# Patient Record
Sex: Male | Born: 1955 | Race: Black or African American | Hispanic: No | Smoking: Current some day smoker
Health system: Southern US, Community
[De-identification: ages and names within clinical notes are randomized; demographics above are authoritative.]

## PROBLEM LIST (undated history)

## (undated) DIAGNOSIS — L309 Dermatitis, unspecified: Secondary | ICD-10-CM

## (undated) DIAGNOSIS — M545 Low back pain, unspecified: Secondary | ICD-10-CM

## (undated) DIAGNOSIS — M2550 Pain in unspecified joint: Secondary | ICD-10-CM

## (undated) DIAGNOSIS — J449 Chronic obstructive pulmonary disease, unspecified: Secondary | ICD-10-CM

## (undated) DIAGNOSIS — R202 Paresthesia of skin: Secondary | ICD-10-CM

## (undated) DIAGNOSIS — IMO0001 Reserved for inherently not codable concepts without codable children: Secondary | ICD-10-CM

## (undated) DIAGNOSIS — G8929 Other chronic pain: Secondary | ICD-10-CM

## (undated) DIAGNOSIS — S86812A Strain of other muscle(s) and tendon(s) at lower leg level, left leg, initial encounter: Secondary | ICD-10-CM

## (undated) DIAGNOSIS — M1990 Unspecified osteoarthritis, unspecified site: Secondary | ICD-10-CM

## (undated) DIAGNOSIS — Z87828 Personal history of other (healed) physical injury and trauma: Secondary | ICD-10-CM

## (undated) DIAGNOSIS — K219 Gastro-esophageal reflux disease without esophagitis: Secondary | ICD-10-CM

## (undated) DIAGNOSIS — R569 Unspecified convulsions: Secondary | ICD-10-CM

## (undated) DIAGNOSIS — E559 Vitamin D deficiency, unspecified: Secondary | ICD-10-CM

## (undated) DIAGNOSIS — Z9981 Dependence on supplemental oxygen: Secondary | ICD-10-CM

## (undated) DIAGNOSIS — I10 Essential (primary) hypertension: Secondary | ICD-10-CM

## (undated) DIAGNOSIS — N529 Male erectile dysfunction, unspecified: Secondary | ICD-10-CM

## (undated) DIAGNOSIS — M79606 Pain in leg, unspecified: Secondary | ICD-10-CM

## (undated) HISTORY — PX: LEG SURGERY: SHX1003

## (undated) HISTORY — PX: SHOULDER SURGERY: SHX246

## (undated) HISTORY — PX: OTHER SURGICAL HISTORY: SHX169

## (undated) HISTORY — PX: WISDOM TOOTH EXTRACTION: SHX21

## (undated) HISTORY — PX: PICC LINE PLACE PERIPHERAL (ARMC HX): HXRAD1248

---

## 2006-03-01 ENCOUNTER — Ambulatory Visit: Payer: Self-pay | Admitting: Family Medicine

## 2006-03-28 ENCOUNTER — Ambulatory Visit: Payer: Self-pay | Admitting: Family Medicine

## 2006-04-08 ENCOUNTER — Emergency Department (HOSPITAL_COMMUNITY): Admission: EM | Admit: 2006-04-08 | Discharge: 2006-04-08 | Payer: Self-pay | Admitting: Emergency Medicine

## 2006-06-06 ENCOUNTER — Encounter: Admission: RE | Admit: 2006-06-06 | Discharge: 2006-06-06 | Payer: Self-pay | Admitting: Family Medicine

## 2006-08-14 ENCOUNTER — Emergency Department (HOSPITAL_COMMUNITY): Admission: EM | Admit: 2006-08-14 | Discharge: 2006-08-14 | Payer: Self-pay | Admitting: Emergency Medicine

## 2006-09-29 ENCOUNTER — Emergency Department (HOSPITAL_COMMUNITY): Admission: EM | Admit: 2006-09-29 | Discharge: 2006-09-30 | Payer: Self-pay | Admitting: Emergency Medicine

## 2006-10-07 ENCOUNTER — Emergency Department (HOSPITAL_COMMUNITY): Admission: EM | Admit: 2006-10-07 | Discharge: 2006-10-07 | Payer: Self-pay | Admitting: Emergency Medicine

## 2006-10-30 ENCOUNTER — Emergency Department (HOSPITAL_COMMUNITY): Admission: EM | Admit: 2006-10-30 | Discharge: 2006-10-30 | Payer: Self-pay | Admitting: Emergency Medicine

## 2006-11-28 ENCOUNTER — Emergency Department (HOSPITAL_COMMUNITY): Admission: EM | Admit: 2006-11-28 | Discharge: 2006-11-28 | Payer: Self-pay | Admitting: Emergency Medicine

## 2007-01-10 ENCOUNTER — Ambulatory Visit: Payer: Self-pay | Admitting: Family Medicine

## 2007-03-18 ENCOUNTER — Emergency Department (HOSPITAL_COMMUNITY): Admission: EM | Admit: 2007-03-18 | Discharge: 2007-03-18 | Payer: Self-pay | Admitting: Emergency Medicine

## 2007-08-05 ENCOUNTER — Emergency Department (HOSPITAL_COMMUNITY): Admission: EM | Admit: 2007-08-05 | Discharge: 2007-08-05 | Payer: Self-pay | Admitting: Emergency Medicine

## 2008-03-03 ENCOUNTER — Emergency Department (HOSPITAL_COMMUNITY): Admission: EM | Admit: 2008-03-03 | Discharge: 2008-03-03 | Payer: Self-pay | Admitting: Emergency Medicine

## 2009-03-20 ENCOUNTER — Emergency Department (HOSPITAL_COMMUNITY): Admission: EM | Admit: 2009-03-20 | Discharge: 2009-03-20 | Payer: Self-pay | Admitting: Emergency Medicine

## 2009-04-07 ENCOUNTER — Emergency Department (HOSPITAL_COMMUNITY): Admission: EM | Admit: 2009-04-07 | Discharge: 2009-04-07 | Payer: Self-pay | Admitting: Emergency Medicine

## 2009-10-12 ENCOUNTER — Ambulatory Visit: Payer: Self-pay | Admitting: Radiology

## 2009-10-12 ENCOUNTER — Emergency Department (HOSPITAL_BASED_OUTPATIENT_CLINIC_OR_DEPARTMENT_OTHER): Admission: EM | Admit: 2009-10-12 | Discharge: 2009-10-12 | Payer: Self-pay | Admitting: Emergency Medicine

## 2010-04-30 LAB — URINALYSIS, ROUTINE W REFLEX MICROSCOPIC
Bilirubin Urine: NEGATIVE
Glucose, UA: NEGATIVE mg/dL
Hgb urine dipstick: NEGATIVE
Ketones, ur: NEGATIVE mg/dL
Nitrite: NEGATIVE
Protein, ur: NEGATIVE mg/dL
Specific Gravity, Urine: 1.016 (ref 1.005–1.030)
Urobilinogen, UA: 1 mg/dL (ref 0.0–1.0)
pH: 7.5 (ref 5.0–8.0)

## 2010-04-30 LAB — POCT CARDIAC MARKERS
CKMB, poc: 2.2 ng/mL (ref 1.0–8.0)
Myoglobin, poc: 46.9 ng/mL (ref 12–200)
Troponin i, poc: <0.05 ng/mL (ref 0.00–0.09)

## 2010-04-30 LAB — POCT TOXICOLOGY PANEL

## 2010-04-30 LAB — BASIC METABOLIC PANEL
BUN: 16 mg/dL (ref 6–23)
CO2: 26 mEq/L (ref 19–32)
Calcium: 9.8 mg/dL (ref 8.4–10.5)
Chloride: 105 mEq/L (ref 96–112)
Creatinine, Ser: 0.8 mg/dL (ref 0.4–1.5)
GFR calc Af Amer: >60 mL/min (ref >60)
GFR calc non Af Amer: >60 mL/min (ref >60)
Glucose, Bld: 89 mg/dL (ref 70–99)
Potassium: 4.7 mEq/L (ref 3.5–5.1)
Sodium: 138 mEq/L (ref 135–145)

## 2010-04-30 LAB — CBC
HCT: 43.6 % (ref 39.0–52.0)
Hemoglobin: 14.9 g/dL (ref 13.0–17.0)
MCH: 33.3 pg (ref 26.0–34.0)
MCHC: 34.2 g/dL (ref 30.0–36.0)
MCV: 97.6 fL (ref 78.0–100.0)
Platelets: 160 10*3/uL (ref 150–400)
RBC: 4.47 MIL/uL (ref 4.22–5.81)
RDW: 13.3 % (ref 11.5–15.5)
WBC: 8 10*3/uL (ref 4.0–10.5)

## 2010-04-30 LAB — PROTIME-INR
INR: 0.91 (ref 0.00–1.49)
Prothrombin Time: 12.5 seconds (ref 11.6–15.2)

## 2010-04-30 LAB — ETHANOL: Alcohol, Ethyl (B): <10 mg/dL (ref 0–10)

## 2010-04-30 LAB — APTT: aPTT: 29 seconds (ref 24–37)

## 2010-05-15 ENCOUNTER — Emergency Department (HOSPITAL_COMMUNITY)
Admission: EM | Admit: 2010-05-15 | Discharge: 2010-05-15 | Disposition: A | Discharge status: Home / Self Care | Payer: PRIVATE HEALTH INSURANCE | Attending: Emergency Medicine | Admitting: Emergency Medicine

## 2010-05-15 ENCOUNTER — Emergency Department (HOSPITAL_COMMUNITY): Payer: PRIVATE HEALTH INSURANCE

## 2010-05-15 DIAGNOSIS — R209 Unspecified disturbances of skin sensation: Secondary | ICD-10-CM | POA: Insufficient documentation

## 2010-05-15 DIAGNOSIS — Y9229 Other specified public building as the place of occurrence of the external cause: Secondary | ICD-10-CM | POA: Insufficient documentation

## 2010-05-15 DIAGNOSIS — S0100XA Unspecified open wound of scalp, initial encounter: Secondary | ICD-10-CM | POA: Insufficient documentation

## 2010-05-15 DIAGNOSIS — S0990XA Unspecified injury of head, initial encounter: Secondary | ICD-10-CM | POA: Insufficient documentation

## 2010-05-15 DIAGNOSIS — W1809XA Striking against other object with subsequent fall, initial encounter: Secondary | ICD-10-CM | POA: Insufficient documentation

## 2010-05-15 DIAGNOSIS — I10 Essential (primary) hypertension: Secondary | ICD-10-CM | POA: Insufficient documentation

## 2010-07-22 ENCOUNTER — Emergency Department (HOSPITAL_COMMUNITY)
Admission: EM | Admit: 2010-07-22 | Discharge: 2010-07-22 | Disposition: A | Discharge status: Home / Self Care | Payer: PRIVATE HEALTH INSURANCE | Attending: Emergency Medicine | Admitting: Emergency Medicine

## 2010-07-22 ENCOUNTER — Emergency Department (HOSPITAL_COMMUNITY): Payer: PRIVATE HEALTH INSURANCE

## 2010-07-22 DIAGNOSIS — R0789 Other chest pain: Secondary | ICD-10-CM | POA: Insufficient documentation

## 2010-07-22 DIAGNOSIS — F17200 Nicotine dependence, unspecified, uncomplicated: Secondary | ICD-10-CM | POA: Insufficient documentation

## 2010-07-22 DIAGNOSIS — M549 Dorsalgia, unspecified: Secondary | ICD-10-CM | POA: Insufficient documentation

## 2010-07-22 DIAGNOSIS — R05 Cough: Secondary | ICD-10-CM | POA: Insufficient documentation

## 2010-07-22 DIAGNOSIS — R0609 Other forms of dyspnea: Secondary | ICD-10-CM | POA: Insufficient documentation

## 2010-07-22 DIAGNOSIS — J45909 Unspecified asthma, uncomplicated: Secondary | ICD-10-CM | POA: Insufficient documentation

## 2010-07-22 DIAGNOSIS — R059 Cough, unspecified: Secondary | ICD-10-CM | POA: Insufficient documentation

## 2010-07-22 DIAGNOSIS — R0602 Shortness of breath: Secondary | ICD-10-CM | POA: Insufficient documentation

## 2010-07-22 DIAGNOSIS — R0989 Other specified symptoms and signs involving the circulatory and respiratory systems: Secondary | ICD-10-CM | POA: Insufficient documentation

## 2010-07-22 DIAGNOSIS — I10 Essential (primary) hypertension: Secondary | ICD-10-CM | POA: Insufficient documentation

## 2010-07-22 LAB — BASIC METABOLIC PANEL
BUN: 9 mg/dL (ref 6–23)
CO2: 26 mEq/L (ref 19–32)
Calcium: 10.1 mg/dL (ref 8.4–10.5)
Chloride: 96 mEq/L (ref 96–112)
Creatinine, Ser: 0.78 mg/dL (ref 0.4–1.5)
GFR calc Af Amer: >60 mL/min (ref >60)
GFR calc non Af Amer: >60 mL/min (ref >60)
Glucose, Bld: 104 mg/dL — ABNORMAL HIGH (ref 70–99)
Potassium: 3.6 mEq/L (ref 3.5–5.1)
Sodium: 133 mEq/L — ABNORMAL LOW (ref 135–145)

## 2010-07-22 LAB — DIFFERENTIAL
Basophils Absolute: 0 10*3/uL (ref 0.0–0.1)
Basophils Relative: 0 % (ref 0–1)
Eosinophils Absolute: 0.3 10*3/uL (ref 0.0–0.7)
Eosinophils Relative: 3 % (ref 0–5)
Lymphocytes Relative: 15 % (ref 12–46)
Lymphs Abs: 1.4 10*3/uL (ref 0.7–4.0)
Monocytes Absolute: 0.8 10*3/uL (ref 0.1–1.0)
Monocytes Relative: 8 % (ref 3–12)
Neutro Abs: 6.7 10*3/uL (ref 1.7–7.7)
Neutrophils Relative %: 73 % (ref 43–77)

## 2010-07-22 LAB — CBC
HCT: 46.5 % (ref 39.0–52.0)
Hemoglobin: 15.9 g/dL (ref 13.0–17.0)
MCH: 32.1 pg (ref 26.0–34.0)
MCHC: 34.2 g/dL (ref 30.0–36.0)
MCV: 93.9 fL (ref 78.0–100.0)
Platelets: 183 10*3/uL (ref 150–400)
RBC: 4.95 MIL/uL (ref 4.22–5.81)
RDW: 13.7 % (ref 11.5–15.5)
WBC: 9.2 10*3/uL (ref 4.0–10.5)

## 2010-07-22 LAB — POCT I-STAT 3, VENOUS BLOOD GAS (G3P V)
Acid-base deficit: 1 mmol/L (ref 0.0–2.0)
Bicarbonate: 23.8 mEq/L (ref 20.0–24.0)
O2 Saturation: 89 %
TCO2: 25 mmol/L (ref 0–100)
pCO2, Ven: 37.8 mmHg — ABNORMAL LOW (ref 45.0–50.0)
pH, Ven: 7.407 — ABNORMAL HIGH (ref 7.250–7.300)
pO2, Ven: 55 mmHg — ABNORMAL HIGH (ref 30.0–45.0)

## 2010-09-25 ENCOUNTER — Emergency Department (HOSPITAL_COMMUNITY): Payer: PRIVATE HEALTH INSURANCE

## 2010-09-25 ENCOUNTER — Emergency Department (HOSPITAL_COMMUNITY)
Admission: EM | Admit: 2010-09-25 | Discharge: 2010-09-25 | Disposition: A | Discharge status: Home / Self Care | Payer: PRIVATE HEALTH INSURANCE | Attending: Emergency Medicine | Admitting: Emergency Medicine

## 2010-09-25 DIAGNOSIS — R079 Chest pain, unspecified: Secondary | ICD-10-CM | POA: Insufficient documentation

## 2010-09-25 DIAGNOSIS — J40 Bronchitis, not specified as acute or chronic: Secondary | ICD-10-CM | POA: Insufficient documentation

## 2010-09-25 DIAGNOSIS — J45909 Unspecified asthma, uncomplicated: Secondary | ICD-10-CM | POA: Insufficient documentation

## 2010-09-25 DIAGNOSIS — R05 Cough: Secondary | ICD-10-CM | POA: Insufficient documentation

## 2010-09-25 DIAGNOSIS — M7989 Other specified soft tissue disorders: Secondary | ICD-10-CM | POA: Insufficient documentation

## 2010-09-25 DIAGNOSIS — I10 Essential (primary) hypertension: Secondary | ICD-10-CM | POA: Insufficient documentation

## 2010-09-25 DIAGNOSIS — F17200 Nicotine dependence, unspecified, uncomplicated: Secondary | ICD-10-CM | POA: Insufficient documentation

## 2010-09-25 DIAGNOSIS — R059 Cough, unspecified: Secondary | ICD-10-CM | POA: Insufficient documentation

## 2010-09-25 DIAGNOSIS — M79609 Pain in unspecified limb: Secondary | ICD-10-CM | POA: Insufficient documentation

## 2010-11-26 LAB — I-STAT 8, (EC8 V) (CONVERTED LAB)
Acid-base deficit: 3 — ABNORMAL HIGH
BUN: 9
Bicarbonate: 21.9
Chloride: 104
Glucose, Bld: 128 — ABNORMAL HIGH
HCT: 48
Hemoglobin: 16.3
Operator id: 192351
Potassium: 3.8
Sodium: 136
TCO2: 23
pCO2, Ven: 38.3 — ABNORMAL LOW
pH, Ven: 7.366 — ABNORMAL HIGH

## 2010-11-26 LAB — ETHANOL: Alcohol, Ethyl (B): 191 — ABNORMAL HIGH

## 2010-11-26 LAB — POCT I-STAT CREATININE
Creatinine, Ser: 0.8
Operator id: 192351

## 2010-12-20 ENCOUNTER — Ambulatory Visit: Payer: Medicare Other | Admitting: Physical Medicine & Rehabilitation

## 2010-12-20 ENCOUNTER — Encounter: Payer: Medicare Other | Attending: Physical Medicine & Rehabilitation

## 2011-02-15 ENCOUNTER — Encounter: Payer: Self-pay | Admitting: *Deleted

## 2011-02-15 ENCOUNTER — Emergency Department (HOSPITAL_COMMUNITY): Payer: PRIVATE HEALTH INSURANCE

## 2011-02-15 ENCOUNTER — Emergency Department (HOSPITAL_COMMUNITY)
Admission: EM | Admit: 2011-02-15 | Discharge: 2011-02-15 | Disposition: A | Discharge status: Home / Self Care | Payer: PRIVATE HEALTH INSURANCE | Attending: Emergency Medicine | Admitting: Emergency Medicine

## 2011-02-15 ENCOUNTER — Other Ambulatory Visit: Payer: Self-pay

## 2011-02-15 DIAGNOSIS — S0100XA Unspecified open wound of scalp, initial encounter: Secondary | ICD-10-CM | POA: Insufficient documentation

## 2011-02-15 DIAGNOSIS — IMO0002 Reserved for concepts with insufficient information to code with codable children: Secondary | ICD-10-CM | POA: Insufficient documentation

## 2011-02-15 DIAGNOSIS — S0101XA Laceration without foreign body of scalp, initial encounter: Secondary | ICD-10-CM

## 2011-02-15 DIAGNOSIS — F10929 Alcohol use, unspecified with intoxication, unspecified: Secondary | ICD-10-CM

## 2011-02-15 DIAGNOSIS — I498 Other specified cardiac arrhythmias: Secondary | ICD-10-CM | POA: Insufficient documentation

## 2011-02-15 DIAGNOSIS — R51 Headache: Secondary | ICD-10-CM | POA: Insufficient documentation

## 2011-02-15 DIAGNOSIS — W19XXXA Unspecified fall, initial encounter: Secondary | ICD-10-CM

## 2011-02-15 DIAGNOSIS — W108XXA Fall (on) (from) other stairs and steps, initial encounter: Secondary | ICD-10-CM | POA: Insufficient documentation

## 2011-02-15 HISTORY — DX: Other chronic pain: G89.29

## 2011-02-15 HISTORY — DX: Pain in leg, unspecified: M79.606

## 2011-02-15 LAB — URINALYSIS, ROUTINE W REFLEX MICROSCOPIC
Bilirubin Urine: NEGATIVE
Glucose, UA: NEGATIVE mg/dL
Hgb urine dipstick: NEGATIVE
Ketones, ur: NEGATIVE mg/dL
Leukocytes, UA: NEGATIVE
Nitrite: NEGATIVE
Protein, ur: NEGATIVE mg/dL
Specific Gravity, Urine: 1.012 (ref 1.005–1.030)
Urobilinogen, UA: 0.2 mg/dL (ref 0.0–1.0)
pH: 6 (ref 5.0–8.0)

## 2011-02-15 LAB — COMPREHENSIVE METABOLIC PANEL
ALT: 15 U/L (ref 0–53)
AST: 27 U/L (ref 0–37)
Albumin: 3.8 g/dL (ref 3.5–5.2)
Alkaline Phosphatase: 105 U/L (ref 39–117)
BUN: 9 mg/dL (ref 6–23)
CO2: 20 mEq/L (ref 19–32)
Calcium: 9.2 mg/dL (ref 8.4–10.5)
Chloride: 105 mEq/L (ref 96–112)
Creatinine, Ser: 0.8 mg/dL (ref 0.50–1.35)
GFR calc Af Amer: >90 mL/min (ref >90)
GFR calc non Af Amer: >90 mL/min (ref >90)
Glucose, Bld: 79 mg/dL (ref 70–99)
Potassium: 4.3 mEq/L (ref 3.5–5.1)
Sodium: 139 mEq/L (ref 135–145)
Total Bilirubin: 0.2 mg/dL — ABNORMAL LOW (ref 0.3–1.2)
Total Protein: 7.2 g/dL (ref 6.0–8.3)

## 2011-02-15 LAB — RAPID URINE DRUG SCREEN, HOSP PERFORMED
Amphetamines: NOT DETECTED
Barbiturates: NOT DETECTED
Benzodiazepines: NOT DETECTED
Cocaine: NOT DETECTED
Opiates: POSITIVE — AB
Tetrahydrocannabinol: POSITIVE — AB

## 2011-02-15 LAB — ETHANOL: Alcohol, Ethyl (B): 198 mg/dL — ABNORMAL HIGH (ref 0–11)

## 2011-02-15 MED ORDER — MORPHINE SULFATE 4 MG/ML IJ SOLN
4.0000 mg | Freq: Once | INTRAMUSCULAR | Status: AC
  Administered 2011-02-15: 4 mg via INTRAVENOUS
  Filled 2011-02-15: qty 1

## 2011-02-15 MED ORDER — HYDROCODONE-ACETAMINOPHEN 5-325 MG PO TABS: 1.0000 | ORAL_TABLET | ORAL | Status: AC | PRN

## 2011-02-15 MED ORDER — LORAZEPAM 2 MG/ML IJ SOLN
1.0000 mg | Freq: Once | INTRAMUSCULAR | Status: AC
  Administered 2011-02-15: 2 mg via INTRAVENOUS
  Filled 2011-02-15: qty 1

## 2011-02-15 MED ORDER — SODIUM CHLORIDE 0.9 % IV SOLN
INTRAVENOUS | Status: DC
  Administered 2011-02-15: 16:00:00 via INTRAVENOUS

## 2011-02-15 NOTE — ED Notes (Signed)
Returned from radiology. 

## 2011-02-15 NOTE — ED Notes (Signed)
edp at bedside suturing pt's laceration

## 2011-02-15 NOTE — ED Notes (Signed)
Patient transported to X-ray 
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
## 2011-02-15 NOTE — ED Notes (Signed)
Pt fell down stairs at apt complex. Denies loc. ETOH present. Pt presents with laceration to posterior portion of head. Bleeding controlled with gauze. Pt cooperating presently with plan of care.

## 2011-02-15 NOTE — ED Notes (Signed)
Pt ambulated the length of POD A unit gait steady with cane pt states hx of foot injury but gait was steady

## 2011-02-15 NOTE — ED Notes (Signed)
Pt fell down stairs at apt complex, denies loc. Presents with laceration to back of head. Pt smells of ETOH and admits to have been drinking since last pm.

## 2011-02-15 NOTE — ED Notes (Signed)
Patient transported to CT 

## 2011-02-15 NOTE — ED Notes (Signed)
Attempted to collect urine from patient. Patient stated he was unable to urinate at this time.

## 2011-02-15 NOTE — ED Provider Notes (Signed)
LACERATION REPAIR Performed by: Otilio Miu Authorized by: Otilio Miu Consent: Verbal consent obtained. Risks and benefits: risks, benefits and alternatives were discussed Consent given by: patient Patient identity confirmed: provided demographic data Prepped and Draped in normal sterile fashion Wound explored  Laceration Location: right occipital scalp  Laceration Length: 5cm  No Foreign Bodies seen or palpated  Anesthesia: none  Local anesthetic:none  Irrigation method: syringe Amount of cleaning: standard  Skin closure: five staples  Patient tolerance: Patient tolerated the procedure well with no immediate complications.   Arie Sabina Montura, PA 02/15/11 1930     Carleene Cooper III, MD 02/15/11 [phone]

## 2011-02-15 NOTE — ED Notes (Signed)
Suture cart at bedside 

## 2011-03-15 ENCOUNTER — Emergency Department (HOSPITAL_COMMUNITY)
Admission: EM | Admit: 2011-03-15 | Discharge: 2011-03-15 | Disposition: A | Discharge status: Home / Self Care | Payer: PRIVATE HEALTH INSURANCE | Attending: Emergency Medicine | Admitting: Emergency Medicine

## 2011-03-15 ENCOUNTER — Emergency Department (HOSPITAL_COMMUNITY): Payer: PRIVATE HEALTH INSURANCE

## 2011-03-15 ENCOUNTER — Encounter (HOSPITAL_COMMUNITY): Payer: Self-pay | Admitting: Emergency Medicine

## 2011-03-15 DIAGNOSIS — M545 Low back pain, unspecified: Secondary | ICD-10-CM | POA: Insufficient documentation

## 2011-03-15 DIAGNOSIS — W108XXA Fall (on) (from) other stairs and steps, initial encounter: Secondary | ICD-10-CM | POA: Insufficient documentation

## 2011-03-15 DIAGNOSIS — H538 Other visual disturbances: Secondary | ICD-10-CM | POA: Insufficient documentation

## 2011-03-15 DIAGNOSIS — R51 Headache: Secondary | ICD-10-CM | POA: Insufficient documentation

## 2011-03-15 HISTORY — DX: Essential (primary) hypertension: I10

## 2011-03-15 MED ORDER — HYDROCODONE-ACETAMINOPHEN 5-325 MG PO TABS: ORAL_TABLET | ORAL | Status: AC

## 2011-03-15 MED ORDER — HYDROCODONE-ACETAMINOPHEN 5-325 MG PO TABS
1.0000 | ORAL_TABLET | Freq: Once | ORAL | Status: AC
  Administered 2011-03-15: 1 via ORAL
  Filled 2011-03-15: qty 1

## 2011-03-15 NOTE — ED Provider Notes (Signed)
History     CSN: [id number]  Arrival date & time 03/15/11  2440   First MD Initiated Contact with Patient 03/15/11 [phone]      Chief Complaint  Patient presents with  . Headache  . Back Pain    (Consider location/radiation/quality/duration/timing/severity/associated sxs/prior treatment) HPI Comments: Patient presents with persistent headache and lower back pain since falling down a flight of stairs on 02/15/2011. Patient was seen in the ED at that time and had a negative head and neck CT. Patient had a laceration to the back of his head that was repaired with surgical staples. Patient continues to have headache that is improved with Vicodin. The headache is not worsening. The headache is worse with sitting and decreased with lying flat. The patient had an episode of blurry vision last night but not currently. Patient denies nausea or vomiting. Patient denies new weakness/numbness/tingling in extremities. Patient states that his gait is at baseline. Patient's back pain is in his lumbar spine. States occasional radiation into right leg. No bowel or bladder incontinence.  Patient is a 56 y.o. male presenting with headaches. The history is provided by the patient.  Headache  This is a chronic problem. The current episode started more than 1 week ago. The problem occurs constantly. The headache is associated with nothing. The pain is located in the occipital region. The quality of the pain is described as dull. The pain is mild. The pain does not radiate. Pertinent negatives include no fever, no near-syncope, no shortness of breath, no nausea and no vomiting. He has tried oral narcotic analgesics for the symptoms. The treatment provided significant relief.    Past Medical History  Diagnosis Date  . Chronic leg pain     No past surgical history on file.  No family history on file.  History  Substance Use Topics  . Smoking status: Not on file  . Smokeless tobacco: Not on file  . Alcohol  Use: Yes      Review of Systems  Constitutional: Negative for fever and fatigue.  HENT: Negative for neck pain and tinnitus.   Eyes: Positive for visual disturbance. Negative for photophobia and pain.  Respiratory: Negative for shortness of breath.   Cardiovascular: Negative for chest pain and near-syncope.  Gastrointestinal: Negative for nausea and vomiting.       Negative for fecal incontinence  Genitourinary: Negative for difficulty urinating.       Negative for urinary retention or incontinence  Musculoskeletal: Positive for back pain. Negative for gait problem.  Skin: Negative for wound.  Neurological: Positive for headaches. Negative for dizziness, weakness, light-headedness and numbness.  Psychiatric/Behavioral: Negative for confusion and decreased concentration.    Allergies  Review of patient's allergies indicates no known allergies.  Home Medications   Current Outpatient Rx  Name Route Sig Dispense Refill  . ACETAMINOPHEN 500 MG PO TABS Oral Take 1,000 mg by mouth every 6 (six) hours as needed. For pain.    Marland Kitchen HYDROCODONE-ACETAMINOPHEN 5-325 MG PO TABS Oral Take 1 tablet by mouth every 4 (four) hours as needed. For pain    . PRESCRIPTION MEDICATION Oral Take 1 tablet by mouth every morning. For blood pressure      BP 115/83  Pulse 76  Temp(Src) 97.7 F (36.5 C) (Oral)  Resp 18  SpO2 98%  Physical Exam  Nursing note and vitals reviewed. Constitutional: He is oriented to person, place, and time. He appears well-developed and well-nourished.  HENT:  Head: Normocephalic and atraumatic.  Right  Ear: External ear normal.  Left Ear: External ear normal.  Nose: Nose normal.  Mouth/Throat: Uvula is midline, oropharynx is clear and moist and mucous membranes are normal.       Well-healing laceration to occiput.  Eyes: Conjunctivae are normal. Pupils are equal, round, and reactive to light. Right eye exhibits no discharge. Left eye exhibits no discharge.  Neck: Normal  range of motion. Neck supple.       Full ROM in neck.   Cardiovascular: Normal rate, regular rhythm and normal heart sounds.   Pulmonary/Chest: Effort normal and breath sounds normal.  Abdominal: Soft. Bowel sounds are normal. There is no tenderness. There is no rebound and no guarding.  Musculoskeletal: He exhibits no edema.  Neurological: He is alert and oriented to person, place, and time. He has normal strength. No cranial nerve deficit or sensory deficit. Coordination and gait normal. GCS eye subscore is 4. GCS verbal subscore is 5. GCS motor subscore is 6.       Patient walks with limp and cane due to previous injury however it is at baseline for him. Patient has 4/5 strength in left hip and knee he states is due to previous injury. Strength at baseline.   Skin: Skin is warm and dry.  Psychiatric: He has a normal mood and affect.    ED Course  Procedures (including critical care time)  Labs Reviewed - No data to display Ct Head Wo Contrast  03/15/2011  *RADIOLOGY REPORT*  Clinical Data: Headache, back pain, vision problems, recent fall  CT HEAD WITHOUT CONTRAST  Technique:  Contiguous axial images were obtained from the base of the skull through the vertex without contrast.  Comparison: None.  Findings: No acute intracranial hemorrhage.  No focal mass lesion. No CT evidence of acute infarction.   No midline shift or mass effect.  No hydrocephalus.  Basilar cisterns are patent.  There is encephalomalacia within the paramedian frontal lobe is unchanged from prior. Paranasal sinuses and mastoid air cells are clear. Orbits are normal.  IMPRESSION:  1.  No acute intracranial findings. 2.  Remote anterior cerebral artery infarctions  Original Report Authenticated By: Genevive Bi, M.D.     1. Headache   2. Low back pain     8:21 AM Patient seen and examined.  8:21 AM Patient was discussed with and seen with Hilario Quarry, MD. Will order head CT.   10:22 AM CT scan negative. Patient  informed. Patient states that his back pain is improved after pain medicine. Patient counseled on post concussive syndrome. Urged followup with his primary care doctor in the next 3 days. Patient verbalizes understanding and agrees with plan.  Patient was counseled on back pain precautions and told to do activity as tolerated but do not lift, push, or pull heavy objects more than 10 pounds.  Patient counseled to use ice or heat on back for no longer than 15 minutes every hour.  Questions answered.  Patient verbalized understanding.    Patient counseled on use of narcotic pain medications. Counseled not to combine these medications with others containing tylenol. Urged not to drink alcohol, drive, or perform any other activities that requires focus while taking these medications. The patient verbalizes understanding and agrees with the plan.      MDM  Patient with headache and back pain after head injury 4 weeks ago. Repeat CT of head is negative for bleeding. Suspect headache may be associated with post concussive syndrome. Patient otherwise has a normal  neurological exam. Back pain is likely also related to the fall. No red flag signs and symptoms of lower back pain. Patient is ambulatory per baseline and I do not suspect any fracture. Pain control and rest indicated for back pain. Patient has PCP follow up.        Carolee Rota, [place] 03/15/11 1024
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
## 2011-03-15 NOTE — ED Notes (Signed)
Pt here c/o H/A, back pain, and (vision problems at times) since previous fall on 02-18-11.

## 2011-03-20 NOTE — ED Provider Notes (Signed)
History/physical exam/procedure(s) were performed by non-physician practitioner and as supervising physician I was immediately available for consultation/collaboration. I have reviewed all notes and am in agreement with care and plan.   Hilario Quarry, MD 03/20/11 0230

## 2011-03-30 ENCOUNTER — Encounter (HOSPITAL_COMMUNITY): Payer: Self-pay

## 2011-03-30 ENCOUNTER — Emergency Department (HOSPITAL_COMMUNITY): Payer: PRIVATE HEALTH INSURANCE

## 2011-03-30 ENCOUNTER — Emergency Department (HOSPITAL_COMMUNITY)
Admission: EM | Admit: 2011-03-30 | Discharge: 2011-03-30 | Disposition: A | Discharge status: Home / Self Care | Payer: PRIVATE HEALTH INSURANCE | Attending: Emergency Medicine | Admitting: Emergency Medicine

## 2011-03-30 DIAGNOSIS — M2540 Effusion, unspecified joint: Secondary | ICD-10-CM | POA: Insufficient documentation

## 2011-03-30 DIAGNOSIS — M545 Low back pain, unspecified: Secondary | ICD-10-CM | POA: Insufficient documentation

## 2011-03-30 DIAGNOSIS — F17200 Nicotine dependence, unspecified, uncomplicated: Secondary | ICD-10-CM | POA: Insufficient documentation

## 2011-03-30 DIAGNOSIS — Z79899 Other long term (current) drug therapy: Secondary | ICD-10-CM | POA: Insufficient documentation

## 2011-03-30 DIAGNOSIS — I10 Essential (primary) hypertension: Secondary | ICD-10-CM | POA: Insufficient documentation

## 2011-03-30 DIAGNOSIS — S93609A Unspecified sprain of unspecified foot, initial encounter: Secondary | ICD-10-CM | POA: Insufficient documentation

## 2011-03-30 DIAGNOSIS — W19XXXA Unspecified fall, initial encounter: Secondary | ICD-10-CM | POA: Insufficient documentation

## 2011-03-30 DIAGNOSIS — M7989 Other specified soft tissue disorders: Secondary | ICD-10-CM | POA: Insufficient documentation

## 2011-03-30 DIAGNOSIS — M79609 Pain in unspecified limb: Secondary | ICD-10-CM | POA: Insufficient documentation

## 2011-03-30 MED ORDER — HYDROCODONE-ACETAMINOPHEN 5-500 MG PO TABS: 1.0000 | ORAL_TABLET | Freq: Four times a day (QID) | ORAL | Status: AC | PRN

## 2011-03-30 MED ORDER — HYDROCODONE-ACETAMINOPHEN 5-325 MG PO TABS
1.0000 | ORAL_TABLET | Freq: Once | ORAL | Status: AC
  Administered 2011-03-30: 1 via ORAL
  Filled 2011-03-30: qty 1

## 2011-03-30 MED ORDER — IBUPROFEN 600 MG PO TABS: 600.0000 mg | ORAL_TABLET | Freq: Four times a day (QID) | ORAL | Status: AC | PRN

## 2011-03-30 NOTE — Progress Notes (Signed)
Orthopedic Tech Progress Note Patient Details:  [name] December 10, 1955 [id number]  Splint Location: patient refused crutches. per nurse, applied Ace Wrap to left ankle for support. Splint Interventions: Application    Gaye Pollack 03/30/2011, 10:54 AM

## 2011-03-30 NOTE — ED Notes (Addendum)
Pt presented to the ER with c/o lt foot pain. Pt claimed that he is unable to bear weight.

## 2011-03-30 NOTE — ED Notes (Signed)
Patient presents with left leg and foot pain since his fall 1 month ago.  Swelling present to lateral and anterior side of foot with strong pedal pulses present.  Patient ambulatory in department but with limp present.  Patient reports he walks a quite often for far distances.

## 2011-03-30 NOTE — Discharge Instructions (Signed)
Your x-ray today did not show any bony fractures. I suspect your pain is due to a sprain. Keep your leg elevated. Stay off of it as much as possible. Ice several times a day. Take ibuprofen for pain. Take vicodin as prescribed as needed for severe pain. Follow up with orthopedics specialist if pain continues.   Foot Sprain The muscles and cord like structures which attach muscle to bone (tendons) that surround the feet are made up of units. A foot sprain can occur at the weakest spot in any of these units. This condition is most often caused by injury to or overuse of the foot, as from playing contact sports, or aggravating a previous injury, or from poor conditioning, or obesity. SYMPTOMS  Pain with movement of the foot.   Tenderness and swelling at the injury site.   Loss of strength is present in moderate or severe sprains.  THE THREE GRADES OR SEVERITY OF FOOT SPRAIN ARE:  Mild (Grade I): Slightly pulled muscle without tearing of muscle or tendon fibers or loss of strength.   Moderate (Grade II): Tearing of fibers in a muscle, tendon, or at the attachment to bone, with small decrease in strength.   Severe (Grade III): Rupture of the muscle-tendon-bone attachment, with separation of fibers. Severe sprain requires surgical repair. Often repeating (chronic) sprains are caused by overuse. Sudden (acute) sprains are caused by direct injury or over-use.  DIAGNOSIS  Diagnosis of this condition is usually by your own observation. If problems continue, a caregiver may be required for further evaluation and treatment. X-rays may be required to make sure there are not breaks in the bones (fractures) present. Continued problems may require physical therapy for treatment. PREVENTION  Use strength and conditioning exercises appropriate for your sport.   Warm up properly prior to working out.   Use athletic shoes that are made for the sport you are participating in.   Allow adequate time for  healing. Early return to activities makes repeat injury more likely, and can lead to an unstable arthritic foot that can result in prolonged disability. Mild sprains generally heal in 3 to 10 days, with moderate and severe sprains taking 2 to 10 weeks. Your caregiver can help you determine the proper time required for healing.  HOME CARE INSTRUCTIONS   Apply ice to the injury for 15 to 20 minutes, 3 to 4 times per day. Put the ice in a plastic bag and place a towel between the bag of ice and your skin.   An elastic wrap (like an Ace bandage) may be used to keep swelling down.   Keep foot above the level of the heart, or at least raised on a footstool, when swelling and pain are present.   Try to avoid use other than gentle range of motion while the foot is painful. Do not resume use until instructed by your caregiver. Then begin use gradually, not increasing use to the point of pain. If pain does develop, decrease use and continue the above measures, gradually increasing activities that do not cause discomfort, until you gradually achieve normal use.   Use crutches if and as instructed, and for the length of time instructed.   Keep injured foot and ankle wrapped between treatments.   Massage foot and ankle for comfort and to keep swelling down. Massage from the toes up towards the knee.   Only take over-the-counter or prescription medicines for pain, discomfort, or fever as directed by your caregiver.  SEEK IMMEDIATE MEDICAL  CARE IF:   Your pain and swelling increase, or pain is not controlled with medications.   You have loss of feeling in your foot or your foot turns cold or blue.   You develop new, unexplained symptoms, or an increase of the symptoms that brought you to your caregiver.  MAKE SURE YOU:   Understand these instructions.   Will watch your condition.   Will get help right away if you are not doing well or get worse.  Document Released: 07/23/2001 Document Revised:  10/13/2010 Document Reviewed: 09/20/2007 [facility] Patient Information 2012 [place], [place].
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
## 2011-03-30 NOTE — ED Provider Notes (Signed)
Medical screening examination/treatment/procedure(s) were performed by non-physician practitioner and as supervising physician I was immediately available for consultation/collaboration.  Toy Baker, MD 03/30/11 2031

## 2011-06-15 ENCOUNTER — Emergency Department (HOSPITAL_COMMUNITY)
Admission: EM | Admit: 2011-06-15 | Discharge: 2011-06-15 | Disposition: A | Discharge status: Home / Self Care | Payer: PRIVATE HEALTH INSURANCE | Attending: Emergency Medicine | Admitting: Emergency Medicine

## 2011-06-15 ENCOUNTER — Encounter (HOSPITAL_COMMUNITY): Payer: Self-pay | Admitting: Emergency Medicine

## 2011-06-15 DIAGNOSIS — S0180XA Unspecified open wound of other part of head, initial encounter: Secondary | ICD-10-CM | POA: Insufficient documentation

## 2011-06-15 DIAGNOSIS — J45909 Unspecified asthma, uncomplicated: Secondary | ICD-10-CM | POA: Insufficient documentation

## 2011-06-15 DIAGNOSIS — S01111A Laceration without foreign body of right eyelid and periocular area, initial encounter: Secondary | ICD-10-CM

## 2011-06-15 DIAGNOSIS — I10 Essential (primary) hypertension: Secondary | ICD-10-CM | POA: Insufficient documentation

## 2011-06-15 DIAGNOSIS — M79609 Pain in unspecified limb: Secondary | ICD-10-CM | POA: Insufficient documentation

## 2011-06-15 DIAGNOSIS — F17200 Nicotine dependence, unspecified, uncomplicated: Secondary | ICD-10-CM | POA: Insufficient documentation

## 2011-06-15 DIAGNOSIS — G8929 Other chronic pain: Secondary | ICD-10-CM | POA: Insufficient documentation

## 2011-06-15 DIAGNOSIS — Z23 Encounter for immunization: Secondary | ICD-10-CM | POA: Insufficient documentation

## 2011-06-15 DIAGNOSIS — W19XXXA Unspecified fall, initial encounter: Secondary | ICD-10-CM

## 2011-06-15 DIAGNOSIS — R296 Repeated falls: Secondary | ICD-10-CM | POA: Insufficient documentation

## 2011-06-15 MED ORDER — TRAMADOL HCL 50 MG PO TABS: 50.0000 mg | ORAL_TABLET | Freq: Four times a day (QID) | ORAL | Status: AC | PRN

## 2011-06-15 MED ORDER — TETANUS-DIPHTH-ACELL PERTUSSIS 5-2.5-18.5 LF-MCG/0.5 IM SUSP
0.5000 mL | Freq: Once | INTRAMUSCULAR | Status: AC
  Administered 2011-06-15: 0.5 mL via INTRAMUSCULAR
  Filled 2011-06-15: qty 0.5

## 2011-06-15 MED ORDER — METHOCARBAMOL 500 MG PO TABS: 500.0000 mg | ORAL_TABLET | Freq: Two times a day (BID) | ORAL | Status: AC

## 2011-06-15 MED ORDER — LIDOCAINE-EPINEPHRINE (PF) 2 %-1:200000 IJ SOLN
10.0000 mL | Freq: Once | INTRAMUSCULAR | Status: AC
  Administered 2011-06-15: 10 mL
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
## 2011-06-15 NOTE — ED Notes (Signed)
C/o back pain, laceration after fall 14 hrs ago..Pt reports chronic weakness in l/leg. Pt stumbled when he stepped ito his house. He struck the r/side of his face on floor. 1.5cm laceration on outer side of r/eyebrow. Bleeding controlled. Also c/o 3 week hx of persistant moist cough. Faint end expiratory wheeze noted bilaterally

## 2011-06-15 NOTE — ED Provider Notes (Signed)
Medical screening examination/treatment/procedure(s) were performed by non-physician practitioner and as supervising physician I was immediately available for consultation/collaboration.   Celene Kras, MD 06/15/11 [phone]

## 2011-06-15 NOTE — Discharge Instructions (Signed)
Facial Laceration  A facial laceration is a cut on the face. Lacerations usually heal quickly, but they need special care to reduce scarring. It will take 1 to 2 years for the scar to lose its redness and to heal completely.  TREATMENT   Some facial lacerations may not require closure. Some lacerations may not be able to be closed due to an increased risk of infection. It is important to see your caregiver as soon as possible after an injury to minimize the risk of infection and to maximize the opportunity for successful closure.  If closure is appropriate, pain medicines may be given, if needed. The wound will be cleaned to help prevent infection. Your caregiver will use stitches (sutures), staples, wound glue (adhesive), or skin adhesive strips to repair the laceration. These tools bring the skin edges together to allow for faster healing and a better cosmetic outcome. However, all wounds will heal with a scar.   Once the wound has healed, scarring can be minimized by covering the wound with sunscreen during the day for 1 full year. Use a sunscreen with an SPF of at least 30. Sunscreen helps to reduce the pigment that will form in the scar. When applying sunscreen to a completely healed wound, massage the scar for a few minutes to help reduce the appearance of the scar. Use circular motions with your fingertips, on and around the scar. Do not massage a healing wound.  HOME CARE INSTRUCTIONS  For sutures:   Keep the wound clean and dry.   If you were given a bandage (dressing), you should change it at least once a day. Also change the dressing if it becomes wet or dirty, or as directed by your caregiver.   Wash the wound with soap and water 2 times a day. Rinse the wound off with water to remove all soap. Pat the wound dry with a clean towel.   After cleaning, apply a thin layer of the antibiotic ointment recommended by your caregiver. This will help prevent infection and keep the dressing from sticking.   You  may shower as usual after the first 24 hours. Do not soak the wound in water until the sutures are removed.   Only take over-the-counter or prescription medicines for pain, discomfort, or fever as directed by your caregiver.   Get your sutures removed as directed by your caregiver. With facial lacerations, sutures should usually be taken out after 4 to 5 days to avoid stitch marks.   Wait a few days after your sutures are removed before applying makeup.  For skin adhesive strips:   Keep the wound clean and dry.   Do not get the skin adhesive strips wet. You may bathe carefully, using caution to keep the wound dry.   If the wound gets wet, pat it dry with a clean towel.   Skin adhesive strips will fall off on their own. You may trim the strips as the wound heals. Do not remove skin adhesive strips that are still stuck to the wound. They will fall off in time.  For wound adhesive:   You may briefly wet your wound in the shower or bath. Do not soak or scrub the wound. Do not swim. Avoid periods of heavy perspiration until the skin adhesive has fallen off on its own. After showering or bathing, gently pat the wound dry with a clean towel.   Do not apply liquid medicine, cream medicine, ointment medicine, or makeup to your wound while the   skin adhesive is in place. This may loosen the film before your wound is healed.   If a dressing is placed over the wound, be careful not to apply tape directly over the skin adhesive. This may cause the adhesive to be pulled off before the wound is healed.   Avoid prolonged exposure to sunlight or tanning lamps while the skin adhesive is in place. Exposure to ultraviolet light in the first year will darken the scar.   The skin adhesive will usually remain in place for 5 to 10 days, then naturally fall off the skin. Do not pick at the adhesive film.  You may need a tetanus shot if:   You cannot remember when you had your last tetanus shot.   You have never had a tetanus  shot.  If you get a tetanus shot, your arm may swell, get red, and feel warm to the touch. This is common and not a problem. If you need a tetanus shot and you choose not to have one, there is a rare chance of getting tetanus. Sickness from tetanus can be serious.  SEEK IMMEDIATE MEDICAL CARE IF:   You develop redness, pain, or swelling around the wound.   There is yellowish-white fluid (pus) coming from the wound.   You develop chills or a fever.  MAKE SURE YOU:   Understand these instructions.   Will watch your condition.   Will get help right away if you are not doing well or get worse.  Document Released: 03/10/2004 Document Revised: 01/20/2011 Document Reviewed: 07/26/2010  [employer] Patient Information 2012 [employer].

## 2012-02-03 ENCOUNTER — Encounter (HOSPITAL_COMMUNITY): Payer: Self-pay | Admitting: Physical Medicine and Rehabilitation

## 2012-02-03 ENCOUNTER — Other Ambulatory Visit: Payer: Self-pay

## 2012-02-03 ENCOUNTER — Emergency Department (HOSPITAL_COMMUNITY)
Admission: EM | Admit: 2012-02-03 | Discharge: 2012-02-03 | Disposition: A | Discharge status: Home / Self Care | Payer: PRIVATE HEALTH INSURANCE | Attending: Emergency Medicine | Admitting: Emergency Medicine

## 2012-02-03 ENCOUNTER — Emergency Department (HOSPITAL_COMMUNITY): Payer: PRIVATE HEALTH INSURANCE

## 2012-02-03 DIAGNOSIS — R05 Cough: Secondary | ICD-10-CM | POA: Insufficient documentation

## 2012-02-03 DIAGNOSIS — M79609 Pain in unspecified limb: Secondary | ICD-10-CM | POA: Insufficient documentation

## 2012-02-03 DIAGNOSIS — I10 Essential (primary) hypertension: Secondary | ICD-10-CM | POA: Insufficient documentation

## 2012-02-03 DIAGNOSIS — G8929 Other chronic pain: Secondary | ICD-10-CM | POA: Insufficient documentation

## 2012-02-03 DIAGNOSIS — T464X5A Adverse effect of angiotensin-converting-enzyme inhibitors, initial encounter: Secondary | ICD-10-CM

## 2012-02-03 DIAGNOSIS — J45909 Unspecified asthma, uncomplicated: Secondary | ICD-10-CM | POA: Insufficient documentation

## 2012-02-03 DIAGNOSIS — R059 Cough, unspecified: Secondary | ICD-10-CM | POA: Insufficient documentation

## 2012-02-03 DIAGNOSIS — Z79899 Other long term (current) drug therapy: Secondary | ICD-10-CM | POA: Insufficient documentation

## 2012-02-03 DIAGNOSIS — F17200 Nicotine dependence, unspecified, uncomplicated: Secondary | ICD-10-CM | POA: Insufficient documentation

## 2012-02-03 DIAGNOSIS — Z87828 Personal history of other (healed) physical injury and trauma: Secondary | ICD-10-CM | POA: Insufficient documentation

## 2012-02-03 LAB — CBC WITH DIFFERENTIAL/PLATELET
Basophils Absolute: 0 10*3/uL (ref 0.0–0.1)
Basophils Relative: 1 % (ref 0–1)
Eosinophils Absolute: 0.3 10*3/uL (ref 0.0–0.7)
Eosinophils Relative: 3 % (ref 0–5)
HCT: 42.7 % (ref 39.0–52.0)
Hemoglobin: 14.1 g/dL (ref 13.0–17.0)
Lymphocytes Relative: 21 % (ref 12–46)
Lymphs Abs: 1.7 10*3/uL (ref 0.7–4.0)
MCH: 32.4 pg (ref 26.0–34.0)
MCHC: 33 g/dL (ref 30.0–36.0)
MCV: 98.2 fL (ref 78.0–100.0)
Monocytes Absolute: 0.7 10*3/uL (ref 0.1–1.0)
Monocytes Relative: 9 % (ref 3–12)
Neutro Abs: 5.3 10*3/uL (ref 1.7–7.7)
Neutrophils Relative %: 67 % (ref 43–77)
Platelets: 190 10*3/uL (ref 150–400)
RBC: 4.35 MIL/uL (ref 4.22–5.81)
RDW: 13.7 % (ref 11.5–15.5)
WBC: 7.9 10*3/uL (ref 4.0–10.5)

## 2012-02-03 LAB — BASIC METABOLIC PANEL
BUN: 8 mg/dL (ref 6–23)
CO2: 23 mEq/L (ref 19–32)
Calcium: 9.7 mg/dL (ref 8.4–10.5)
Chloride: 101 mEq/L (ref 96–112)
Creatinine, Ser: 0.69 mg/dL (ref 0.50–1.35)
GFR calc Af Amer: >90 mL/min (ref >90)
GFR calc non Af Amer: >90 mL/min (ref >90)
Glucose, Bld: 86 mg/dL (ref 70–99)
Potassium: 4.5 mEq/L (ref 3.5–5.1)
Sodium: 136 mEq/L (ref 135–145)

## 2012-02-03 MED ORDER — HYDROCHLOROTHIAZIDE 25 MG PO TABS: 25.0000 mg | ORAL_TABLET | Freq: Every day | ORAL | Status: DC

## 2012-02-03 NOTE — ED Notes (Signed)
Discharge instructions reviewed. Pt verbalized understanding.  

## 2012-02-03 NOTE — ED Notes (Signed)
Lab called to draw blood in triage.

## 2012-02-03 NOTE — ED Notes (Signed)
Pt presents to department for evaluation of diffuse chest pain, cough and thick sputum. 3/10 chest soreness at the time. States cough with thick white sputum. Pt is conscious alert and oriented x4. NAD.
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
## 2012-04-03 ENCOUNTER — Emergency Department (HOSPITAL_COMMUNITY): Payer: PRIVATE HEALTH INSURANCE

## 2012-04-03 ENCOUNTER — Emergency Department (HOSPITAL_COMMUNITY)
Admission: EM | Admit: 2012-04-03 | Discharge: 2012-04-03 | Disposition: A | Discharge status: Home / Self Care | Payer: PRIVATE HEALTH INSURANCE | Attending: Emergency Medicine | Admitting: Emergency Medicine

## 2012-04-03 ENCOUNTER — Encounter (HOSPITAL_COMMUNITY): Payer: Self-pay | Admitting: Emergency Medicine

## 2012-04-03 DIAGNOSIS — R079 Chest pain, unspecified: Secondary | ICD-10-CM | POA: Insufficient documentation

## 2012-04-03 DIAGNOSIS — Z8739 Personal history of other diseases of the musculoskeletal system and connective tissue: Secondary | ICD-10-CM | POA: Insufficient documentation

## 2012-04-03 DIAGNOSIS — J441 Chronic obstructive pulmonary disease with (acute) exacerbation: Secondary | ICD-10-CM

## 2012-04-03 DIAGNOSIS — I10 Essential (primary) hypertension: Secondary | ICD-10-CM | POA: Insufficient documentation

## 2012-04-03 DIAGNOSIS — J45901 Unspecified asthma with (acute) exacerbation: Secondary | ICD-10-CM | POA: Insufficient documentation

## 2012-04-03 DIAGNOSIS — Z79899 Other long term (current) drug therapy: Secondary | ICD-10-CM | POA: Insufficient documentation

## 2012-04-03 DIAGNOSIS — F17200 Nicotine dependence, unspecified, uncomplicated: Secondary | ICD-10-CM | POA: Insufficient documentation

## 2012-04-03 LAB — BASIC METABOLIC PANEL
BUN: 8 mg/dL (ref 6–23)
CO2: 25 mEq/L (ref 19–32)
Calcium: 9.1 mg/dL (ref 8.4–10.5)
Chloride: 103 mEq/L (ref 96–112)
Creatinine, Ser: 0.76 mg/dL (ref 0.50–1.35)
GFR calc Af Amer: >90 mL/min (ref >90)
GFR calc non Af Amer: >90 mL/min (ref >90)
Glucose, Bld: 82 mg/dL (ref 70–99)
Potassium: 4.2 mEq/L (ref 3.5–5.1)
Sodium: 137 mEq/L (ref 135–145)

## 2012-04-03 LAB — CBC
HCT: 41.3 % (ref 39.0–52.0)
Hemoglobin: 14 g/dL (ref 13.0–17.0)
MCH: 33.6 pg (ref 26.0–34.0)
MCHC: 33.9 g/dL (ref 30.0–36.0)
MCV: 99 fL (ref 78.0–100.0)
Platelets: 170 10*3/uL (ref 150–400)
RBC: 4.17 MIL/uL — ABNORMAL LOW (ref 4.22–5.81)
RDW: 13.6 % (ref 11.5–15.5)
WBC: 8.4 10*3/uL (ref 4.0–10.5)

## 2012-04-03 LAB — POCT I-STAT TROPONIN I: Troponin i, poc: 0.01 ng/mL (ref 0.00–0.08)

## 2012-04-03 MED ORDER — HYDROCODONE-ACETAMINOPHEN 5-325 MG PO TABS: 1.0000 | ORAL_TABLET | Freq: Four times a day (QID) | ORAL | Status: DC | PRN

## 2012-04-03 MED ORDER — PREDNISONE (PAK) 10 MG PO TABS: ORAL_TABLET | ORAL | Status: DC

## 2012-04-03 MED ORDER — ALBUTEROL SULFATE (5 MG/ML) 0.5% IN NEBU
10.0000 mg | INHALATION_SOLUTION | Freq: Once | RESPIRATORY_TRACT | Status: AC
  Administered 2012-04-03: 10 mg via RESPIRATORY_TRACT
  Filled 2012-04-03: qty 2

## 2012-04-03 MED ORDER — ALBUTEROL SULFATE HFA 108 (90 BASE) MCG/ACT IN AERS
1.0000 | INHALATION_SPRAY | RESPIRATORY_TRACT | Status: DC | PRN
  Administered 2012-04-03: 2 via RESPIRATORY_TRACT
  Filled 2012-04-03: qty 6.7

## 2012-04-03 MED ORDER — IPRATROPIUM BROMIDE 0.02 % IN SOLN
0.5000 mg | Freq: Once | RESPIRATORY_TRACT | Status: AC
  Administered 2012-04-03: 0.5 mg via RESPIRATORY_TRACT
  Filled 2012-04-03: qty 2.5

## 2012-04-03 MED ORDER — ALBUTEROL SULFATE (5 MG/ML) 0.5% IN NEBU
5.0000 mg | INHALATION_SOLUTION | Freq: Once | RESPIRATORY_TRACT | Status: AC
  Administered 2012-04-03: 5 mg via RESPIRATORY_TRACT
  Filled 2012-04-03: qty 1

## 2012-04-03 MED ORDER — METHYLPREDNISOLONE SODIUM SUCC 125 MG IJ SOLR
125.0000 mg | Freq: Once | INTRAMUSCULAR | Status: AC
  Administered 2012-04-03: 125 mg via INTRAVENOUS
  Filled 2012-04-03: qty 2
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
## 2012-04-03 NOTE — ED Notes (Signed)
[name], pt's counselor called to check on him.

## 2012-04-03 NOTE — ED Notes (Signed)
Pt states that been having intermittent right sided chest pain that radiates to his right shoulder blade since Sunday. Pt states that he has had cough for several months, cough sometimes productive "white color phlegm". Pt has PMH asthma.

## 2012-04-03 NOTE — ED Notes (Signed)
Patient transported to X-ray 

## 2012-04-03 NOTE — ED Notes (Signed)
Pt stated that he was hungry and would like food. RN, Lyla Son stated that he can have food when blood tests result.

## 2012-04-05 ENCOUNTER — Emergency Department (HOSPITAL_COMMUNITY): Payer: PRIVATE HEALTH INSURANCE

## 2012-04-05 ENCOUNTER — Emergency Department (HOSPITAL_COMMUNITY)
Admission: EM | Admit: 2012-04-05 | Discharge: 2012-04-05 | Disposition: A | Discharge status: Home / Self Care | Payer: PRIVATE HEALTH INSURANCE | Attending: Emergency Medicine | Admitting: Emergency Medicine

## 2012-04-05 ENCOUNTER — Encounter (HOSPITAL_COMMUNITY): Payer: Self-pay | Admitting: *Deleted

## 2012-04-05 DIAGNOSIS — R6883 Chills (without fever): Secondary | ICD-10-CM | POA: Insufficient documentation

## 2012-04-05 DIAGNOSIS — R51 Headache: Secondary | ICD-10-CM | POA: Insufficient documentation

## 2012-04-05 DIAGNOSIS — Z79899 Other long term (current) drug therapy: Secondary | ICD-10-CM | POA: Insufficient documentation

## 2012-04-05 DIAGNOSIS — R093 Abnormal sputum: Secondary | ICD-10-CM | POA: Insufficient documentation

## 2012-04-05 DIAGNOSIS — R079 Chest pain, unspecified: Secondary | ICD-10-CM | POA: Insufficient documentation

## 2012-04-05 DIAGNOSIS — R05 Cough: Secondary | ICD-10-CM

## 2012-04-05 DIAGNOSIS — J441 Chronic obstructive pulmonary disease with (acute) exacerbation: Secondary | ICD-10-CM | POA: Insufficient documentation

## 2012-04-05 DIAGNOSIS — Z87828 Personal history of other (healed) physical injury and trauma: Secondary | ICD-10-CM | POA: Insufficient documentation

## 2012-04-05 DIAGNOSIS — G8929 Other chronic pain: Secondary | ICD-10-CM | POA: Insufficient documentation

## 2012-04-05 DIAGNOSIS — R059 Cough, unspecified: Secondary | ICD-10-CM

## 2012-04-05 DIAGNOSIS — M79609 Pain in unspecified limb: Secondary | ICD-10-CM | POA: Insufficient documentation

## 2012-04-05 DIAGNOSIS — I10 Essential (primary) hypertension: Secondary | ICD-10-CM | POA: Insufficient documentation

## 2012-04-05 DIAGNOSIS — R0602 Shortness of breath: Secondary | ICD-10-CM | POA: Insufficient documentation

## 2012-04-05 DIAGNOSIS — F17200 Nicotine dependence, unspecified, uncomplicated: Secondary | ICD-10-CM | POA: Insufficient documentation

## 2012-04-05 DIAGNOSIS — R61 Generalized hyperhidrosis: Secondary | ICD-10-CM | POA: Insufficient documentation

## 2012-04-05 DIAGNOSIS — J45901 Unspecified asthma with (acute) exacerbation: Secondary | ICD-10-CM

## 2012-04-05 LAB — POCT I-STAT, CHEM 8
BUN: 6 mg/dL (ref 6–23)
Calcium, Ion: 1.15 mmol/L (ref 1.12–1.23)
Chloride: 102 mEq/L (ref 96–112)
Creatinine, Ser: 1 mg/dL (ref 0.50–1.35)
Glucose, Bld: 106 mg/dL — ABNORMAL HIGH (ref 70–99)
HCT: 45 % (ref 39.0–52.0)
Hemoglobin: 15.3 g/dL (ref 13.0–17.0)
Potassium: 3.5 mEq/L (ref 3.5–5.1)
Sodium: 136 mEq/L (ref 135–145)
TCO2: 27 mmol/L (ref 0–100)

## 2012-04-05 MED ORDER — IPRATROPIUM BROMIDE 0.02 % IN SOLN
0.5000 mg | Freq: Once | RESPIRATORY_TRACT | Status: AC
  Administered 2012-04-05: 0.5 mg via RESPIRATORY_TRACT
  Filled 2012-04-05: qty 2.5

## 2012-04-05 MED ORDER — ALBUTEROL SULFATE (5 MG/ML) 0.5% IN NEBU
2.5000 mg | INHALATION_SOLUTION | Freq: Once | RESPIRATORY_TRACT | Status: AC
  Administered 2012-04-05: 2.5 mg via RESPIRATORY_TRACT
  Filled 2012-04-05: qty 0.5

## 2012-04-05 MED ORDER — ALBUTEROL SULFATE (5 MG/ML) 0.5% IN NEBU
5.0000 mg | INHALATION_SOLUTION | Freq: Once | RESPIRATORY_TRACT | Status: AC
  Administered 2012-04-05: 5 mg via RESPIRATORY_TRACT
  Filled 2012-04-05: qty 1

## 2012-04-05 MED ORDER — PREDNISONE 20 MG PO TABS
60.0000 mg | ORAL_TABLET | Freq: Once | ORAL | Status: AC
  Administered 2012-04-05: 60 mg via ORAL
  Filled 2012-04-05: qty 3

## 2012-04-05 MED ORDER — AZITHROMYCIN 250 MG PO TABS: ORAL_TABLET | ORAL | Status: DC

## 2012-04-05 MED ORDER — PREDNISONE 20 MG PO TABS: 20.0000 mg | ORAL_TABLET | Freq: Every day | ORAL | Status: DC

## 2012-04-05 MED ORDER — ALBUTEROL SULFATE HFA 108 (90 BASE) MCG/ACT IN AERS: 2.0000 | INHALATION_SPRAY | Freq: Four times a day (QID) | RESPIRATORY_TRACT | Status: DC | PRN

## 2012-04-05 MED ORDER — ALBUTEROL SULFATE HFA 108 (90 BASE) MCG/ACT IN AERS
2.0000 | INHALATION_SPRAY | RESPIRATORY_TRACT | Status: DC
  Administered 2012-04-05: 2 via RESPIRATORY_TRACT
  Filled 2012-04-05: qty 6.7

## 2012-04-05 MED ORDER — ACETAMINOPHEN 325 MG PO TABS
325.0000 mg | ORAL_TABLET | Freq: Once | ORAL | Status: AC
  Administered 2012-04-05: 325 mg via ORAL
  Filled 2012-04-05: qty 1

## 2012-04-05 NOTE — ED Notes (Signed)
Pt escorted to discharge window. Pt verbalized understanding discharge instructions. In no acute distress.  
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
## 2012-04-05 NOTE — ED Notes (Signed)
Pt alert and oriented x4.  bilateral symmetrical rise and fall of chest. Skin warm and dry. In no acute distress. Denies needs.

## 2012-04-05 NOTE — ED Notes (Signed)
md at bedside

## 2012-04-05 NOTE — ED Notes (Signed)
EAV:[id number]<JX> Expected date:04/05/12<BR> Expected time: 7:49 AM<BR> Means of arrival:Ambulance<BR> Comments:<BR> SOB

## 2012-04-05 NOTE — ED Notes (Addendum)
EMS reports pt was seen in ED in the last week, dx with a "lung infection", pt does not think pneumonia. Pt reports he losts his prescriptions so he could not get them filled and has become worse. Productive cough, thick congestion wheezing heard in lower right lung fields. In no acute distress. 95% on room air. 5mg  albuterol given

## 2012-04-05 NOTE — ED Notes (Signed)
Pt to xray

## 2012-04-05 NOTE — ED Provider Notes (Signed)
I saw and evaluated the patient, reviewed the resident's note and I agree with the findings and plan.  The primary encounter diagnosis was SOB (shortness of breath). Diagnoses of Cough and Asthma with COPD with exacerbation were also pertinent to this visit.  Dg Chest 2 View  04/05/2012  *RADIOLOGY REPORT*  Clinical Data: Extreme shortness of breath with cough and congestion.  History of asthma, smoking and hypertension.  CHEST - 2 VIEW  Comparison: 04/03/2012 and 02/03/2012.  Findings: The heart size and mediastinal contours are stable.  The lungs are clear.  There is no pleural effusion or pneumothorax. 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  These may be post-traumatic or secondary to osteochondromas.  The distal left clavicle has been previously resected.  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  IMPRESSION: Stable examination.  No acute cardiopulmonary process.   Original Report Authenticated By: Carey Bullocks, M.D.    Dg Chest 2 View  04/03/2012  *RADIOLOGY REPORT*  Clinical Data: Chest pain and cough.  CHEST - 2 VIEW  Comparison: 02/03/2012.  Findings: The cardiac silhouette, mediastinal and hilar contours are normal and stable.  The lungs are clear.  No pleural effusion. Remote clavicle trauma is suspected.  Spur versus an osteochondroma involving the glenoid. The bony thorax is intact.  IMPRESSION: No acute cardiopulmonary findings.   Original Report Authenticated By: Rudie Meyer, M.D.   I personally reviewed the imaging tests through PACS system  I reviewed available ER/hospitalization records through the EMR  The patient feels much better after her nebulized breathing treatments.  Discharge home in good condition.  Home with steroids and azithromycin.   Lyanne Co, MD 04/05/12 1046

## 2012-04-05 NOTE — ED Notes (Signed)
RT called

## 2012-05-28 ENCOUNTER — Encounter (HOSPITAL_COMMUNITY): Payer: Self-pay | Admitting: Emergency Medicine

## 2012-05-28 ENCOUNTER — Emergency Department (HOSPITAL_COMMUNITY): Payer: PRIVATE HEALTH INSURANCE

## 2012-05-28 ENCOUNTER — Emergency Department (HOSPITAL_COMMUNITY)
Admission: EM | Admit: 2012-05-28 | Discharge: 2012-05-28 | Disposition: A | Discharge status: Home / Self Care | Payer: PRIVATE HEALTH INSURANCE | Attending: Emergency Medicine | Admitting: Emergency Medicine

## 2012-05-28 DIAGNOSIS — F17200 Nicotine dependence, unspecified, uncomplicated: Secondary | ICD-10-CM | POA: Insufficient documentation

## 2012-05-28 DIAGNOSIS — M545 Low back pain, unspecified: Secondary | ICD-10-CM

## 2012-05-28 DIAGNOSIS — IMO0002 Reserved for concepts with insufficient information to code with codable children: Secondary | ICD-10-CM | POA: Insufficient documentation

## 2012-05-28 DIAGNOSIS — I10 Essential (primary) hypertension: Secondary | ICD-10-CM | POA: Insufficient documentation

## 2012-05-28 DIAGNOSIS — Y92009 Unspecified place in unspecified non-institutional (private) residence as the place of occurrence of the external cause: Secondary | ICD-10-CM | POA: Insufficient documentation

## 2012-05-28 DIAGNOSIS — Z8739 Personal history of other diseases of the musculoskeletal system and connective tissue: Secondary | ICD-10-CM | POA: Insufficient documentation

## 2012-05-28 DIAGNOSIS — J45909 Unspecified asthma, uncomplicated: Secondary | ICD-10-CM | POA: Insufficient documentation

## 2012-05-28 DIAGNOSIS — Z79899 Other long term (current) drug therapy: Secondary | ICD-10-CM | POA: Insufficient documentation

## 2012-05-28 DIAGNOSIS — Y939 Activity, unspecified: Secondary | ICD-10-CM | POA: Insufficient documentation

## 2012-05-28 DIAGNOSIS — W108XXA Fall (on) (from) other stairs and steps, initial encounter: Secondary | ICD-10-CM | POA: Insufficient documentation

## 2012-05-28 MED ORDER — METHOCARBAMOL 500 MG PO TABS: 500.0000 mg | ORAL_TABLET | Freq: Two times a day (BID) | ORAL | Status: DC

## 2012-05-28 MED ORDER — ALBUTEROL SULFATE HFA 108 (90 BASE) MCG/ACT IN AERS
2.0000 | INHALATION_SPRAY | Freq: Four times a day (QID) | RESPIRATORY_TRACT | Status: DC | PRN
  Administered 2012-05-28: 2 via RESPIRATORY_TRACT
  Filled 2012-05-28: qty 6.7
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
## 2012-05-28 NOTE — ED Notes (Signed)
Pt c/o fall down stairs x 3 days ago; pt sts lower back pain and head pain; pt denies LOC

## 2012-05-30 NOTE — ED Provider Notes (Signed)
Medical screening examination/treatment/procedure(s) were performed by non-physician practitioner and as supervising physician I was immediately available for consultation/collaboration.   Richardean Canal, MD 05/30/12 [phone]

## 2012-07-19 ENCOUNTER — Other Ambulatory Visit: Payer: Self-pay | Admitting: Internal Medicine

## 2012-07-19 DIAGNOSIS — M545 Low back pain, unspecified: Secondary | ICD-10-CM

## 2012-08-15 ENCOUNTER — Other Ambulatory Visit: Payer: Medicare Other

## 2012-09-05 ENCOUNTER — Emergency Department (HOSPITAL_COMMUNITY)
Admission: EM | Admit: 2012-09-05 | Discharge: 2012-09-05 | Disposition: A | Discharge status: Home / Self Care | Payer: PRIVATE HEALTH INSURANCE | Attending: Emergency Medicine | Admitting: Emergency Medicine

## 2012-09-05 ENCOUNTER — Emergency Department (HOSPITAL_COMMUNITY): Payer: PRIVATE HEALTH INSURANCE

## 2012-09-05 ENCOUNTER — Encounter (HOSPITAL_COMMUNITY): Payer: Self-pay

## 2012-09-05 DIAGNOSIS — Z87828 Personal history of other (healed) physical injury and trauma: Secondary | ICD-10-CM | POA: Insufficient documentation

## 2012-09-05 DIAGNOSIS — W1809XA Striking against other object with subsequent fall, initial encounter: Secondary | ICD-10-CM | POA: Insufficient documentation

## 2012-09-05 DIAGNOSIS — M79609 Pain in unspecified limb: Secondary | ICD-10-CM | POA: Insufficient documentation

## 2012-09-05 DIAGNOSIS — F10929 Alcohol use, unspecified with intoxication, unspecified: Secondary | ICD-10-CM

## 2012-09-05 DIAGNOSIS — G8929 Other chronic pain: Secondary | ICD-10-CM | POA: Insufficient documentation

## 2012-09-05 DIAGNOSIS — S0100XA Unspecified open wound of scalp, initial encounter: Secondary | ICD-10-CM | POA: Insufficient documentation

## 2012-09-05 DIAGNOSIS — J45909 Unspecified asthma, uncomplicated: Secondary | ICD-10-CM | POA: Insufficient documentation

## 2012-09-05 DIAGNOSIS — R110 Nausea: Secondary | ICD-10-CM | POA: Insufficient documentation

## 2012-09-05 DIAGNOSIS — I10 Essential (primary) hypertension: Secondary | ICD-10-CM | POA: Insufficient documentation

## 2012-09-05 DIAGNOSIS — Y939 Activity, unspecified: Secondary | ICD-10-CM | POA: Insufficient documentation

## 2012-09-05 DIAGNOSIS — F1010 Alcohol abuse, uncomplicated: Secondary | ICD-10-CM | POA: Insufficient documentation

## 2012-09-05 DIAGNOSIS — Y929 Unspecified place or not applicable: Secondary | ICD-10-CM | POA: Insufficient documentation

## 2012-09-05 DIAGNOSIS — Z79899 Other long term (current) drug therapy: Secondary | ICD-10-CM | POA: Insufficient documentation

## 2012-09-05 DIAGNOSIS — F17200 Nicotine dependence, unspecified, uncomplicated: Secondary | ICD-10-CM | POA: Insufficient documentation

## 2012-09-05 DIAGNOSIS — W19XXXA Unspecified fall, initial encounter: Secondary | ICD-10-CM

## 2012-09-05 LAB — ETHANOL: Alcohol, Ethyl (B): 141 mg/dL — ABNORMAL HIGH (ref 0–11)

## 2012-09-05 MED ORDER — ONDANSETRON 4 MG PO TBDP
4.0000 mg | ORAL_TABLET | Freq: Once | ORAL | Status: AC
  Administered 2012-09-05: 4 mg via ORAL
  Filled 2012-09-05: qty 1

## 2012-09-05 NOTE — ED Provider Notes (Signed)
Medical screening examination/treatment/procedure(s) were performed by non-physician practitioner and as supervising physician I was immediately available for consultation/collaboration.  John-Adam Niels Cranshaw, M.D.     John-Adam Marra Fraga, MD 09/05/12 2309 
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
## 2012-09-05 NOTE — ED Notes (Signed)
Patient presents to ED via [facility]. Pt c/o of fall from standing position, hitting his head on the door frame. ETOH tonight per patient. Laceration to top of head, bleeding controlled at this time. Upon arrival to ED patient A&Ox4. Patient c/o of his head hurting 4/10. VSS.

## 2012-09-05 NOTE — ED Provider Notes (Signed)
Medical screening examination/treatment/procedure(s) were performed by non-physician practitioner and as supervising physician I was immediately available for consultation/collaboration.  Jones Skene, M.D.  Jones Skene, MD 09/05/12 [phone]
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
## 2012-09-17 ENCOUNTER — Emergency Department (HOSPITAL_COMMUNITY)
Admission: EM | Admit: 2012-09-17 | Discharge: 2012-09-17 | Disposition: A | Discharge status: Home / Self Care | Payer: PRIVATE HEALTH INSURANCE | Attending: Emergency Medicine | Admitting: Emergency Medicine

## 2012-09-17 DIAGNOSIS — M79609 Pain in unspecified limb: Secondary | ICD-10-CM | POA: Insufficient documentation

## 2012-09-17 DIAGNOSIS — G8929 Other chronic pain: Secondary | ICD-10-CM | POA: Insufficient documentation

## 2012-09-17 DIAGNOSIS — F17200 Nicotine dependence, unspecified, uncomplicated: Secondary | ICD-10-CM | POA: Insufficient documentation

## 2012-09-17 DIAGNOSIS — I10 Essential (primary) hypertension: Secondary | ICD-10-CM | POA: Insufficient documentation

## 2012-09-17 DIAGNOSIS — Z79899 Other long term (current) drug therapy: Secondary | ICD-10-CM | POA: Insufficient documentation

## 2012-09-17 DIAGNOSIS — J45909 Unspecified asthma, uncomplicated: Secondary | ICD-10-CM | POA: Insufficient documentation

## 2012-09-17 DIAGNOSIS — Z4802 Encounter for removal of sutures: Secondary | ICD-10-CM | POA: Insufficient documentation

## 2012-09-17 NOTE — ED Notes (Signed)
Pt here to have 3 staples removed from the top of his head
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
## 2012-09-20 NOTE — ED Provider Notes (Signed)
Medical screening examination/treatment/procedure(s) were performed by non-physician practitioner and as supervising physician I was immediately available for consultation/collaboration.  Candyce Churn, MD 09/20/12 0800

## 2012-11-13 ENCOUNTER — Encounter (HOSPITAL_COMMUNITY): Payer: Self-pay | Admitting: *Deleted

## 2012-11-19 ENCOUNTER — Encounter (HOSPITAL_COMMUNITY): Payer: Self-pay | Admitting: Pharmacy Technician

## 2012-12-10 ENCOUNTER — Ambulatory Visit: Payer: Self-pay | Admitting: Podiatry

## 2012-12-10 ENCOUNTER — Other Ambulatory Visit: Payer: Self-pay | Admitting: Gastroenterology

## 2012-12-10 NOTE — Addendum Note (Signed)
Addended by: Yaw Escoto on: 12/10/2012 05:06 PM   Modules accepted: Orders  

## 2012-12-11 ENCOUNTER — Encounter (HOSPITAL_COMMUNITY): Payer: Self-pay | Admitting: *Deleted

## 2012-12-11 ENCOUNTER — Encounter (HOSPITAL_COMMUNITY): Payer: PRIVATE HEALTH INSURANCE | Admitting: Certified Registered Nurse Anesthetist

## 2012-12-11 ENCOUNTER — Encounter (HOSPITAL_COMMUNITY)
Admission: RE | Disposition: A | Discharge status: Home / Self Care | Payer: Self-pay | Source: Ambulatory Visit | Attending: Gastroenterology

## 2012-12-11 ENCOUNTER — Ambulatory Visit (HOSPITAL_COMMUNITY): Payer: PRIVATE HEALTH INSURANCE | Admitting: Certified Registered Nurse Anesthetist

## 2012-12-11 ENCOUNTER — Ambulatory Visit (HOSPITAL_COMMUNITY)
Admission: RE | Admit: 2012-12-11 | Discharge: 2012-12-11 | Disposition: A | Discharge status: Home / Self Care | Payer: PRIVATE HEALTH INSURANCE | Source: Ambulatory Visit | Attending: Gastroenterology | Admitting: Gastroenterology

## 2012-12-11 DIAGNOSIS — Z79899 Other long term (current) drug therapy: Secondary | ICD-10-CM | POA: Insufficient documentation

## 2012-12-11 DIAGNOSIS — F17200 Nicotine dependence, unspecified, uncomplicated: Secondary | ICD-10-CM | POA: Insufficient documentation

## 2012-12-11 DIAGNOSIS — J449 Chronic obstructive pulmonary disease, unspecified: Secondary | ICD-10-CM | POA: Insufficient documentation

## 2012-12-11 DIAGNOSIS — J4489 Other specified chronic obstructive pulmonary disease: Secondary | ICD-10-CM | POA: Insufficient documentation

## 2012-12-11 DIAGNOSIS — K620 Anal polyp: Secondary | ICD-10-CM | POA: Insufficient documentation

## 2012-12-11 DIAGNOSIS — Z1211 Encounter for screening for malignant neoplasm of colon: Secondary | ICD-10-CM | POA: Insufficient documentation

## 2012-12-11 DIAGNOSIS — I10 Essential (primary) hypertension: Secondary | ICD-10-CM | POA: Insufficient documentation

## 2012-12-11 HISTORY — DX: Male erectile dysfunction, unspecified: N52.9

## 2012-12-11 HISTORY — DX: Vitamin D deficiency, unspecified: E55.9

## 2012-12-11 HISTORY — DX: Low back pain: M54.5

## 2012-12-11 HISTORY — DX: Low back pain, unspecified: M54.50

## 2012-12-11 HISTORY — DX: Dermatitis, unspecified: L30.9

## 2012-12-11 HISTORY — DX: Gastro-esophageal reflux disease without esophagitis: K21.9

## 2012-12-11 HISTORY — DX: Pain in unspecified joint: M25.50

## 2012-12-11 HISTORY — DX: Paresthesia of skin: R20.2

## 2012-12-11 HISTORY — DX: Personal history of other (healed) physical injury and trauma: Z87.828

## 2012-12-11 HISTORY — PX: COLONOSCOPY WITH PROPOFOL: SHX5780

## 2012-12-11 HISTORY — DX: Unspecified osteoarthritis, unspecified site: M19.90

## 2012-12-11 SURGERY — COLONOSCOPY WITH PROPOFOL
Anesthesia: Monitor Anesthesia Care

## 2012-12-11 MED ORDER — LACTATED RINGERS IV SOLN
INTRAVENOUS | Status: DC
  Administered 2012-12-11: 1000 mL via INTRAVENOUS

## 2012-12-11 MED ORDER — SODIUM CHLORIDE 0.9 % IV SOLN: INTRAVENOUS | Status: DC

## 2012-12-11 MED ORDER — LIDOCAINE HCL (CARDIAC) 20 MG/ML IV SOLN
INTRAVENOUS | Status: DC | PRN
  Administered 2012-12-11: 50 mg via INTRAVENOUS

## 2012-12-11 MED ORDER — PROPOFOL 10 MG/ML IV EMUL
INTRAVENOUS | Status: DC | PRN
  Administered 2012-12-11: 20 mg via INTRAVENOUS

## 2012-12-11 MED ORDER — GLYCOPYRROLATE 0.2 MG/ML IJ SOLN
INTRAMUSCULAR | Status: DC | PRN
  Administered 2012-12-11: 0.2 mg via INTRAVENOUS

## 2012-12-11 MED ORDER — PROPOFOL INFUSION 10 MG/ML OPTIME
INTRAVENOUS | Status: DC | PRN
  Administered 2012-12-11: 75 ug/kg/min via INTRAVENOUS

## 2012-12-11 MED ORDER — LACTATED RINGERS IV SOLN
INTRAVENOUS | Status: DC | PRN
  Administered 2012-12-11: 08:00:00 via INTRAVENOUS

## 2012-12-11 MED ORDER — KETAMINE HCL 10 MG/ML IJ SOLN
INTRAMUSCULAR | Status: DC | PRN
  Administered 2012-12-11: 20 mg via INTRAVENOUS

## 2012-12-11 SURGICAL SUPPLY — 21 items

## 2012-12-11 NOTE — Interval H&P Note (Deleted)
History and Physical Interval Note:  12/11/2012 7:54 AM  [name]  has presented today for surgery, with the diagnosis of screening  The various methods of treatment have been discussed with the patient and family. After consideration of risks, benefits and other options for treatment, the patient has consented to  Procedure(s): COLONOSCOPY WITH PROPOFOL (N/A) as a surgical intervention .  The patient's history has been reviewed, patient examined, no change in status, stable for surgery.  I have reviewed the patient's chart and labs.  Questions were answered to the patient's satisfaction.     [name] C.

## 2012-12-11 NOTE — Anesthesia Preprocedure Evaluation (Signed)
Anesthesia Evaluation  Patient identified by MRN, date of birth, ID band Patient awake    Reviewed: Allergy & Precautions, H&P , NPO status , Patient's Chart, lab work & pertinent test results  Airway Mallampati: II TM Distance: >3 FB Neck ROM: Full    Dental no notable dental hx.    Pulmonary asthma , COPDCurrent Smoker,  breath sounds clear to auscultation  Pulmonary exam normal       Cardiovascular hypertension, Pt. on medications Rhythm:Regular Rate:Normal     Neuro/Psych negative neurological ROS  negative psych ROS   GI/Hepatic negative GI ROS, Neg liver ROS,   Endo/Other  negative endocrine ROS  Renal/GU negative Renal ROS  negative genitourinary   Musculoskeletal negative musculoskeletal ROS (+)   Abdominal   Peds negative pediatric ROS (+)  Hematology negative hematology ROS (+)   Anesthesia Other Findings   Reproductive/Obstetrics negative OB ROS                           Anesthesia Physical Anesthesia Plan  ASA: II  Anesthesia Plan: MAC   Post-op Pain Management:    Induction:   Airway Management Planned:   Additional Equipment:   Intra-op Plan:   Post-operative Plan:   Informed Consent: I have reviewed the patients History and Physical, chart, labs and discussed the procedure including the risks, benefits and alternatives for the proposed anesthesia with the patient or authorized representative who has indicated his/her understanding and acceptance.   Dental advisory given  Plan Discussed with: CRNA  Anesthesia Plan Comments:         Anesthesia Quick Evaluation

## 2012-12-11 NOTE — Preoperative (Signed)
Beta Blockers   Reason not to administer Beta Blockers:Not Applicable 

## 2012-12-11 NOTE — Transfer of Care (Signed)
Immediate Anesthesia Transfer of Care Note  [name]  Procedure(s) Performed: Procedure(s): COLONOSCOPY WITH PROPOFOL (N/A)  Patient Location: Endoscopy Unit  Anesthesia Type:MAC  Level of Consciousness: sedated, patient cooperative and responds to stimulation  Airway & Oxygen Therapy: Patient Spontanous Breathing and Patient connected to face mask oxygen  Post-op Assessment: Report given to PACU RN and Post -op Vital signs reviewed and stable  Post vital signs: Reviewed and stable  Complications: No apparent anesthesia complications

## 2012-12-11 NOTE — Anesthesia Postprocedure Evaluation (Signed)
  Anesthesia Post-op Note  [name]  Procedure(s) Performed: Procedure(s) (LRB): COLONOSCOPY WITH PROPOFOL (N/A)  Patient Location: PACU  Anesthesia Type: MAC  Level of Consciousness: awake and alert   Airway and Oxygen Therapy: Patient Spontanous Breathing  Post-op Pain: mild  Post-op Assessment: Post-op Vital signs reviewed, Patient's Cardiovascular Status Stable, Respiratory Function Stable, Patent Airway and No signs of Nausea or vomiting  Last Vitals:  Filed Vitals:   12/11/12 0940  BP: 178/94  Pulse:   Temp:   Resp: 25    Post-op Vital Signs: stable   Complications: No apparent anesthesia complications

## 2012-12-11 NOTE — H&P (Addendum)
  Date of Initial H&P: 12/07/12. H and P is from 12/07/12.  History reviewed, patient examined, no change in status, stable for surgery. Screening colonoscopy.

## 2012-12-11 NOTE — H&P (Deleted)
  Date of Initial H&P: 10/07/12  History reviewed, patient examined, no change in status, stable for surgery. Screening colonoscopy.

## 2012-12-11 NOTE — Interval H&P Note (Signed)
History and Physical Interval Note:  12/11/2012 8:06 AM  [name]  has presented today for surgery, with the diagnosis of screening  The various methods of treatment have been discussed with the patient and family. After consideration of risks, benefits and other options for treatment, the patient has consented to  Procedure(s): COLONOSCOPY WITH PROPOFOL (N/A) as a surgical intervention .  The patient's history has been reviewed, patient examined, no change in status, stable for surgery.  I have reviewed the patient's chart and labs.  Questions were answered to the patient's satisfaction.     [name]

## 2012-12-11 NOTE — Op Note (Signed)
[facility] - Closed [address] [place], [address]   COLONOSCOPY PROCEDURE REPORT  [name], [name].  MR#: [id number] BIRTHDATE: 1955-07-31 , 57  yrs. old GENDER: Male ENDOSCOPIST: Charlott Rakes, MD REFERRED [id number] Concepcion Elk, M.D. PROCEDURE DATE:  12/11/2012 PROCEDURE:   Colonoscopy, screening ASA CLASS:   Class II INDICATIONS:Colorectal cancer screening. MEDICATIONS: See Anesthesia Report.  DESCRIPTION OF PROCEDURE:   After the risks benefits and alternatives of the procedure were thoroughly explained, informed consent was obtained.  The Pentax Ped Colon [id number]  endoscope was introduced through the anus and advanced to the cecum, which was identified by both the appendix and ileocecal valve , limited by No adverse events experienced.   The quality of the prep was fair except right colon was unsatisfactory. .  The instrument was then slowly withdrawn as the colon was fully examined.     FINDINGS:  Rectal exam unremarkable.  Pediatric colonoscope inserted into the colon and advanced to the cecum, where the appendiceal orifice and ileocecal valve were identified. A large amount of adherent solid and semi-solid stool seen in the proximal ascending colon and cecum that could not be irrigated away. Unsatisfactory prep in proximal right colon prevented adequate visualization of this part of the colon. The colonoscope was withdrawn and 3 small flat polyps were seen in the rectum that were removed with cold biopsy forceps. Retroflexion revealed medium-sized internal hemorrhoids.  COMPLICATIONS: None  IMPRESSION:     1. Colon polyps X 3 - s/p biopsies 2. Internal hemorrhoids 3. Poor prep of proximal colon  RECOMMENDATIONS:  F/U on path; Barium enema to be done at later date to evaluate proximal colon    ______________________________ eSigned:  Charlott Rakes, MD 12/11/2012 8:43 AM   NF:AOZHY Concepcion Elk, MD

## 2012-12-12 ENCOUNTER — Encounter (HOSPITAL_COMMUNITY): Payer: Self-pay | Admitting: Gastroenterology

## 2012-12-23 ENCOUNTER — Emergency Department (HOSPITAL_COMMUNITY): Payer: PRIVATE HEALTH INSURANCE

## 2012-12-23 ENCOUNTER — Encounter (HOSPITAL_COMMUNITY): Payer: Self-pay | Admitting: Emergency Medicine

## 2012-12-23 ENCOUNTER — Emergency Department (HOSPITAL_COMMUNITY)
Admission: EM | Admit: 2012-12-23 | Discharge: 2012-12-23 | Disposition: A | Discharge status: Home / Self Care | Payer: PRIVATE HEALTH INSURANCE | Attending: Emergency Medicine | Admitting: Emergency Medicine

## 2012-12-23 DIAGNOSIS — Z87828 Personal history of other (healed) physical injury and trauma: Secondary | ICD-10-CM | POA: Insufficient documentation

## 2012-12-23 DIAGNOSIS — Z862 Personal history of diseases of the blood and blood-forming organs and certain disorders involving the immune mechanism: Secondary | ICD-10-CM | POA: Insufficient documentation

## 2012-12-23 DIAGNOSIS — Z8639 Personal history of other endocrine, nutritional and metabolic disease: Secondary | ICD-10-CM | POA: Insufficient documentation

## 2012-12-23 DIAGNOSIS — Z872 Personal history of diseases of the skin and subcutaneous tissue: Secondary | ICD-10-CM | POA: Insufficient documentation

## 2012-12-23 DIAGNOSIS — Z87448 Personal history of other diseases of urinary system: Secondary | ICD-10-CM | POA: Insufficient documentation

## 2012-12-23 DIAGNOSIS — J45901 Unspecified asthma with (acute) exacerbation: Secondary | ICD-10-CM | POA: Insufficient documentation

## 2012-12-23 DIAGNOSIS — K219 Gastro-esophageal reflux disease without esophagitis: Secondary | ICD-10-CM | POA: Insufficient documentation

## 2012-12-23 DIAGNOSIS — G8929 Other chronic pain: Secondary | ICD-10-CM | POA: Insufficient documentation

## 2012-12-23 DIAGNOSIS — F17200 Nicotine dependence, unspecified, uncomplicated: Secondary | ICD-10-CM | POA: Insufficient documentation

## 2012-12-23 DIAGNOSIS — Z8739 Personal history of other diseases of the musculoskeletal system and connective tissue: Secondary | ICD-10-CM | POA: Insufficient documentation

## 2012-12-23 DIAGNOSIS — I10 Essential (primary) hypertension: Secondary | ICD-10-CM | POA: Insufficient documentation

## 2012-12-23 DIAGNOSIS — J40 Bronchitis, not specified as acute or chronic: Secondary | ICD-10-CM

## 2012-12-23 DIAGNOSIS — Z79899 Other long term (current) drug therapy: Secondary | ICD-10-CM | POA: Insufficient documentation

## 2012-12-23 MED ORDER — HYDROCOD POLST-CHLORPHEN POLST 10-8 MG/5ML PO LQCR
5.0000 mL | Freq: Once | ORAL | Status: AC
  Administered 2012-12-23: 5 mL via ORAL
  Filled 2012-12-23: qty 5

## 2012-12-23 MED ORDER — PREDNISONE 10 MG PO TABS: 20.0000 mg | ORAL_TABLET | Freq: Every day | ORAL | Status: DC

## 2012-12-23 MED ORDER — AZITHROMYCIN 250 MG PO TABS
500.0000 mg | ORAL_TABLET | Freq: Once | ORAL | Status: AC
Start: 2012-12-23 — End: 2012-12-23
  Administered 2012-12-23: 500 mg via ORAL
  Filled 2012-12-23: qty 2

## 2012-12-23 MED ORDER — PREDNISONE 20 MG PO TABS
60.0000 mg | ORAL_TABLET | Freq: Once | ORAL | Status: AC
  Administered 2012-12-23: 60 mg via ORAL
  Filled 2012-12-23: qty 3

## 2012-12-23 MED ORDER — ALBUTEROL SULFATE (5 MG/ML) 0.5% IN NEBU
5.0000 mg | INHALATION_SOLUTION | Freq: Once | RESPIRATORY_TRACT | Status: AC
  Administered 2012-12-23: 5 mg via RESPIRATORY_TRACT
  Filled 2012-12-23: qty 1

## 2012-12-23 MED ORDER — BENZONATATE 100 MG PO CAPS: 100.0000 mg | ORAL_CAPSULE | Freq: Three times a day (TID) | ORAL | Status: DC

## 2012-12-23 MED ORDER — AZITHROMYCIN 250 MG PO TABS: 250.0000 mg | ORAL_TABLET | Freq: Every day | ORAL | Status: DC

## 2012-12-23 NOTE — ED Notes (Signed)
Chronic cough for over a year, productive with white sputum, states he is sore from coughing
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
## 2012-12-26 NOTE — ED Provider Notes (Signed)
Medical screening examination/treatment/procedure(s) were performed by non-physician practitioner and as supervising physician I was immediately available for consultation/collaboration.  EKG Interpretation   None         Camaron Cammack, MD 12/26/12 1105 

## 2013-01-07 ENCOUNTER — Ambulatory Visit: Payer: Self-pay | Admitting: Podiatry

## 2013-02-25 ENCOUNTER — Ambulatory Visit: Payer: Medicare Other | Admitting: Podiatry

## 2013-03-04 ENCOUNTER — Ambulatory Visit: Payer: Medicare Other | Admitting: Podiatry

## 2013-03-21 ENCOUNTER — Encounter (HOSPITAL_COMMUNITY): Payer: Self-pay | Admitting: Radiology

## 2013-03-21 ENCOUNTER — Emergency Department (HOSPITAL_COMMUNITY): Payer: PRIVATE HEALTH INSURANCE

## 2013-03-21 ENCOUNTER — Emergency Department (HOSPITAL_COMMUNITY)
Admission: EM | Admit: 2013-03-21 | Discharge: 2013-03-21 | Disposition: A | Discharge status: Home / Self Care | Payer: PRIVATE HEALTH INSURANCE | Attending: Emergency Medicine | Admitting: Emergency Medicine

## 2013-03-21 DIAGNOSIS — Z87448 Personal history of other diseases of urinary system: Secondary | ICD-10-CM | POA: Diagnosis not present

## 2013-03-21 DIAGNOSIS — Y929 Unspecified place or not applicable: Secondary | ICD-10-CM | POA: Diagnosis not present

## 2013-03-21 DIAGNOSIS — F1010 Alcohol abuse, uncomplicated: Secondary | ICD-10-CM | POA: Insufficient documentation

## 2013-03-21 DIAGNOSIS — S0990XA Unspecified injury of head, initial encounter: Secondary | ICD-10-CM

## 2013-03-21 DIAGNOSIS — Y939 Activity, unspecified: Secondary | ICD-10-CM | POA: Diagnosis not present

## 2013-03-21 DIAGNOSIS — Z87828 Personal history of other (healed) physical injury and trauma: Secondary | ICD-10-CM | POA: Insufficient documentation

## 2013-03-21 DIAGNOSIS — R296 Repeated falls: Secondary | ICD-10-CM | POA: Insufficient documentation

## 2013-03-21 DIAGNOSIS — S0993XA Unspecified injury of face, initial encounter: Secondary | ICD-10-CM | POA: Diagnosis not present

## 2013-03-21 DIAGNOSIS — S199XXA Unspecified injury of neck, initial encounter: Secondary | ICD-10-CM

## 2013-03-21 DIAGNOSIS — K219 Gastro-esophageal reflux disease without esophagitis: Secondary | ICD-10-CM | POA: Diagnosis not present

## 2013-03-21 DIAGNOSIS — W19XXXA Unspecified fall, initial encounter: Secondary | ICD-10-CM

## 2013-03-21 DIAGNOSIS — F17200 Nicotine dependence, unspecified, uncomplicated: Secondary | ICD-10-CM | POA: Diagnosis not present

## 2013-03-21 DIAGNOSIS — M129 Arthropathy, unspecified: Secondary | ICD-10-CM | POA: Insufficient documentation

## 2013-03-21 DIAGNOSIS — J45909 Unspecified asthma, uncomplicated: Secondary | ICD-10-CM | POA: Diagnosis not present

## 2013-03-21 DIAGNOSIS — I10 Essential (primary) hypertension: Secondary | ICD-10-CM | POA: Diagnosis not present

## 2013-03-21 DIAGNOSIS — M549 Dorsalgia, unspecified: Secondary | ICD-10-CM | POA: Insufficient documentation

## 2013-03-21 DIAGNOSIS — Z79899 Other long term (current) drug therapy: Secondary | ICD-10-CM | POA: Insufficient documentation

## 2013-03-21 DIAGNOSIS — Z862 Personal history of diseases of the blood and blood-forming organs and certain disorders involving the immune mechanism: Secondary | ICD-10-CM | POA: Diagnosis not present

## 2013-03-21 DIAGNOSIS — R109 Unspecified abdominal pain: Secondary | ICD-10-CM | POA: Insufficient documentation

## 2013-03-21 DIAGNOSIS — Z792 Long term (current) use of antibiotics: Secondary | ICD-10-CM | POA: Diagnosis not present

## 2013-03-21 DIAGNOSIS — IMO0002 Reserved for concepts with insufficient information to code with codable children: Secondary | ICD-10-CM | POA: Diagnosis not present

## 2013-03-21 DIAGNOSIS — F10929 Alcohol use, unspecified with intoxication, unspecified: Secondary | ICD-10-CM

## 2013-03-21 DIAGNOSIS — R079 Chest pain, unspecified: Secondary | ICD-10-CM | POA: Diagnosis not present

## 2013-03-21 DIAGNOSIS — Z872 Personal history of diseases of the skin and subcutaneous tissue: Secondary | ICD-10-CM | POA: Insufficient documentation

## 2013-03-21 DIAGNOSIS — G8929 Other chronic pain: Secondary | ICD-10-CM | POA: Diagnosis not present

## 2013-03-21 DIAGNOSIS — Z8639 Personal history of other endocrine, nutritional and metabolic disease: Secondary | ICD-10-CM | POA: Insufficient documentation

## 2013-03-21 LAB — RAPID URINE DRUG SCREEN, HOSP PERFORMED
Amphetamines: NOT DETECTED
Barbiturates: NOT DETECTED
Benzodiazepines: NOT DETECTED
Cocaine: NOT DETECTED
Opiates: NOT DETECTED
Tetrahydrocannabinol: NOT DETECTED

## 2013-03-21 LAB — CBC WITH DIFFERENTIAL/PLATELET
Basophils Absolute: 0 10*3/uL (ref 0.0–0.1)
Basophils Relative: 0 % (ref 0–1)
Eosinophils Absolute: 0.4 10*3/uL (ref 0.0–0.7)
Eosinophils Relative: 3 % (ref 0–5)
HCT: 45.5 % (ref 39.0–52.0)
Hemoglobin: 15.9 g/dL (ref 13.0–17.0)
Lymphocytes Relative: 15 % (ref 12–46)
Lymphs Abs: 1.9 10*3/uL (ref 0.7–4.0)
MCH: 34 pg (ref 26.0–34.0)
MCHC: 34.9 g/dL (ref 30.0–36.0)
MCV: 97.2 fL (ref 78.0–100.0)
Monocytes Absolute: 0.7 10*3/uL (ref 0.1–1.0)
Monocytes Relative: 5 % (ref 3–12)
Neutro Abs: 9.8 10*3/uL — ABNORMAL HIGH (ref 1.7–7.7)
Neutrophils Relative %: 77 % (ref 43–77)
Platelets: 181 10*3/uL (ref 150–400)
RBC: 4.68 MIL/uL (ref 4.22–5.81)
RDW: 13.2 % (ref 11.5–15.5)
WBC: 12.8 10*3/uL — ABNORMAL HIGH (ref 4.0–10.5)

## 2013-03-21 LAB — URINALYSIS, ROUTINE W REFLEX MICROSCOPIC
Bilirubin Urine: NEGATIVE
Glucose, UA: NEGATIVE mg/dL
Hgb urine dipstick: NEGATIVE
Ketones, ur: NEGATIVE mg/dL
Leukocytes, UA: NEGATIVE
Nitrite: NEGATIVE
Protein, ur: NEGATIVE mg/dL
Specific Gravity, Urine: 1.006 (ref 1.005–1.030)
Urobilinogen, UA: 0.2 mg/dL (ref 0.0–1.0)
pH: 5.5 (ref 5.0–8.0)

## 2013-03-21 LAB — COMPREHENSIVE METABOLIC PANEL
ALT: 15 U/L (ref 0–53)
AST: 20 U/L (ref 0–37)
Albumin: 3.5 g/dL (ref 3.5–5.2)
Alkaline Phosphatase: 92 U/L (ref 39–117)
BUN: 9 mg/dL (ref 6–23)
CO2: 22 mEq/L (ref 19–32)
Calcium: 9.3 mg/dL (ref 8.4–10.5)
Chloride: 106 mEq/L (ref 96–112)
Creatinine, Ser: 0.73 mg/dL (ref 0.50–1.35)
GFR calc Af Amer: >90 mL/min (ref >90)
GFR calc non Af Amer: >90 mL/min (ref >90)
Glucose, Bld: 91 mg/dL (ref 70–99)
Potassium: 4.4 mEq/L (ref 3.7–5.3)
Sodium: 143 mEq/L (ref 137–147)
Total Bilirubin: <0.2 mg/dL — ABNORMAL LOW (ref 0.3–1.2)
Total Protein: 6.6 g/dL (ref 6.0–8.3)

## 2013-03-21 LAB — ETHANOL: Alcohol, Ethyl (B): 198 mg/dL — ABNORMAL HIGH (ref 0–11)

## 2013-03-21 MED ORDER — IOHEXOL 300 MG/ML  SOLN
100.0000 mL | Freq: Once | INTRAMUSCULAR | Status: AC | PRN
  Administered 2013-03-21: 100 mL via INTRAVENOUS

## 2013-03-21 MED ORDER — MORPHINE SULFATE 4 MG/ML IJ SOLN: 4.0000 mg | Freq: Once | INTRAMUSCULAR | Status: DC

## 2013-03-21 NOTE — ED Notes (Signed)
C Collar removed per MD Silverio LayYao verbal order

## 2013-03-21 NOTE — ED Notes (Signed)
Updated pt.s family on plan of care 

## 2013-03-21 NOTE — ED Notes (Signed)
Pt. Talking to his daughter.

## 2013-03-21 NOTE — ED Notes (Signed)
[name] pt's daughter called for discharge

## 2013-03-21 NOTE — ED Notes (Signed)
Warm blankets given.

## 2013-03-21 NOTE — ED Notes (Signed)
Report given to Brittany, RN.

## 2013-03-21 NOTE — ED Notes (Signed)
Pt. Transferred to Pod B.
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
## 2013-03-21 NOTE — Discharge Instructions (Signed)
Avoid drinking alcohol.   Take tylenol, motrin for pain.   Do not walk up stairs when you are intoxicated.   Follow up with your doctor.   Return to ER if you have severe pain, vomiting, unable to walk.

## 2013-03-28 ENCOUNTER — Ambulatory Visit: Payer: Medicare Other | Admitting: Podiatrist

## 2013-04-01 ENCOUNTER — Encounter: Payer: Self-pay | Admitting: Podiatry

## 2013-04-01 ENCOUNTER — Ambulatory Visit (INDEPENDENT_AMBULATORY_CARE_PROVIDER_SITE_OTHER): Payer: Medicare Other | Admitting: Podiatry

## 2013-04-01 VITALS — BP 152/72 | HR 87 | Resp 16

## 2013-04-01 DIAGNOSIS — Q828 Other specified congenital malformations of skin: Secondary | ICD-10-CM

## 2013-04-01 DIAGNOSIS — B351 Tinea unguium: Secondary | ICD-10-CM

## 2013-04-01 DIAGNOSIS — M79609 Pain in unspecified limb: Secondary | ICD-10-CM

## 2013-04-02 NOTE — Progress Notes (Signed)
Subjective:     Patient ID: [name], male   DOB: 09/28/1955, 58 y.o.   MRN: [id number]  HPI patient presents with thick painful nailbeds 1-5 both feet and lesions on the bottom of both feet that are very painful when pressed   Review of Systems     Objective:   Physical Exam Neurovascular status intact no health history changes noted with thick nails 1-5 both feet that are painful in 4 different lesions plantar aspect feet that are very tender when pressed    Assessment:     Mycotic nail infection with pain 1-5 both feet and lesions on both feet that are painful and thick    Plan:     Debridement painful nail bed 1-5 both feet with no iatrogenic bleeding and debridement of lesions on both feet

## 2013-07-01 ENCOUNTER — Ambulatory Visit: Payer: Medicare Other | Admitting: Podiatry

## 2013-07-03 ENCOUNTER — Ambulatory Visit: Payer: Medicaid Other | Admitting: Podiatry

## 2013-07-10 ENCOUNTER — Ambulatory Visit: Payer: Medicaid Other | Admitting: Podiatry

## 2013-07-11 ENCOUNTER — Ambulatory Visit: Payer: Medicaid Other | Admitting: Podiatry

## 2013-09-12 ENCOUNTER — Ambulatory Visit: Payer: Medicaid Other | Admitting: Podiatrist

## 2013-09-12 ENCOUNTER — Ambulatory Visit (INDEPENDENT_AMBULATORY_CARE_PROVIDER_SITE_OTHER): Payer: Medicaid Other | Admitting: Podiatrist

## 2013-09-12 DIAGNOSIS — Q828 Other specified congenital malformations of skin: Secondary | ICD-10-CM

## 2013-09-12 DIAGNOSIS — B351 Tinea unguium: Secondary | ICD-10-CM

## 2013-09-12 DIAGNOSIS — M79673 Pain in unspecified foot: Secondary | ICD-10-CM

## 2013-09-12 DIAGNOSIS — M79609 Pain in unspecified limb: Secondary | ICD-10-CM

## 2013-09-12 NOTE — Progress Notes (Signed)
   HPI:  Patient presents today for foot and nail care. Denies any new complaints today. Relates significant pain with walking and ambulatation.   Objective:  Patients chart is reviewed.  Vascular status reveals pedal pulses noted at 1 out of 4 dp and pt bilateral .  Neurological sensation is Normal to [facility] bilateral.  Patients nails are severely thickened, discolored, distrophic, friable and brittle with yellow-brown discoloration. Large porokeratotic and hyperkeratotic callouses are present submetatarsal 5 bilaterally and submetatarsal 3 right. Intact integument is noted status post debridement. Patient subjectively relates they are painful with shoes and with ambulation of bilateral feet.  Assessment:  Symptomatic onychomycosis, porokeratotic lesion x3  Plan:  Discussed treatment options and alternatives.  The symptomatic toenails and the lesions were debrided without complication.  Return appointment recommended at routine intervals of 3 months    Marlowe AschoffKathryn Anberlyn Feimster, DPM

## 2013-09-18 ENCOUNTER — Ambulatory Visit: Payer: Medicaid Other | Admitting: Podiatrist

## 2013-10-10 ENCOUNTER — Encounter (HOSPITAL_COMMUNITY): Payer: Self-pay | Admitting: Emergency Medicine

## 2013-10-10 ENCOUNTER — Emergency Department (HOSPITAL_COMMUNITY)
Admission: EM | Admit: 2013-10-10 | Discharge: 2013-10-10 | Disposition: A | Discharge status: Home / Self Care | Payer: Medicare Other | Attending: Emergency Medicine | Admitting: Emergency Medicine

## 2013-10-10 DIAGNOSIS — M549 Dorsalgia, unspecified: Secondary | ICD-10-CM

## 2013-10-10 DIAGNOSIS — J45909 Unspecified asthma, uncomplicated: Secondary | ICD-10-CM | POA: Insufficient documentation

## 2013-10-10 DIAGNOSIS — M545 Low back pain, unspecified: Secondary | ICD-10-CM | POA: Diagnosis not present

## 2013-10-10 DIAGNOSIS — I10 Essential (primary) hypertension: Secondary | ICD-10-CM | POA: Diagnosis not present

## 2013-10-10 DIAGNOSIS — Z872 Personal history of diseases of the skin and subcutaneous tissue: Secondary | ICD-10-CM | POA: Diagnosis not present

## 2013-10-10 DIAGNOSIS — Z79899 Other long term (current) drug therapy: Secondary | ICD-10-CM | POA: Diagnosis not present

## 2013-10-10 DIAGNOSIS — F17200 Nicotine dependence, unspecified, uncomplicated: Secondary | ICD-10-CM | POA: Insufficient documentation

## 2013-10-10 DIAGNOSIS — G8929 Other chronic pain: Secondary | ICD-10-CM | POA: Diagnosis not present

## 2013-10-10 DIAGNOSIS — Z760 Encounter for issue of repeat prescription: Secondary | ICD-10-CM

## 2013-10-10 DIAGNOSIS — Z87828 Personal history of other (healed) physical injury and trauma: Secondary | ICD-10-CM | POA: Insufficient documentation

## 2013-10-10 DIAGNOSIS — Z8739 Personal history of other diseases of the musculoskeletal system and connective tissue: Secondary | ICD-10-CM | POA: Insufficient documentation

## 2013-10-10 DIAGNOSIS — K219 Gastro-esophageal reflux disease without esophagitis: Secondary | ICD-10-CM | POA: Diagnosis not present

## 2013-10-10 DIAGNOSIS — Z87448 Personal history of other diseases of urinary system: Secondary | ICD-10-CM | POA: Insufficient documentation

## 2013-10-10 MED ORDER — ALBUTEROL SULFATE HFA 108 (90 BASE) MCG/ACT IN AERS: 2.0000 | INHALATION_SPRAY | RESPIRATORY_TRACT | Status: DC | PRN

## 2013-10-10 MED ORDER — HYDROCODONE-ACETAMINOPHEN 5-325 MG PO TABS
1.0000 | ORAL_TABLET | Freq: Once | ORAL | Status: AC
  Administered 2013-10-10: 1 via ORAL
  Filled 2013-10-10: qty 1

## 2013-10-10 NOTE — Discharge Instructions (Signed)
You need to make an appointment with your regular physician for refill of your chronic pain medications.    Chronic Back Pain  When back pain lasts longer than 3 months, it is called chronic back pain.People with chronic back pain often go through certain periods that are more intense (flare-ups).  CAUSES Chronic back pain can be caused by wear and tear (degeneration) on different structures in your back. These structures include:  The bones of your spine (vertebrae) and the joints surrounding your spinal cord and nerve roots (facets).  The strong, fibrous tissues that connect your vertebrae (ligaments). Degeneration of these structures may result in pressure on your nerves. This can lead to constant pain. HOME CARE INSTRUCTIONS  Avoid bending, heavy lifting, prolonged sitting, and activities which make the problem worse.  Take brief periods of rest throughout the day to reduce your pain. Lying down or standing usually is better than sitting while you are resting.  Take over-the-counter or prescription medicines only as directed by your caregiver. SEEK IMMEDIATE MEDICAL CARE IF:   You have weakness or numbness in one of your legs or feet.  You have trouble controlling your bladder or bowels.  You have nausea, vomiting, abdominal pain, shortness of breath, or fainting. Document Released: 03/10/2004 Document Revised: 04/25/2011 Document Reviewed: 01/15/2011 [facility] Patient Information 2015 [place], [place]. This information is not intended to replace advice given to you by your health care provider. Make sure you discuss any questions you have with your health care provider.  Asthma Asthma is a recurring condition in which the airways tighten and narrow. Asthma can make it difficult to breathe. It can cause coughing, wheezing, and shortness of breath. Asthma episodes, also called asthma attacks, range from minor to life-threatening. Asthma cannot be cured, but medicines and lifestyle  changes can help control it. CAUSES Asthma is believed to be caused by inherited (genetic) and environmental factors, but its exact cause is unknown. Asthma may be triggered by allergens, lung infections, or irritants in the air. Asthma triggers are different for each person. Common triggers include:   Animal dander.  Dust mites.  Cockroaches.  Pollen from trees or grass.  Mold.  Smoke.  Air pollutants such as dust, household cleaners, hair sprays, aerosol sprays, paint fumes, strong chemicals, or strong odors.  Cold air, weather changes, and winds (which increase molds and pollens in the air).  Strong emotional expressions such as crying or laughing hard.  Stress.  Certain medicines (such as aspirin) or types of drugs (such as beta-blockers).  Sulfites in foods and drinks. Foods and drinks that may contain sulfites include dried fruit, potato chips, and sparkling grape juice.  Infections or inflammatory conditions such as the flu, a cold, or an inflammation of the nasal membranes (rhinitis).  Gastroesophageal reflux disease (GERD).  Exercise or strenuous activity. SYMPTOMS Symptoms may occur immediately after asthma is triggered or many hours later. Symptoms include:  Wheezing.  Excessive nighttime or early morning coughing.  Frequent or severe coughing with a common cold.  Chest tightness.  Shortness of breath. DIAGNOSIS  The diagnosis of asthma is made by a review of your medical history and a physical exam. Tests may also be performed. These may include:  Lung function studies. These tests show how much air you breathe in and out.  Allergy tests.  Imaging tests such as X-rays. TREATMENT  Asthma cannot be cured, but it can usually be controlled. Treatment involves identifying and avoiding your asthma triggers. It also involves medicines. There  are 2 classes of medicine used for asthma treatment:   Controller medicines. These prevent asthma symptoms from  occurring. They are usually taken every day.  Reliever or rescue medicines. These quickly relieve asthma symptoms. They are used as needed and provide short-term relief. Your health care provider will help you create an asthma action plan. An asthma action plan is a written plan for managing and treating your asthma attacks. It includes a list of your asthma triggers and how they may be avoided. It also includes information on when medicines should be taken and when their dosage should be changed. An action plan may also involve the use of a device called a peak flow meter. A peak flow meter measures how well the lungs are working. It helps you monitor your condition. HOME CARE INSTRUCTIONS   Take medicines only as directed by your health care provider. Speak with your health care provider if you have questions about how or when to take the medicines.  Use a peak flow meter as directed by your health care provider. Record and keep track of readings.  Understand and use the action plan to help minimize or stop an asthma attack without needing to seek medical care.  Control your home environment in the following ways to help prevent asthma attacks:  Do not smoke. Avoid being exposed to secondhand smoke.  Change your heating and air conditioning filter regularly.  Limit your use of fireplaces and wood stoves.  Get rid of pests (such as roaches and mice) and their droppings.  Throw away plants if you see mold on them.  Clean your floors and dust regularly. Use unscented cleaning products.  Try to have someone else vacuum for you regularly. Stay out of rooms while they are being vacuumed and for a short while afterward. If you vacuum, use a dust mask from a hardware store, a double-layered or microfilter vacuum cleaner bag, or a vacuum cleaner with a HEPA filter.  Replace carpet with wood, tile, or vinyl flooring. Carpet can trap dander and dust.  Use allergy-proof pillows, mattress covers,  and box spring covers.  Wash bed sheets and blankets every week in hot water and dry them in a dryer.  Use blankets that are made of polyester or cotton.  Clean bathrooms and kitchens with bleach. If possible, have someone repaint the walls in these rooms with mold-resistant paint. Keep out of the rooms that are being cleaned and painted.  Wash hands frequently. SEEK MEDICAL CARE IF:   You have wheezing, shortness of breath, or a cough even if taking medicine to prevent attacks.  The colored mucus you cough up (sputum) is thicker than usual.  Your sputum changes from clear or white to yellow, green, gray, or bloody.  You have any problems that may be related to the medicines you are taking (such as a rash, itching, swelling, or trouble breathing).  You are using a reliever medicine more than 2-3 times per week.  Your peak flow is still at 50-79% of your personal best after following your action plan for 1 hour.  You have a fever. SEEK IMMEDIATE MEDICAL CARE IF:   You seem to be getting worse and are unresponsive to treatment during an asthma attack.  You are short of breath even at rest.  You get short of breath when doing very little physical activity.  You have difficulty eating, drinking, or talking due to asthma symptoms.  You develop chest pain.  You develop a fast heartbeat.  You have a bluish color to your lips or fingernails.  You are light-headed, dizzy, or faint.  Your peak flow is less than 50% of your personal best. MAKE SURE YOU:   Understand these instructions.  Will watch your condition.  Will get help right away if you are not doing well or get worse. Document Released: 01/31/2005 Document Revised: 06/17/2013 Document Reviewed: 08/30/2012 [facility] Patient Information 2015 [place], [place]. This information is not intended to replace advice given to you by your health care provider. Make sure you discuss any questions you have with your health care  provider.
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
## 2013-10-10 NOTE — ED Notes (Signed)
Patient states has trouble with back and has been unable to get to his doctor for medication.   Patient states he usually gets vicodin for pain from primary doctor.    Patient talking in full sentences with no difficulty.  Patient states he has trouble with asthma sometimes at night.

## 2013-10-10 NOTE — ED Provider Notes (Signed)
Medical screening examination/treatment/procedure(s) were performed by non-physician practitioner and as supervising physician I was immediately available for consultation/collaboration.  Leslee Home, M.D.   Reuben Likes, MD 10/10/13 1059

## 2013-11-16 ENCOUNTER — Emergency Department (HOSPITAL_COMMUNITY): Payer: Medicare Other

## 2013-11-16 ENCOUNTER — Emergency Department (HOSPITAL_COMMUNITY)
Admission: EM | Admit: 2013-11-16 | Discharge: 2013-11-16 | Disposition: A | Discharge status: Home / Self Care | Payer: Medicare Other | Attending: Emergency Medicine | Admitting: Emergency Medicine

## 2013-11-16 DIAGNOSIS — I10 Essential (primary) hypertension: Secondary | ICD-10-CM | POA: Insufficient documentation

## 2013-11-16 DIAGNOSIS — IMO0002 Reserved for concepts with insufficient information to code with codable children: Secondary | ICD-10-CM

## 2013-11-16 DIAGNOSIS — Y9289 Other specified places as the place of occurrence of the external cause: Secondary | ICD-10-CM | POA: Diagnosis not present

## 2013-11-16 DIAGNOSIS — Z720 Tobacco use: Secondary | ICD-10-CM | POA: Diagnosis not present

## 2013-11-16 DIAGNOSIS — J45909 Unspecified asthma, uncomplicated: Secondary | ICD-10-CM | POA: Insufficient documentation

## 2013-11-16 DIAGNOSIS — S0990XA Unspecified injury of head, initial encounter: Secondary | ICD-10-CM

## 2013-11-16 DIAGNOSIS — M1990 Unspecified osteoarthritis, unspecified site: Secondary | ICD-10-CM | POA: Diagnosis not present

## 2013-11-16 DIAGNOSIS — W01198A Fall on same level from slipping, tripping and stumbling with subsequent striking against other object, initial encounter: Secondary | ICD-10-CM | POA: Diagnosis not present

## 2013-11-16 DIAGNOSIS — Z79899 Other long term (current) drug therapy: Secondary | ICD-10-CM | POA: Diagnosis not present

## 2013-11-16 DIAGNOSIS — S0191XA Laceration without foreign body of unspecified part of head, initial encounter: Secondary | ICD-10-CM | POA: Insufficient documentation

## 2013-11-16 DIAGNOSIS — Z8719 Personal history of other diseases of the digestive system: Secondary | ICD-10-CM | POA: Diagnosis not present

## 2013-11-16 DIAGNOSIS — F10920 Alcohol use, unspecified with intoxication, uncomplicated: Secondary | ICD-10-CM

## 2013-11-16 DIAGNOSIS — Y9389 Activity, other specified: Secondary | ICD-10-CM | POA: Diagnosis not present

## 2013-11-16 DIAGNOSIS — F1010 Alcohol abuse, uncomplicated: Secondary | ICD-10-CM | POA: Insufficient documentation

## 2013-11-16 DIAGNOSIS — Z87448 Personal history of other diseases of urinary system: Secondary | ICD-10-CM | POA: Diagnosis not present

## 2013-11-16 DIAGNOSIS — Z87828 Personal history of other (healed) physical injury and trauma: Secondary | ICD-10-CM | POA: Insufficient documentation

## 2013-11-16 DIAGNOSIS — G8929 Other chronic pain: Secondary | ICD-10-CM | POA: Diagnosis not present

## 2013-11-16 DIAGNOSIS — Z8639 Personal history of other endocrine, nutritional and metabolic disease: Secondary | ICD-10-CM | POA: Diagnosis not present

## 2013-11-16 LAB — COMPREHENSIVE METABOLIC PANEL
ALT: 18 U/L (ref 0–53)
AST: 21 U/L (ref 0–37)
Albumin: 4.1 g/dL (ref 3.5–5.2)
Alkaline Phosphatase: 99 U/L (ref 39–117)
Anion gap: 17 — ABNORMAL HIGH (ref 5–15)
BUN: 10 mg/dL (ref 6–23)
CO2: 22 mEq/L (ref 19–32)
Calcium: 9.5 mg/dL (ref 8.4–10.5)
Chloride: 104 mEq/L (ref 96–112)
Creatinine, Ser: 0.67 mg/dL (ref 0.50–1.35)
GFR calc Af Amer: >90 mL/min (ref >90)
GFR calc non Af Amer: >90 mL/min (ref >90)
Glucose, Bld: 78 mg/dL (ref 70–99)
Potassium: 3.8 mEq/L (ref 3.7–5.3)
Sodium: 143 mEq/L (ref 137–147)
Total Bilirubin: <0.2 mg/dL — ABNORMAL LOW (ref 0.3–1.2)
Total Protein: 7.6 g/dL (ref 6.0–8.3)

## 2013-11-16 LAB — CBC WITH DIFFERENTIAL/PLATELET
Basophils Absolute: 0.1 10*3/uL (ref 0.0–0.1)
Basophils Relative: 1 % (ref 0–1)
Eosinophils Absolute: 0.3 10*3/uL (ref 0.0–0.7)
Eosinophils Relative: 3 % (ref 0–5)
HCT: 44.5 % (ref 39.0–52.0)
Hemoglobin: 15.2 g/dL (ref 13.0–17.0)
Lymphocytes Relative: 25 % (ref 12–46)
Lymphs Abs: 1.9 10*3/uL (ref 0.7–4.0)
MCH: 33.3 pg (ref 26.0–34.0)
MCHC: 34.2 g/dL (ref 30.0–36.0)
MCV: 97.4 fL (ref 78.0–100.0)
Monocytes Absolute: 0.5 10*3/uL (ref 0.1–1.0)
Monocytes Relative: 6 % (ref 3–12)
Neutro Abs: 5 10*3/uL (ref 1.7–7.7)
Neutrophils Relative %: 65 % (ref 43–77)
Platelets: 204 10*3/uL (ref 150–400)
RBC: 4.57 MIL/uL (ref 4.22–5.81)
RDW: 13.4 % (ref 11.5–15.5)
WBC: 7.6 10*3/uL (ref 4.0–10.5)

## 2013-11-16 LAB — PROTIME-INR
INR: 0.98 (ref 0.00–1.49)
Prothrombin Time: 13 seconds (ref 11.6–15.2)

## 2013-11-16 LAB — ETHANOL: Alcohol, Ethyl (B): 193 mg/dL — ABNORMAL HIGH (ref 0–11)

## 2013-11-16 LAB — APTT: aPTT: 30 seconds (ref 24–37)

## 2013-11-16 MED ORDER — THIAMINE HCL 100 MG/ML IJ SOLN
Freq: Once | INTRAMUSCULAR | Status: DC
  Filled 2013-11-16: qty 1000
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
## 2013-11-16 NOTE — ED Notes (Signed)
Remains in x-ray  

## 2013-11-16 NOTE — ED Notes (Signed)
Pt handed an urinal to use 
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
## 2013-11-16 NOTE — ED Notes (Signed)
Pt placed in room from hallway; myself and Adria, NT undressed pt, placed in gown, on monitor, continuous pulse oximetry and blood pressure cuff; multiple chuks placed behind his head; warm blankets given

## 2013-11-16 NOTE — ED Notes (Signed)
Pt's lunch tray was delivered; pt now getting set up to eat

## 2013-11-16 NOTE — Discharge Instructions (Signed)
Do not participate in any sports or any activities that could result in head trauma until you are cleared by your,  primary care physician or neurologist.   Keep wound dry and do not remove dressing for 24 hours if possible. After that, wash gently morning and night (every 12 hours) with soap and water. Use a topical antibiotic ointment and cover with a bandaid or gauze.    Do NOT use rubbing alcohol or hydrogen peroxide, do not soak the area   Present to your primary care doctor or the urgent care of your choice, or the ED for Staple removal in 7-10 days.   Every attempt was made to remove foreign body (contaminants) from the wound.  However, there is always a chance that some may remain in the wound. This can  increase your risk of infection.   If you see signs of infection (warmth, redness, tenderness, pus, sharp increase in pain, fever, red streaking in the skin) immediately return to the emergency department.   Please follow with your primary care doctor in the next 2 days for a check-up. They must obtain records for further management.   Do not hesitate to return to the Emergency Department for any new, worsening or concerning symptoms.     ,

## 2013-11-17 NOTE — ED Provider Notes (Signed)
Medical screening examination/treatment/procedure(s) were performed by non-physician practitioner and as supervising physician I was immediately available for consultation/collaboration.   EKG Interpretation None        Eilam Shrewsbury T Toron Bowring, MD 11/17/13 0818 

## 2013-11-21 ENCOUNTER — Emergency Department (HOSPITAL_COMMUNITY): Payer: PRIVATE HEALTH INSURANCE

## 2013-11-21 ENCOUNTER — Emergency Department (HOSPITAL_COMMUNITY)
Admission: EM | Admit: 2013-11-21 | Discharge: 2013-11-21 | Disposition: A | Discharge status: Home / Self Care | Payer: PRIVATE HEALTH INSURANCE | Attending: Emergency Medicine | Admitting: Emergency Medicine

## 2013-11-21 ENCOUNTER — Encounter (HOSPITAL_COMMUNITY): Payer: Self-pay | Admitting: Emergency Medicine

## 2013-11-21 DIAGNOSIS — Z8639 Personal history of other endocrine, nutritional and metabolic disease: Secondary | ICD-10-CM | POA: Insufficient documentation

## 2013-11-21 DIAGNOSIS — Z79899 Other long term (current) drug therapy: Secondary | ICD-10-CM | POA: Insufficient documentation

## 2013-11-21 DIAGNOSIS — Z720 Tobacco use: Secondary | ICD-10-CM | POA: Diagnosis not present

## 2013-11-21 DIAGNOSIS — J45901 Unspecified asthma with (acute) exacerbation: Secondary | ICD-10-CM | POA: Insufficient documentation

## 2013-11-21 DIAGNOSIS — G8929 Other chronic pain: Secondary | ICD-10-CM | POA: Diagnosis not present

## 2013-11-21 DIAGNOSIS — Z872 Personal history of diseases of the skin and subcutaneous tissue: Secondary | ICD-10-CM | POA: Diagnosis not present

## 2013-11-21 DIAGNOSIS — R05 Cough: Secondary | ICD-10-CM

## 2013-11-21 DIAGNOSIS — Z8719 Personal history of other diseases of the digestive system: Secondary | ICD-10-CM | POA: Insufficient documentation

## 2013-11-21 DIAGNOSIS — Z87438 Personal history of other diseases of male genital organs: Secondary | ICD-10-CM | POA: Insufficient documentation

## 2013-11-21 DIAGNOSIS — Z87828 Personal history of other (healed) physical injury and trauma: Secondary | ICD-10-CM | POA: Insufficient documentation

## 2013-11-21 DIAGNOSIS — R058 Other specified cough: Secondary | ICD-10-CM

## 2013-11-21 DIAGNOSIS — R062 Wheezing: Secondary | ICD-10-CM

## 2013-11-21 DIAGNOSIS — I10 Essential (primary) hypertension: Secondary | ICD-10-CM | POA: Insufficient documentation

## 2013-11-21 DIAGNOSIS — M1990 Unspecified osteoarthritis, unspecified site: Secondary | ICD-10-CM | POA: Diagnosis not present

## 2013-11-21 DIAGNOSIS — R0602 Shortness of breath: Secondary | ICD-10-CM | POA: Diagnosis present

## 2013-11-21 LAB — CBC WITH DIFFERENTIAL/PLATELET
Basophils Absolute: 0 10*3/uL (ref 0.0–0.1)
Basophils Relative: 1 % (ref 0–1)
Eosinophils Absolute: 0.3 10*3/uL (ref 0.0–0.7)
Eosinophils Relative: 5 % (ref 0–5)
HCT: 32.8 % — ABNORMAL LOW (ref 39.0–52.0)
Hemoglobin: 11.1 g/dL — ABNORMAL LOW (ref 13.0–17.0)
Lymphocytes Relative: 20 % (ref 12–46)
Lymphs Abs: 1.1 10*3/uL (ref 0.7–4.0)
MCH: 33.1 pg (ref 26.0–34.0)
MCHC: 33.8 g/dL (ref 30.0–36.0)
MCV: 97.9 fL (ref 78.0–100.0)
Monocytes Absolute: 0.4 10*3/uL (ref 0.1–1.0)
Monocytes Relative: 8 % (ref 3–12)
Neutro Abs: 3.9 10*3/uL (ref 1.7–7.7)
Neutrophils Relative %: 66 % (ref 43–77)
Platelets: 179 10*3/uL (ref 150–400)
RBC: 3.35 MIL/uL — ABNORMAL LOW (ref 4.22–5.81)
RDW: 13.4 % (ref 11.5–15.5)
WBC: 5.7 10*3/uL (ref 4.0–10.5)

## 2013-11-21 LAB — BASIC METABOLIC PANEL
Anion gap: 10 (ref 5–15)
BUN: 8 mg/dL (ref 6–23)
CO2: 25 mEq/L (ref 19–32)
Calcium: 9.4 mg/dL (ref 8.4–10.5)
Chloride: 104 mEq/L (ref 96–112)
Creatinine, Ser: 0.78 mg/dL (ref 0.50–1.35)
GFR calc Af Amer: >90 mL/min (ref >90)
GFR calc non Af Amer: >90 mL/min (ref >90)
Glucose, Bld: 84 mg/dL (ref 70–99)
Potassium: 4.3 mEq/L (ref 3.7–5.3)
Sodium: 139 mEq/L (ref 137–147)

## 2013-11-21 LAB — POC OCCULT BLOOD, ED: Fecal Occult Bld: NEGATIVE

## 2013-11-21 MED ORDER — ALBUTEROL SULFATE HFA 108 (90 BASE) MCG/ACT IN AERS
2.0000 | INHALATION_SPRAY | Freq: Once | RESPIRATORY_TRACT | Status: AC
  Administered 2013-11-21: 2 via RESPIRATORY_TRACT
  Filled 2013-11-21: qty 6.7

## 2013-11-21 MED ORDER — ALBUTEROL SULFATE HFA 108 (90 BASE) MCG/ACT IN AERS: 1.0000 | INHALATION_SPRAY | Freq: Four times a day (QID) | RESPIRATORY_TRACT | Status: DC | PRN

## 2013-11-21 MED ORDER — PREDNISONE 20 MG PO TABS
60.0000 mg | ORAL_TABLET | Freq: Once | ORAL | Status: AC
  Administered 2013-11-21: 60 mg via ORAL
  Filled 2013-11-21: qty 3

## 2013-11-21 MED ORDER — METHYLPREDNISOLONE SODIUM SUCC 125 MG IJ SOLR: 125.0000 mg | Freq: Once | INTRAMUSCULAR | Status: DC

## 2013-11-21 MED ORDER — IPRATROPIUM-ALBUTEROL 0.5-2.5 (3) MG/3ML IN SOLN
RESPIRATORY_TRACT | Status: AC
  Filled 2013-11-21: qty 3

## 2013-11-21 MED ORDER — IPRATROPIUM-ALBUTEROL 0.5-2.5 (3) MG/3ML IN SOLN
3.0000 mL | Freq: Once | RESPIRATORY_TRACT | Status: AC
  Administered 2013-11-21: 3 mL via RESPIRATORY_TRACT

## 2013-11-21 MED ORDER — ALBUTEROL SULFATE (2.5 MG/3ML) 0.083% IN NEBU
5.0000 mg | INHALATION_SOLUTION | Freq: Once | RESPIRATORY_TRACT | Status: AC
  Administered 2013-11-21: 5 mg via RESPIRATORY_TRACT
  Filled 2013-11-21: qty 6

## 2013-11-21 NOTE — ED Notes (Signed)
RT note: Pt. unable to generate adequate flow for reliable,"pre/post-peak flow measurements", per Asthma Protocol order, staff familiar with pt., uses Albuterol inhaler @ home, which he is currently out of, given 5 mg Albuterol per order, RT to monitor.

## 2013-11-21 NOTE — ED Provider Notes (Signed)
I saw and evaluated the patient, reviewed the resident's note and I agree with the findings and plan.   EKG Interpretation None       Briefly, pt is a 58 y.o. male presenting with dyspnea. Pt has not had his albuterol inhaler for COPD.  I performed an examination on the patient including cardiac, pulmonary, and gi systems which were significant for bil all field wheezing.   Symptoms improved with albuterol.  Pt had a significant drop in hgb, however this is after he had bleeding from his head after a recent fall.  No abd pain, or other suspected blood loss. DC home in stable condition.  Mirian MoMatthew Gentry, MD 11/21/13 [phone]
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
## 2013-11-21 NOTE — ED Notes (Signed)
Pt presents with onset of shortness of breath that woke him from sleep.  Pt reports productive cough with white phlegm, reports pain to L foot that he is supposed to get therapy on.  Pt reports he didn't have any albuterol to relieve shortness of breath. Pt denies any chest pain.

## 2013-12-09 ENCOUNTER — Ambulatory Visit: Payer: PRIVATE HEALTH INSURANCE

## 2013-12-09 ENCOUNTER — Ambulatory Visit: Payer: PRIVATE HEALTH INSURANCE | Attending: Internal Medicine | Admitting: Physical Therapy

## 2013-12-09 DIAGNOSIS — I10 Essential (primary) hypertension: Secondary | ICD-10-CM | POA: Insufficient documentation

## 2013-12-09 DIAGNOSIS — M545 Low back pain: Secondary | ICD-10-CM | POA: Diagnosis not present

## 2013-12-09 DIAGNOSIS — Z5189 Encounter for other specified aftercare: Secondary | ICD-10-CM | POA: Diagnosis not present

## 2013-12-09 DIAGNOSIS — M25572 Pain in left ankle and joints of left foot: Secondary | ICD-10-CM | POA: Insufficient documentation

## 2013-12-10 ENCOUNTER — Ambulatory Visit: Payer: PRIVATE HEALTH INSURANCE | Admitting: Physical Therapy

## 2013-12-10 DIAGNOSIS — Z5189 Encounter for other specified aftercare: Secondary | ICD-10-CM | POA: Diagnosis not present

## 2013-12-24 ENCOUNTER — Ambulatory Visit: Payer: Medicare Other | Attending: Internal Medicine | Admitting: Physical Therapy

## 2013-12-24 DIAGNOSIS — I10 Essential (primary) hypertension: Secondary | ICD-10-CM | POA: Diagnosis not present

## 2013-12-24 DIAGNOSIS — Z5189 Encounter for other specified aftercare: Secondary | ICD-10-CM | POA: Insufficient documentation

## 2013-12-24 DIAGNOSIS — M25572 Pain in left ankle and joints of left foot: Secondary | ICD-10-CM | POA: Insufficient documentation

## 2013-12-24 DIAGNOSIS — M545 Low back pain: Secondary | ICD-10-CM | POA: Diagnosis not present

## 2013-12-24 DIAGNOSIS — M5442 Lumbago with sciatica, left side: Secondary | ICD-10-CM
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
## 2013-12-26 ENCOUNTER — Ambulatory Visit: Payer: Medicare Other | Admitting: Physical Therapy

## 2013-12-26 ENCOUNTER — Encounter: Payer: Self-pay | Admitting: Physical Therapy

## 2013-12-26 DIAGNOSIS — M5442 Lumbago with sciatica, left side: Secondary | ICD-10-CM

## 2013-12-26 DIAGNOSIS — Z5189 Encounter for other specified aftercare: Secondary | ICD-10-CM | POA: Diagnosis not present
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
## 2013-12-26 NOTE — Patient Instructions (Signed)
Thoracolumbar Side-Bend: Bolster (Side-Lying)   Lie on right side, bolster at hip crest level. Support neck with lower arm, top arm slack above head. Drop your TOP leg back off the bed.  Hold __60__ seconds. Relax. Repeat __3 times per set. Do ___1_ sets per session. Do ____2 sessions per day.  http://orth.exer.us/1014   Copyright  VHI. All rights reserved.

## 2013-12-31 ENCOUNTER — Telehealth: Payer: Self-pay | Admitting: *Deleted

## 2013-12-31 ENCOUNTER — Ambulatory Visit: Payer: Medicare Other | Admitting: Physical Therapy

## 2013-12-31 DIAGNOSIS — Z5189 Encounter for other specified aftercare: Secondary | ICD-10-CM | POA: Diagnosis not present

## 2013-12-31 DIAGNOSIS — M5442 Lumbago with sciatica, left side: Secondary | ICD-10-CM

## 2013-12-31 NOTE — Telephone Encounter (Signed)
APPTS MADE AND PRINTED...TD 
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
## 2014-01-02 ENCOUNTER — Encounter: Payer: PRIVATE HEALTH INSURANCE | Admitting: Physical Therapy

## 2014-01-14 ENCOUNTER — Ambulatory Visit: Payer: PRIVATE HEALTH INSURANCE | Attending: Internal Medicine

## 2014-01-14 DIAGNOSIS — M545 Low back pain: Secondary | ICD-10-CM | POA: Insufficient documentation

## 2014-01-14 DIAGNOSIS — M25572 Pain in left ankle and joints of left foot: Secondary | ICD-10-CM | POA: Insufficient documentation

## 2014-01-14 DIAGNOSIS — Z5189 Encounter for other specified aftercare: Secondary | ICD-10-CM | POA: Insufficient documentation

## 2014-01-14 DIAGNOSIS — I10 Essential (primary) hypertension: Secondary | ICD-10-CM | POA: Insufficient documentation

## 2014-01-16 ENCOUNTER — Encounter: Payer: Medicare Other | Admitting: Physical Therapy

## 2014-01-20 ENCOUNTER — Emergency Department (HOSPITAL_COMMUNITY): Payer: Medicare Other

## 2014-01-20 ENCOUNTER — Encounter (HOSPITAL_COMMUNITY): Payer: Self-pay

## 2014-01-20 ENCOUNTER — Emergency Department (HOSPITAL_COMMUNITY)
Admission: EM | Admit: 2014-01-20 | Discharge: 2014-01-20 | Disposition: A | Discharge status: Home / Self Care | Payer: Medicare Other | Attending: Emergency Medicine | Admitting: Emergency Medicine

## 2014-01-20 DIAGNOSIS — M1990 Unspecified osteoarthritis, unspecified site: Secondary | ICD-10-CM | POA: Insufficient documentation

## 2014-01-20 DIAGNOSIS — Z79899 Other long term (current) drug therapy: Secondary | ICD-10-CM | POA: Insufficient documentation

## 2014-01-20 DIAGNOSIS — N529 Male erectile dysfunction, unspecified: Secondary | ICD-10-CM | POA: Insufficient documentation

## 2014-01-20 DIAGNOSIS — Z8719 Personal history of other diseases of the digestive system: Secondary | ICD-10-CM | POA: Insufficient documentation

## 2014-01-20 DIAGNOSIS — G8929 Other chronic pain: Secondary | ICD-10-CM | POA: Diagnosis not present

## 2014-01-20 DIAGNOSIS — Z872 Personal history of diseases of the skin and subcutaneous tissue: Secondary | ICD-10-CM | POA: Insufficient documentation

## 2014-01-20 DIAGNOSIS — Z8639 Personal history of other endocrine, nutritional and metabolic disease: Secondary | ICD-10-CM | POA: Diagnosis not present

## 2014-01-20 DIAGNOSIS — R200 Anesthesia of skin: Secondary | ICD-10-CM | POA: Diagnosis not present

## 2014-01-20 DIAGNOSIS — Z720 Tobacco use: Secondary | ICD-10-CM | POA: Diagnosis not present

## 2014-01-20 DIAGNOSIS — R202 Paresthesia of skin: Secondary | ICD-10-CM | POA: Insufficient documentation

## 2014-01-20 DIAGNOSIS — R42 Dizziness and giddiness: Secondary | ICD-10-CM | POA: Diagnosis present

## 2014-01-20 DIAGNOSIS — J45901 Unspecified asthma with (acute) exacerbation: Secondary | ICD-10-CM | POA: Insufficient documentation

## 2014-01-20 DIAGNOSIS — I10 Essential (primary) hypertension: Secondary | ICD-10-CM | POA: Diagnosis not present

## 2014-01-20 DIAGNOSIS — Z87828 Personal history of other (healed) physical injury and trauma: Secondary | ICD-10-CM | POA: Insufficient documentation

## 2014-01-20 LAB — DIFFERENTIAL
Basophils Absolute: 0 10*3/uL (ref 0.0–0.1)
Basophils Relative: 0 % (ref 0–1)
Eosinophils Absolute: 0.2 10*3/uL (ref 0.0–0.7)
Eosinophils Relative: 3 % (ref 0–5)
Lymphocytes Relative: 22 % (ref 12–46)
Lymphs Abs: 1.6 10*3/uL (ref 0.7–4.0)
Monocytes Absolute: 0.5 10*3/uL (ref 0.1–1.0)
Monocytes Relative: 6 % (ref 3–12)
Neutro Abs: 5.1 10*3/uL (ref 1.7–7.7)
Neutrophils Relative %: 69 % (ref 43–77)

## 2014-01-20 LAB — COMPREHENSIVE METABOLIC PANEL
ALT: 24 U/L (ref 0–53)
AST: 25 U/L (ref 0–37)
Albumin: 4.3 g/dL (ref 3.5–5.2)
Alkaline Phosphatase: 99 U/L (ref 39–117)
Anion gap: 13 (ref 5–15)
BUN: 9 mg/dL (ref 6–23)
CO2: 25 mEq/L (ref 19–32)
Calcium: 10.1 mg/dL (ref 8.4–10.5)
Chloride: 99 mEq/L (ref 96–112)
Creatinine, Ser: 0.74 mg/dL (ref 0.50–1.35)
GFR calc Af Amer: >90 mL/min (ref >90)
GFR calc non Af Amer: >90 mL/min (ref >90)
Glucose, Bld: 100 mg/dL — ABNORMAL HIGH (ref 70–99)
Potassium: 4.1 mEq/L (ref 3.7–5.3)
Sodium: 137 mEq/L (ref 137–147)
Total Bilirubin: 0.4 mg/dL (ref 0.3–1.2)
Total Protein: 7.8 g/dL (ref 6.0–8.3)

## 2014-01-20 LAB — CBC
HCT: 43.3 % (ref 39.0–52.0)
Hemoglobin: 14.7 g/dL (ref 13.0–17.0)
MCH: 33.9 pg (ref 26.0–34.0)
MCHC: 33.9 g/dL (ref 30.0–36.0)
MCV: 99.8 fL (ref 78.0–100.0)
Platelets: 188 10*3/uL (ref 150–400)
RBC: 4.34 MIL/uL (ref 4.22–5.81)
RDW: 12.9 % (ref 11.5–15.5)
WBC: 7.4 10*3/uL (ref 4.0–10.5)

## 2014-01-20 LAB — PROTIME-INR
INR: 1.03 (ref 0.00–1.49)
Prothrombin Time: 13.6 seconds (ref 11.6–15.2)

## 2014-01-20 LAB — I-STAT TROPONIN, ED: Troponin i, poc: 0 ng/mL (ref 0.00–0.08)

## 2014-01-20 LAB — APTT: aPTT: 30 seconds (ref 24–37)

## 2014-01-20 MED ORDER — ALBUTEROL SULFATE (2.5 MG/3ML) 0.083% IN NEBU
5.0000 mg | INHALATION_SOLUTION | Freq: Once | RESPIRATORY_TRACT | Status: AC
  Administered 2014-01-20: 5 mg via RESPIRATORY_TRACT
  Filled 2014-01-20: qty 6

## 2014-01-20 NOTE — Discharge Instructions (Signed)
Paresthesia Paresthesia is an abnormal burning or prickling sensation. This sensation is generally felt in the hands, arms, legs, or feet. However, it may occur in any part of the body. It is usually not painful. The feeling may be described as:  Tingling or numbness.  "Pins and needles."  Skin crawling.  Buzzing.  Limbs "falling asleep."  Itching. Most people experience temporary (transient) paresthesia at some time in their lives. CAUSES  Paresthesia may occur when you breathe too quickly (hyperventilation). It can also occur without any apparent cause. Commonly, paresthesia occurs when pressure is placed on a nerve. The feeling quickly goes away once the pressure is removed. For some people, however, paresthesia is a long-lasting (chronic) condition caused by an underlying disorder. The underlying disorder may be:  A traumatic, direct injury to nerves. Examples include a:  Broken (fractured) neck.  Fractured skull.  A disorder affecting the brain and spinal cord (central nervous system). Examples include:  Transverse myelitis.  Encephalitis.  Transient ischemic attack.  Multiple sclerosis.  Stroke.  Tumor or blood vessel problems, such as an arteriovenous malformation pressing against the brain or spinal cord.  A condition that damages the peripheral nerves (peripheral neuropathy). Peripheral nerves are not part of the brain and spinal cord. These conditions include:  Diabetes.  Peripheral vascular disease.  Nerve entrapment syndromes, such as carpal tunnel syndrome.  Shingles.  Hypothyroidism.  Vitamin B12 deficiencies.  Alcoholism.  Heavy metal poisoning (lead, arsenic).  Rheumatoid arthritis.  Systemic lupus erythematosus. DIAGNOSIS  Your caregiver will attempt to find the underlying cause of your paresthesia. Your caregiver may:  Take your medical history.  Perform a physical exam.  Order various lab tests.  Order imaging tests. TREATMENT    Treatment for paresthesia depends on the underlying cause. HOME CARE INSTRUCTIONS  Avoid drinking alcohol.  You may consider massage or acupuncture to help relieve your symptoms.  Keep all follow-up appointments as directed by your caregiver. SEEK IMMEDIATE MEDICAL CARE IF:   You feel weak.  You have trouble walking or moving.  You have problems with speech or vision.  You feel confused.  You cannot control your bladder or bowel movements.  You feel numbness after an injury.  You faint.  Your burning or prickling feeling gets worse when walking.  You have pain, cramps, or dizziness.  You develop a rash. MAKE SURE YOU:  Understand these instructions.  Will watch your condition.  Will get help right away if you are not doing well or get worse. Document Released: 01/21/2002 Document Revised: 04/25/2011 Document Reviewed: 10/22/2010 [facility] Patient Information 2015 [place], [place]. This information is not intended to replace advice given to you by your health care provider. Make sure you discuss any questions you have with your health care provider. Dizziness Dizziness is a common problem. It is a feeling of unsteadiness or light-headedness. You may feel like you are about to faint. Dizziness can lead to injury if you stumble or fall. A person of any age group can suffer from dizziness, but dizziness is more common in older adults. CAUSES  Dizziness can be caused by many different things, including:  Middle ear problems.  Standing for too long.  Infections.  An allergic reaction.  Aging.  An emotional response to something, such as the sight of blood.  Side effects of medicines.  Tiredness.  Problems with circulation or blood pressure.  Excessive use of alcohol or medicines, or illegal drug use.  Breathing too fast (hyperventilation).  An irregular heart  rhythm (arrhythmia).  A low red blood cell count (anemia).  Pregnancy.  Vomiting, diarrhea,  fever, or other illnesses that cause body fluid loss (dehydration).  Diseases or conditions such as Parkinson's disease, high blood pressure (hypertension), diabetes, and thyroid problems.  Exposure to extreme heat. DIAGNOSIS  Your health care provider will ask about your symptoms, perform a physical exam, and perform an electrocardiogram (ECG) to record the electrical activity of your heart. Your health care provider may also perform other heart or blood tests to determine the cause of your dizziness. These may include:  Transthoracic echocardiogram (TTE). During echocardiography, sound waves are used to evaluate how blood flows through your heart.  Transesophageal echocardiogram (TEE).  Cardiac monitoring. This allows your health care provider to monitor your heart rate and rhythm in real time.  Holter monitor. This is a portable device that records your heartbeat and can help diagnose heart arrhythmias. It allows your health care provider to track your heart activity for several days if needed.  Stress tests by exercise or by giving medicine that makes the heart beat faster. TREATMENT  Treatment of dizziness depends on the cause of your symptoms and can vary greatly. HOME CARE INSTRUCTIONS   Drink enough fluids to keep your urine clear or pale yellow. This is especially important in very hot weather. In older adults, it is also important in cold weather.  Take your medicine exactly as directed if your dizziness is caused by medicines. When taking blood pressure medicines, it is especially important to get up slowly.  Rise slowly from chairs and steady yourself until you feel okay.  In the morning, first sit up on the side of the bed. When you feel okay, stand slowly while holding onto something until you know your balance is fine.  Move your legs often if you need to stand in one place for a long time. Tighten and relax your muscles in your legs while standing.  Have someone stay  with you for 1-2 days if dizziness continues to be a problem. Do this until you feel you are well enough to stay alone. Have the person call your health care provider if he or she notices changes in you that are concerning.  Do not drive or use heavy machinery if you feel dizzy.  Do not drink alcohol. SEEK IMMEDIATE MEDICAL CARE IF:   Your dizziness or light-headedness gets worse.  You feel nauseous or vomit.  You have problems talking, walking, or using your arms, hands, or legs.  You feel weak.  You are not thinking clearly or you have trouble forming sentences. It may take a friend or family member to notice this.  You have chest pain, abdominal pain, shortness of breath, or sweating.  Your vision changes.  You notice any bleeding.  You have side effects from medicine that seems to be getting worse rather than better. MAKE SURE YOU:   Understand these instructions.  Will watch your condition.  Will get help right away if you are not doing well or get worse. Document Released: 07/27/2000 Document Revised: 02/05/2013 Document Reviewed: 08/20/2010 [facility] CenterExit[facility] Patient Information 2015 [place], [place]. This information is not intended to replace advice given to you by your health care provider. Make sure you discuss any questions you have with your health care provider.

## 2014-01-20 NOTE — ED Provider Notes (Signed)
Care assumed at shift change [facility]. Pending CT result, then d/c home with outpatient care plan.    Physical Exam  BP 177/86 mmHg  Pulse 56  Temp(Src) 98.5 F (36.9 C) (Oral)  Resp 20  Ht 5\' 10"  (1.778 m)  Wt 180 lb (81.647 kg)  BMI 25.83 kg/m2  SpO2 100%  Physical Exam Gen: afebrile, VSS, NAD HEENT: EOMI, MMM Resp: no resp distress CV: rate WNL Abd: appearance normal, nondistended MsK: moving all extremities with ease Neuro: A&O x4  ED Course  Procedures Results for orders placed or performed during the hospital encounter of 01/20/14  Protime-INR  Result Value Ref Range   Prothrombin Time 13.6 11.6 - 15.2 seconds   INR 1.03 0.00 - 1.49  APTT  Result Value Ref Range   aPTT 30 24 - 37 seconds  CBC  Result Value Ref Range   WBC 7.4 4.0 - 10.5 K/uL   RBC 4.34 4.22 - 5.81 MIL/uL   Hemoglobin 14.7 13.0 - 17.0 g/dL   HCT 04.543.3 40.939.0 - 81.152.0 %   MCV 99.8 78.0 - 100.0 fL   MCH 33.9 26.0 - 34.0 pg   MCHC 33.9 30.0 - 36.0 g/dL   RDW 91.412.9 78.211.5 - 95.615.5 %   Platelets 188 150 - 400 K/uL  Differential  Result Value Ref Range   Neutrophils Relative % 69 43 - 77 %   Neutro Abs 5.1 1.7 - 7.7 K/uL   Lymphocytes Relative 22 12 - 46 %   Lymphs Abs 1.6 0.7 - 4.0 K/uL   Monocytes Relative 6 3 - 12 %   Monocytes Absolute 0.5 0.1 - 1.0 K/uL   Eosinophils Relative 3 0 - 5 %   Eosinophils Absolute 0.2 0.0 - 0.7 K/uL   Basophils Relative 0 0 - 1 %   Basophils Absolute 0.0 0.0 - 0.1 K/uL  Comprehensive metabolic panel  Result Value Ref Range   Sodium 137 137 - 147 mEq/L   Potassium 4.1 3.7 - 5.3 mEq/L   Chloride 99 96 - 112 mEq/L   CO2 25 19 - 32 mEq/L   Glucose, Bld 100 (H) 70 - 99 mg/dL   BUN 9 6 - 23 mg/dL   Creatinine, Ser 2.130.74 0.50 - 1.35 mg/dL   Calcium 08.610.1 8.4 - 57.810.5 mg/dL   Total Protein 7.8 6.0 - 8.3 g/dL   Albumin 4.3 3.5 - 5.2 g/dL   AST 25 0 - 37 U/L   ALT 24 0 - 53 U/L   Alkaline Phosphatase 99 39 - 117 U/L   Total Bilirubin 0.4 0.3 - 1.2 mg/dL   GFR  calc non Af Amer >90 >90 mL/min   GFR calc Af Amer >90 >90 mL/min   Anion gap 13 5 - 15  I-stat troponin, ED ([facility])  Result Value Ref Range   Troponin i, poc 0.00 0.00 - 0.08 ng/mL   Comment 3            Meds ordered this encounter  Medications  . lisinopril (PRINIVIL,ZESTRIL) 20 MG tablet    Sig: Take 20 mg by mouth daily.    Refill:  5  . albuterol (PROVENTIL) (2.5 MG/3ML) 0.083% nebulizer solution 5 mg    Sig:   Dg Chest 2 View  01/20/2014   CLINICAL DATA:  Syncope last night. Presently with diaphoresis. History of hypertension.  EXAM: CHEST  2 VIEW  COMPARISON:  11/21/2013.  FINDINGS: Cardiac silhouette is normal in size. Mediastinal and  hilar contours.  Clear lungs.  No pleural effusion or pneumothorax.  Bony thorax is intact.  IMPRESSION: No active cardiopulmonary disease.   Electronically Signed   By: Amie Portlandavid  Ormond M.D.   On: 01/20/2014 15:12   Ct Head Wo Contrast  01/20/2014   CLINICAL DATA:  Frontal headache with numbness and tingling in the right hand  EXAM: CT HEAD WITHOUT CONTRAST  TECHNIQUE: Contiguous axial images were obtained from the base of the skull through the vertex without intravenous contrast.  COMPARISON:  11/16/2013  FINDINGS: The bony calvarium is intact. No gross soft tissue abnormality is noted. A stable posterior fusion defect of C1 is seen. This is an anatomic variant. Encephalomalacic changes are noted in the frontal lobes bilaterally stable from the prior exam. Mild atrophic changes are noted. No findings to suggest acute hemorrhage, acute infarction or space-occupying mass lesion are noted. 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 Calcifications of the tentorium are again seen.  IMPRESSION: Stable atrophic changes.  No acute abnormality is noted.  Stable encephalomalacia changes in the frontal lobes bilaterally.   Electronically Signed   By: Alcide CleverMark  Lukens M.D.   On: 01/20/2014 16:19     MDM:   ICD-9-CM ICD-10-CM   1.  Lightheadedness 780.4 R42   2. Dizziness 780.4 R42 DG Chest 2 View     CT Head Wo Contrast     DG Chest 2 View     CT Head Wo Contrast  3. Paresthesias in right hand 782.0 R20.2    4:35 PM CT neg. Pt feeling better. Will D/C with previously outlined plan per Celene Skeenobyn Hess.  Donnita FallsMercedes Strupp Somertonamprubi-Soms, [place] JerseyPA-C 01/20/14 1638  Arby BarretteMarcy Pfeiffer, MD 01/21/14 1651

## 2014-01-20 NOTE — ED Notes (Signed)
Patient transported to CT 

## 2014-01-20 NOTE — ED Notes (Signed)
Patient transported to X-ray 

## 2014-01-20 NOTE — ED Notes (Signed)
Pt. Reports sudden onset of dizziness and right sided hand numbness starting last night at 2100, hx of HTN. Pt. Diaphoretic at triage. Alert and oriented x4. Denies pain. Denies heart history, denies hx of stroke. Grips equal.
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
## 2014-01-21 ENCOUNTER — Ambulatory Visit: Payer: PRIVATE HEALTH INSURANCE

## 2014-01-23 ENCOUNTER — Encounter: Payer: Medicare Other | Admitting: Physical Therapy

## 2014-03-25 ENCOUNTER — Emergency Department (HOSPITAL_COMMUNITY): Payer: Medicare Other

## 2014-03-25 ENCOUNTER — Encounter (HOSPITAL_COMMUNITY): Payer: Self-pay

## 2014-03-25 ENCOUNTER — Emergency Department (HOSPITAL_COMMUNITY)
Admission: EM | Admit: 2014-03-25 | Discharge: 2014-03-25 | Disposition: A | Discharge status: Home / Self Care | Payer: Medicare Other | Attending: Emergency Medicine | Admitting: Emergency Medicine

## 2014-03-25 DIAGNOSIS — I10 Essential (primary) hypertension: Secondary | ICD-10-CM | POA: Insufficient documentation

## 2014-03-25 DIAGNOSIS — Z8719 Personal history of other diseases of the digestive system: Secondary | ICD-10-CM | POA: Diagnosis not present

## 2014-03-25 DIAGNOSIS — R0602 Shortness of breath: Secondary | ICD-10-CM | POA: Diagnosis not present

## 2014-03-25 DIAGNOSIS — Z79899 Other long term (current) drug therapy: Secondary | ICD-10-CM | POA: Diagnosis not present

## 2014-03-25 DIAGNOSIS — N529 Male erectile dysfunction, unspecified: Secondary | ICD-10-CM | POA: Insufficient documentation

## 2014-03-25 DIAGNOSIS — R069 Unspecified abnormalities of breathing: Secondary | ICD-10-CM | POA: Diagnosis not present

## 2014-03-25 DIAGNOSIS — G8929 Other chronic pain: Secondary | ICD-10-CM | POA: Diagnosis not present

## 2014-03-25 DIAGNOSIS — Z872 Personal history of diseases of the skin and subcutaneous tissue: Secondary | ICD-10-CM | POA: Diagnosis not present

## 2014-03-25 DIAGNOSIS — Z8639 Personal history of other endocrine, nutritional and metabolic disease: Secondary | ICD-10-CM | POA: Diagnosis not present

## 2014-03-25 DIAGNOSIS — Z87828 Personal history of other (healed) physical injury and trauma: Secondary | ICD-10-CM | POA: Insufficient documentation

## 2014-03-25 DIAGNOSIS — J441 Chronic obstructive pulmonary disease with (acute) exacerbation: Secondary | ICD-10-CM

## 2014-03-25 DIAGNOSIS — J45909 Unspecified asthma, uncomplicated: Secondary | ICD-10-CM | POA: Diagnosis not present

## 2014-03-25 DIAGNOSIS — Z720 Tobacco use: Secondary | ICD-10-CM | POA: Insufficient documentation

## 2014-03-25 DIAGNOSIS — R0789 Other chest pain: Secondary | ICD-10-CM | POA: Insufficient documentation

## 2014-03-25 DIAGNOSIS — M1990 Unspecified osteoarthritis, unspecified site: Secondary | ICD-10-CM | POA: Insufficient documentation

## 2014-03-25 LAB — CBC WITH DIFFERENTIAL/PLATELET
Basophils Absolute: 0 10*3/uL (ref 0.0–0.1)
Basophils Relative: 1 % (ref 0–1)
Eosinophils Absolute: 0.2 10*3/uL (ref 0.0–0.7)
Eosinophils Relative: 3 % (ref 0–5)
HCT: 42.1 % (ref 39.0–52.0)
Hemoglobin: 14.1 g/dL (ref 13.0–17.0)
Lymphocytes Relative: 19 % (ref 12–46)
Lymphs Abs: 1.2 10*3/uL (ref 0.7–4.0)
MCH: 32.4 pg (ref 26.0–34.0)
MCHC: 33.5 g/dL (ref 30.0–36.0)
MCV: 96.8 fL (ref 78.0–100.0)
Monocytes Absolute: 0.4 10*3/uL (ref 0.1–1.0)
Monocytes Relative: 6 % (ref 3–12)
Neutro Abs: 4.4 10*3/uL (ref 1.7–7.7)
Neutrophils Relative %: 71 % (ref 43–77)
Platelets: 191 10*3/uL (ref 150–400)
RBC: 4.35 MIL/uL (ref 4.22–5.81)
RDW: 13.5 % (ref 11.5–15.5)
WBC: 6.2 10*3/uL (ref 4.0–10.5)

## 2014-03-25 LAB — BASIC METABOLIC PANEL
Anion gap: 12 (ref 5–15)
BUN: 10 mg/dL (ref 6–23)
CO2: 22 mmol/L (ref 19–32)
Calcium: 10 mg/dL (ref 8.4–10.5)
Chloride: 101 mmol/L (ref 96–112)
Creatinine, Ser: 0.89 mg/dL (ref 0.50–1.35)
GFR calc Af Amer: >90 mL/min (ref >90)
GFR calc non Af Amer: >90 mL/min (ref >90)
Glucose, Bld: 100 mg/dL — ABNORMAL HIGH (ref 70–99)
Potassium: 4.1 mmol/L (ref 3.5–5.1)
Sodium: 135 mmol/L (ref 135–145)

## 2014-03-25 MED ORDER — LEVOFLOXACIN 750 MG PO TABS
750.0000 mg | ORAL_TABLET | Freq: Once | ORAL | Status: AC
  Administered 2014-03-25: 750 mg via ORAL
  Filled 2014-03-25: qty 1

## 2014-03-25 MED ORDER — BENZONATATE 100 MG PO CAPS: 100.0000 mg | ORAL_CAPSULE | Freq: Three times a day (TID) | ORAL | Status: DC

## 2014-03-25 MED ORDER — ALBUTEROL (5 MG/ML) CONTINUOUS INHALATION SOLN
10.0000 mg/h | INHALATION_SOLUTION | Freq: Once | RESPIRATORY_TRACT | Status: AC
  Administered 2014-03-25: 10 mg/h via RESPIRATORY_TRACT
  Filled 2014-03-25: qty 20

## 2014-03-25 MED ORDER — PREDNISONE 20 MG PO TABS
60.0000 mg | ORAL_TABLET | Freq: Once | ORAL | Status: AC
Start: 2014-03-25 — End: 2014-03-25
  Administered 2014-03-25: 60 mg via ORAL
  Filled 2014-03-25: qty 3

## 2014-03-25 MED ORDER — ALBUTEROL SULFATE HFA 108 (90 BASE) MCG/ACT IN AERS: 1.0000 | INHALATION_SPRAY | Freq: Four times a day (QID) | RESPIRATORY_TRACT | Status: DC | PRN

## 2014-03-25 MED ORDER — LEVOFLOXACIN 500 MG PO TABS: 500.0000 mg | ORAL_TABLET | Freq: Every day | ORAL | Status: DC

## 2014-03-25 MED ORDER — PREDNISONE 20 MG PO TABS: 20.0000 mg | ORAL_TABLET | Freq: Every day | ORAL | Status: DC
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
## 2014-03-25 NOTE — Discharge Instructions (Signed)

## 2014-03-25 NOTE — ED Notes (Signed)
Patient receiving breathing treatment.

## 2014-03-25 NOTE — ED Notes (Signed)
Pt back from xray.  Nebulizer restarted.

## 2014-03-25 NOTE — ED Notes (Signed)
Patient transported to X-ray 

## 2014-03-25 NOTE — ED Notes (Signed)
To room via ems.  Neb treatment, Albuterol 5mg  just finishing.  Onset several days productive cough, runny nose, shortness of breath.  Pt thinks he has been running fever. Pt has been out of inhaler.  EMS called to pts house yesterday, neb treatment given.  Pt called PCP this morning, inhaler refill called to pharmacy.  Pt did not pick up yet from pharmacy d/t too cold outside and couldn't find nobody to pick up for him.

## 2014-04-16 ENCOUNTER — Ambulatory Visit (INDEPENDENT_AMBULATORY_CARE_PROVIDER_SITE_OTHER): Payer: Medicaid Other | Admitting: Podiatrist

## 2014-04-16 ENCOUNTER — Encounter: Payer: Self-pay | Admitting: Podiatrist

## 2014-04-16 DIAGNOSIS — B351 Tinea unguium: Secondary | ICD-10-CM

## 2014-04-16 DIAGNOSIS — Q828 Other specified congenital malformations of skin: Secondary | ICD-10-CM

## 2014-04-16 DIAGNOSIS — M79673 Pain in unspecified foot: Secondary | ICD-10-CM

## 2014-04-16 NOTE — Progress Notes (Signed)
     HPI:  Patient presents today for foot and nail care. Denies any new complaints today. Relates significant pain with walking and ambulatation.   Objective:  Patients chart is reviewed.  Vascular status reveals pedal pulses noted at 1 out of 4 dp and pt bilateral .  Neurological sensation is Normal to [facility] monofilament bilateral.  Patients nails are severely thickened, discolored, distrophic, friable and brittle with yellow-brown discoloration. Large porokeratotic and hyperkeratotic callouses are present submetatarsal 5 bilaterally and submetatarsal 3 right. Intact integument is noted status post debridement. Patient subjectively relates they are painful with shoes and with ambulation of bilateral feet.  Assessment:  Symptomatic onychomycosis, porokeratotic lesion x3  Plan:  Discussed treatment options and alternatives.  The symptomatic toenails and the lesions were debrided without complication.  Return appointment recommended at routine intervals of 3 months    Marlowe AschoffKathryn Jazz Biddy, DPM

## 2014-04-22 ENCOUNTER — Emergency Department (HOSPITAL_COMMUNITY)
Admission: EM | Admit: 2014-04-22 | Discharge: 2014-04-22 | Disposition: A | Discharge status: Home / Self Care | Payer: Medicare Other | Attending: Emergency Medicine | Admitting: Emergency Medicine

## 2014-04-22 ENCOUNTER — Encounter (HOSPITAL_COMMUNITY): Payer: Self-pay | Admitting: *Deleted

## 2014-04-22 DIAGNOSIS — Z87828 Personal history of other (healed) physical injury and trauma: Secondary | ICD-10-CM | POA: Insufficient documentation

## 2014-04-22 DIAGNOSIS — Z79899 Other long term (current) drug therapy: Secondary | ICD-10-CM | POA: Diagnosis not present

## 2014-04-22 DIAGNOSIS — J45998 Other asthma: Secondary | ICD-10-CM | POA: Diagnosis not present

## 2014-04-22 DIAGNOSIS — Z87438 Personal history of other diseases of male genital organs: Secondary | ICD-10-CM | POA: Insufficient documentation

## 2014-04-22 DIAGNOSIS — M1990 Unspecified osteoarthritis, unspecified site: Secondary | ICD-10-CM | POA: Diagnosis not present

## 2014-04-22 DIAGNOSIS — J45901 Unspecified asthma with (acute) exacerbation: Secondary | ICD-10-CM | POA: Diagnosis not present

## 2014-04-22 DIAGNOSIS — Z872 Personal history of diseases of the skin and subcutaneous tissue: Secondary | ICD-10-CM | POA: Insufficient documentation

## 2014-04-22 DIAGNOSIS — Z792 Long term (current) use of antibiotics: Secondary | ICD-10-CM | POA: Diagnosis not present

## 2014-04-22 DIAGNOSIS — F17210 Nicotine dependence, cigarettes, uncomplicated: Secondary | ICD-10-CM | POA: Diagnosis not present

## 2014-04-22 DIAGNOSIS — Z720 Tobacco use: Secondary | ICD-10-CM | POA: Diagnosis not present

## 2014-04-22 DIAGNOSIS — Z8639 Personal history of other endocrine, nutritional and metabolic disease: Secondary | ICD-10-CM | POA: Insufficient documentation

## 2014-04-22 DIAGNOSIS — Z7952 Long term (current) use of systemic steroids: Secondary | ICD-10-CM | POA: Diagnosis not present

## 2014-04-22 DIAGNOSIS — Z8719 Personal history of other diseases of the digestive system: Secondary | ICD-10-CM | POA: Insufficient documentation

## 2014-04-22 DIAGNOSIS — R0602 Shortness of breath: Secondary | ICD-10-CM | POA: Diagnosis not present

## 2014-04-22 MED ORDER — AEROCHAMBER PLUS W/MASK MISC
1.0000 | Freq: Once | Status: AC
  Administered 2014-04-22: 1

## 2014-04-22 MED ORDER — PREDNISONE 20 MG PO TABS
60.0000 mg | ORAL_TABLET | Freq: Once | ORAL | Status: AC
  Administered 2014-04-22: 60 mg via ORAL
  Filled 2014-04-22: qty 3

## 2014-04-22 MED ORDER — ALBUTEROL SULFATE (2.5 MG/3ML) 0.083% IN NEBU
5.0000 mg | INHALATION_SOLUTION | Freq: Once | RESPIRATORY_TRACT | Status: AC
  Administered 2014-04-22: 5 mg via RESPIRATORY_TRACT
  Filled 2014-04-22: qty 6

## 2014-04-22 MED ORDER — PREDNISONE 20 MG PO TABS: ORAL_TABLET | ORAL | Status: DC

## 2014-04-22 MED ORDER — ALBUTEROL SULFATE HFA 108 (90 BASE) MCG/ACT IN AERS
2.0000 | INHALATION_SPRAY | RESPIRATORY_TRACT | Status: DC | PRN
  Administered 2014-04-22: 2 via RESPIRATORY_TRACT
  Filled 2014-04-22: qty 6.7

## 2014-04-22 NOTE — ED Notes (Signed)
MD at bedside. 

## 2014-04-22 NOTE — Discharge Instructions (Signed)
Asthma Use your albuterol inhaler with spacer 2 puffs every 4 hours as needed for cough or shortness of breath. Return if needed more than every 4 hours. Start taking the prednisone prescribed tomorrow. You have received today's dose. Ask your primary care physician to help you to stop smoking. Get your blood pressure rechecked within the next 3 weeks. Today's was mildly elevated at 155/85 Asthma is a condition of the lungs in which the airways tighten and narrow. Asthma can make it hard to breathe. Asthma cannot be cured, but medicine and lifestyle changes can help control it. Asthma may be started (triggered) by:  Animal skin flakes (dander).  Dust.  Cockroaches.  Pollen.  Mold.  Smoke.  Cleaning products.  Hair sprays or aerosol sprays.  Paint fumes or strong smells.  Cold air, weather changes, and winds.  Crying or laughing hard.  Stress.  Certain medicines or drugs.  Foods, such as dried fruit, potato chips, and sparkling grape juice.  Infections or conditions (colds, flu).  Exercise.  Certain medical conditions or diseases.  Exercise or tiring activities. HOME CARE   Take medicine as told by your doctor.  Use a peak flow meter as told by your doctor. A peak flow meter is a tool that measures how well the lungs are working.  Record and keep track of the peak flow meter's readings.  Understand and use the asthma action plan. An asthma action plan is a written plan for taking care of your asthma and treating your attacks.  To help prevent asthma attacks:  Do not smoke. Stay away from secondhand smoke.  Change your heating and air conditioning filter often.  Limit your use of fireplaces and wood stoves.  Get rid of pests (such as roaches and mice) and their droppings.  Throw away plants if you see mold on them.  Clean your floors. Dust regularly. Use cleaning products that do not smell.  Have someone vacuum when you are not home. Use a vacuum cleaner  with a HEPA filter if possible.  Replace carpet with wood, tile, or vinyl flooring. Carpet can trap animal skin flakes and dust.  Use allergy-proof pillows, mattress covers, and box spring covers.  Wash bed sheets and blankets every week in hot water and dry them in a dryer.  Use blankets that are made of polyester or cotton.  Clean bathrooms and kitchens with bleach. If possible, have someone repaint the walls in these rooms with mold-resistant paint. Keep out of the rooms that are being cleaned and painted.  Wash hands often. GET HELP IF:  You have make a whistling sound when breaking (wheeze), have shortness of breath, or have a cough even if taking medicine to prevent attacks.  The colored mucus you cough up (sputum) is thicker than usual.  The colored mucus you cough up changes from clear or white to yellow, green, gray, or bloody.  You have problems from the medicine you are taking such as:  A rash.  Itching.  Swelling.  Trouble breathing.  You need reliever medicines more than 2-3 times a week.  Your peak flow measurement is still at 50-79% of your personal best after following the action plan for 1 hour.  You have a fever. GET HELP RIGHT AWAY IF:   You seem to be worse and are not responding to medicine during an asthma attack.  You are short of breath even at rest.  You get short of breath when doing very little activity.  You have  trouble eating, drinking, or talking.  You have chest pain.  You have a fast heartbeat.  Your lips or fingernails start to turn blue.  You are light-headed, dizzy, or faint.  Your peak flow is less than 50% of your personal best. MAKE SURE YOU:   Understand these instructions.  Will watch your condition.  Will get help right away if you are not doing well or get worse. Document Released: 07/20/2007 Document Revised: 06/17/2013 Document Reviewed: 08/30/2012 [facility] Patient Information 2015 [employer] [place]. This  information is not intended to replace advice given to you by your health care provider. Make sure you discuss any questions you have with your health care provider.

## 2014-04-22 NOTE — ED Notes (Addendum)
Pt presents via POV c/o SOB beginning last night.  Pt states EMS came to his house last night and gave him a breathing tx but he refused to come to the ER, symptoms became worse this AM.  Pt a x 4, speaking in full, complete sentences. Pt has hx of asthma.
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
## 2014-05-29 DIAGNOSIS — M129 Arthropathy, unspecified: Secondary | ICD-10-CM | POA: Diagnosis not present

## 2014-05-29 DIAGNOSIS — Z125 Encounter for screening for malignant neoplasm of prostate: Secondary | ICD-10-CM | POA: Diagnosis not present

## 2014-05-29 DIAGNOSIS — J4520 Mild intermittent asthma, uncomplicated: Secondary | ICD-10-CM | POA: Diagnosis not present

## 2014-05-29 DIAGNOSIS — Z131 Encounter for screening for diabetes mellitus: Secondary | ICD-10-CM | POA: Diagnosis not present

## 2014-05-29 DIAGNOSIS — Z13220 Encounter for screening for lipoid disorders: Secondary | ICD-10-CM | POA: Diagnosis not present

## 2014-05-29 DIAGNOSIS — F17210 Nicotine dependence, cigarettes, uncomplicated: Secondary | ICD-10-CM | POA: Diagnosis not present

## 2014-05-29 DIAGNOSIS — I10 Essential (primary) hypertension: Secondary | ICD-10-CM | POA: Diagnosis not present

## 2014-06-25 DIAGNOSIS — G894 Chronic pain syndrome: Secondary | ICD-10-CM | POA: Diagnosis not present

## 2014-06-25 DIAGNOSIS — M545 Low back pain: Secondary | ICD-10-CM | POA: Diagnosis not present

## 2014-06-25 DIAGNOSIS — J4520 Mild intermittent asthma, uncomplicated: Secondary | ICD-10-CM | POA: Diagnosis not present

## 2014-06-25 DIAGNOSIS — I10 Essential (primary) hypertension: Secondary | ICD-10-CM | POA: Diagnosis not present

## 2014-06-25 DIAGNOSIS — F17200 Nicotine dependence, unspecified, uncomplicated: Secondary | ICD-10-CM | POA: Diagnosis not present

## 2014-07-14 ENCOUNTER — Emergency Department (HOSPITAL_COMMUNITY): Payer: Medicare Other

## 2014-07-14 ENCOUNTER — Encounter (HOSPITAL_COMMUNITY): Payer: Self-pay | Admitting: *Deleted

## 2014-07-14 ENCOUNTER — Emergency Department (HOSPITAL_COMMUNITY)
Admission: EM | Admit: 2014-07-14 | Discharge: 2014-07-14 | Disposition: A | Discharge status: Home / Self Care | Payer: Medicare Other | Attending: Emergency Medicine | Admitting: Emergency Medicine

## 2014-07-14 DIAGNOSIS — Z8639 Personal history of other endocrine, nutritional and metabolic disease: Secondary | ICD-10-CM | POA: Insufficient documentation

## 2014-07-14 DIAGNOSIS — J45901 Unspecified asthma with (acute) exacerbation: Secondary | ICD-10-CM | POA: Diagnosis not present

## 2014-07-14 DIAGNOSIS — M1990 Unspecified osteoarthritis, unspecified site: Secondary | ICD-10-CM | POA: Insufficient documentation

## 2014-07-14 DIAGNOSIS — Z720 Tobacco use: Secondary | ICD-10-CM | POA: Diagnosis not present

## 2014-07-14 DIAGNOSIS — Z87448 Personal history of other diseases of urinary system: Secondary | ICD-10-CM | POA: Insufficient documentation

## 2014-07-14 DIAGNOSIS — Z87828 Personal history of other (healed) physical injury and trauma: Secondary | ICD-10-CM | POA: Diagnosis not present

## 2014-07-14 DIAGNOSIS — Z872 Personal history of diseases of the skin and subcutaneous tissue: Secondary | ICD-10-CM | POA: Insufficient documentation

## 2014-07-14 DIAGNOSIS — Z79899 Other long term (current) drug therapy: Secondary | ICD-10-CM | POA: Insufficient documentation

## 2014-07-14 DIAGNOSIS — R0602 Shortness of breath: Secondary | ICD-10-CM | POA: Diagnosis not present

## 2014-07-14 DIAGNOSIS — G8929 Other chronic pain: Secondary | ICD-10-CM | POA: Insufficient documentation

## 2014-07-14 DIAGNOSIS — Z8719 Personal history of other diseases of the digestive system: Secondary | ICD-10-CM | POA: Diagnosis not present

## 2014-07-14 DIAGNOSIS — R05 Cough: Secondary | ICD-10-CM | POA: Diagnosis not present

## 2014-07-14 DIAGNOSIS — F17200 Nicotine dependence, unspecified, uncomplicated: Secondary | ICD-10-CM

## 2014-07-14 DIAGNOSIS — R069 Unspecified abnormalities of breathing: Secondary | ICD-10-CM | POA: Diagnosis not present

## 2014-07-14 DIAGNOSIS — F17210 Nicotine dependence, cigarettes, uncomplicated: Secondary | ICD-10-CM | POA: Diagnosis not present

## 2014-07-14 DIAGNOSIS — I10 Essential (primary) hypertension: Secondary | ICD-10-CM | POA: Insufficient documentation

## 2014-07-14 LAB — CBC WITH DIFFERENTIAL/PLATELET
Basophils Absolute: 0.1 10*3/uL (ref 0.0–0.1)
Basophils Relative: 1 % (ref 0–1)
Eosinophils Absolute: 0.3 10*3/uL (ref 0.0–0.7)
Eosinophils Relative: 4 % (ref 0–5)
HCT: 41 % (ref 39.0–52.0)
Hemoglobin: 13.6 g/dL (ref 13.0–17.0)
Lymphocytes Relative: 30 % (ref 12–46)
Lymphs Abs: 2.2 10*3/uL (ref 0.7–4.0)
MCH: 32.6 pg (ref 26.0–34.0)
MCHC: 33.2 g/dL (ref 30.0–36.0)
MCV: 98.3 fL (ref 78.0–100.0)
Monocytes Absolute: 0.7 10*3/uL (ref 0.1–1.0)
Monocytes Relative: 9 % (ref 3–12)
Neutro Abs: 4.1 10*3/uL (ref 1.7–7.7)
Neutrophils Relative %: 56 % (ref 43–77)
Platelets: 205 10*3/uL (ref 150–400)
RBC: 4.17 MIL/uL — ABNORMAL LOW (ref 4.22–5.81)
RDW: 13.1 % (ref 11.5–15.5)
WBC: 7.3 10*3/uL (ref 4.0–10.5)

## 2014-07-14 LAB — BASIC METABOLIC PANEL
Anion gap: 9 (ref 5–15)
BUN: 14 mg/dL (ref 6–20)
CO2: 24 mmol/L (ref 22–32)
Calcium: 9.1 mg/dL (ref 8.9–10.3)
Chloride: 102 mmol/L (ref 101–111)
Creatinine, Ser: 0.84 mg/dL (ref 0.61–1.24)
GFR calc Af Amer: >60 mL/min (ref >60)
GFR calc non Af Amer: >60 mL/min (ref >60)
Glucose, Bld: 80 mg/dL (ref 65–99)
Potassium: 4.1 mmol/L (ref 3.5–5.1)
Sodium: 135 mmol/L (ref 135–145)

## 2014-07-14 LAB — I-STAT TROPONIN, ED: Troponin i, poc: 0.01 ng/mL (ref 0.00–0.08)

## 2014-07-14 LAB — BRAIN NATRIURETIC PEPTIDE: B Natriuretic Peptide: 28.6 pg/mL (ref 0.0–100.0)

## 2014-07-14 MED ORDER — PREDNISONE 20 MG PO TABS: 40.0000 mg | ORAL_TABLET | Freq: Every day | ORAL | Status: DC

## 2014-07-14 MED ORDER — ALBUTEROL SULFATE HFA 108 (90 BASE) MCG/ACT IN AERS
2.0000 | INHALATION_SPRAY | Freq: Once | RESPIRATORY_TRACT | Status: AC
  Administered 2014-07-14: 2 via RESPIRATORY_TRACT
  Filled 2014-07-14: qty 6.7

## 2014-07-14 MED ORDER — IPRATROPIUM-ALBUTEROL 0.5-2.5 (3) MG/3ML IN SOLN
3.0000 mL | Freq: Once | RESPIRATORY_TRACT | Status: AC
  Administered 2014-07-14: 3 mL via RESPIRATORY_TRACT
  Filled 2014-07-14: qty 3
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
## 2014-07-14 NOTE — Discharge Instructions (Signed)
Please follow with your primary care doctor in the next 2 days for a check-up. They must obtain records for further management.  ° °Do not hesitate to return to the Emergency Department for any new, worsening or concerning symptoms.  ° ° °Asthma Attack Prevention °Although there is no way to prevent asthma from starting, you can take steps to control the disease and reduce its symptoms. Learn about your asthma and how to control it. Take an active role to control your asthma by working with your health care provider to create and follow an asthma action plan. An asthma action plan guides you in: °· Taking your medicines properly. °· Avoiding things that set off your asthma or make your asthma worse (asthma triggers). °· Tracking your level of asthma control. °· Responding to worsening asthma. °· Seeking emergency care when needed. °To track your asthma, keep records of your symptoms, check your peak flow number using a handheld device that shows how well air moves out of your lungs (peak flow meter), and get regular asthma checkups.  °WHAT ARE SOME WAYS TO PREVENT AN ASTHMA ATTACK? °· Take medicines as directed by your health care provider. °· Keep track of your asthma symptoms and level of control. °· With your health care provider, write a detailed plan for taking medicines and managing an asthma attack. Then be sure to follow your action plan. Asthma is an ongoing condition that needs regular monitoring and treatment. °· Identify and avoid asthma triggers. Many outdoor allergens and irritants (such as pollen, mold, cold air, and air pollution) can trigger asthma attacks. Find out what your asthma triggers are and take steps to avoid them. °· Monitor your breathing. Learn to recognize warning signs of an attack, such as coughing, wheezing, or shortness of breath. Your lung function may decrease before you notice any signs or symptoms, so regularly measure and record your peak airflow with a home peak flow  meter. °· Identify and treat attacks early. If you act quickly, you are less likely to have a severe attack. You will also need less medicine to control your symptoms. When your peak flow measurements decrease and alert you to an upcoming attack, take your medicine as instructed and immediately stop any activity that may have triggered the attack. If your symptoms do not improve, get medical help. °· Pay attention to increasing quick-relief inhaler use. If you find yourself relying on your quick-relief inhaler, your asthma is not under control. See your health care provider about adjusting your treatment. °WHAT CAN MAKE MY SYMPTOMS WORSE? °A number of common things can set off or make your asthma symptoms worse and cause temporary increased inflammation of your airways. Keep track of your asthma symptoms for several weeks, detailing all the environmental and emotional factors that are linked with your asthma. When you have an asthma attack, go back to your asthma diary to see which factor, or combination of factors, might have contributed to it. Once you know what these factors are, you can take steps to control many of them. If you have allergies and asthma, it is important to take asthma prevention steps at home. Minimizing contact with the substance to which you are allergic will help prevent an asthma attack. Some triggers and ways to avoid these triggers are: °Animal Dander:  °Some people are allergic to the flakes of skin or dried saliva from animals with fur or feathers.  °· There is no such thing as a hypoallergenic dog or cat breed. All   dogs or cats can cause allergies, even if they don't shed. °· Keep these pets out of your home. °· If you are not able to keep a pet outdoors, keep the pet out of your bedroom and other sleeping areas at all times, and keep the door closed. °· Remove carpets and furniture covered with cloth from your home. If that is not possible, keep the pet away from fabric-covered  furniture and carpets. °Dust Mites: °Many people with asthma are allergic to dust mites. Dust mites are tiny bugs that are found in every home in mattresses, pillows, carpets, fabric-covered furniture, bedcovers, clothes, stuffed toys, and other fabric-covered items.  °· Cover your mattress in a special dust-proof cover. °· Cover your pillow in a special dust-proof cover, or wash the pillow each week in hot water. Water must be hotter than 130° F (54.4° C) to kill dust mites. Cold or warm water used with detergent and bleach can also be effective. °· Wash the sheets and blankets on your bed each week in hot water. °· Try not to sleep or lie on cloth-covered cushions. °· Call ahead when traveling and ask for a smoke-free hotel room. Bring your own bedding and pillows in case the hotel only supplies feather pillows and down comforters, which may contain dust mites and cause asthma symptoms. °· Remove carpets from your bedroom and those laid on concrete, if you can. °· Keep stuffed toys out of the bed, or wash the toys weekly in hot water or cooler water with detergent and bleach. °Cockroaches: °Many people with asthma are allergic to the droppings and remains of cockroaches.  °· Keep food and garbage in closed containers. Never leave food out. °· Use poison baits, traps, powders, gels, or paste (for example, boric acid). °· If a spray is used to kill cockroaches, stay out of the room until the odor goes away. °Indoor Mold: °· Fix leaky faucets, pipes, or other sources of water that have mold around them. °· Clean floors and moldy surfaces with a fungicide or diluted bleach. °· Avoid using humidifiers, vaporizers, or swamp coolers. These can spread molds through the air. °Pollen and Outdoor Mold: °· When pollen or mold spore counts are high, try to keep your windows closed. °· Stay indoors with windows closed from late morning to afternoon. Pollen and some mold spore counts are highest at that time. °· Ask your health  care provider whether you need to take anti-inflammatory medicine or increase your dose of the medicine before your allergy season starts. °Other Irritants to Avoid: °· Tobacco smoke is an irritant. If you smoke, ask your health care provider how you can quit. Ask family members to quit smoking, too. Do not allow smoking in your home or car. °· If possible, do not use a wood-burning stove, kerosene heater, or fireplace. Minimize exposure to all sources of smoke, including incense, candles, fires, and fireworks. °· Try to stay away from strong odors and sprays, such as perfume, talcum powder, hair spray, and paints. °· Decrease humidity in your home and use an indoor air cleaning device. Reduce indoor humidity to below 60%. Dehumidifiers or central air conditioners can do this. °· Decrease house dust exposure by changing furnace and air cooler filters frequently. °· Try to have someone else vacuum for you once or twice a week. Stay out of rooms while they are being vacuumed and for a short while afterward. °· If you vacuum, use a dust mask from a hardware store, a double-layered   or microfilter vacuum cleaner bag, or a vacuum cleaner with a HEPA filter. °· Sulfites in foods and beverages can be irritants. Do not drink beer or wine or eat dried fruit, processed potatoes, or shrimp if they cause asthma symptoms. °· Cold air can trigger an asthma attack. Cover your nose and mouth with a scarf on cold or windy days. °· Several health conditions can make asthma more difficult to manage, including a runny nose, sinus infections, reflux disease, psychological stress, and sleep apnea. Work with your health care provider to manage these conditions. °· Avoid close contact with people who have a respiratory infection such as a cold or the flu, since your asthma symptoms may get worse if you catch the infection. Wash your hands thoroughly after touching items that may have been handled by people with a respiratory  infection. °· Get a flu shot every year to protect against the flu virus, which often makes asthma worse for days or weeks. Also get a pneumonia shot if you have not previously had one. Unlike the flu shot, the pneumonia shot does not need to be given yearly. °Medicines: °· Talk to your health care provider about whether it is safe for you to take aspirin or non-steroidal anti-inflammatory medicines (NSAIDs). In a small number of people with asthma, aspirin and NSAIDs can cause asthma attacks. These medicines must be avoided by people who have known aspirin-sensitive asthma. It is important that people with aspirin-sensitive asthma read labels of all over-the-counter medicines used to treat pain, colds, coughs, and fever. °· Beta-blockers and ACE inhibitors are other medicines you should discuss with your health care provider. °HOW CAN I FIND OUT WHAT I AM ALLERGIC TO? °Ask your asthma health care provider about allergy skin testing or blood testing (the RAST test) to identify the allergens to which you are sensitive. If you are found to have allergies, the most important thing to do is to try to avoid exposure to any allergens that you are sensitive to as much as possible. Other treatments for allergies, such as medicines and allergy shots (immunotherapy) are available.  °CAN I EXERCISE? °Follow your health care provider's advice regarding asthma treatment before exercising. It is important to maintain a regular exercise program, but vigorous exercise or exercise in cold, humid, or dry environments can cause asthma attacks, especially for those people who have exercise-induced asthma. °Document Released: 01/19/2009 Document Revised: 02/05/2013 Document Reviewed: 08/08/2012 °[employer]® Patient Information ©2015 [employer]. This information is not intended to replace advice given to you by your health care provider. Make sure you discuss any questions you have with your health care provider. ° °

## 2014-07-14 NOTE — ED Notes (Signed)
Patient presents via EMS with c/o SOB  Stated he started getting SOB a couple of hours ago.  EMS reported 98% sats on RA, 100% during albuterol treatment  EMS adm 5mg  Albuterol neb and Solumedriol 125mg  IV

## 2014-07-25 DIAGNOSIS — J4520 Mild intermittent asthma, uncomplicated: Secondary | ICD-10-CM | POA: Diagnosis not present

## 2014-08-06 DIAGNOSIS — G894 Chronic pain syndrome: Secondary | ICD-10-CM | POA: Diagnosis not present

## 2014-08-06 DIAGNOSIS — M545 Low back pain: Secondary | ICD-10-CM | POA: Diagnosis not present

## 2014-08-06 DIAGNOSIS — I10 Essential (primary) hypertension: Secondary | ICD-10-CM | POA: Diagnosis not present

## 2014-08-06 DIAGNOSIS — J4520 Mild intermittent asthma, uncomplicated: Secondary | ICD-10-CM | POA: Diagnosis not present

## 2014-08-22 ENCOUNTER — Encounter (HOSPITAL_COMMUNITY): Payer: Self-pay | Admitting: *Deleted

## 2014-08-22 ENCOUNTER — Emergency Department (HOSPITAL_COMMUNITY): Payer: Medicare Other

## 2014-08-22 ENCOUNTER — Emergency Department (HOSPITAL_COMMUNITY)
Admission: EM | Admit: 2014-08-22 | Discharge: 2014-08-22 | Disposition: A | Discharge status: Home / Self Care | Payer: Medicare Other | Attending: Emergency Medicine | Admitting: Emergency Medicine

## 2014-08-22 DIAGNOSIS — Z720 Tobacco use: Secondary | ICD-10-CM | POA: Diagnosis not present

## 2014-08-22 DIAGNOSIS — M1990 Unspecified osteoarthritis, unspecified site: Secondary | ICD-10-CM | POA: Insufficient documentation

## 2014-08-22 DIAGNOSIS — Z872 Personal history of diseases of the skin and subcutaneous tissue: Secondary | ICD-10-CM | POA: Insufficient documentation

## 2014-08-22 DIAGNOSIS — Z7951 Long term (current) use of inhaled steroids: Secondary | ICD-10-CM | POA: Insufficient documentation

## 2014-08-22 DIAGNOSIS — R0789 Other chest pain: Secondary | ICD-10-CM | POA: Insufficient documentation

## 2014-08-22 DIAGNOSIS — Z7952 Long term (current) use of systemic steroids: Secondary | ICD-10-CM | POA: Insufficient documentation

## 2014-08-22 DIAGNOSIS — I10 Essential (primary) hypertension: Secondary | ICD-10-CM | POA: Insufficient documentation

## 2014-08-22 DIAGNOSIS — Z87828 Personal history of other (healed) physical injury and trauma: Secondary | ICD-10-CM | POA: Diagnosis not present

## 2014-08-22 DIAGNOSIS — Z8639 Personal history of other endocrine, nutritional and metabolic disease: Secondary | ICD-10-CM | POA: Insufficient documentation

## 2014-08-22 DIAGNOSIS — J4541 Moderate persistent asthma with (acute) exacerbation: Secondary | ICD-10-CM | POA: Diagnosis not present

## 2014-08-22 DIAGNOSIS — G8929 Other chronic pain: Secondary | ICD-10-CM | POA: Insufficient documentation

## 2014-08-22 DIAGNOSIS — N529 Male erectile dysfunction, unspecified: Secondary | ICD-10-CM | POA: Insufficient documentation

## 2014-08-22 DIAGNOSIS — Z8719 Personal history of other diseases of the digestive system: Secondary | ICD-10-CM | POA: Insufficient documentation

## 2014-08-22 DIAGNOSIS — R0602 Shortness of breath: Secondary | ICD-10-CM | POA: Diagnosis present

## 2014-08-22 DIAGNOSIS — F17210 Nicotine dependence, cigarettes, uncomplicated: Secondary | ICD-10-CM | POA: Diagnosis not present

## 2014-08-22 LAB — BASIC METABOLIC PANEL
Anion gap: 9 (ref 5–15)
Anion gap: 9 (ref 5–15)
BUN: 14 mg/dL (ref 6–20)
BUN: 14 mg/dL (ref 6–20)
CO2: 23 mmol/L (ref 22–32)
CO2: 25 mmol/L (ref 22–32)
Calcium: 9.4 mg/dL (ref 8.9–10.3)
Calcium: 9.7 mg/dL (ref 8.9–10.3)
Chloride: 103 mmol/L (ref 101–111)
Chloride: 104 mmol/L (ref 101–111)
Creatinine, Ser: 0.95 mg/dL (ref 0.61–1.24)
Creatinine, Ser: 0.87 mg/dL (ref 0.61–1.24)
GFR calc Af Amer: >60 mL/min (ref >60)
GFR calc Af Amer: >60 mL/min (ref >60)
GFR calc non Af Amer: >60 mL/min (ref >60)
GFR calc non Af Amer: >60 mL/min (ref >60)
Glucose, Bld: 87 mg/dL (ref 65–99)
Glucose, Bld: 107 mg/dL — ABNORMAL HIGH (ref 65–99)
Potassium: 6.2 mmol/L (ref 3.5–5.1)
Potassium: 3.8 mmol/L (ref 3.5–5.1)
Sodium: 135 mmol/L (ref 135–145)
Sodium: 138 mmol/L (ref 135–145)

## 2014-08-22 LAB — CBC
HCT: 40.6 % (ref 39.0–52.0)
Hemoglobin: 13.6 g/dL (ref 13.0–17.0)
MCH: 33.3 pg (ref 26.0–34.0)
MCHC: 33.5 g/dL (ref 30.0–36.0)
MCV: 99.5 fL (ref 78.0–100.0)
Platelets: 159 10*3/uL (ref 150–400)
RBC: 4.08 MIL/uL — ABNORMAL LOW (ref 4.22–5.81)
RDW: 13.8 % (ref 11.5–15.5)
WBC: 7.2 10*3/uL (ref 4.0–10.5)

## 2014-08-22 LAB — I-STAT TROPONIN, ED: Troponin i, poc: 0 ng/mL (ref 0.00–0.08)

## 2014-08-22 LAB — BRAIN NATRIURETIC PEPTIDE: B Natriuretic Peptide: 24.5 pg/mL (ref 0.0–100.0)

## 2014-08-22 MED ORDER — ALBUTEROL SULFATE HFA 108 (90 BASE) MCG/ACT IN AERS: 1.0000 | INHALATION_SPRAY | Freq: Four times a day (QID) | RESPIRATORY_TRACT | Status: DC | PRN

## 2014-08-22 MED ORDER — ALBUTEROL (5 MG/ML) CONTINUOUS INHALATION SOLN
10.0000 mg/h | INHALATION_SOLUTION | Freq: Once | RESPIRATORY_TRACT | Status: AC
  Administered 2014-08-22: 10 mg/h via RESPIRATORY_TRACT
  Filled 2014-08-22: qty 20

## 2014-08-22 MED ORDER — SODIUM CHLORIDE 0.9 % IV SOLN
INTRAVENOUS | Status: DC
  Filled 2014-08-22: qty 2.5

## 2014-08-22 MED ORDER — ALBUTEROL SULFATE (2.5 MG/3ML) 0.083% IN NEBU: 2.5000 mg | INHALATION_SOLUTION | RESPIRATORY_TRACT | Status: DC | PRN

## 2014-08-22 MED ORDER — PREDNISONE 20 MG PO TABS: ORAL_TABLET | ORAL | Status: DC

## 2014-08-22 MED ORDER — METHYLPREDNISOLONE SODIUM SUCC 125 MG IJ SOLR
125.0000 mg | Freq: Once | INTRAMUSCULAR | Status: AC
  Administered 2014-08-22: 125 mg via INTRAVENOUS
  Filled 2014-08-22: qty 2

## 2014-08-22 NOTE — Discharge Instructions (Signed)
If you were given medicines take as directed.  If you are on coumadin or contraceptives realize their levels and effectiveness is altered by many different medicines.  If you have any reaction (rash, tongues swelling, other) to the medicines stop taking and see a physician.    If your blood pressure was elevated in the ER make sure you follow up for management with a primary doctor or return for chest pain, shortness of breath or stroke symptoms.  Please follow up as directed and return to the ER or see a physician for new or worsening symptoms.  Thank you. Filed Vitals:   08/22/14 1437 08/22/14 1440 08/22/14 1510 08/22/14 1545  BP: 140/80  153/71 150/70  Pulse: 67  61 87  Temp:      TempSrc:      Resp: 25  20 20   SpO2: 97% 100% 100% 99%

## 2014-08-22 NOTE — ED Notes (Signed)
Pt finished HHN.  Yellow socks and fall risk armband placed on pt.  Pt st's he feels much better after neb ts.

## 2014-08-22 NOTE — ED Notes (Signed)
Critical Potassium -- hemolyzed sample. Phlebotomy to redraw
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
## 2014-08-22 NOTE — ED Notes (Signed)
Pt reports having asthma and cough x several days, no relief with inhalers and neb tx at home. Pt reports increase in sob today and pt is very diaphoretic at triage. spo2 98% and ekg done.

## 2014-09-11 DIAGNOSIS — J4520 Mild intermittent asthma, uncomplicated: Secondary | ICD-10-CM | POA: Diagnosis not present

## 2014-09-11 DIAGNOSIS — I10 Essential (primary) hypertension: Secondary | ICD-10-CM | POA: Diagnosis not present

## 2014-09-11 DIAGNOSIS — F17210 Nicotine dependence, cigarettes, uncomplicated: Secondary | ICD-10-CM | POA: Diagnosis not present

## 2014-09-11 DIAGNOSIS — M545 Low back pain: Secondary | ICD-10-CM | POA: Diagnosis not present

## 2014-09-11 DIAGNOSIS — G894 Chronic pain syndrome: Secondary | ICD-10-CM | POA: Diagnosis not present

## 2014-10-07 DIAGNOSIS — J4520 Mild intermittent asthma, uncomplicated: Secondary | ICD-10-CM | POA: Diagnosis not present

## 2014-10-09 DIAGNOSIS — J4520 Mild intermittent asthma, uncomplicated: Secondary | ICD-10-CM | POA: Diagnosis not present

## 2014-10-09 DIAGNOSIS — L723 Sebaceous cyst: Secondary | ICD-10-CM | POA: Diagnosis not present

## 2014-10-09 DIAGNOSIS — F17210 Nicotine dependence, cigarettes, uncomplicated: Secondary | ICD-10-CM | POA: Diagnosis not present

## 2014-10-09 DIAGNOSIS — M545 Low back pain: Secondary | ICD-10-CM | POA: Diagnosis not present

## 2014-11-13 DIAGNOSIS — R269 Unspecified abnormalities of gait and mobility: Secondary | ICD-10-CM | POA: Diagnosis not present

## 2014-11-13 DIAGNOSIS — J4520 Mild intermittent asthma, uncomplicated: Secondary | ICD-10-CM | POA: Diagnosis not present

## 2014-11-13 DIAGNOSIS — G894 Chronic pain syndrome: Secondary | ICD-10-CM | POA: Diagnosis not present

## 2014-11-13 DIAGNOSIS — I10 Essential (primary) hypertension: Secondary | ICD-10-CM | POA: Diagnosis not present

## 2014-11-13 DIAGNOSIS — M545 Low back pain: Secondary | ICD-10-CM | POA: Diagnosis not present

## 2014-11-13 DIAGNOSIS — M179 Osteoarthritis of knee, unspecified: Secondary | ICD-10-CM | POA: Diagnosis not present

## 2014-11-19 DIAGNOSIS — J4520 Mild intermittent asthma, uncomplicated: Secondary | ICD-10-CM | POA: Diagnosis not present

## 2014-11-28 DIAGNOSIS — M170 Bilateral primary osteoarthritis of knee: Secondary | ICD-10-CM | POA: Diagnosis not present

## 2014-12-11 DIAGNOSIS — F17200 Nicotine dependence, unspecified, uncomplicated: Secondary | ICD-10-CM | POA: Diagnosis not present

## 2014-12-11 DIAGNOSIS — G894 Chronic pain syndrome: Secondary | ICD-10-CM | POA: Diagnosis not present

## 2014-12-11 DIAGNOSIS — M179 Osteoarthritis of knee, unspecified: Secondary | ICD-10-CM | POA: Diagnosis not present

## 2014-12-11 DIAGNOSIS — I10 Essential (primary) hypertension: Secondary | ICD-10-CM | POA: Diagnosis not present

## 2014-12-11 DIAGNOSIS — J4520 Mild intermittent asthma, uncomplicated: Secondary | ICD-10-CM | POA: Diagnosis not present

## 2014-12-24 DIAGNOSIS — J4520 Mild intermittent asthma, uncomplicated: Secondary | ICD-10-CM | POA: Diagnosis not present

## 2015-01-07 DIAGNOSIS — G894 Chronic pain syndrome: Secondary | ICD-10-CM | POA: Diagnosis not present

## 2015-01-07 DIAGNOSIS — I10 Essential (primary) hypertension: Secondary | ICD-10-CM | POA: Diagnosis not present

## 2015-01-07 DIAGNOSIS — M179 Osteoarthritis of knee, unspecified: Secondary | ICD-10-CM | POA: Diagnosis not present

## 2015-01-14 DIAGNOSIS — M170 Bilateral primary osteoarthritis of knee: Secondary | ICD-10-CM | POA: Diagnosis not present

## 2015-01-20 DIAGNOSIS — J4520 Mild intermittent asthma, uncomplicated: Secondary | ICD-10-CM | POA: Diagnosis not present

## 2015-02-04 DIAGNOSIS — I10 Essential (primary) hypertension: Secondary | ICD-10-CM | POA: Diagnosis not present

## 2015-02-04 DIAGNOSIS — G894 Chronic pain syndrome: Secondary | ICD-10-CM | POA: Diagnosis not present

## 2015-02-04 DIAGNOSIS — J4520 Mild intermittent asthma, uncomplicated: Secondary | ICD-10-CM | POA: Diagnosis not present

## 2015-02-04 DIAGNOSIS — M179 Osteoarthritis of knee, unspecified: Secondary | ICD-10-CM | POA: Diagnosis not present

## 2015-02-13 DIAGNOSIS — M170 Bilateral primary osteoarthritis of knee: Secondary | ICD-10-CM | POA: Diagnosis not present

## 2015-04-27 ENCOUNTER — Encounter (HOSPITAL_COMMUNITY): Payer: Self-pay | Admitting: Family Medicine

## 2015-04-27 ENCOUNTER — Emergency Department (HOSPITAL_COMMUNITY): Payer: Medicare Other

## 2015-04-27 ENCOUNTER — Ambulatory Visit: Payer: Medicare Other | Admitting: Sports Medicine

## 2015-04-27 ENCOUNTER — Emergency Department (HOSPITAL_COMMUNITY)
Admission: EM | Admit: 2015-04-27 | Discharge: 2015-04-27 | Disposition: A | Discharge status: Home / Self Care | Payer: Medicare Other | Attending: Emergency Medicine | Admitting: Emergency Medicine

## 2015-04-27 DIAGNOSIS — Z87828 Personal history of other (healed) physical injury and trauma: Secondary | ICD-10-CM | POA: Insufficient documentation

## 2015-04-27 DIAGNOSIS — M25462 Effusion, left knee: Secondary | ICD-10-CM | POA: Insufficient documentation

## 2015-04-27 DIAGNOSIS — J45909 Unspecified asthma, uncomplicated: Secondary | ICD-10-CM | POA: Insufficient documentation

## 2015-04-27 DIAGNOSIS — Z8719 Personal history of other diseases of the digestive system: Secondary | ICD-10-CM | POA: Diagnosis not present

## 2015-04-27 DIAGNOSIS — M1990 Unspecified osteoarthritis, unspecified site: Secondary | ICD-10-CM | POA: Diagnosis not present

## 2015-04-27 DIAGNOSIS — G8929 Other chronic pain: Secondary | ICD-10-CM | POA: Diagnosis not present

## 2015-04-27 DIAGNOSIS — Z7952 Long term (current) use of systemic steroids: Secondary | ICD-10-CM | POA: Insufficient documentation

## 2015-04-27 DIAGNOSIS — E559 Vitamin D deficiency, unspecified: Secondary | ICD-10-CM | POA: Insufficient documentation

## 2015-04-27 DIAGNOSIS — N529 Male erectile dysfunction, unspecified: Secondary | ICD-10-CM | POA: Diagnosis not present

## 2015-04-27 DIAGNOSIS — F17210 Nicotine dependence, cigarettes, uncomplicated: Secondary | ICD-10-CM | POA: Insufficient documentation

## 2015-04-27 DIAGNOSIS — I10 Essential (primary) hypertension: Secondary | ICD-10-CM | POA: Diagnosis not present

## 2015-04-27 DIAGNOSIS — M25531 Pain in right wrist: Secondary | ICD-10-CM | POA: Diagnosis not present

## 2015-04-27 DIAGNOSIS — Z872 Personal history of diseases of the skin and subcutaneous tissue: Secondary | ICD-10-CM | POA: Diagnosis not present

## 2015-04-27 DIAGNOSIS — M25562 Pain in left knee: Secondary | ICD-10-CM | POA: Diagnosis present

## 2015-04-27 MED ORDER — KETOROLAC TROMETHAMINE 60 MG/2ML IM SOLN
30.0000 mg | Freq: Once | INTRAMUSCULAR | Status: AC
  Administered 2015-04-27: 30 mg via INTRAMUSCULAR
  Filled 2015-04-27: qty 2

## 2015-04-27 NOTE — Discharge Instructions (Signed)
You have been seen today for knee pain. Follow up with orthopedics as soon as possible. Call the office using the number provided. Follow up with PCP as needed. Return to ED should symptoms worsen. Anti-inflammatory medications or Tylenol may be used for pain. Keep the extremity elevated when not in use. Keep the compression sleeve in place as much as possible.

## 2015-04-27 NOTE — ED Notes (Signed)
Declined W/C at D/C and was escorted to lobby by RN. 
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
## 2015-04-27 NOTE — ED Notes (Signed)
Pt here for knot to right wrist and left lateral knee. sts painful to walk.

## 2015-06-18 ENCOUNTER — Encounter (HOSPITAL_COMMUNITY): Payer: Self-pay | Admitting: Orthopedic Surgery

## 2015-06-18 DIAGNOSIS — J449 Chronic obstructive pulmonary disease, unspecified: Secondary | ICD-10-CM | POA: Diagnosis present

## 2015-06-18 DIAGNOSIS — I10 Essential (primary) hypertension: Secondary | ICD-10-CM | POA: Diagnosis present

## 2015-06-18 DIAGNOSIS — M1712 Unilateral primary osteoarthritis, left knee: Secondary | ICD-10-CM | POA: Diagnosis present
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
## 2015-06-23 ENCOUNTER — Encounter: Payer: Self-pay | Admitting: Sports Medicine

## 2015-06-23 ENCOUNTER — Ambulatory Visit (INDEPENDENT_AMBULATORY_CARE_PROVIDER_SITE_OTHER): Payer: Medicare Other | Admitting: Sports Medicine

## 2015-06-23 DIAGNOSIS — M79673 Pain in unspecified foot: Secondary | ICD-10-CM | POA: Diagnosis not present

## 2015-06-23 DIAGNOSIS — B351 Tinea unguium: Secondary | ICD-10-CM | POA: Diagnosis not present

## 2015-06-23 DIAGNOSIS — Q828 Other specified congenital malformations of skin: Secondary | ICD-10-CM

## 2015-06-23 NOTE — Progress Notes (Signed)
Patient ID: [name], male   DOB: [birth year], 60 y.o.   MRN: [id number] Subjective: [name] is a 60 y.o. male patient seen today in office with complaint of painful callus and thickened and elongated toenails; unable to trim. Patient denies history of Diabetes, Neuropathy, or Vascular disease. Patient has no other pedal complaints at this time.   Patient Active Problem List   Diagnosis Date Noted  . Primary osteoarthritis of left knee 06/18/2015  . Chronic obstructive airway disease with asthma ([place]) 06/18/2015  . Essential hypertension 06/18/2015  . Special screening for malignant neoplasms, colon 12/11/2012    Current Outpatient Prescriptions on File Prior to Visit  Medication Sig Dispense Refill  . acetaminophen (TYLENOL) 325 MG tablet Take 325 mg by mouth every 6 (six) hours as needed for moderate pain.    Marland Kitchen albuterol (PROVENTIL HFA;VENTOLIN HFA) 108 (90 BASE) MCG/ACT inhaler Inhale 1-2 puffs into the lungs every 6 (six) hours as needed for wheezing. 1 Inhaler 0  . albuterol (PROVENTIL HFA;VENTOLIN HFA) 108 (90 BASE) MCG/ACT inhaler Inhale 1-2 puffs into the lungs every 6 (six) hours as needed for wheezing or shortness of breath. 1 Inhaler 0  . albuterol (PROVENTIL) (2.5 MG/3ML) 0.083% nebulizer solution Take 3 mLs (2.5 mg total) by nebulization every 4 (four) hours as needed for wheezing or shortness of breath. 30 vial 0  . HYDROcodone-acetaminophen (NORCO/VICODIN) 5-325 MG per tablet Take 1 tablet by mouth every 6 (six) hours as needed for moderate pain.    Marland Kitchen lisinopril (PRINIVIL,ZESTRIL) 20 MG tablet Take 20 mg by mouth daily.  5  . predniSONE (DELTASONE) 20 MG tablet Take 2 tablets (40 mg total) by mouth daily. 10 tablet 0  . predniSONE (DELTASONE) 20 MG tablet 2 tabs po daily x 4 days 8 tablet 0  . sildenafil (VIAGRA) 50 MG tablet Take 50 mg by mouth as needed for erectile dysfunction.     No current facility-administered medications on file prior to visit.    No  Known Allergies  Objective: Physical Exam  General: Well developed, nourished, no acute distress, awake, alert and oriented x 3  Vascular: Dorsalis pedis artery 1/4 bilateral, Posterior tibial artery 1/4 bilateral, skin temperature warm to warm proximal to distal bilateral lower extremities, no varicosities, scant pedal hair present bilateral.  Neurological: Gross sensation present via light touch bilateral.   Dermatological: Skin is warm, dry, and supple bilateral, Nails 1-10 are tender, long, thick, and discolored with severe subungal debris, no webspace macerations present bilateral, no open lesions present bilateral, +callus/hyperkeratotic tissue present bilateral sub met 5 and right sub met 2. No signs of infection bilateral.  Musculoskeletal: No symptomatic boney deformities noted bilateral. Muscular strength within normal limits without pain on range of motion. No pain with calf compression bilateral.  Assessment and Plan:  Problem List Items Addressed This Visit    None    Visit Diagnoses    Dermatophytosis of nail    -  Primary    Porokeratosis        Foot pain, unspecified laterality           -Examined patient.  -Discussed treatment options for painful callus and mycotic nails. -Mechanically debrided keratosis x 3 using sterile chisel blade and reduced mycotic nails with sterile nail nipper and dremel nail file without incident. -Recommend daily skin emollients -Recommend good supportive shoes and inserts  -Patient to return in 3 months for follow up evaluation or sooner if symptoms worsen.  Landis Martins, DPM

## 2015-06-26 ENCOUNTER — Encounter (HOSPITAL_COMMUNITY)
Admission: RE | Admit: 2015-06-26 | Discharge: 2015-06-26 | Disposition: A | Discharge status: Home / Self Care | Payer: Medicare Other | Source: Ambulatory Visit | Attending: Orthopedic Surgery | Admitting: Orthopedic Surgery

## 2015-06-26 ENCOUNTER — Ambulatory Visit (HOSPITAL_COMMUNITY)
Admission: RE | Admit: 2015-06-26 | Discharge: 2015-06-26 | Disposition: A | Discharge status: Home / Self Care | Payer: Medicare Other | Source: Ambulatory Visit | Attending: Orthopedic Surgery | Admitting: Orthopedic Surgery

## 2015-06-26 ENCOUNTER — Encounter (HOSPITAL_COMMUNITY): Payer: Self-pay

## 2015-06-26 DIAGNOSIS — Z01812 Encounter for preprocedural laboratory examination: Secondary | ICD-10-CM | POA: Diagnosis not present

## 2015-06-26 DIAGNOSIS — R05 Cough: Secondary | ICD-10-CM | POA: Diagnosis not present

## 2015-06-26 DIAGNOSIS — Z01818 Encounter for other preprocedural examination: Secondary | ICD-10-CM | POA: Diagnosis not present

## 2015-06-26 DIAGNOSIS — R059 Cough, unspecified: Secondary | ICD-10-CM

## 2015-06-26 HISTORY — DX: Unspecified convulsions: R56.9

## 2015-06-26 LAB — CBC WITH DIFFERENTIAL/PLATELET
Basophils Absolute: 0.1 10*3/uL (ref 0.0–0.1)
Basophils Relative: 1 %
Eosinophils Absolute: 0.3 10*3/uL (ref 0.0–0.7)
Eosinophils Relative: 3 %
HCT: 41.8 % (ref 39.0–52.0)
Hemoglobin: 14.1 g/dL (ref 13.0–17.0)
Lymphocytes Relative: 18 %
Lymphs Abs: 1.9 10*3/uL (ref 0.7–4.0)
MCH: 33.9 pg (ref 26.0–34.0)
MCHC: 33.7 g/dL (ref 30.0–36.0)
MCV: 100.5 fL — ABNORMAL HIGH (ref 78.0–100.0)
Monocytes Absolute: 0.6 10*3/uL (ref 0.1–1.0)
Monocytes Relative: 6 %
Neutro Abs: 7.4 10*3/uL (ref 1.7–7.7)
Neutrophils Relative %: 72 %
Platelets: 206 10*3/uL (ref 150–400)
RBC: 4.16 MIL/uL — ABNORMAL LOW (ref 4.22–5.81)
RDW: 14 % (ref 11.5–15.5)
WBC: 10.1 10*3/uL (ref 4.0–10.5)

## 2015-06-26 LAB — COMPREHENSIVE METABOLIC PANEL
ALT: UNDETERMINED U/L (ref 17–63)
AST: UNDETERMINED U/L (ref 15–41)
Albumin: 4 g/dL (ref 3.5–5.0)
Alkaline Phosphatase: UNDETERMINED U/L (ref 38–126)
Anion gap: UNDETERMINED (ref 5–15)
BUN: UNDETERMINED mg/dL (ref 6–20)
CO2: UNDETERMINED mmol/L (ref 22–32)
Calcium: UNDETERMINED mg/dL (ref 8.9–10.3)
Chloride: UNDETERMINED mmol/L (ref 101–111)
Creatinine, Ser: 0.81 mg/dL (ref 0.61–1.24)
GFR calc Af Amer: >60 mL/min (ref >60)
GFR calc non Af Amer: >60 mL/min (ref >60)
Glucose, Bld: 109 mg/dL — ABNORMAL HIGH (ref 65–99)
Potassium: UNDETERMINED mmol/L (ref 3.5–5.1)
Sodium: UNDETERMINED mmol/L (ref 135–145)
Total Bilirubin: UNDETERMINED mg/dL (ref 0.3–1.2)
Total Protein: 6.6 g/dL (ref 6.5–8.1)

## 2015-06-26 LAB — URINALYSIS, ROUTINE W REFLEX MICROSCOPIC
Bilirubin Urine: NEGATIVE
Glucose, UA: NEGATIVE mg/dL
Hgb urine dipstick: NEGATIVE
Ketones, ur: NEGATIVE mg/dL
Leukocytes, UA: NEGATIVE
Nitrite: NEGATIVE
Protein, ur: NEGATIVE mg/dL
Specific Gravity, Urine: 1.021 (ref 1.005–1.030)
pH: 7.5 (ref 5.0–8.0)

## 2015-06-26 LAB — APTT: aPTT: 31 seconds (ref 24–37)

## 2015-06-26 LAB — PROTIME-INR
INR: 1.12 (ref 0.00–1.49)
Prothrombin Time: 14.6 seconds (ref 11.6–15.2)

## 2015-06-26 LAB — SURGICAL PCR SCREEN
MRSA, PCR: NEGATIVE
Staphylococcus aureus: NEGATIVE

## 2015-06-26 NOTE — Progress Notes (Signed)
Repeat cmet day of surgery. Not adequate sample at preadmit

## 2015-06-26 NOTE — Pre-Procedure Instructions (Addendum)
[name]  06/26/2015      [facility] [address] [name], [place] - [address] AT [facility] [address] & [facility] [address] [place] [address] Phone: [phone] Fax: [phone]  [id number] - [place], [place] - [address] AT [facility] [address] [place] [address] Phone: [phone] Fax: [phone]    Your procedure is scheduled on 07/07/15  Report to [facility] Admitting at 1000 A.M.  Call this number if you have problems the morning of surgery:  [phone]   Remember:  Do not eat food or drink liquids after midnight.  Take these medicines the morning of surgery with A SIP OF WATER inhalers,neb as needed(bring albuterol inhaler),pain med if needed  STOP all herbel meds, nsaids (aleve,naproxen,advil,ibuprofen) 5 days prior to surgery(07/02/15) including aspirin,vitamins   Do not wear jewelry, make-up or nail polish.  Do not wear lotions, powders, or perfumes.  You may wear deodorant.  Do not shave 48 hours prior to surgery.  Men may shave face and neck.  Do not bring valuables to the hospital.  [facility] is not responsible for any belongings or valuables.  Contacts, dentures or bridgework may not be worn into surgery.  Leave your suitcase in the car.  After surgery it may be brought to your room.  For patients admitted to the hospital, discharge time will be determined by your treatment team.  Patients discharged the day of surgery will not be allowed to drive home.   Name and phone number of your driver:    Special instructions:   Special Instructions: [place] - Preparing for Surgery  Before surgery, you can play an important role.  Because skin is not sterile, your skin needs to be as free of germs as possible.  You can reduce the number of germs on you skin by washing with CHG (chlorahexidine gluconate) soap before surgery.  CHG is an antiseptic cleaner which kills  germs and bonds with the skin to continue killing germs even after washing.  Please DO NOT use if you have an allergy to CHG or antibacterial soaps.  If your skin becomes reddened/irritated stop using the CHG and inform your nurse when you arrive at Short Stay.  Do not shave (including legs and underarms) for at least 48 hours prior to the first CHG shower.  You may shave your face.  Please follow these instructions carefully:   1.  Shower with CHG Soap the night before surgery and the morning of Surgery.  2.  If you choose to wash your hair, wash your hair first as usual with your normal shampoo.  3.  After you shampoo, rinse your hair and body thoroughly to remove the Shampoo.  4.  Use CHG as you would any other liquid soap.  You can apply chg directly  to the skin and wash gently with scrungie or a clean washcloth.  5.  Apply the CHG Soap to your body ONLY FROM THE NECK DOWN.  Do not use on open wounds or open sores.  Avoid contact with your eyes ears, mouth and genitals (private parts).  Wash genitals (private parts)       with your normal soap.  6.  Wash thoroughly, paying special attention to the area where your surgery will be performed.  7.  Thoroughly rinse your body with warm water from the neck down.  8.  DO NOT shower/wash  with your normal soap after using and rinsing off the CHG Soap.  9.  Pat yourself dry with a clean towel.            10.  Wear clean pajamas.            11.  Place clean sheets on your bed the night of your first shower and do not sleep with pets.  Day of Surgery  Do not apply any lotions/deodorants the morning of surgery.  Please wear clean clothes to the hospital/surgery center.  Please read over the following fact sheets that you were given. Pain Booklet, Coughing and Deep Breathing, Total Joint Packet, MRSA Information and Surgical Site Infection Prevention

## 2015-06-29 NOTE — Progress Notes (Signed)
Anesthesia Chart Review:  Pt is a 60 year old male scheduled for L total knee arthroplasty on 07/07/2015 with Dr. Wandra Feinstein. Murphy.   PCP is Dr. Fleet ContrasEdwin Avbuere who has cleared pt for surgery.   PMH includes:  HTN, asthma, COPD, seizures (several years ago, related to alcohol abuse), GERD. Current smoker. BMI 24  Medications include: albuterol, lisinopril, viagra  Preoperative labs reviewed.  CMET vial quantity insufficient and will need to be obtained DOS.   Chest x-ray 06/26/15 reviewed. No active cardiopulmonary disease.   EKG 08/22/14: NSR. Septal infarct old. EKG stable dating back to 2013.   If no changes, I anticipate pt can proceed with surgery as scheduled.   Rica Mastngela Delenn Ahn, FNP-BC [facility] Phone: [phone] 06/29/2015 4:54 PM

## 2015-07-06 MED ORDER — CEFAZOLIN SODIUM-DEXTROSE 2-4 GM/100ML-% IV SOLN
2.0000 g | INTRAVENOUS | Status: AC
  Administered 2015-07-07: 2 g via INTRAVENOUS
  Filled 2015-07-06: qty 100

## 2015-07-06 MED ORDER — ACETAMINOPHEN 500 MG PO TABS
1000.0000 mg | ORAL_TABLET | Freq: Once | ORAL | Status: AC
  Administered 2015-07-07: 1000 mg via ORAL
  Filled 2015-07-06: qty 2

## 2015-07-06 MED ORDER — DEXTROSE-NACL 5-0.45 % IV SOLN: 100.0000 mL/h | INTRAVENOUS | Status: DC

## 2015-07-06 MED ORDER — TRANEXAMIC ACID 1000 MG/10ML IV SOLN
1000.0000 mg | INTRAVENOUS | Status: AC
  Administered 2015-07-07: 1000 mg via INTRAVENOUS
  Filled 2015-07-06: qty 10

## 2015-07-06 NOTE — Progress Notes (Signed)
Spoke to Phe[place] Run[place] at office they will contact patient's nurses aid and notify them of time change.

## 2015-07-06 NOTE — Progress Notes (Signed)
Procedure time moved to 0948.  Unable to reach patient to inform of the time change.  Patient will now need to arrive at 0745.

## 2015-07-07 ENCOUNTER — Inpatient Hospital Stay (HOSPITAL_COMMUNITY): Payer: Medicare Other

## 2015-07-07 ENCOUNTER — Inpatient Hospital Stay (HOSPITAL_COMMUNITY)
Admission: RE | Admit: 2015-07-07 | Discharge: 2015-07-09 | DRG: 470 | Disposition: A | Discharge status: Home Health | Payer: Medicare Other | Source: Ambulatory Visit | Attending: Orthopedic Surgery | Admitting: Orthopedic Surgery

## 2015-07-07 ENCOUNTER — Encounter (HOSPITAL_COMMUNITY)
Admission: RE | Disposition: A | Discharge status: Home Health | Payer: Self-pay | Source: Ambulatory Visit | Attending: Orthopedic Surgery

## 2015-07-07 ENCOUNTER — Inpatient Hospital Stay (HOSPITAL_COMMUNITY): Payer: Medicare Other | Admitting: Vascular Surgery

## 2015-07-07 ENCOUNTER — Inpatient Hospital Stay (HOSPITAL_COMMUNITY): Payer: Medicare Other | Admitting: Anesthesiology

## 2015-07-07 ENCOUNTER — Encounter (HOSPITAL_COMMUNITY): Payer: Self-pay | Admitting: *Deleted

## 2015-07-07 DIAGNOSIS — F17210 Nicotine dependence, cigarettes, uncomplicated: Secondary | ICD-10-CM | POA: Diagnosis present

## 2015-07-07 DIAGNOSIS — F1010 Alcohol abuse, uncomplicated: Secondary | ICD-10-CM | POA: Diagnosis present

## 2015-07-07 DIAGNOSIS — G8929 Other chronic pain: Secondary | ICD-10-CM | POA: Diagnosis present

## 2015-07-07 DIAGNOSIS — J449 Chronic obstructive pulmonary disease, unspecified: Secondary | ICD-10-CM | POA: Diagnosis present

## 2015-07-07 DIAGNOSIS — M1712 Unilateral primary osteoarthritis, left knee: Secondary | ICD-10-CM | POA: Diagnosis present

## 2015-07-07 DIAGNOSIS — Z8249 Family history of ischemic heart disease and other diseases of the circulatory system: Secondary | ICD-10-CM | POA: Diagnosis not present

## 2015-07-07 DIAGNOSIS — Z9981 Dependence on supplemental oxygen: Secondary | ICD-10-CM | POA: Diagnosis not present

## 2015-07-07 DIAGNOSIS — I10 Essential (primary) hypertension: Secondary | ICD-10-CM | POA: Diagnosis present

## 2015-07-07 HISTORY — DX: Dependence on supplemental oxygen: Z99.81

## 2015-07-07 HISTORY — PX: TOTAL KNEE ARTHROPLASTY: SHX125

## 2015-07-07 HISTORY — DX: Chronic obstructive pulmonary disease, unspecified: J44.9

## 2015-07-07 HISTORY — DX: Reserved for inherently not codable concepts without codable children: IMO0001

## 2015-07-07 LAB — COMPREHENSIVE METABOLIC PANEL
ALT: 20 U/L (ref 17–63)
AST: 23 U/L (ref 15–41)
Albumin: 3.6 g/dL (ref 3.5–5.0)
Alkaline Phosphatase: 89 U/L (ref 38–126)
Anion gap: 6 (ref 5–15)
BUN: 13 mg/dL (ref 6–20)
CO2: 24 mmol/L (ref 22–32)
Calcium: 9.4 mg/dL (ref 8.9–10.3)
Chloride: 107 mmol/L (ref 101–111)
Creatinine, Ser: 0.8 mg/dL (ref 0.61–1.24)
GFR calc Af Amer: >60 mL/min (ref >60)
GFR calc non Af Amer: >60 mL/min (ref >60)
Glucose, Bld: 115 mg/dL — ABNORMAL HIGH (ref 65–99)
Potassium: 3.7 mmol/L (ref 3.5–5.1)
Sodium: 137 mmol/L (ref 135–145)
Total Bilirubin: 0.4 mg/dL (ref 0.3–1.2)
Total Protein: 6.3 g/dL — ABNORMAL LOW (ref 6.5–8.1)

## 2015-07-07 SURGERY — ARTHROPLASTY, KNEE, TOTAL
Anesthesia: Spinal | Site: Knee | Laterality: Left

## 2015-07-07 MED ORDER — ACETAMINOPHEN 650 MG RE SUPP: 650.0000 mg | Freq: Four times a day (QID) | RECTAL | Status: DC | PRN

## 2015-07-07 MED ORDER — MIDAZOLAM HCL 2 MG/2ML IJ SOLN
INTRAMUSCULAR | Status: AC
  Filled 2015-07-07: qty 2

## 2015-07-07 MED ORDER — LACTATED RINGERS IV SOLN
INTRAVENOUS | Status: DC | PRN
  Administered 2015-07-07 (×2): via INTRAVENOUS

## 2015-07-07 MED ORDER — MENTHOL 3 MG MT LOZG
1.0000 | LOZENGE | OROMUCOSAL | Status: DC | PRN
  Administered 2015-07-09: 3 mg via ORAL
  Filled 2015-07-07: qty 9

## 2015-07-07 MED ORDER — FENTANYL CITRATE (PF) 100 MCG/2ML IJ SOLN
INTRAMUSCULAR | Status: AC
  Filled 2015-07-07: qty 2

## 2015-07-07 MED ORDER — KETOROLAC TROMETHAMINE 30 MG/ML IJ SOLN
INTRAMUSCULAR | Status: DC | PRN
  Administered 2015-07-07: 30 mg via INTRA_ARTICULAR

## 2015-07-07 MED ORDER — LISINOPRIL 20 MG PO TABS
20.0000 mg | ORAL_TABLET | Freq: Every day | ORAL | Status: DC
  Administered 2015-07-08 – 2015-07-09 (×2): 20 mg via ORAL
  Filled 2015-07-07 (×2): qty 1

## 2015-07-07 MED ORDER — METOCLOPRAMIDE HCL 5 MG/ML IJ SOLN: 5.0000 mg | Freq: Three times a day (TID) | INTRAMUSCULAR | Status: DC | PRN

## 2015-07-07 MED ORDER — HYDROMORPHONE HCL 1 MG/ML IJ SOLN
0.2500 mg | INTRAMUSCULAR | Status: DC | PRN
  Administered 2015-07-07 (×2): 0.5 mg via INTRAVENOUS

## 2015-07-07 MED ORDER — CELECOXIB 200 MG PO CAPS
200.0000 mg | ORAL_CAPSULE | Freq: Two times a day (BID) | ORAL | Status: DC
  Administered 2015-07-07 – 2015-07-09 (×4): 200 mg via ORAL
  Filled 2015-07-07 (×4): qty 1

## 2015-07-07 MED ORDER — SODIUM CHLORIDE 0.9 % IR SOLN
Status: DC | PRN
  Administered 2015-07-07 (×2): 1000 mL

## 2015-07-07 MED ORDER — 0.9 % SODIUM CHLORIDE (POUR BTL) OPTIME
TOPICAL | Status: DC | PRN
  Administered 2015-07-07: 1000 mL

## 2015-07-07 MED ORDER — DEXAMETHASONE SODIUM PHOSPHATE 10 MG/ML IJ SOLN
10.0000 mg | Freq: Once | INTRAMUSCULAR | Status: AC
  Administered 2015-07-08: 10 mg via INTRAVENOUS
  Filled 2015-07-07: qty 1

## 2015-07-07 MED ORDER — METHOCARBAMOL 500 MG PO TABS
500.0000 mg | ORAL_TABLET | Freq: Four times a day (QID) | ORAL | Status: DC | PRN
  Administered 2015-07-07 – 2015-07-09 (×5): 500 mg via ORAL
  Filled 2015-07-07 (×5): qty 1

## 2015-07-07 MED ORDER — ALBUTEROL SULFATE HFA 108 (90 BASE) MCG/ACT IN AERS
INHALATION_SPRAY | RESPIRATORY_TRACT | Status: DC | PRN
  Administered 2015-07-07: 4 via RESPIRATORY_TRACT

## 2015-07-07 MED ORDER — ROCURONIUM BROMIDE 100 MG/10ML IV SOLN
INTRAVENOUS | Status: DC | PRN
  Administered 2015-07-07: 10 mg via INTRAVENOUS
  Administered 2015-07-07: 30 mg via INTRAVENOUS

## 2015-07-07 MED ORDER — DOCUSATE SODIUM 100 MG PO CAPS
100.0000 mg | ORAL_CAPSULE | Freq: Two times a day (BID) | ORAL | Status: DC
  Administered 2015-07-07 – 2015-07-09 (×4): 100 mg via ORAL
  Filled 2015-07-07 (×4): qty 1

## 2015-07-07 MED ORDER — OXYCODONE HCL 5 MG PO TABS
5.0000 mg | ORAL_TABLET | ORAL | Status: DC | PRN
  Administered 2015-07-07 – 2015-07-09 (×8): 10 mg via ORAL
  Filled 2015-07-07 (×8): qty 2

## 2015-07-07 MED ORDER — GLYCOPYRROLATE 0.2 MG/ML IJ SOLN
INTRAMUSCULAR | Status: DC | PRN
  Administered 2015-07-07 (×2): 0.2 mg via INTRAVENOUS

## 2015-07-07 MED ORDER — BUPIVACAINE HCL (PF) 0.25 % IJ SOLN
INTRAMUSCULAR | Status: DC | PRN
  Administered 2015-07-07: 30 mL

## 2015-07-07 MED ORDER — ONDANSETRON HCL 4 MG/2ML IJ SOLN: 4.0000 mg | Freq: Four times a day (QID) | INTRAMUSCULAR | Status: DC | PRN

## 2015-07-07 MED ORDER — EPHEDRINE SULFATE 50 MG/ML IJ SOLN
INTRAMUSCULAR | Status: DC | PRN
  Administered 2015-07-07: 20 mg via INTRAVENOUS

## 2015-07-07 MED ORDER — ONDANSETRON HCL 4 MG PO TABS: 4.0000 mg | ORAL_TABLET | Freq: Four times a day (QID) | ORAL | Status: DC | PRN

## 2015-07-07 MED ORDER — PHENOL 1.4 % MT LIQD: 1.0000 | OROMUCOSAL | Status: DC | PRN

## 2015-07-07 MED ORDER — BUPIVACAINE HCL (PF) 0.25 % IJ SOLN
INTRAMUSCULAR | Status: AC
  Filled 2015-07-07: qty 30

## 2015-07-07 MED ORDER — OXYCODONE HCL 5 MG PO TABS: 10.0000 mg | ORAL_TABLET | Freq: Once | ORAL | Status: DC | PRN

## 2015-07-07 MED ORDER — METOCLOPRAMIDE HCL 5 MG PO TABS: 5.0000 mg | ORAL_TABLET | Freq: Three times a day (TID) | ORAL | Status: DC | PRN

## 2015-07-07 MED ORDER — METHOCARBAMOL 1000 MG/10ML IJ SOLN
500.0000 mg | Freq: Four times a day (QID) | INTRAVENOUS | Status: DC | PRN
  Filled 2015-07-07: qty 5

## 2015-07-07 MED ORDER — DEXTROSE-NACL 5-0.45 % IV SOLN
INTRAVENOUS | Status: DC
  Administered 2015-07-07: 18:00:00 via INTRAVENOUS

## 2015-07-07 MED ORDER — PROPOFOL 10 MG/ML IV BOLUS
INTRAVENOUS | Status: AC
  Filled 2015-07-07: qty 20

## 2015-07-07 MED ORDER — OXYCODONE HCL 5 MG/5ML PO SOLN: 5.0000 mg | Freq: Once | ORAL | Status: DC | PRN

## 2015-07-07 MED ORDER — FENTANYL CITRATE (PF) 250 MCG/5ML IJ SOLN
INTRAMUSCULAR | Status: AC
  Filled 2015-07-07: qty 5

## 2015-07-07 MED ORDER — PROPOFOL 10 MG/ML IV BOLUS
INTRAVENOUS | Status: DC | PRN
  Administered 2015-07-07: 150 mg via INTRAVENOUS
  Administered 2015-07-07: 50 mg via INTRAVENOUS

## 2015-07-07 MED ORDER — PHENYLEPHRINE 40 MCG/ML (10ML) SYRINGE FOR IV PUSH (FOR BLOOD PRESSURE SUPPORT)
PREFILLED_SYRINGE | INTRAVENOUS | Status: AC
  Filled 2015-07-07: qty 10

## 2015-07-07 MED ORDER — MIDAZOLAM HCL 5 MG/5ML IJ SOLN
INTRAMUSCULAR | Status: DC | PRN
  Administered 2015-07-07: 2 mg via INTRAVENOUS

## 2015-07-07 MED ORDER — MORPHINE SULFATE (PF) 2 MG/ML IV SOLN
2.0000 mg | INTRAVENOUS | Status: DC | PRN
  Administered 2015-07-08 (×2): 2 mg via INTRAVENOUS
  Filled 2015-07-07 (×2): qty 1

## 2015-07-07 MED ORDER — ACETAMINOPHEN 325 MG PO TABS: 650.0000 mg | ORAL_TABLET | Freq: Four times a day (QID) | ORAL | Status: DC | PRN

## 2015-07-07 MED ORDER — ALBUTEROL SULFATE (2.5 MG/3ML) 0.083% IN NEBU
INHALATION_SOLUTION | RESPIRATORY_TRACT | Status: AC
  Filled 2015-07-07: qty 3

## 2015-07-07 MED ORDER — CHLORHEXIDINE GLUCONATE 4 % EX LIQD: 60.0000 mL | Freq: Once | CUTANEOUS | Status: DC

## 2015-07-07 MED ORDER — ASPIRIN EC 325 MG PO TBEC
325.0000 mg | DELAYED_RELEASE_TABLET | Freq: Every day | ORAL | Status: DC
  Administered 2015-07-08 – 2015-07-09 (×2): 325 mg via ORAL
  Filled 2015-07-07 (×2): qty 1

## 2015-07-07 MED ORDER — LACTATED RINGERS IV SOLN
INTRAVENOUS | Status: DC
  Administered 2015-07-07: 09:00:00 via INTRAVENOUS

## 2015-07-07 MED ORDER — CEFAZOLIN SODIUM-DEXTROSE 2-4 GM/100ML-% IV SOLN
2.0000 g | Freq: Four times a day (QID) | INTRAVENOUS | Status: AC
  Administered 2015-07-07 – 2015-07-08 (×2): 2 g via INTRAVENOUS
  Filled 2015-07-07 (×3): qty 100

## 2015-07-07 MED ORDER — ALBUTEROL SULFATE (2.5 MG/3ML) 0.083% IN NEBU
2.5000 mg | INHALATION_SOLUTION | RESPIRATORY_TRACT | Status: DC | PRN
  Administered 2015-07-07: 2.5 mg via RESPIRATORY_TRACT

## 2015-07-07 MED ORDER — FENTANYL CITRATE (PF) 100 MCG/2ML IJ SOLN
50.0000 ug | Freq: Once | INTRAMUSCULAR | Status: AC
  Administered 2015-07-07: 50 ug via INTRAVENOUS

## 2015-07-07 MED ORDER — FENTANYL CITRATE (PF) 100 MCG/2ML IJ SOLN
INTRAMUSCULAR | Status: DC | PRN
  Administered 2015-07-07 (×2): 50 ug via INTRAVENOUS
  Administered 2015-07-07: 100 ug via INTRAVENOUS
  Administered 2015-07-07 (×2): 50 ug via INTRAVENOUS
  Administered 2015-07-07 (×2): 100 ug via INTRAVENOUS

## 2015-07-07 MED ORDER — LABETALOL HCL 5 MG/ML IV SOLN
INTRAVENOUS | Status: DC | PRN
  Administered 2015-07-07: 10 mg via INTRAVENOUS

## 2015-07-07 MED ORDER — METOCLOPRAMIDE HCL 5 MG/ML IJ SOLN: 10.0000 mg | Freq: Once | INTRAMUSCULAR | Status: DC | PRN

## 2015-07-07 MED ORDER — HYDROMORPHONE HCL 1 MG/ML IJ SOLN
INTRAMUSCULAR | Status: AC
  Filled 2015-07-07: qty 1

## 2015-07-07 MED ORDER — SUCCINYLCHOLINE CHLORIDE 20 MG/ML IJ SOLN
INTRAMUSCULAR | Status: DC | PRN
  Administered 2015-07-07: 70 mg via INTRAVENOUS

## 2015-07-07 MED ORDER — NEOSTIGMINE METHYLSULFATE 10 MG/10ML IV SOLN
INTRAVENOUS | Status: DC | PRN
  Administered 2015-07-07: 2 mg via INTRAVENOUS

## 2015-07-07 MED ORDER — ONDANSETRON HCL 4 MG/2ML IJ SOLN
INTRAMUSCULAR | Status: DC | PRN
  Administered 2015-07-07: 4 mg via INTRAVENOUS

## 2015-07-07 SURGICAL SUPPLY — 59 items
BANDAGE ELASTIC 6 VELCRO ST LF (GAUZE/BANDAGES/DRESSINGS) ×4 IMPLANT
BANDAGE ESMARK 6X9 LF (GAUZE/BANDAGES/DRESSINGS) ×1 IMPLANT
BLADE SAG 18X100X1.27 (BLADE) ×2 IMPLANT
BNDG COHESIVE 6X5 TAN STRL LF (GAUZE/BANDAGES/DRESSINGS) ×2 IMPLANT
BNDG ESMARK 6X9 LF (GAUZE/BANDAGES/DRESSINGS) ×2
BOWL SMART MIX CTS (DISPOSABLE) ×2 IMPLANT
CAPT KNEE TOTAL 3 ×2 IMPLANT
CEMENT BONE SIMPLEX SPEEDSET (Cement) ×4 IMPLANT
CLSR STERI-STRIP ANTIMIC 1/2X4 (GAUZE/BANDAGES/DRESSINGS) ×4 IMPLANT
COVER SURGICAL LIGHT HANDLE (MISCELLANEOUS) ×2 IMPLANT
CUFF TOURNIQUET SINGLE 34IN LL (TOURNIQUET CUFF) ×2 IMPLANT
DRAPE EXTREMITY T 121X128X90 (DRAPE) ×2 IMPLANT
DRAPE U-SHAPE 47X51 STRL (DRAPES) ×2 IMPLANT
DRSG ADAPTIC 3X8 NADH LF (GAUZE/BANDAGES/DRESSINGS) ×2 IMPLANT
DRSG MEPILEX BORDER 4X8 (GAUZE/BANDAGES/DRESSINGS) ×2 IMPLANT
DRSG PAD ABDOMINAL 8X10 ST (GAUZE/BANDAGES/DRESSINGS) ×2 IMPLANT
DURAPREP 26ML APPLICATOR (WOUND CARE) ×4 IMPLANT
ELECT CAUTERY BLADE 6.4 (BLADE) ×2 IMPLANT
ELECT REM PT RETURN 9FT ADLT (ELECTROSURGICAL) ×2
ELECTRODE REM PT RTRN 9FT ADLT (ELECTROSURGICAL) ×1 IMPLANT
FACESHIELD WRAPAROUND (MASK) ×4 IMPLANT
GAUZE SPONGE 4X4 12PLY STRL (GAUZE/BANDAGES/DRESSINGS) ×2 IMPLANT
GLOVE BIO SURGEON STRL SZ7 (GLOVE) ×2 IMPLANT
GLOVE BIO SURGEON STRL SZ7.5 (GLOVE) ×2 IMPLANT
GLOVE BIOGEL PI IND STRL 7.0 (GLOVE) ×1 IMPLANT
GLOVE BIOGEL PI IND STRL 8 (GLOVE) ×1 IMPLANT
GLOVE BIOGEL PI INDICATOR 7.0 (GLOVE) ×1
GLOVE BIOGEL PI INDICATOR 8 (GLOVE) ×1
GOWN STRL REUS W/ TWL LRG LVL3 (GOWN DISPOSABLE) ×2 IMPLANT
GOWN STRL REUS W/ TWL XL LVL3 (GOWN DISPOSABLE) IMPLANT
GOWN STRL REUS W/TWL LRG LVL3 (GOWN DISPOSABLE) ×2
GOWN STRL REUS W/TWL XL LVL3 (GOWN DISPOSABLE)
HANDPIECE INTERPULSE COAX TIP (DISPOSABLE)
IMMOBILIZER KNEE 22 UNIV (SOFTGOODS) ×2 IMPLANT
IMMOBILIZER KNEE 24 THIGH 36 (MISCELLANEOUS) IMPLANT
IMMOBILIZER KNEE 24 UNIV (MISCELLANEOUS)
KIT BASIN OR (CUSTOM PROCEDURE TRAY) ×2 IMPLANT
KIT ROOM TURNOVER OR (KITS) ×2 IMPLANT
LIQUID BAND (GAUZE/BANDAGES/DRESSINGS) IMPLANT
MANIFOLD NEPTUNE II (INSTRUMENTS) ×2 IMPLANT
NS IRRIG 1000ML POUR BTL (IV SOLUTION) ×2 IMPLANT
PACK TOTAL JOINT (CUSTOM PROCEDURE TRAY) ×2 IMPLANT
PACK UNIVERSAL I (CUSTOM PROCEDURE TRAY) ×2 IMPLANT
PAD ARMBOARD 7.5X6 YLW CONV (MISCELLANEOUS) ×2 IMPLANT
PADDING CAST COTTON 6X4 STRL (CAST SUPPLIES) ×4 IMPLANT
SET HNDPC FAN SPRY TIP SCT (DISPOSABLE) IMPLANT
STAPLER VISISTAT 35W (STAPLE) IMPLANT
SUCTION FRAZIER HANDLE 10FR (MISCELLANEOUS) ×1
SUCTION TUBE FRAZIER 10FR DISP (MISCELLANEOUS) ×1 IMPLANT
SUT MNCRL AB 4-0 PS2 18 (SUTURE) ×2 IMPLANT
SUT MON AB 2-0 CT1 27 (SUTURE) ×4 IMPLANT
SUT MON AB 2-0 CT1 36 (SUTURE) ×4 IMPLANT
SUT VIC AB 0 CT1 27 (SUTURE) ×1
SUT VIC AB 0 CT1 27XBRD ANBCTR (SUTURE) ×1 IMPLANT
SUT VIC AB 1 CTX 36 (SUTURE) ×1
SUT VIC AB 1 CTX36XBRD ANBCTR (SUTURE) ×1 IMPLANT
TOWEL OR 17X24 6PK STRL BLUE (TOWEL DISPOSABLE) ×2 IMPLANT
TOWEL OR 17X26 10 PK STRL BLUE (TOWEL DISPOSABLE) ×2 IMPLANT
TRAY FOLEY BAG SILVER LF 14FR (CATHETERS) IMPLANT

## 2015-07-07 NOTE — Op Note (Signed)
DATE OF SURGERY:  07/07/2015 TIME: 1:48 PM  PATIENT NAME:  Henry DearWillie J Stroud   AGE: 60 y.o.    PRE-OPERATIVE DIAGNOSIS:  OA LEFT KNEE  POST-OPERATIVE DIAGNOSIS:  Same  PROCEDURE:  Procedure(s): LEFT TOTAL KNEE ARTHROPLASTY   SURGEON:  MURPHY, TIMOTHY D, MD   ASSISTANT:  Aquilla HackerHenry Martensen, PA-C, She was present and scrubbed throughout the case, critical for completion in a timely fashion, and for retraction, instrumentation, and closure.    OPERATIVE IMPLANTS: Stryker Triathlon Posterior Stabilized.  Femur size 5, Tibia size 5, Patella size 40 3-peg oval button, with a 9 mm polyethylene insert.   PREOPERATIVE INDICATIONS:  [name] is a 60 y.o. year old male with end stage bone on bone degenerative arthritis of the knee who failed conservative treatment, including injections, antiinflammatories, activity modification, and assistive devices, and had significant impairment of their activities of daily living, and elected for Total Knee Arthroplasty.   The risks, benefits, and alternatives were discussed at length including but not limited to the risks of infection, bleeding, nerve injury, stiffness, blood clots, the need for revision surgery, cardiopulmonary complications, among others, and they were willing to proceed.   OPERATIVE DESCRIPTION:  The patient was brought to the operative room and placed in a supine position.  General anesthesia was administered.  IV antibiotics were given.  The lower extremity was prepped and draped in the usual sterile fashion.  Time out was performed.  The leg was elevated and exsanguinated and the tourniquet was inflated.  Anterior approach was performed.  The patella was everted and osteophytes were removed.  The anterior horn of the medial and lateral meniscus was removed.   The distal femur was opened with the drill and the intramedullary distal femoral cutting jig was utilized, set at 5 degrees resecting 10 mm off the distal femur.  Care  was taken to protect the collateral ligaments.  The distal femoral sizing jig was applied, taking care to avoid notching.  Then the 4-in-1 cutting jig was applied and the anterior and posterior femur was cut, along with the chamfer cuts.  All posterior osteophytes were removed.  The flexion gap was then measured and was symmetric with the extension gap.  Then the extramedullary tibial cutting jig was utilized making the appropriate cut using the anterior tibial crest as a reference building in appropriate posterior slope.  Care was taken during the cut to protect the medial and collateral ligaments.  The proximal tibia was removed along with the posterior horns of the menisci.  The PCL was sacrificed.    The extensor gap was measured and was approximately 9mm.    I completed the distal femoral preparation using the appropriate jig to prepare the box.  The patella was then measured, and cut with the saw.    The proximal tibia sized and prepared accordingly with the reamer and the punch, and then all components were trialed with the 9mm poly insert.  The knee was found to have excellent balance and full motion.    The above named components were then cemented into place and all excess cement was removed. Poly tibial piece and patella were inserted.  I was very happy with his stability and ROM  I performed a periarticular injection with marcaine and toradol  The knee was easily taken through a range of motion and the patella tracked well and the knee irrigated copiously and the parapatellar and subcutaneous tissue closed with vicryl, and monocryl with steri strips for the  skin.  The incision was dressed with sterile gauze and the tourniquet released and the patient was awakened and returned to the PACU in stable and satisfactory condition.  There were no complications.  Total tourniquet time was 100 minutes.   POSTOPERATIVE PLAN: post op Abx, DVT px: SCD's, TED's, Early ambulation and ASA  325  This note was generated using a template and dragon dictation system. In light of that, I have reviewed the note and all aspects of it are applicable to this case. Any dictation errors are due to the computerized dictation system.

## 2015-07-07 NOTE — Anesthesia Procedure Notes (Signed)
Procedure Name: Intubation Date/Time: 07/07/2015 11:46 AM Performed by: Gavin PoundLOWDER, Arryn Terrones J Pre-anesthesia Checklist: Patient identified, Emergency Drugs available, Suction available, Patient being monitored and Timeout performed Patient Re-evaluated:Patient Re-evaluated prior to inductionOxygen Delivery Method: Circle system utilized Preoxygenation: Pre-oxygenation with 100% oxygen Intubation Type: IV induction Ventilation: Mask ventilation without difficulty Laryngoscope Size: Mac and 3 Grade View: Grade II Tube type: Oral Tube size: 7.5 mm Number of attempts: 1 Placement Confirmation: ETT inserted through vocal cords under direct vision,  positive ETCO2 and breath sounds checked- equal and bilateral Secured at: 21 cm Tube secured with: Tape Dental Injury: Teeth and Oropharynx as per pre-operative assessment

## 2015-07-07 NOTE — Anesthesia Postprocedure Evaluation (Signed)
Anesthesia Post Note  [name]  Procedure(s) Performed: Procedure(s) (LRB): LEFT TOTAL KNEE ARTHROPLASTY (Left)  Patient location during evaluation: PACU Anesthesia Type: General and Regional Level of consciousness: awake and alert Pain management: pain level controlled Vital Signs Assessment: post-procedure vital signs reviewed and stable Respiratory status: spontaneous breathing, nonlabored ventilation, respiratory function stable and patient connected to nasal cannula oxygen Cardiovascular status: blood pressure returned to baseline and stable Postop Assessment: no signs of nausea or vomiting Anesthetic complications: no    Last Vitals:  Filed Vitals:   07/07/15 1510 07/07/15 1525  BP: 160/90 155/90  Pulse: 61 44  Temp:    Resp: 13 20    Last Pain:  Filed Vitals:   07/07/15 1530  PainSc: 5                  Reino KentJudd, Nikcole Eischeid J

## 2015-07-07 NOTE — Transfer of Care (Signed)
Immediate Anesthesia Transfer of Care Note  [name]  Procedure(s) Performed: Procedure(s): LEFT TOTAL KNEE ARTHROPLASTY (Left)  Patient Location: PACU  Anesthesia Type:General  Level of Consciousness: awake  Airway & Oxygen Therapy: Patient Spontanous Breathing and Patient connected to nasal cannula oxygen  Post-op Assessment: Report given to RN and Post -op Vital signs reviewed and stable  Post vital signs: Reviewed and stable  Last Vitals:  Filed Vitals:   07/07/15 1002 07/07/15 1441  BP: 160/86 157/98  Pulse: 58 67  Temp:  36.5 C  Resp: 30 20    Last Pain:  Filed Vitals:   07/07/15 1445  PainSc: Asleep      Patients Stated Pain Goal: 4 (07/07/15 [id number])  Complications: No apparent anesthesia complications

## 2015-07-07 NOTE — Interval H&P Note (Signed)
History and Physical Interval Note:  07/07/2015 7:01 AM  [name]  has presented today for surgery, with the diagnosis of OA LEFT KNEE  The various methods of treatment have been discussed with the patient and family. After consideration of risks, benefits and other options for treatment, the patient has consented to  Procedure(s): LEFT TOTAL KNEE ARTHROPLASTY (Left) as a surgical intervention .  The patient's history has been reviewed, patient examined, no change in status, stable for surgery.  I have reviewed the patient's chart and labs.  Questions were answered to the patient's satisfaction.     [name]

## 2015-07-07 NOTE — Progress Notes (Signed)
Orthopedic Tech Progress Note Patient Details:  [name] 08/31/1955 [id number]  CPM Left Knee CPM Left Knee: On Left Knee Flexion (Degrees): 90 Left Knee Extension (Degrees): 0 Additional Comments: on at 1537  Ortho Devices Ortho Device/Splint Location: applied ohf, and footsie roll Ortho Device/Splint Interventions: Ordered, Application   Jennye MoccasinHughes, Gavyn Zoss Craig 07/07/2015, 3:47 PM

## 2015-07-07 NOTE — Progress Notes (Signed)
Utilization review completed.  

## 2015-07-07 NOTE — Anesthesia Preprocedure Evaluation (Signed)
Anesthesia Evaluation  Patient identified by MRN, date of birth, ID band Patient awake    Reviewed: Allergy & Precautions, H&P , NPO status , Patient's Chart, lab work & pertinent test results  History of Anesthesia Complications Negative for: history of anesthetic complications  Airway Mallampati: II  TM Distance: >3 FB Neck ROM: full    Dental no notable dental hx.    Pulmonary asthma , COPD, Current Smoker,    Pulmonary exam normal breath sounds clear to auscultation       Cardiovascular hypertension, negative cardio ROS Normal cardiovascular exam Rhythm:regular Rate:Normal     Neuro/Psych Seizures -, Well Controlled,  From alcohol abuse    GI/Hepatic negative GI ROS, (+)     substance abuse  alcohol use, Substance abuse in past   Endo/Other  negative endocrine ROS  Renal/GU negative Renal ROS     Musculoskeletal  (+) Arthritis ,   Abdominal   Peds  Hematology negative hematology ROS (+)   Anesthesia Other Findings   Reproductive/Obstetrics negative OB ROS                             Anesthesia Physical Anesthesia Plan  ASA: II  Anesthesia Plan: Spinal   Post-op Pain Management:    Induction: Intravenous  Airway Management Planned: Nasal Cannula  Additional Equipment:   Intra-op Plan:   Post-operative Plan:   Informed Consent: I have reviewed the patients History and Physical, chart, labs and discussed the procedure including the risks, benefits and alternatives for the proposed anesthesia with the patient or authorized representative who has indicated his/her understanding and acceptance.   Dental Advisory Given  Plan Discussed with: Anesthesiologist, CRNA and Surgeon  Anesthesia Plan Comments:         Anesthesia Quick Evaluation

## 2015-07-08 ENCOUNTER — Encounter (HOSPITAL_COMMUNITY): Payer: Self-pay | Admitting: Orthopedic Surgery

## 2015-07-08 MED ORDER — ALBUTEROL SULFATE (2.5 MG/3ML) 0.083% IN NEBU
2.5000 mg | INHALATION_SOLUTION | Freq: Four times a day (QID) | RESPIRATORY_TRACT | Status: DC
  Administered 2015-07-08 – 2015-07-09 (×5): 2.5 mg via RESPIRATORY_TRACT
  Filled 2015-07-08 (×5): qty 3

## 2015-07-08 MED ORDER — OXYCODONE-ACETAMINOPHEN 5-325 MG PO TABS: 1.0000 | ORAL_TABLET | ORAL | Status: DC | PRN

## 2015-07-08 MED ORDER — ASPIRIN EC 325 MG PO TBEC: 325.0000 mg | DELAYED_RELEASE_TABLET | Freq: Every day | ORAL | Status: DC

## 2015-07-08 MED ORDER — METHOCARBAMOL 500 MG PO TABS: 500.0000 mg | ORAL_TABLET | Freq: Four times a day (QID) | ORAL | Status: DC | PRN

## 2015-07-08 MED ORDER — ONDANSETRON HCL 4 MG PO TABS: 4.0000 mg | ORAL_TABLET | Freq: Three times a day (TID) | ORAL | Status: DC | PRN

## 2015-07-08 NOTE — Discharge Instructions (Signed)

## 2015-07-08 NOTE — Progress Notes (Signed)
Physical Therapy Treatment Patient Details Name: Henry DearWillie J [name] MRN: [id number] DOB: 01/06/1956 Today's Date: 07/08/2015    History of Present Illness Pt presents for left TKA. PMH: COPD (on O2 at night), HTN, OA    PT Comments    Pt ambulated 2180' with RW and min-guard A. This morning relayed that he could go home with daughter for awhile but this afternoon reports that this is not a good situation and really want to go to his home. Discussed that he is really not safe to be home alone for the first 1-3 days and needs someone there with him. He is working to see if nurse can stay with him. PT will continue to follow.   Follow Up Recommendations  Home health PT;Supervision - Intermittent (recommend SNF if he cannot find someone to stay with him)     Equipment Recommendations  3in1 (PT);Rolling walker with 5" wheels    Recommendations for Other Services       Precautions / Restrictions Precautions Precautions: Knee Precaution Booklet Issued: No Precaution Comments: reviewed proper positioning as pt was lying in bed without anything propping left heel Restrictions Weight Bearing Restrictions: Yes LLE Weight Bearing: Weight bearing as tolerated    Mobility  Bed Mobility Overal bed mobility: Needs Assistance Bed Mobility: Supine to Sit;Sit to Supine     Supine to sit: Modified independent (Device/Increase time) Sit to supine: Min assist   General bed mobility comments: min A to LLE for return to bed  Transfers Overall transfer level: Needs assistance Equipment used: Rolling walker (2 wheeled) Transfers: Sit to/from Stand Sit to Stand: Min guard         General transfer comment: min-guard for safety and vc's for getting left heel to floor  Ambulation/Gait Ambulation/Gait assistance: Min guard Ambulation Distance (Feet): 80 Feet Assistive device: Rolling walker (2 wheeled) Gait Pattern/deviations: Step-through pattern;Decreased weight shift to left;Decreased  dorsiflexion - left;Decreased stance time - left Gait velocity: decreased Gait velocity interpretation: Below normal speed for age/gender General Gait Details: pt continues to tend to keep left heel off ground though can correct with vc's   Stairs            Wheelchair Mobility    Modified Rankin (Stroke Patients Only)       Balance Overall balance assessment: Needs assistance Sitting-balance support: No upper extremity supported Sitting balance-Leahy Scale: Normal     Standing balance support: Bilateral upper extremity supported Standing balance-Leahy Scale: Poor                      Cognition Arousal/Alertness: Awake/alert Behavior During Therapy: WFL for tasks assessed/performed Overall Cognitive Status: Within Functional Limits for tasks assessed                      Exercises Total Joint Exercises Quad Sets: AROM;Left;10 reps;Supine Straight Leg Raises: AAROM;Left;10 reps;Supine Long Arc Quad: AROM;Left;Seated;10 reps Goniometric ROM: 20-90    General Comments General comments (skin integrity, edema, etc.): left in CPM 0-90      Pertinent Vitals/Pain Pain Assessment: Faces Faces Pain Scale: Hurts little more Pain Location: left knee Pain Descriptors / Indicators: Sore Pain Intervention(s): Limited activity within patient's tolerance;Monitored during session;Premedicated before session;Repositioned    Home Living                      Prior Function            PT Goals (current goals can  now be found in the care plan section) Acute Rehab PT Goals Patient Stated Goal: go to his home PT Goal Formulation: With patient Time For Goal Achievement: 07/15/15 Potential to Achieve Goals: Good Progress towards PT goals: Progressing toward goals    Frequency  7X/week    PT Plan Equipment recommendations need to be updated    Co-evaluation             End of Session Equipment Utilized During Treatment: Gait  belt Activity Tolerance: Patient tolerated treatment well Patient left: with call bell/phone within reach;in CPM;in bed     Time: 1610-9604 PT Time Calculation (min) (ACUTE ONLY): 28 min  Charges:  $Gait Training: 23-37 mins                    G Codes:     Lyanne Co, PT  Acute Rehab Services  [phone]  Lyanne Co 07/08/2015, 4:19 PM

## 2015-07-08 NOTE — Progress Notes (Signed)
   Assessment/Plan: 1 Day Post-Op   POD# 1 S/P Left TKA by Dr. Jewel Baizeimothy D. Murphy on 07/07/15  Principal Problem:   Primary osteoarthritis of left knee Active Problems:   Chronic obstructive airway disease with asthma (HCC)   Essential hypertension  AFVSN.  Urinating.  COPD on home O2 at night with faint diffuse wheeze without SOB. - Will continue nebs scheduled.  Doing well.  Pain mild / controlled.  Urinating.  Not OOB yet.  Eating, drinking - D/C IVFs.  PLAN: Advance diet Up with therapy D/C IV fluids Discharge home with home health depending on PT / OT Evaluation today. Physical Therapy as ordered   Weight Bearing: Weight Bearing as Tolerated (WBAT)  Dressings: Change only if soiled. VTE prophylaxis: Aspirin and TED hose Dispo: Home  W/ HHN Home Needs: Dan HumphreysWalker  - He utilized same before surgery.  Subjective: Patient reports pain as mild 3/10.   Urinating.  Not OOB yet.  Eating, drinking  Objective:   Imaging: Prosthesis with good size / position.  ~1 mm lucency medial tibial plateau in the adjacent tray of the tibial component.  VITALS:   Filed Vitals:   07/07/15 1629 07/07/15 2013 07/08/15 0028 07/08/15 0545  BP: 152/90 146/91 137/99 144/77  Pulse: 75 45 62 66  Temp: 97.7 F (36.5 C) 97.9 F (36.6 C) 98.3 F (36.8 C) 97.9 F (36.6 C)  TempSrc: Oral Oral Oral Oral  Resp: 20 21 19 19   Height:      Weight:      SpO2: 100% 99% 100% 97%    Lab Results  Component Value Date   WBC 10.1 06/26/2015   HGB 14.1 06/26/2015   HCT 41.8 06/26/2015   MCV 100.5* 06/26/2015   PLT 206 06/26/2015   BMET    Component Value Date/Time   NA 137 07/07/2015 0854   K 3.7 07/07/2015 0854   CL 107 07/07/2015 0854   CO2 24 07/07/2015 0854   GLUCOSE 115* 07/07/2015 0854   BUN 13 07/07/2015 0854   CREATININE 0.80 07/07/2015 0854   CALCIUM 9.4 07/07/2015 0854   GFRNONAA >60 07/07/2015 0854   GFRAA >60 07/07/2015 0854    Physical Exam General: NAD  Neurologically  intact ABD soft Resp: No increased WOB.  Diffuse faint expiratory wheeze.   CV: RRR MSK: Neurovascular intact Sensation intact distally Intact pulses distally Dorsiflexion/Plantar flexion weak / intact Incision: dressing C/D/I Compartment soft   Lucretia KernHenry Calvin Martensen III 07/08/2015, 7:25 AM
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
## 2015-07-09 NOTE — Progress Notes (Signed)
Physical Therapy Treatment Patient Details Name: [name] MRN: [id number] DOB: 02/16/1955 Today's Date: 07/09/2015    History of Present Illness Pt presents for left TKA. PMH: COPD (on O2 at night), HTN, OA    PT Comments    Pt performed increased gait, remains unable to heel strike on L, or actively dorsiflex LLE.  Pt to d/c home without family support.  This is concerning as pt lost his balance this am x4 requiring min assist to correct.  Pt presents with poor safety awareness.  L buckling noted.  Educated patient to wear knee immobilizer at all times unless with therapy.  Will f/u this pm for BID treatment before return home.    Follow Up Recommendations  Home health PT;Supervision - Intermittent     Equipment Recommendations  3in1 (PT);Rolling walker with 5" wheels    Recommendations for Other Services       Precautions / Restrictions Precautions Precautions: Knee;Fall Precaution Booklet Issued: No Precaution Comments: Reviewed not placing ice pack, pillow, or other object under knee Required Braces or Orthoses: Knee Immobilizer - Left Knee Immobilizer - Left: Other (comment) (in room but no order, pt able to perform SLRs but extensor lag noted, mild buckling with gait.  ) Restrictions Weight Bearing Restrictions: Yes LLE Weight Bearing: Weight bearing as tolerated    Mobility  Bed Mobility Overal bed mobility: Needs Assistance Bed Mobility: Supine to Sit     Supine to sit: Supervision     General bed mobility comments: Pt received in recliner on arrival.    Transfers Overall transfer level: Needs assistance Equipment used: Rolling walker (2 wheeled) Transfers: Sit to/from Stand Sit to Stand: Min guard         General transfer comment: Cues for hand placement and L knee extension in stance phase.  Pt flexed on L with poor heel contact.    Ambulation/Gait Ambulation/Gait assistance: Min assist;Min guard Ambulation Distance (Feet): 180  Feet Assistive device: Rolling walker (2 wheeled) Gait Pattern/deviations: Step-through pattern;Decreased stride length;Decreased weight shift to left Gait velocity: decreased   General Gait Details: Pt presents with decreased L heel strike and knee extension in stance phase.  Cues for L heel strike and knee extension.  pt required cues to push RW forward vs. picking up and putting down.     Stairs            Wheelchair Mobility    Modified Rankin (Stroke Patients Only)       Balance Overall balance assessment: Needs assistance Sitting-balance support: No upper extremity supported;Feet supported Sitting balance-Leahy Scale: Good     Standing balance support: Bilateral upper extremity supported;During functional activity Standing balance-Leahy Scale: Poor Standing balance comment: Heavy reliance on RW for UE support to maintain balance                    Cognition Arousal/Alertness: Awake/alert Behavior During Therapy: WFL for tasks assessed/performed Overall Cognitive Status: Within Functional Limits for tasks assessed       Memory: Decreased recall of precautions              Exercises Total Joint Exercises Ankle Circles/Pumps: AROM;Both;10 reps;Limitations Ankle Circles/Pumps Limitations: no active motion noted left ankle Quad Sets: AROM;Left;10 reps;Supine Gluteal Sets: AROM;10 reps;Supine Heel Slides: AROM;Left;10 reps;Supine Hip ABduction/ADduction: AAROM;Left;10 reps;Supine Straight Leg Raises: AROM;Left;10 reps;Supine;Limitations Straight Leg Raises Limitations: extensor lag noted, appears tightness is the cause for lag.   Goniometric ROM: 19-77    General Comments General comments (  skin integrity, edema, etc.): Pt's SpO2 94% on RA at rest and during activity. Pt with audible wheezing, but reports this is normal for him. Encouraged pt to use incentive spirometer 10x/hour.      Pertinent Vitals/Pain Pain Assessment: Faces Pain Score: 2   Faces Pain Scale: Hurts whole lot Pain Location: L knee Pain Descriptors / Indicators: Discomfort;Sore;Tightness Pain Intervention(s): Monitored during session;Repositioned;Ice applied    Home Living Family/patient expects to be discharged to:: Private residence Living Arrangements: Alone Available Help at Discharge: Personal care attendant;Available PRN/intermittently Type of Home: Apartment Home Access: Level entry   Home Layout: One level Home Equipment: Walker - 2 wheels;Walker - 4 wheels Additional Comments: Unsure of accuracy of pt's home and family availability information as pt reports conflicting information. Pt lives in an apartment and has an aide that comes 5 days/week for several hours. Pt says that he can go stay with his daughter, but they do not get along well and he would prefer to go to his own home. Pt reports that his daughter's house is 2 levels with only full bathroom upstairs - pt reported to PT that daughter's house is 1 level.     Prior Function Level of Independence: Needs assistance  Gait / Transfers Assistance Needed: Uses rollator which is missing R brake ADL's / Homemaking Assistance Needed: PCA assists with cooking, cleaning, laundry, bill and medication management, and grocery shopping.     PT Goals (current goals can now be found in the care plan section) Acute Rehab PT Goals Patient Stated Goal: to go to his home today Potential to Achieve Goals: Good Progress towards PT goals: Progressing toward goals    Frequency  7X/week    PT Plan Current plan remains appropriate    Co-evaluation             End of Session Equipment Utilized During Treatment: Gait belt Activity Tolerance: Patient tolerated treatment well Patient left: with call bell/phone within reach;in CPM;in bed     Time: 4098-1191 PT Time Calculation (min) (ACUTE ONLY): 25 min  Charges:  $Gait Training: 8-22 mins $Therapeutic Exercise: 8-22 mins                    G  Codes:      Florestine Avers 07/13/15, 9:43 AM  Joycelyn Rua, PTA pager [phone]

## 2015-07-09 NOTE — Progress Notes (Signed)
Occupational Therapy Evaluation Patient Details Name: [name] MRN: [id number] DOB: 1955-08-18 Today's Date: 07/09/2015    History of Present Illness Pt presents for left TKA. PMH: COPD (on O2 at night), HTN, OA   Clinical Impression   PTA, pt was independent with ADLs except for occasional assistance for bathing and IADLS from PCA and used rollator that has 1 missing brake. Educated pt on knee precautions, CPM/0 degree bone foam use, proper use of DME, compensatory strategies for ADLs, use of elevation/ice for pain management, fall prevention, and home safety strategies. Pt stated that he does not currently have a working phone , but does have an emergency alert system and has neighbors that check on him regularly in addition to his PCA 5x/wk. Discussed emergency situation plan with pt. Pt will benefit from continued acute OT to increase independence and safety with ADLs and mobility to allow for safe discharge home. Recommend HHOT upon d/c and 3in1 and RW for home use.    Follow Up Recommendations  Home health OT;Supervision/Assistance - 24 hour    Equipment Recommendations  3 in 1 bedside comode;Other (comment) (RW-2 wheeled)    Recommendations for Other Services       Precautions / Restrictions Precautions Precautions: Knee;Fall Precaution Booklet Issued: No Precaution Comments: Reviewed not placing ice pack, pillow, or other object under knee Required Braces or Orthoses: Knee Immobilizer - Left Knee Immobilizer - Left: Other (comment) (in room, but no order for one) Restrictions Weight Bearing Restrictions: Yes LLE Weight Bearing: Weight bearing as tolerated      Mobility Bed Mobility Overal bed mobility: Needs Assistance Bed Mobility: Supine to Sit     Supine to sit: Supervision     General bed mobility comments: Supervision for safety. HOB flat, no use of bedrails.   Transfers Overall transfer level: Needs assistance Equipment used: Rolling walker (2  wheeled) Transfers: Sit to/from Stand Sit to Stand: Min guard         General transfer comment: Min guard assist for safety. Pt with very antalgic gait and L knee buckling. Educated pt on use of knee immobilizer but question pt's ability to use this at home.    Balance Overall balance assessment: Needs assistance Sitting-balance support: No upper extremity supported;Feet supported Sitting balance-Leahy Scale: Good     Standing balance support: Bilateral upper extremity supported;During functional activity Standing balance-Leahy Scale: Poor Standing balance comment: Heavy reliance on RW for UE support to maintain balance                            ADL Overall ADL's : Needs assistance/impaired     Grooming: Wash/dry hands;Min guard;Standing   Upper Body Bathing: Set up;Sitting   Lower Body Bathing: Minimal assistance;Sit to/from stand   Upper Body Dressing : Set up;Sitting   Lower Body Dressing: Minimal assistance;Sit to/from stand;Cueing for compensatory techniques Lower Body Dressing Details (indicate cue type and reason): cues to dress LLE first and undress it last, assist to start clothing over feet Toilet Transfer: Min guard;Cueing for safety;Ambulation;BSC;RW Toilet Transfer Details (indicate cue type and reason): BSC over toilet, cues to feel BSC on back of legs before reaching back to sit Toileting- Clothing Manipulation and Hygiene: Min guard;Sit to/from stand     Tub/Shower Transfer Details (indicate cue type and reason): Discussed and demonstrated use of 3in1 as shower seat. Pt reported that he will sponge bathe for awhile until knee has healed Functional mobility during ADLs:  Min guard;Rolling walker General ADL Comments: Educated pt on use of CPM/0 degree bone foam use, knee precautions, compensatory strategies for ADLs, energy conservation, fall prevention/home safety strategies, use of ice/elevation for pain management. Pt also reported that he does  not currently have a working phone, but does have an emergency alert system - discussed how pt would react in an emergency situation and informed CM/SW of pt's situation.     Vision Vision Assessment?: Vision impaired- to be further tested in functional context Additional Comments: Pt overshooting for objects when reaching for them - pt would benefit from optometrist consultation.   Perception     Praxis      Pertinent Vitals/Pain Pain Assessment: 0-10 Pain Score: 2  Pain Location: L knee Pain Descriptors / Indicators: Sore Pain Intervention(s): Limited activity within patient's tolerance;Premedicated before session;Monitored during session;Repositioned     Hand Dominance Right   Extremity/Trunk Assessment Upper Extremity Assessment Upper Extremity Assessment: Overall WFL for tasks assessed   Lower Extremity Assessment Lower Extremity Assessment: LLE deficits/detail LLE Deficits / Details: decreased ROM and strength as expected post op. Pt has limited ROM and strength in ankle and has difficulty putting heel on ground when ambulating. LLE Coordination: decreased gross motor   Cervical / Trunk Assessment Cervical / Trunk Assessment: Normal   Communication Communication Communication: No difficulties   Cognition Arousal/Alertness: Awake/alert Behavior During Therapy: WFL for tasks assessed/performed Overall Cognitive Status: Within Functional Limits for tasks assessed       Memory: Decreased recall of precautions             General Comments       Exercises       Shoulder Instructions      Home Living Family/patient expects to be discharged to:: Private residence Living Arrangements: Alone Available Help at Discharge: Personal care attendant;Available PRN/intermittently Type of Home: Apartment Home Access: Level entry     Home Layout: One level     Bathroom Shower/Tub: Tub/shower unit;Curtain Shower/tub characteristics: Engineer, building servicesCurtain Bathroom Toilet:  Standard     Home Equipment: Environmental consultantWalker - 2 wheels;Walker - 4 wheels   Additional Comments: Unsure of accuracy of pt's home and family availability information as pt reports conflicting information. Pt lives in an apartment and has an aide that comes 5 days/week for several hours. Pt says that he can go stay with his daughter, but they do not get along well and he would prefer to go to his own home. Pt reports that his daughter's house is 2 levels with only full bathroom upstairs - pt reported to PT that daughter's house is 1 level.       Prior Functioning/Environment Level of Independence: Needs assistance  Gait / Transfers Assistance Needed: Uses rollator which is missing R brake ADL's / Homemaking Assistance Needed: PCA assists with cooking, cleaning, laundry, bill and medication management, and grocery shopping.        OT Diagnosis: Acute pain;Cognitive deficits   OT Problem List: Decreased strength;Decreased range of motion;Decreased activity tolerance;Impaired balance (sitting and/or standing);Decreased safety awareness;Decreased knowledge of use of DME or AE;Decreased knowledge of precautions;Decreased cognition;Pain   OT Treatment/Interventions: Self-care/ADL training;Therapeutic exercise;Energy conservation;DME and/or AE instruction;Therapeutic activities;Patient/family education;Balance training    OT Goals(Current goals can be found in the care plan section) Acute Rehab OT Goals Patient Stated Goal: to go to his home today OT Goal Formulation: With patient Time For Goal Achievement: 07/23/15 Potential to Achieve Goals: Good ADL Goals Pt Will Perform Lower Body Bathing: with supervision;sit to/from stand  Pt Will Perform Lower Body Dressing: with supervision;sit to/from stand Pt Will Transfer to Toilet: with modified independence;ambulating;bedside commode (over toilet) Pt Will Perform Toileting - Clothing Manipulation and hygiene: with supervision;sitting/lateral leans;sit  to/from stand Additional ADL Goal #1: Pt will verbalize knee precaution and how to use 0 degree bone foam without verbal cues.  OT Frequency: Min 2X/week   Barriers to D/C: Decreased caregiver support  Lives alone and has aide who comes 5 days/week. Unsure if aide is able to come more often or for longer periods of time. Also unsure of pt's 2 daughters availability to assist - pt initially planned to stay with 1 of them, but has since decided against that due to dificult family dynamics.       Co-evaluation              End of Session Equipment Utilized During Treatment: Gait belt;Rolling walker;Left knee immobilizer CPM Left Knee CPM Left Knee: Off Left Knee Flexion (Degrees): 90 Left Knee Extension (Degrees): 0 Nurse Communication: Mobility status  Activity Tolerance: Patient tolerated treatment well Patient left: in chair;with call bell/phone within reach   Time: 0812-0837 OT Time Calculation (min): 25 min Charges:  OT General Charges $OT Visit: 1 Procedure OT Evaluation $OT Eval Moderate Complexity: 1 Procedure OT Treatments $Self Care/Home Management : 8-22 mins G-Codes:    Nils Pyle, OTR/L Pager: [phone] 07/09/2015, 9:02 AM

## 2015-07-09 NOTE — Progress Notes (Signed)
Orthopedic Tech Progress Note Patient Details:  [name] 11/08/1955 [id number]  Patient ID: [name], male   DOB: 09/14/1955, 60 y.o.   MRN: [id number] Applied cpm 0-90  [name], [name] 07/09/2015, 6:19 AM

## 2015-07-09 NOTE — Care Management Important Message (Signed)
Important Message  Patient Details  Name: [name] MRN: [id number] Date of Birth: 10/27/1955   Medicare Important Message Given:  Yes    Bernadette HoitShoffner, Aqib Lough Coleman 07/09/2015, 12:23 PM

## 2015-07-09 NOTE — Care Management Note (Addendum)
Case Management Note  Patient Details  Name: RUFFIN LADA MRN: [id number] Date of Birth: 11-18-1955  Subjective/Objective:             S/p left total knee arthroplasty       Action/Plan: Set up with [facility] for HHPT by MD office. Spoke with patient on 07/08/15, gave choice for [facility], he chose to work with [place]. Today OT recommending HHOT, contacted [facility] with [facility] and set up HHOT. [employer] delivered rolling walker and 3N1 to patient's room and will deliver CPM to patient after discharge. On 07/08/15 patient stated that he was going to stay with his daughter Drenda Freeze but today is stating that he is going to go straight to his apartment. He has an aide 8-11am daily but then would be alone and he has lost his phone and can not replace it until 07/18/15 due to finances .He states that he has pull alarms in every room and a neighbor checks on him every afternoon. Patient gave me permission to contact his aide Liborio Nixon [phone] and his daughter Drenda Freeze. Contacted Liborio Nixon and explained that the phone number I have for Drenda Freeze is not a working #. She gave me Fran's number [phone]. Spoke with Drenda Freeze and explained that today her father is saying that he is going home rather than to her home. Informed her that he will have therapy visits from [facility] but needs to have supervision in the afternoons and overnight especially being without a phone. She stated that she will speak with her father and try to get him to agree to go to her home. Her address is [address], [place] [address]. Informed her he is being discharged today. She stated that she will call me after they speak. Contacted Asuzena Weis with [facility] and gave her Drenda Freeze and Janice's phone numbers and Fran's address. Will update her once final discharge plan is in place.  Informed patient that I spoke with Drenda Freeze and Liborio Nixon and they think he should go home with Drenda Freeze. Discussed his concerns and safety concerns, he stated that he would go home with Drenda Freeze. Will  continue to follow until discharge.   Patient and I both spoke with Drenda Freeze by phone and she stated that she would pick up patient and take him to her sister's home and if her sister Sheryle Hail, who does not have a phone, is not able to care for him, Drenda Freeze will take him home with her.Drenda Freeze agreed to call Gen[facility], [employer] and patient's aide Liborio Nixon and let them know where patient will be staying. I contacted Corrie Dandy with Meriel Pica with [employer] and patient's aide Liborio Nixon and updated them on discharge plan, they all have Fran's phone number.          Expected Discharge Date:                  Expected Discharge Plan:  Home w Home Health Services  In-House Referral:  NA  Discharge planning Services  CM Consult  Post Acute Care Choice:  Durable Medical Equipment, Home Health Choice offered to:  Patient  DME Arranged:  CPM, Walker rolling, 3-N-1 DME Agency:  [employer]  HH Arranged:  PT, OT HH Agency:  [facility]  Status of Service:  In process, will continue to follow  Medicare Important Message Given:    Date Medicare IM Given:    Medicare IM give by:    Date Additional Medicare IM Given:    Additional Medicare Important Message give by:  If discussed at Long Length of Stay Meetings, dates discussed:    Additional Comments:  Monica BectonKrieg, Charne Mcbrien Watson, RN 07/09/2015, 11:06 AM

## 2015-07-09 NOTE — Progress Notes (Signed)
Physical Therapy Treatment Patient Details Name: [name] MRN: [id number] DOB: 05/10/1955 Today's Date: 07/09/2015    History of Present Illness Pt presents for left TKA. PMH: COPD (on O2 at night), HTN, OA    PT Comments    Pt denies stair to enter apartment or daughters house [name](Fran).  Pt reports he is still trying to decide where he is going to stay, Pt currently does not have a phone.  Pt remains to experience buckling in L knee during stance phase.  Pt required cues for sequencing and L heel strike to improve technique and decrease buckling.  Pt educated to rest in bone foam and use knee immobilizer when up walking or standing.    Follow Up Recommendations  Home health PT;Supervision - Intermittent     Equipment Recommendations  3in1 (PT);Rolling walker with 5" wheels    Recommendations for Other Services       Precautions / Restrictions Precautions Precautions: Knee;Fall Precaution Booklet Issued: No Precaution Comments: Reviewed not placing ice pack, pillow, or other object under knee Required Braces or Orthoses: Knee Immobilizer - Left Knee Immobilizer - Left: Other (comment) Restrictions Weight Bearing Restrictions: Yes LLE Weight Bearing: Weight bearing as tolerated    Mobility  Bed Mobility Overal bed mobility: Needs Assistance Bed Mobility: Supine to Sit     Supine to sit: Supervision     General bed mobility comments: Pt performed without assistance.    Transfers Overall transfer level: Needs assistance Equipment used: Rolling walker (2 wheeled) Transfers: Sit to/from Stand Sit to Stand: Min guard         General transfer comment: Cues for hand placement and L knee extension in stance phase.  Pt flexed on L with poor heel contact.  Buckling noted in L stance phase as pt fatigue which increases risk for falling.   Ambulation/Gait Ambulation/Gait assistance: Min assist;Min guard Ambulation Distance (Feet): 300 Feet Assistive device: Rolling  walker (2 wheeled) Gait Pattern/deviations: Step-through pattern;Antalgic;Trunk flexed;Decreased dorsiflexion - left;Decreased step length - right;Decreased stance time - left;Narrow base of support Gait velocity: decreased   General Gait Details: Pt presents with decreased L heel strike and knee extension in stance phase.  Cues for L heel strike and knee extension.  pt required cues to push RW forward vs. picking up and putting down.     Stairs            Wheelchair Mobility    Modified Rankin (Stroke Patients Only)       Balance     Sitting balance-Leahy Scale: Good       Standing balance-Leahy Scale: Poor                      Cognition Arousal/Alertness: Awake/alert Behavior During Therapy: WFL for tasks assessed/performed Overall Cognitive Status: Within Functional Limits for tasks assessed       Memory: Decreased recall of precautions              Exercises Total Joint Exercises Ankle Circles/Pumps: AROM;Both;10 reps;Limitations;PROM Ankle Circles/Pumps Limitations: no active motion noted left ankle Quad Sets: AROM;Left;10 reps;Supine Gluteal Sets: AROM;10 reps;Supine Short Arc Quad: AROM;Left;10 reps;Supine Heel Slides: AAROM;Left;10 reps;Supine Hip ABduction/ADduction: AAROM;Left;10 reps;Supine Straight Leg Raises: AROM;Left;10 reps;Supine;Limitations Straight Leg Raises Limitations: extensor lag noted, appears tightness is the cause for lag.   Long Arc Quad: AROM;Left;Seated;10 reps Knee Flexion: AROM;AAROM;Left;5 reps (1x5 AROM and 1x5 AAROM x10 sec hold in end range.  )    General Comments  Pertinent Vitals/Pain Pain Assessment: 0-10 Faces Pain Scale: Hurts even more Pain Location: L knee Pain Descriptors / Indicators: Discomfort;Sore;Tightness Pain Intervention(s): Monitored during session;Repositioned    Home Living                      Prior Function            PT Goals (current goals can now be found in  the care plan section) Acute Rehab PT Goals Patient Stated Goal: to go to his home today Potential to Achieve Goals: Good Progress towards PT goals: Progressing toward goals    Frequency  7X/week    PT Plan Current plan remains appropriate    Co-evaluation             End of Session Equipment Utilized During Treatment: Gait belt Activity Tolerance: Patient tolerated treatment well Patient left: with call bell/phone within reach;in CPM;in bed     Time: 4540-9811 PT Time Calculation (min) (ACUTE ONLY): 30 min  Charges:  $Gait Training: 8-22 mins $Therapeutic Exercise: 8-22 mins                    G Codes:      Florestine Avers 2015/07/24, 2:12 PM  Joycelyn Rua, PTA pager [phone]

## 2015-07-09 NOTE — Progress Notes (Signed)
Discharge instructions RX's and follow up appt explained/provided to patient and daughter  Drenda FreezeFran verbalized understanding. Patient going home with daughters Home health set up daughter instructed to call and give address where patient will need to be seen verbalized understanding, rolling walker and 3in1 sent home with patient. Patient left floor via wheelchair accompanied by staff no c/o pain or shortness of breath at d/c.  [name] Nash-Finch CompanyLynn

## 2015-07-09 NOTE — Progress Notes (Signed)
Orthopedic Tech Progress Note Patient Details:  Neomia DearWillie J Clendenen 01/17/1956 [id number]  Patient ID: Neomia DearWillie J Brandt, male   DOB: 12/13/1955, 60 y.o.   MRN: [id number] Pt getting washed up. Will call when ready for cpm.  Trinna PostMartinez, Koki Buxton J 07/09/2015, 5:26 AM

## 2015-07-18 ENCOUNTER — Emergency Department (HOSPITAL_COMMUNITY): Payer: Medicare Other

## 2015-07-18 ENCOUNTER — Emergency Department (HOSPITAL_COMMUNITY)
Admission: EM | Admit: 2015-07-18 | Discharge: 2015-07-18 | Disposition: A | Discharge status: Home / Self Care | Payer: Medicare Other | Source: Home / Self Care | Attending: Emergency Medicine | Admitting: Emergency Medicine

## 2015-07-18 ENCOUNTER — Encounter (HOSPITAL_COMMUNITY): Payer: Self-pay | Admitting: Emergency Medicine

## 2015-07-18 DIAGNOSIS — G8918 Other acute postprocedural pain: Secondary | ICD-10-CM

## 2015-07-18 DIAGNOSIS — Z7982 Long term (current) use of aspirin: Secondary | ICD-10-CM | POA: Insufficient documentation

## 2015-07-18 DIAGNOSIS — J449 Chronic obstructive pulmonary disease, unspecified: Secondary | ICD-10-CM

## 2015-07-18 DIAGNOSIS — Z96652 Presence of left artificial knee joint: Secondary | ICD-10-CM | POA: Insufficient documentation

## 2015-07-18 DIAGNOSIS — Z79899 Other long term (current) drug therapy: Secondary | ICD-10-CM | POA: Insufficient documentation

## 2015-07-18 DIAGNOSIS — F17210 Nicotine dependence, cigarettes, uncomplicated: Secondary | ICD-10-CM | POA: Insufficient documentation

## 2015-07-18 DIAGNOSIS — M25569 Pain in unspecified knee: Secondary | ICD-10-CM

## 2015-07-18 DIAGNOSIS — T8132XA Disruption of internal operation (surgical) wound, not elsewhere classified, initial encounter: Secondary | ICD-10-CM | POA: Diagnosis not present

## 2015-07-18 DIAGNOSIS — I10 Essential (primary) hypertension: Secondary | ICD-10-CM

## 2015-07-18 LAB — CBC WITH DIFFERENTIAL/PLATELET
Basophils Absolute: 0.1 10*3/uL (ref 0.0–0.1)
Basophils Relative: 1 %
Eosinophils Absolute: 0.5 10*3/uL (ref 0.0–0.7)
Eosinophils Relative: 5 %
HCT: 37.1 % — ABNORMAL LOW (ref 39.0–52.0)
Hemoglobin: 12.3 g/dL — ABNORMAL LOW (ref 13.0–17.0)
Lymphocytes Relative: 14 %
Lymphs Abs: 1.5 10*3/uL (ref 0.7–4.0)
MCH: 31.9 pg (ref 26.0–34.0)
MCHC: 33.2 g/dL (ref 30.0–36.0)
MCV: 96.4 fL (ref 78.0–100.0)
Monocytes Absolute: 0.9 10*3/uL (ref 0.1–1.0)
Monocytes Relative: 9 %
Neutro Abs: 7.5 10*3/uL (ref 1.7–7.7)
Neutrophils Relative %: 71 %
Platelets: 391 10*3/uL (ref 150–400)
RBC: 3.85 MIL/uL — ABNORMAL LOW (ref 4.22–5.81)
RDW: 13.1 % (ref 11.5–15.5)
WBC: 10.4 10*3/uL (ref 4.0–10.5)

## 2015-07-18 LAB — BASIC METABOLIC PANEL
Anion gap: 15 (ref 5–15)
BUN: 11 mg/dL (ref 6–20)
CO2: 21 mmol/L — ABNORMAL LOW (ref 22–32)
Calcium: 9.9 mg/dL (ref 8.9–10.3)
Chloride: 96 mmol/L — ABNORMAL LOW (ref 101–111)
Creatinine, Ser: 0.76 mg/dL (ref 0.61–1.24)
GFR calc Af Amer: >60 mL/min (ref >60)
GFR calc non Af Amer: >60 mL/min (ref >60)
Glucose, Bld: 78 mg/dL (ref 65–99)
Potassium: 3.8 mmol/L (ref 3.5–5.1)
Sodium: 132 mmol/L — ABNORMAL LOW (ref 135–145)

## 2015-07-18 MED ORDER — MORPHINE SULFATE (PF) 4 MG/ML IV SOLN
4.0000 mg | Freq: Once | INTRAVENOUS | Status: AC
Start: 2015-07-18 — End: 2015-07-18
  Administered 2015-07-18: 4 mg via INTRAMUSCULAR
  Filled 2015-07-18: qty 1

## 2015-07-18 MED ORDER — OXYCODONE-ACETAMINOPHEN 5-325 MG PO TABS
2.0000 | ORAL_TABLET | Freq: Once | ORAL | Status: AC
Start: 2015-07-18 — End: 2015-07-18
  Administered 2015-07-18: 2 via ORAL
  Filled 2015-07-18: qty 2

## 2015-07-18 MED ORDER — OXYCODONE-ACETAMINOPHEN 5-325 MG PO TABS: 1.0000 | ORAL_TABLET | ORAL | Status: DC | PRN

## 2015-07-18 MED ORDER — MORPHINE SULFATE (PF) 4 MG/ML IV SOLN: 4.0000 mg | Freq: Once | INTRAVENOUS | Status: DC

## 2015-07-18 NOTE — ED Notes (Signed)
Pt wheeled to waiting room to await ride home. Pt given instructions and verbalizes understanding of importance to follow up with Dr. Eulah PontMurphy Monday morning. Pt in NAD. VSS. A/O x4.

## 2015-07-18 NOTE — ED Notes (Signed)
Pt to ED via [facility].  States he had L total knee replacement 1 week ago.  Reports L knee pain and unable to ambulate.

## 2015-07-18 NOTE — ED Notes (Signed)
Pt to xray

## 2015-07-18 NOTE — ED Notes (Signed)
Knee immobilizer applied; pedal pulses checked and present after application

## 2015-07-18 NOTE — Discharge Instructions (Signed)
It is important for you to follow-up with Dr. Eulah PontMurphy on Monday morning. Please do not eat or drink anything starting Sunday night at midnight for your visit on Monday morning as Dr. Eulah PontMurphy may try to surgically repair your knee. Take your pain medicine as prescribed and as we discussed. Return to ED for any new or worsening symptoms.
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
## 2015-07-19 ENCOUNTER — Inpatient Hospital Stay (HOSPITAL_COMMUNITY)
Admission: EM | Admit: 2015-07-19 | Discharge: 2015-07-23 | DRG: 908 | Disposition: A | Discharge status: Skilled Nursing Facility | Payer: Medicare Other | Attending: Orthopedic Surgery | Admitting: Orthopedic Surgery

## 2015-07-19 ENCOUNTER — Inpatient Hospital Stay (HOSPITAL_COMMUNITY): Payer: Medicare Other | Admitting: Certified Registered Nurse Anesthetist

## 2015-07-19 ENCOUNTER — Encounter (HOSPITAL_COMMUNITY): Payer: Self-pay | Admitting: Emergency Medicine

## 2015-07-19 ENCOUNTER — Inpatient Hospital Stay (HOSPITAL_COMMUNITY): Payer: Medicare Other

## 2015-07-19 ENCOUNTER — Encounter (HOSPITAL_COMMUNITY)
Admission: EM | Disposition: A | Discharge status: Skilled Nursing Facility | Payer: Self-pay | Source: Home / Self Care | Attending: Orthopedic Surgery

## 2015-07-19 DIAGNOSIS — Y793 Surgical instruments, materials and orthopedic devices (including sutures) associated with adverse incidents: Secondary | ICD-10-CM | POA: Diagnosis present

## 2015-07-19 DIAGNOSIS — Z96652 Presence of left artificial knee joint: Secondary | ICD-10-CM | POA: Diagnosis present

## 2015-07-19 DIAGNOSIS — T8131XD Disruption of external operation (surgical) wound, not elsewhere classified, subsequent encounter: Secondary | ICD-10-CM | POA: Diagnosis not present

## 2015-07-19 DIAGNOSIS — K219 Gastro-esophageal reflux disease without esophagitis: Secondary | ICD-10-CM | POA: Diagnosis present

## 2015-07-19 DIAGNOSIS — T8130XA Disruption of wound, unspecified, initial encounter: Secondary | ICD-10-CM | POA: Diagnosis present

## 2015-07-19 DIAGNOSIS — J45909 Unspecified asthma, uncomplicated: Secondary | ICD-10-CM | POA: Diagnosis present

## 2015-07-19 DIAGNOSIS — E559 Vitamin D deficiency, unspecified: Secondary | ICD-10-CM | POA: Diagnosis present

## 2015-07-19 DIAGNOSIS — Z7982 Long term (current) use of aspirin: Secondary | ICD-10-CM

## 2015-07-19 DIAGNOSIS — Z79899 Other long term (current) drug therapy: Secondary | ICD-10-CM | POA: Diagnosis not present

## 2015-07-19 DIAGNOSIS — F17210 Nicotine dependence, cigarettes, uncomplicated: Secondary | ICD-10-CM | POA: Diagnosis present

## 2015-07-19 DIAGNOSIS — Z9981 Dependence on supplemental oxygen: Secondary | ICD-10-CM | POA: Diagnosis not present

## 2015-07-19 DIAGNOSIS — D500 Iron deficiency anemia secondary to blood loss (chronic): Secondary | ICD-10-CM | POA: Diagnosis present

## 2015-07-19 DIAGNOSIS — B9689 Other specified bacterial agents as the cause of diseases classified elsewhere: Secondary | ICD-10-CM | POA: Diagnosis present

## 2015-07-19 DIAGNOSIS — D62 Acute posthemorrhagic anemia: Secondary | ICD-10-CM | POA: Diagnosis not present

## 2015-07-19 DIAGNOSIS — Y792 Prosthetic and other implants, materials and accessory orthopedic devices associated with adverse incidents: Secondary | ICD-10-CM | POA: Diagnosis not present

## 2015-07-19 DIAGNOSIS — S76112A Strain of left quadriceps muscle, fascia and tendon, initial encounter: Secondary | ICD-10-CM | POA: Diagnosis present

## 2015-07-19 DIAGNOSIS — T8132XA Disruption of internal operation (surgical) wound, not elsewhere classified, initial encounter: Principal | ICD-10-CM | POA: Diagnosis present

## 2015-07-19 DIAGNOSIS — M1990 Unspecified osteoarthritis, unspecified site: Secondary | ICD-10-CM | POA: Diagnosis present

## 2015-07-19 DIAGNOSIS — I10 Essential (primary) hypertension: Secondary | ICD-10-CM | POA: Diagnosis present

## 2015-07-19 DIAGNOSIS — J449 Chronic obstructive pulmonary disease, unspecified: Secondary | ICD-10-CM | POA: Diagnosis present

## 2015-07-19 DIAGNOSIS — S86812A Strain of other muscle(s) and tendon(s) at lower leg level, left leg, initial encounter: Secondary | ICD-10-CM

## 2015-07-19 DIAGNOSIS — Z79891 Long term (current) use of opiate analgesic: Secondary | ICD-10-CM

## 2015-07-19 DIAGNOSIS — T8131XA Disruption of external operation (surgical) wound, not elsewhere classified, initial encounter: Secondary | ICD-10-CM

## 2015-07-19 DIAGNOSIS — S86819A Strain of other muscle(s) and tendon(s) at lower leg level, unspecified leg, initial encounter: Secondary | ICD-10-CM | POA: Diagnosis present

## 2015-07-19 DIAGNOSIS — Z87820 Personal history of traumatic brain injury: Secondary | ICD-10-CM

## 2015-07-19 HISTORY — PX: I&D KNEE WITH POLY EXCHANGE: SHX5024

## 2015-07-19 HISTORY — DX: Strain of other muscle(s) and tendon(s) at lower leg level, left leg, initial encounter: S86.812A

## 2015-07-19 HISTORY — PX: PATELLAR TENDON REPAIR: SHX737

## 2015-07-19 SURGERY — IRRIGATION AND DEBRIDEMENT KNEE WITH POLY EXCHANGE
Anesthesia: General | Site: Knee | Laterality: Left

## 2015-07-19 MED ORDER — ONDANSETRON HCL 4 MG/2ML IJ SOLN: 4.0000 mg | Freq: Four times a day (QID) | INTRAMUSCULAR | Status: DC | PRN

## 2015-07-19 MED ORDER — GLYCOPYRROLATE 0.2 MG/ML IV SOSY
PREFILLED_SYRINGE | INTRAVENOUS | Status: AC
  Filled 2015-07-19: qty 3

## 2015-07-19 MED ORDER — CEFAZOLIN SODIUM-DEXTROSE 2-3 GM-% IV SOLR
INTRAVENOUS | Status: DC | PRN
  Administered 2015-07-19: 2 g via INTRAVENOUS

## 2015-07-19 MED ORDER — MIDAZOLAM HCL 2 MG/2ML IJ SOLN
INTRAMUSCULAR | Status: AC
  Filled 2015-07-19: qty 2

## 2015-07-19 MED ORDER — LACTATED RINGERS IV SOLN
INTRAVENOUS | Status: DC | PRN
  Administered 2015-07-19 (×2): via INTRAVENOUS

## 2015-07-19 MED ORDER — PHENYLEPHRINE 40 MCG/ML (10ML) SYRINGE FOR IV PUSH (FOR BLOOD PRESSURE SUPPORT)
PREFILLED_SYRINGE | INTRAVENOUS | Status: AC
  Filled 2015-07-19: qty 10

## 2015-07-19 MED ORDER — BUPIVACAINE HCL (PF) 0.25 % IJ SOLN
INTRAMUSCULAR | Status: AC
  Filled 2015-07-19: qty 30

## 2015-07-19 MED ORDER — ALBUTEROL SULFATE HFA 108 (90 BASE) MCG/ACT IN AERS
INHALATION_SPRAY | RESPIRATORY_TRACT | Status: AC
  Filled 2015-07-19: qty 6.7

## 2015-07-19 MED ORDER — ONDANSETRON HCL 4 MG/2ML IJ SOLN
INTRAMUSCULAR | Status: DC | PRN
  Administered 2015-07-19: 4 mg via INTRAVENOUS

## 2015-07-19 MED ORDER — FENTANYL CITRATE (PF) 250 MCG/5ML IJ SOLN
INTRAMUSCULAR | Status: AC
  Filled 2015-07-19: qty 5

## 2015-07-19 MED ORDER — CEFAZOLIN SODIUM 1 G IJ SOLR
INTRAMUSCULAR | Status: AC
  Filled 2015-07-19: qty 20

## 2015-07-19 MED ORDER — PHENYLEPHRINE HCL 10 MG/ML IJ SOLN
10.0000 mg | INTRAVENOUS | Status: DC | PRN
  Administered 2015-07-19: 50 ug/min via INTRAVENOUS

## 2015-07-19 MED ORDER — LIDOCAINE 2% (20 MG/ML) 5 ML SYRINGE
INTRAMUSCULAR | Status: AC
  Filled 2015-07-19: qty 5

## 2015-07-19 MED ORDER — OXYCODONE-ACETAMINOPHEN 5-325 MG PO TABS
1.0000 | ORAL_TABLET | Freq: Once | ORAL | Status: AC
  Administered 2015-07-19: 1 via ORAL
  Filled 2015-07-19: qty 1

## 2015-07-19 MED ORDER — FENTANYL CITRATE (PF) 100 MCG/2ML IJ SOLN
INTRAMUSCULAR | Status: DC | PRN
  Administered 2015-07-19: 100 ug via INTRAVENOUS
  Administered 2015-07-19 (×2): 50 ug via INTRAVENOUS
  Administered 2015-07-19: 100 ug via INTRAVENOUS
  Administered 2015-07-19 (×4): 50 ug via INTRAVENOUS

## 2015-07-19 MED ORDER — LABETALOL HCL 5 MG/ML IV SOLN
INTRAVENOUS | Status: AC
  Filled 2015-07-19: qty 4

## 2015-07-19 MED ORDER — METHOCARBAMOL 1000 MG/10ML IJ SOLN: 500.0000 mg | Freq: Four times a day (QID) | INTRAVENOUS | Status: DC | PRN

## 2015-07-19 MED ORDER — SODIUM CHLORIDE 0.9 % IR SOLN
Status: DC | PRN
  Administered 2015-07-19: 9000 mL
  Administered 2015-07-19: 1000 mL

## 2015-07-19 MED ORDER — SUCCINYLCHOLINE CHLORIDE 20 MG/ML IJ SOLN
INTRAMUSCULAR | Status: DC | PRN
  Administered 2015-07-19: 100 mg via INTRAVENOUS

## 2015-07-19 MED ORDER — LIDOCAINE HCL (CARDIAC) 20 MG/ML IV SOLN
INTRAVENOUS | Status: DC | PRN
  Administered 2015-07-19 (×2): 50 mg via INTRAVENOUS

## 2015-07-19 MED ORDER — ROCURONIUM BROMIDE 100 MG/10ML IV SOLN
INTRAVENOUS | Status: DC | PRN
  Administered 2015-07-19: 10 mg via INTRAVENOUS
  Administered 2015-07-19: 25 mg via INTRAVENOUS
  Administered 2015-07-19 (×4): 10 mg via INTRAVENOUS

## 2015-07-19 MED ORDER — ALBUTEROL SULFATE HFA 108 (90 BASE) MCG/ACT IN AERS
INHALATION_SPRAY | RESPIRATORY_TRACT | Status: DC | PRN
  Administered 2015-07-19: 2 via RESPIRATORY_TRACT
  Administered 2015-07-19: 4 via RESPIRATORY_TRACT

## 2015-07-19 MED ORDER — METHOCARBAMOL 500 MG PO TABS
500.0000 mg | ORAL_TABLET | Freq: Four times a day (QID) | ORAL | Status: DC | PRN
  Administered 2015-07-20 – 2015-07-23 (×8): 500 mg via ORAL
  Filled 2015-07-19 (×9): qty 1

## 2015-07-19 MED ORDER — OXYCODONE HCL 5 MG PO TABS: 5.0000 mg | ORAL_TABLET | Freq: Once | ORAL | Status: DC | PRN

## 2015-07-19 MED ORDER — PROPOFOL 10 MG/ML IV BOLUS
INTRAVENOUS | Status: AC
  Filled 2015-07-19: qty 20

## 2015-07-19 MED ORDER — EPHEDRINE 5 MG/ML INJ
INTRAVENOUS | Status: AC
  Filled 2015-07-19: qty 10

## 2015-07-19 MED ORDER — LISINOPRIL 20 MG PO TABS
20.0000 mg | ORAL_TABLET | ORAL | Status: DC
  Administered 2015-07-20 – 2015-07-23 (×4): 20 mg via ORAL
  Filled 2015-07-19 (×4): qty 1

## 2015-07-19 MED ORDER — NICOTINE 14 MG/24HR TD PT24
14.0000 mg | MEDICATED_PATCH | Freq: Once | TRANSDERMAL | Status: AC
  Administered 2015-07-19: 14 mg via TRANSDERMAL
  Filled 2015-07-19: qty 1

## 2015-07-19 MED ORDER — SODIUM CHLORIDE 0.9 % IJ SOLN
INTRAMUSCULAR | Status: AC
  Filled 2015-07-19: qty 10

## 2015-07-19 MED ORDER — POTASSIUM CHLORIDE IN NACL 20-0.45 MEQ/L-% IV SOLN
INTRAVENOUS | Status: DC
  Administered 2015-07-20: 05:00:00 via INTRAVENOUS
  Filled 2015-07-19 (×3): qty 1000

## 2015-07-19 MED ORDER — OXYCODONE HCL 5 MG/5ML PO SOLN: 5.0000 mg | Freq: Once | ORAL | Status: DC | PRN

## 2015-07-19 MED ORDER — PROPOFOL 10 MG/ML IV BOLUS
INTRAVENOUS | Status: DC | PRN
  Administered 2015-07-19: 100 mg via INTRAVENOUS
  Administered 2015-07-19: 40 mg via INTRAVENOUS

## 2015-07-19 MED ORDER — SUGAMMADEX SODIUM 200 MG/2ML IV SOLN
INTRAVENOUS | Status: DC | PRN
  Administered 2015-07-19: 150 mg via INTRAVENOUS

## 2015-07-19 MED ORDER — MIDAZOLAM HCL 5 MG/5ML IJ SOLN
INTRAMUSCULAR | Status: DC | PRN
  Administered 2015-07-19: 1 mg via INTRAVENOUS

## 2015-07-19 MED ORDER — ROCURONIUM BROMIDE 50 MG/5ML IV SOLN
INTRAVENOUS | Status: AC
  Filled 2015-07-19: qty 1

## 2015-07-19 MED ORDER — SUCCINYLCHOLINE CHLORIDE 200 MG/10ML IV SOSY
PREFILLED_SYRINGE | INTRAVENOUS | Status: AC
  Filled 2015-07-19: qty 10

## 2015-07-19 MED ORDER — HYDROMORPHONE HCL 1 MG/ML IJ SOLN: 0.2500 mg | INTRAMUSCULAR | Status: DC | PRN

## 2015-07-19 MED ORDER — EPHEDRINE SULFATE 50 MG/ML IJ SOLN
INTRAMUSCULAR | Status: DC | PRN
  Administered 2015-07-19 (×2): 10 mg via INTRAVENOUS

## 2015-07-19 MED ORDER — PHENYLEPHRINE HCL 10 MG/ML IJ SOLN
INTRAMUSCULAR | Status: DC | PRN
  Administered 2015-07-19 (×2): 80 ug via INTRAVENOUS

## 2015-07-19 MED ORDER — BUPIVACAINE HCL (PF) 0.25 % IJ SOLN
INTRAMUSCULAR | Status: DC | PRN
  Administered 2015-07-19: 20 mL

## 2015-07-19 MED ORDER — SUGAMMADEX SODIUM 200 MG/2ML IV SOLN
INTRAVENOUS | Status: AC
  Filled 2015-07-19: qty 2

## 2015-07-19 SURGICAL SUPPLY — 87 items
BANDAGE ELASTIC 6 VELCRO ST LF (GAUZE/BANDAGES/DRESSINGS) ×6 IMPLANT
BANDAGE ESMARK 6X9 LF (GAUZE/BANDAGES/DRESSINGS) ×2 IMPLANT
BEARIN INSERT TIBIAL SZ5 9 (Orthopedic Implant) ×3 IMPLANT
BEARING INSERT TIBIAL SZ5 9 (Orthopedic Implant) ×2 IMPLANT
BENZOIN TINCTURE PRP APPL 2/3 (GAUZE/BANDAGES/DRESSINGS) IMPLANT
BLADE SAG 18X100X1.27 (BLADE) IMPLANT
BLADE SAW RECIP 87.9 MT (BLADE) IMPLANT
BLADE SAW SGTL 13X75X1.27 (BLADE) IMPLANT
BNDG ELASTIC 6X15 VLCR STRL LF (GAUZE/BANDAGES/DRESSINGS) ×3 IMPLANT
BNDG ESMARK 6X9 LF (GAUZE/BANDAGES/DRESSINGS) ×3
BOOTCOVER CLEANROOM LRG (PROTECTIVE WEAR) IMPLANT
BOWL SMART MIX CTS (DISPOSABLE) IMPLANT
CAST PADDING SYN 3 (CAST SUPPLIES) ×3 IMPLANT
CLSR STERI-STRIP ANTIMIC 1/2X4 (GAUZE/BANDAGES/DRESSINGS) IMPLANT
COVER SURGICAL LIGHT HANDLE (MISCELLANEOUS) ×3 IMPLANT
CUFF TOURNIQUET SINGLE 34IN LL (TOURNIQUET CUFF) ×3 IMPLANT
DECANTER SPIKE VIAL GLASS SM (MISCELLANEOUS) ×3 IMPLANT
DRAPE EXTREMITY T 121X128X90 (DRAPE) IMPLANT
DRAPE PROXIMA HALF (DRAPES) ×3 IMPLANT
DRAPE U-SHAPE 47X51 STRL (DRAPES) ×3 IMPLANT
DURAPREP 26ML APPLICATOR (WOUND CARE) IMPLANT
ELECT CAUTERY BLADE 6.4 (BLADE) IMPLANT
ELECT REM PT RETURN 9FT ADLT (ELECTROSURGICAL) ×3
ELECTRODE REM PT RTRN 9FT ADLT (ELECTROSURGICAL) ×2 IMPLANT
FACESHIELD STD STERILE (MASK) IMPLANT
GAUZE SPONGE 4X4 12PLY STRL (GAUZE/BANDAGES/DRESSINGS) ×3 IMPLANT
GAUZE XEROFORM 5X9 LF (GAUZE/BANDAGES/DRESSINGS) ×3 IMPLANT
GLOVE BIO SURGEON STRL SZ 6.5 (GLOVE) ×6 IMPLANT
GLOVE BIOGEL PI IND STRL 7.5 (GLOVE) ×4 IMPLANT
GLOVE BIOGEL PI IND STRL 8 (GLOVE) ×2 IMPLANT
GLOVE BIOGEL PI INDICATOR 7.5 (GLOVE) ×2
GLOVE BIOGEL PI INDICATOR 8 (GLOVE) ×1
GLOVE BIOGEL PI ORTHO PRO SZ8 (GLOVE) ×1
GLOVE ORTHO TXT STRL SZ7.5 (GLOVE) ×3 IMPLANT
GLOVE PI ORTHO PRO STRL SZ8 (GLOVE) ×2 IMPLANT
GLOVE SURG ORTHO 8.0 STRL STRW (GLOVE) ×3 IMPLANT
GOWN STRL REUS W/ TWL XL LVL3 (GOWN DISPOSABLE) ×2 IMPLANT
GOWN STRL REUS W/TWL 2XL LVL3 (GOWN DISPOSABLE) ×3 IMPLANT
GOWN STRL REUS W/TWL XL LVL3 (GOWN DISPOSABLE) ×1
HANDPIECE INTERPULSE COAX TIP (DISPOSABLE) ×1
HOOD PEEL AWAY FACE SHEILD DIS (HOOD) ×12 IMPLANT
IMMOBILIZER KNEE 22 (SOFTGOODS) ×3 IMPLANT
KIT BASIN OR (CUSTOM PROCEDURE TRAY) ×3 IMPLANT
KIT ROOM TURNOVER OR (KITS) ×3 IMPLANT
MANIFOLD NEPTUNE II (INSTRUMENTS) ×3 IMPLANT
MARKER SKIN DUAL TIP RULER LAB (MISCELLANEOUS) ×3 IMPLANT
NDL SUT 6 .5 CRC .975X.05 MAYO (NEEDLE) ×2 IMPLANT
NEEDLE 18GX1X1/2 (RX/OR ONLY) (NEEDLE) IMPLANT
NEEDLE 22X1 1/2 (OR ONLY) (NEEDLE) ×3 IMPLANT
NEEDLE KEITH (NEEDLE) ×3 IMPLANT
NEEDLE MAYO TAPER (NEEDLE) ×1
NS IRRIG 1000ML POUR BTL (IV SOLUTION) ×3 IMPLANT
PACK TOTAL JOINT (CUSTOM PROCEDURE TRAY) ×3 IMPLANT
PACK UNIVERSAL I (CUSTOM PROCEDURE TRAY) ×3 IMPLANT
PAD ABD 8X10 STRL (GAUZE/BANDAGES/DRESSINGS) ×3 IMPLANT
PAD ARMBOARD 7.5X6 YLW CONV (MISCELLANEOUS) ×6 IMPLANT
PAD CAST 4YDX4 CTTN HI CHSV (CAST SUPPLIES) ×2 IMPLANT
PADDING CAST ABS 6INX4YD NS (CAST SUPPLIES) ×1
PADDING CAST ABS COTTON 6X4 NS (CAST SUPPLIES) ×2 IMPLANT
PADDING CAST COTTON 4X4 STRL (CAST SUPPLIES) ×1
PADDING CAST COTTON 6X4 STRL (CAST SUPPLIES) ×3 IMPLANT
SET HNDPC FAN SPRY TIP SCT (DISPOSABLE) ×2 IMPLANT
SPLINT PLASTER CAST XFAST 5X30 (CAST SUPPLIES) ×2 IMPLANT
SPLINT PLASTER XFAST SET 5X30 (CAST SUPPLIES) ×1
SPONGE GAUZE 4X4 12PLY STER LF (GAUZE/BANDAGES/DRESSINGS) ×3 IMPLANT
SUCTION FRAZIER TIP 10 FR DISP (SUCTIONS) ×3 IMPLANT
SUT ETHILON 3 0 FSL (SUTURE) ×6 IMPLANT
SUT FIBERWIRE #2 38 T-5 BLUE (SUTURE) ×6
SUT MNCRL AB 4-0 PS2 18 (SUTURE) IMPLANT
SUT PDS AB 0 CT 36 (SUTURE) ×6 IMPLANT
SUT VIC AB 0 CT1 27 (SUTURE) ×1
SUT VIC AB 0 CT1 27XBRD ANBCTR (SUTURE) ×2 IMPLANT
SUT VIC AB 2-0 CT1 27 (SUTURE)
SUT VIC AB 2-0 CT1 TAPERPNT 27 (SUTURE) IMPLANT
SUT VIC AB 3-0 FS2 27 (SUTURE) ×3 IMPLANT
SUT VIC AB 3-0 SH 8-18 (SUTURE) IMPLANT
SUTURE FIBERWR #2 38 T-5 BLUE (SUTURE) ×4 IMPLANT
SWAB COLLECTION DEVICE MRSA (MISCELLANEOUS) ×6 IMPLANT
SWAB CULTURE ESWAB REG 1ML (MISCELLANEOUS) ×6 IMPLANT
SYR 30ML LL (SYRINGE) IMPLANT
SYR 50ML LL SCALE MARK (SYRINGE) IMPLANT
SYR CONTROL 10ML LL (SYRINGE) ×3 IMPLANT
SYRINGE 20CC LL (MISCELLANEOUS) ×3 IMPLANT
TOWEL OR 17X24 6PK STRL BLUE (TOWEL DISPOSABLE) ×3 IMPLANT
TOWEL OR 17X26 10 PK STRL BLUE (TOWEL DISPOSABLE) ×3 IMPLANT
TRAY CATH 16FR W/PLASTIC CATH (SET/KITS/TRAYS/PACK) IMPLANT
WATER STERILE IRR 1000ML POUR (IV SOLUTION) IMPLANT

## 2015-07-19 NOTE — Op Note (Signed)
07/19/2015  10:56 PM  PATIENT:  [name]    PRE-OPERATIVE DIAGNOSIS:  Left knee patellar tendon rupture with open wound dehiscence of total knee replacement  POST-OPERATIVE DIAGNOSIS:  Same  PROCEDURE:  Left knee arthroplasty irrigation, debridement, polyethylene exchange, with patellar tendon repair  SURGEON:  [name],Henry Sonntag P, MD  PHYSICIAN ASSISTANT: [name], OPA-C, present and scrubbed throughout the case, critical for completion in a timely fashion, and for retraction, instrumentation, and closure.  ANESTHESIA:   General  PREOPERATIVE INDICATIONS:  [name] is a  60 y.o. male who had a left total knee replacement performed by [name] approximately 2 weeks ago for severe posttraumatic arthropathy, postoperatively [name] reports that he had acute loss of function in his foot and ankle, unable to dorsiflex and plantar flex the foot. He says this was a distinct change compared to before his knee replacement. Approximately 2 days ago he developed significant pain in the knee, with acute inability to bear weight and had an acute inability to ambulate. He denied fevers or chills or drainage from the knee. He was seen in the emergency room, x-rays were taken, I was suspicious of a patellar tendon rupture, he was placed in the immobilizer, a ultrasound of the patellar tendon was not able to be done because of inability of musculoskeletal ultrasound technicians on the weekend, and he was instructed to keep the knee immobilizer on and return to the office on Monday for evaluation with [name]. Unfortunately, getting up off of the toilet today he collapsed, and split his wound open, and came in the emergency room with complete dehiscence and disruption of his retinaculum and skin. He elected for urgent surgical intervention.   The risks benefits and alternatives were discussed with the patient preoperatively including but not limited to the risks of infection,  bleeding, nerve injury, cardiopulmonary complications, the need for revision surgery, among others, and the patient was willing to proceed. We also discussed the potential need for future amputation, inability to regain function of his tibial nerve, recurrent wound dehiscence and patellar tendon dysfunction, loss of motion, among others and he is willing to proceed.  OPERATIVE IMPLANTS: I used a size 9 x 5 mm polyethylene, the exact same size that he had before, which was the smallest polyethylene component possible for this knee system. This was for the polyethylene exchange. I used a Medical laboratory scientific officertryker component. For the patellar tendon repair I passed a total of 2 #2 FiberWire up and down the patellar tendon in a Krakw type configuration brought through drill holes in the patella missing the pegs on the patella.  OPERATIVE FINDINGS: There was no clear evidence for purulence or infection. There was substantial disruption of the medial parapatellar tissue, and complete loss of integrity of the patellar tendon at the junction of the inferior pole of the patella. There was a very small amount of anterior patellar tendon tissue on the lateral most side of the patella that was still attached to the patella, but the entire medial three quarters of the patella tendon was disrupted.  OPERATIVE PROCEDURE: The patient is brought to the operating room and placed in the supine position. Gen. anesthesia was administered. IV antibiotics were held until after cultures were taken. The left lower extremity was prepped and draped using a Betadine scrub and paint technique. Time out performed. Tourniquet was not utilized. His wound was already open, and I did not need to make any further skin incision. I reflected the skin edges,  cleaned with a rongeur and curette, and exposed the deep tissue and examined the patellar tendon with the above-named findings. I used the pulse lavage to debride the knee, however first I removed the  polyethylene using an osteotome. I took cultures deep to the polyethylene insert posteriorly in the back of the knee as well. The soft tissue in the posterior compartment of the knee was fairly taut, which may explain if he had a very tight knee and a substantial preoperative deformity that the tibial nerve may have been on stretch with the bony corrections during his knee replacement. This may explain his acute loss of nerve function.  Nonetheless I took deep cultures, gave antibiotics, and irrigated a total of 9 L of fluid through the knee. Complete debridement was carried out and I did not see any purulence.  I replaced the polyethylene and it seated nicely with a new polyethylene component. I examined the patellar button which was intact. I used a trial polyethylene to plan the direction of the drill holes in order to miss the pegs on the implanted polyethylene, and I did a total of 3 drill holes using the second smallest drill bit. Mellody Dance needles were utilized to shuttle #2 FiberWire up through the patella. First I had already placed the #2 FiberWire up and down the patellar tendon using a Krakw type locking configuration. I passed all the sutures through the patella, and then above the patella within the quadriceps tendon I used a curved free needle to bring the matching sides of the FiberWire onto themselves, to minimize soft tissue bridge. I then secured the patellar tendon to the patella with the knee in full extension and I had excellent re-apposition of soft tissue to bone.  I tied both sides of the FiberWire, and then used a 3-0 Vicryl to lay the small lateral flap of soft tissue over the patellar tendon, which may have been as much of a vascular augmentation as it was not particularly structural.  I irrigated the wounds copiously once more, and then repaired the parapatellar tissue with 0 Prolene in an interrupted fashion. The skin was closed with nylon, and sterile gauze was applied. A long leg  splint using 5 x 30 plaster slabs was applied with the knee in full extension.  He was awakened and returned to the PACU in stable and satisfactory condition. There were no complications and he tolerated the procedure well. This is a devastating complication, and hopefully he will heal, although long-term I am doubtful he will regain much flexion, and it remains to be seen what his neurologic function will be in that lower extremity. If we are able to salvage this leg than it will be a substantial success.

## 2015-07-19 NOTE — Anesthesia Preprocedure Evaluation (Signed)
Anesthesia Evaluation  Patient identified by MRN, date of birth, ID band Patient awake    Reviewed: Allergy & Precautions, NPO status , Patient's Chart, lab work & pertinent test results  Airway Mallampati: II   Neck ROM: full    Dental   Pulmonary shortness of breath, asthma , COPD,  oxygen dependent, Current Smoker,    breath sounds clear to auscultation       Cardiovascular hypertension,  Rhythm:regular Rate:Normal     Neuro/Psych Seizures -,     GI/Hepatic GERD  ,  Endo/Other    Renal/GU      Musculoskeletal  (+) Arthritis ,   Abdominal   Peds  Hematology   Anesthesia Other Findings   Reproductive/Obstetrics                             Anesthesia Physical Anesthesia Plan  ASA: III  Anesthesia Plan: General   Post-op Pain Management:    Induction: Intravenous  Airway Management Planned: LMA  Additional Equipment:   Intra-op Plan:   Post-operative Plan:   Informed Consent: I have reviewed the patients History and Physical, chart, labs and discussed the procedure including the risks, benefits and alternatives for the proposed anesthesia with the patient or authorized representative who has indicated his/her understanding and acceptance.     Plan Discussed with: CRNA, Anesthesiologist and Surgeon  Anesthesia Plan Comments:         Anesthesia Quick Evaluation

## 2015-07-19 NOTE — ED Notes (Signed)
Pt to ER for re-evaluation of left knee wound after knee replacement last week. EMS reports patient wound has dehisced. Pt vitals 120/70, HR 110. NAD on arrival. A/ox4.
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
## 2015-07-19 NOTE — Transfer of Care (Signed)
Immediate Anesthesia Transfer of Care Note  [name]  Procedure(s) Performed: Procedure(s): IRRIGATION AND DEBRIDEMENT KNEE WITH POLY EXCHANGE (Left) PATELLA TENDON REPAIR (Left)  Patient Location: PACU  Anesthesia Type:General  Level of Consciousness: awake, alert  and patient cooperative  Airway & Oxygen Therapy: Patient Spontanous Breathing and Patient connected to face mask oxygen  Post-op Assessment: Report given to RN and Post -op Vital signs reviewed and stable  Post vital signs: Reviewed and stable  Last Vitals:  Filed Vitals:   07/19/15 1545 07/19/15 1627  BP: 119/90 114/65  Pulse: 95 104  Temp:  36.7 C  Resp:  18    Last Pain:  Filed Vitals:   07/19/15 1627  PainSc: 10-Worst pain ever         Complications: No apparent anesthesia complications

## 2015-07-19 NOTE — Anesthesia Procedure Notes (Signed)
Procedure Name: Intubation Date/Time: 07/19/2015 8:47 PM Performed by: Julianne RiceBILOTTA, Elycia Woodside Z Pre-anesthesia Checklist: Patient identified, Timeout performed, Suction available, Patient being monitored and Emergency Drugs available Patient Re-evaluated:Patient Re-evaluated prior to inductionOxygen Delivery Method: Circle system utilized Preoxygenation: Pre-oxygenation with 100% oxygen Intubation Type: IV induction Ventilation: Mask ventilation without difficulty Laryngoscope Size: Mac and 3 Grade View: Grade II Tube type: Oral Tube size: 7.5 mm Number of attempts: 1 Airway Equipment and Method: Stylet Placement Confirmation: ETT inserted through vocal cords under direct vision,  breath sounds checked- equal and bilateral and positive ETCO2 Secured at: 22 cm Tube secured with: Tape Dental Injury: Teeth and Oropharynx as per pre-operative assessment

## 2015-07-20 ENCOUNTER — Encounter (HOSPITAL_COMMUNITY): Payer: Self-pay | Admitting: Orthopedic Surgery

## 2015-07-20 LAB — BASIC METABOLIC PANEL
Anion gap: 12 (ref 5–15)
BUN: 12 mg/dL (ref 6–20)
CO2: 21 mmol/L — ABNORMAL LOW (ref 22–32)
Calcium: 9.2 mg/dL (ref 8.9–10.3)
Chloride: 101 mmol/L (ref 101–111)
Creatinine, Ser: 0.78 mg/dL (ref 0.61–1.24)
GFR calc Af Amer: >60 mL/min (ref >60)
GFR calc non Af Amer: >60 mL/min (ref >60)
Glucose, Bld: 113 mg/dL — ABNORMAL HIGH (ref 65–99)
Potassium: 4.9 mmol/L (ref 3.5–5.1)
Sodium: 134 mmol/L — ABNORMAL LOW (ref 135–145)

## 2015-07-20 LAB — CBC
HCT: 32 % — ABNORMAL LOW (ref 39.0–52.0)
Hemoglobin: 10.3 g/dL — ABNORMAL LOW (ref 13.0–17.0)
MCH: 31.8 pg (ref 26.0–34.0)
MCHC: 32.2 g/dL (ref 30.0–36.0)
MCV: 98.8 fL (ref 78.0–100.0)
Platelets: 373 10*3/uL (ref 150–400)
RBC: 3.24 MIL/uL — ABNORMAL LOW (ref 4.22–5.81)
RDW: 13.4 % (ref 11.5–15.5)
WBC: 13.1 10*3/uL — ABNORMAL HIGH (ref 4.0–10.5)

## 2015-07-20 MED ORDER — DIPHENHYDRAMINE HCL 12.5 MG/5ML PO ELIX
12.5000 mg | ORAL_SOLUTION | ORAL | Status: DC | PRN
  Administered 2015-07-22 – 2015-07-23 (×2): 25 mg via ORAL
  Filled 2015-07-20 (×2): qty 10

## 2015-07-20 MED ORDER — CEFAZOLIN SODIUM-DEXTROSE 2-4 GM/100ML-% IV SOLN
2.0000 g | Freq: Four times a day (QID) | INTRAVENOUS | Status: DC
  Administered 2015-07-20 – 2015-07-22 (×11): 2 g via INTRAVENOUS
  Filled 2015-07-20 (×13): qty 100

## 2015-07-20 MED ORDER — DOCUSATE SODIUM 100 MG PO CAPS
100.0000 mg | ORAL_CAPSULE | Freq: Two times a day (BID) | ORAL | Status: DC
  Administered 2015-07-20 – 2015-07-23 (×7): 100 mg via ORAL
  Filled 2015-07-20 (×7): qty 1

## 2015-07-20 MED ORDER — BISACODYL 10 MG RE SUPP: 10.0000 mg | Freq: Every day | RECTAL | Status: DC | PRN

## 2015-07-20 MED ORDER — MAGNESIUM CITRATE PO SOLN
1.0000 | Freq: Once | ORAL | Status: DC | PRN
Start: 2015-07-20 — End: 2015-07-23

## 2015-07-20 MED ORDER — PHENOL 1.4 % MT LIQD: 1.0000 | OROMUCOSAL | Status: DC | PRN

## 2015-07-20 MED ORDER — ONDANSETRON HCL 4 MG/2ML IJ SOLN: 4.0000 mg | Freq: Four times a day (QID) | INTRAMUSCULAR | Status: DC | PRN

## 2015-07-20 MED ORDER — OXYCODONE HCL 5 MG PO TABS
5.0000 mg | ORAL_TABLET | ORAL | Status: DC | PRN
  Administered 2015-07-20 – 2015-07-23 (×16): 10 mg via ORAL
  Filled 2015-07-20 (×16): qty 2

## 2015-07-20 MED ORDER — HYDROCORTISONE NA SUCCINATE PF 250 MG IJ SOLR
INTRAMUSCULAR | Status: AC
  Filled 2015-07-20: qty 500

## 2015-07-20 MED ORDER — SODIUM CHLORIDE 0.9 % IV SOLN
INTRAVENOUS | Status: DC
  Administered 2015-07-20 – 2015-07-22 (×2): via INTRAVENOUS

## 2015-07-20 MED ORDER — HYDROMORPHONE HCL 1 MG/ML IJ SOLN
1.0000 mg | INTRAMUSCULAR | Status: DC | PRN
  Administered 2015-07-20 – 2015-07-23 (×7): 1 mg via INTRAVENOUS
  Filled 2015-07-20 (×7): qty 1

## 2015-07-20 MED ORDER — ACETAMINOPHEN 650 MG RE SUPP: 650.0000 mg | Freq: Four times a day (QID) | RECTAL | Status: DC | PRN

## 2015-07-20 MED ORDER — ALBUTEROL SULFATE (2.5 MG/3ML) 0.083% IN NEBU
2.5000 mg | INHALATION_SOLUTION | RESPIRATORY_TRACT | Status: DC | PRN
Start: 2015-07-20 — End: 2015-07-22

## 2015-07-20 MED ORDER — POLYETHYLENE GLYCOL 3350 17 G PO PACK: 17.0000 g | PACK | Freq: Every day | ORAL | Status: DC | PRN

## 2015-07-20 MED ORDER — SENNA 8.6 MG PO TABS
1.0000 | ORAL_TABLET | Freq: Two times a day (BID) | ORAL | Status: DC
  Administered 2015-07-20 – 2015-07-23 (×7): 8.6 mg via ORAL
  Filled 2015-07-20 (×7): qty 1

## 2015-07-20 MED ORDER — ASPIRIN EC 325 MG PO TBEC
325.0000 mg | DELAYED_RELEASE_TABLET | Freq: Every day | ORAL | Status: DC
  Administered 2015-07-20 – 2015-07-23 (×4): 325 mg via ORAL
  Filled 2015-07-20 (×4): qty 1

## 2015-07-20 MED ORDER — ACETAMINOPHEN 325 MG PO TABS
650.0000 mg | ORAL_TABLET | Freq: Four times a day (QID) | ORAL | Status: DC | PRN
  Administered 2015-07-21: 650 mg via ORAL
  Filled 2015-07-20: qty 2

## 2015-07-20 MED ORDER — METOCLOPRAMIDE HCL 5 MG PO TABS: 5.0000 mg | ORAL_TABLET | Freq: Three times a day (TID) | ORAL | Status: DC | PRN

## 2015-07-20 MED ORDER — MENTHOL 3 MG MT LOZG: 1.0000 | LOZENGE | OROMUCOSAL | Status: DC | PRN

## 2015-07-20 MED ORDER — ALBUTEROL SULFATE HFA 108 (90 BASE) MCG/ACT IN AERS: 1.0000 | INHALATION_SPRAY | Freq: Four times a day (QID) | RESPIRATORY_TRACT | Status: DC | PRN

## 2015-07-20 MED ORDER — ONDANSETRON HCL 4 MG PO TABS: 4.0000 mg | ORAL_TABLET | Freq: Four times a day (QID) | ORAL | Status: DC | PRN

## 2015-07-20 MED ORDER — ALUM & MAG HYDROXIDE-SIMETH 200-200-20 MG/5ML PO SUSP: 30.0000 mL | ORAL | Status: DC | PRN

## 2015-07-20 MED ORDER — METOCLOPRAMIDE HCL 5 MG/ML IJ SOLN: 5.0000 mg | Freq: Three times a day (TID) | INTRAMUSCULAR | Status: DC | PRN

## 2015-07-20 NOTE — Evaluation (Signed)
Physical Therapy Evaluation Patient Details Name: [name] MRN: [id number] DOB: 07/23/1955 Today's Date: 07/20/2015   History of Present Illness  Pt is a 60 y/o male with a PMH significant forrecent TKA on 07/07/15. Pt presents back to hospital s/p fall. Pt is now s/p I&D and patellar tendon repair on 07/19/15.  Clinical Impression  Pt admitted with above diagnosis. Pt currently with functional limitations due to the deficits listed below (see PT Problem List). At the time of PT eval pt was able to perform transfers with heavy mod to max assist for balance support and safety. Pt with difficulty maintaining precautions, however voiced that he was trying. Pt will benefit from skilled PT to increase their independence and safety with mobility to allow discharge to the venue listed below.      Follow Up Recommendations SNF;Supervision/Assistance - 24 hour    Equipment Recommendations  None recommended by PT    Recommendations for Other Services       Precautions / Restrictions Precautions Precautions: Knee;Fall Precaution Booklet Issued: No Restrictions Weight Bearing Restrictions: Yes LLE Weight Bearing: Touchdown weight bearing      Mobility  Bed Mobility Overal bed mobility: Needs Assistance Bed Mobility: Supine to Sit     Supine to sit: Mod assist     General bed mobility comments: Assist for LE movement towards EOB. Pt was able to use railings to scoot himself to EOB but increased time was required. PT held L LE up in air until he could rest foot on floor.   Transfers Overall transfer level: Needs assistance Equipment used: Rolling walker (2 wheeled) Transfers: Sit to/from [employer] CorporationStand;Stand Pivot Transfers Sit to Stand: Max assist Stand pivot transfers: Min assist       General transfer comment: Assist for power-up to full standing position. Pt was cued for TDWB status and verbalized that he was attempting to maintain throughout transfer to chair, however it is likely  he was putting weight through that LLE.   Ambulation/Gait             General Gait Details: Unable at this time.   Stairs            Wheelchair Mobility    Modified Rankin (Stroke Patients Only)       Balance Overall balance assessment: Needs assistance Sitting-balance support: Feet supported;No upper extremity supported Sitting balance-Leahy Scale: Good     Standing balance support: Bilateral upper extremity supported;During functional activity Standing balance-Leahy Scale: Poor                               Pertinent Vitals/Pain Pain Assessment: Faces Faces Pain Scale: Hurts whole lot Pain Location: L knee Pain Descriptors / Indicators: Operative site guarding;Sore Pain Intervention(s): Limited activity within patient's tolerance;Monitored during session;Repositioned    Home Living Family/patient expects to be discharged to:: Skilled nursing facility Living Arrangements: Alone                    Prior Function Level of Independence: Needs assistance   Gait / Transfers Assistance Needed: Uses rollator which is missing R brake  ADL's / Homemaking Assistance Needed: PCA assists with cooking, cleaning, laundry, bill and medication management, and grocery shopping.        Hand Dominance   Dominant Hand: Right    Extremity/Trunk Assessment   Upper Extremity Assessment: Defer to OT evaluation  Lower Extremity Assessment: LLE deficits/detail   LLE Deficits / Details: Pt with splint under wrapped knee. No knee flexion at this time.   Cervical / Trunk Assessment: Normal  Communication   Communication: No difficulties  Cognition Arousal/Alertness: Awake/alert Behavior During Therapy: WFL for tasks assessed/performed Overall Cognitive Status: Within Functional Limits for tasks assessed       Memory: Decreased recall of precautions              General Comments      Exercises        Assessment/Plan     PT Assessment Patient needs continued PT services  PT Diagnosis Difficulty walking;Acute pain   PT Problem List Decreased strength;Decreased range of motion;Decreased activity tolerance;Decreased balance;Decreased mobility;Decreased knowledge of use of DME;Decreased safety awareness;Decreased knowledge of precautions;Pain  PT Treatment Interventions DME instruction;Gait training;Functional mobility training;Therapeutic activities;Therapeutic exercise;Balance training;Patient/family education;Neuromuscular re-education   PT Goals (Current goals can be found in the Care Plan section) Acute Rehab PT Goals Patient Stated Goal: Get better PT Goal Formulation: With patient Time For Goal Achievement: 08/03/15 Potential to Achieve Goals: Good    Frequency Min 3X/week   Barriers to discharge        Co-evaluation               End of Session Equipment Utilized During Treatment: Gait belt Activity Tolerance: Patient tolerated treatment well Patient left: in chair;with call bell/phone within reach;with chair alarm set Nurse Communication: Mobility status         Time: 1349-1417 PT Time Calculation (min) (ACUTE ONLY): 28 min   Charges:   PT Evaluation $PT Eval Moderate Complexity: 1 Procedure PT Treatments $Gait Training: 8-22 mins   PT G Codes:        Conni Slipper 07-25-2015, 2:57 PM   Conni Slipper, PT, DPT Acute Rehabilitation Services Pager: [phone]

## 2015-07-20 NOTE — Care Management Important Message (Signed)
Important Message  Patient Details  Name: Neomia DearWillie J Rubiano MRN: [id number] Date of Birth: 07/31/1955   Medicare Important Message Given:  Yes    Bernadette HoitShoffner, Nyrie Sigal Coleman 07/20/2015, 12:02 PM

## 2015-07-20 NOTE — Progress Notes (Signed)
Orthopedic Tech Progress Note Patient Details:  [name] 11/03/1955 [id number]  Ortho Devices Type of Ortho Device: Postop shoe/boot Ortho Device/Splint Location: LLE prafo boot Ortho Device/Splint Interventions: Ordered, Application   Jennye MoccasinHughes, Bryton Romagnoli Craig 07/20/2015, 5:42 PM

## 2015-07-20 NOTE — Progress Notes (Signed)
PA Janace LittenBrandon Parry at [phone] notified of gram negative rods present in synovial lfluid of pt as notified by lab.

## 2015-07-20 NOTE — Consult Note (Signed)
   [facility]   07/20/2015  [name] 06/11/1955 [id number]    [facility]. Chart reviewed. Noted Primary Care is Dr. Concepcion ElkAvbuere. Went to bedside to speak with patient and confirm.  Dr. Concepcion ElkAvbuere is not a [facility].Therefore, patient is not eligible [facility] at this time. Made inpatient RNCM aware.    Raiford NobleAtika Hall, MSN-Ed, RN,BSN [facility] Care [facility] Hospital Liaison [phone]

## 2015-07-20 NOTE — Progress Notes (Signed)
OT Cancellation Note  Patient Details Name: [name] MRN: [id number] DOB: 07/23/1955   Cancelled Treatment:    Reason Eval/Treat Not Completed:  (OT screened) Pt's current D/C plan is SNF. No apparent immediate acute care OT needs, therefore will defer OT to SNF. If OT eval is needed please call Acute Rehab Dept. at [phone] or text page OT at [phone].    [name], [name] OTR/[name] [phone] 07/20/2015, 5:31 PM
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
## 2015-07-21 DIAGNOSIS — B9689 Other specified bacterial agents as the cause of diseases classified elsewhere: Secondary | ICD-10-CM

## 2015-07-21 DIAGNOSIS — T8131XD Disruption of external operation (surgical) wound, not elsewhere classified, subsequent encounter: Secondary | ICD-10-CM

## 2015-07-21 DIAGNOSIS — Y792 Prosthetic and other implants, materials and accessory orthopedic devices associated with adverse incidents: Secondary | ICD-10-CM

## 2015-07-21 LAB — CBC
HCT: 26.7 % — ABNORMAL LOW (ref 39.0–52.0)
Hemoglobin: 8.7 g/dL — ABNORMAL LOW (ref 13.0–17.0)
MCH: 32 pg (ref 26.0–34.0)
MCHC: 32.6 g/dL (ref 30.0–36.0)
MCV: 98.2 fL (ref 78.0–100.0)
Platelets: 351 10*3/uL (ref 150–400)
RBC: 2.72 MIL/uL — ABNORMAL LOW (ref 4.22–5.81)
RDW: 13.5 % (ref 11.5–15.5)
WBC: 13 10*3/uL — ABNORMAL HIGH (ref 4.0–10.5)
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
## 2015-07-21 NOTE — Evaluation (Signed)
Occupational Therapy Evaluation Patient Details Name: [name] MRN: [id number] DOB: 1955-08-02 Today's Date: 07/21/2015    History of Present Illness Pt is a 61 y/o male with a PMH significant forrecent TKA on 07/07/15. Pt presents back to hospital s/p fall. Pt is now s/p I&D and patellar tendon repair on 07/19/15.   Clinical Impression   OT completed evaluation due to patient's insurance being managed Medicare, wanted to ensure no delay in transition from hospital to SNF. Patient presenting with decreased ADL and IADL independence. Patient mod I PTA. Patient currently functioning at an overall min to mod assist level for functional mobility and requires up to max assist for LB ADLs. Patient's weight bearing status has recently changed to TDWB and due to this, do not believe pt will be able to manage at home alone for most of the time. Patient will benefit from acute OT to increase overall independence in the areas of ADLs, functional mobility, and overall safety in order to safely discharge to venue listed below.     Follow Up Recommendations  SNF;Supervision/Assistance - 24 hour    Equipment Recommendations  Other (comment) (TBD next venue of care)    Recommendations for Other Services  none at this time    Precautions / Restrictions Precautions Precautions: Knee;Fall Precaution Booklet Issued: No Precaution Comments: Reviewed not placing ice pack, pillow, or other object under knee Required Braces or Orthoses: Knee Immobilizer - Left Restrictions Weight Bearing Restrictions: Yes LLE Weight Bearing: Touchdown weight bearing    Mobility Bed Mobility Overal bed mobility: Needs Assistance Bed Mobility: Supine to Sit     Supine to sit: Min assist     General bed mobility comments: Assist for LE movement towards EOB. Pt was able to use railings to scoot himself to EOB but increased time was required. Therapist held L LE up in air until he could rest foot on floor.    Transfers Overall transfer level: Needs assistance Equipment used: Rolling walker (2 wheeled) Transfers: Sit to/from [employer] Sit to Stand: Mod assist Stand pivot transfers: Min assist       General transfer comment: Assist for power-up to full standing position. Pt was cued for TDWB status.    Balance Overall balance assessment: Needs assistance Sitting-balance support: Feet supported;No upper extremity supported Sitting balance-Leahy Scale: Good     Standing balance support: Bilateral upper extremity supported;During functional activity Standing balance-Leahy Scale: Poor Standing balance comment: Heavy reliance on RW for UE support to maintain balance    ADL Overall ADL's : Needs assistance/impaired Eating/Feeding: Set up;Sitting   Grooming: Set up;Sitting   Upper Body Bathing: Set up;Sitting   Lower Body Bathing: Moderate assistance;Sit to/from stand   Upper Body Dressing : Set up;Sitting   Lower Body Dressing: Moderate assistance;Sit to/from stand   Toilet Transfer: Moderate assistance;RW;BSC;Stand-pivot Toilet Transfer Details (indicate cue type and reason): simulated; EOB to recliner transfer      Pertinent Vitals/Pain Pain Assessment: No/denies pain     Hand Dominance Right   Extremity/Trunk Assessment Upper Extremity Assessment Upper Extremity Assessment: Generalized weakness   Lower Extremity Assessment Lower Extremity Assessment: Defer to PT evaluation   Cervical / Trunk Assessment Cervical / Trunk Assessment: Normal   Communication Communication Communication: No difficulties   Cognition Arousal/Alertness: Awake/alert Behavior During Therapy: WFL for tasks assessed/performed Overall Cognitive Status: Within Functional Limits for tasks assessed       Memory: Decreased recall of precautions  Home Living Family/patient expects to be discharged to:: Skilled nursing facility Living Arrangements:  Alone Available Help at Discharge: Personal care attendant;Available PRN/intermittently Type of Home: Apartment Home Access: Level entry     Home Layout: One level     Bathroom Shower/Tub: Tub/shower unit;Curtain Shower/tub characteristics: Engineer, building servicesCurtain Bathroom Toilet: Standard     Home Equipment: Environmental consultantWalker - 2 wheels;Walker - 4 wheels   Prior Functioning/Environment Level of Independence: Needs assistance  Gait / Transfers Assistance Needed: Uses rollator which is missing R brake ADL's / Homemaking Assistance Needed: PCA assists with cooking, cleaning, laundry, bill and medication management, and grocery shopping.      OT Diagnosis: Acute pain;Cognitive deficits;Generalized weakness   OT Problem List: Decreased strength;Decreased range of motion;Decreased activity tolerance;Impaired balance (sitting and/or standing);Decreased safety awareness;Decreased knowledge of use of DME or AE;Decreased knowledge of precautions;Decreased cognition;Pain;Increased edema   OT Treatment/Interventions: Self-care/ADL training;Therapeutic exercise;Energy conservation;DME and/or AE instruction;Therapeutic activities;Patient/family education;Balance training    OT Goals(Current goals can be found in the care plan section) Acute Rehab OT Goals Patient Stated Goal: Go to rehab OT Goal Formulation: With patient Time For Goal Achievement: 08/04/15 Potential to Achieve Goals: Good ADL Goals Pt Will Perform Grooming: with supervision;standing Pt Will Perform Lower Body Bathing: with min assist;sit to/from stand;with adaptive equipment Pt Will Perform Lower Body Dressing: with min assist;with adaptive equipment;sit to/from stand Pt Will Transfer to Toilet: with supervision;bedside commode Additional ADL Goal #1: Pt will be mod I with bed mobility in order to decrease burden of care and as a precursor for ADLs   OT Frequency: Min 2X/week   Barriers to D/C: Decreased caregiver support   End of Session  Equipment Utilized During Treatment: Gait belt;Rolling walker;Left knee immobilizer CPM Left Knee CPM Left Knee: Off  Activity Tolerance: Patient tolerated treatment well Patient left: in chair;with call bell/phone within reach;with chair alarm set   Time: [id number] OT Time Calculation (min): 13 min Charges:  OT General Charges $OT Visit: 1 Procedure OT Evaluation $OT Eval Moderate Complexity: 1 Procedure  Edwin CapPatricia Luna Audia , MS, OTR/L, CLT  Pager: [phone]  07/21/2015, 12:59 PM

## 2015-07-21 NOTE — Anesthesia Postprocedure Evaluation (Signed)
Anesthesia Post Note  [name]  Procedure(s) Performed: Procedure(s) (LRB): IRRIGATION AND DEBRIDEMENT KNEE WITH POLY EXCHANGE (Left) PATELLA TENDON REPAIR (Left)  Patient location during evaluation: PACU Anesthesia Type: General Level of consciousness: awake and alert and patient cooperative Pain management: pain level controlled Vital Signs Assessment: post-procedure vital signs reviewed and stable Respiratory status: spontaneous breathing and respiratory function stable Cardiovascular status: stable Anesthetic complications: no    Last Vitals:  Filed Vitals:   07/21/15 0551 07/21/15 0908  BP: 129/67 129/83  Pulse: 82 103  Temp: 37 C 37.1 C  Resp: 18 18    Last Pain:  Filed Vitals:   07/21/15 1218  PainSc: Asleep                 Lakaya Tolen S
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
## 2015-07-21 NOTE — Progress Notes (Signed)
Orthopedic Tech Progress Note Patient Details:  [name] 11/09/1955 [id number]  Patient ID: [name], male   DOB: 11/07/1955, 60 y.o.   MRN: [id number] Called in bio-tech brace order; spoke with Tedd SiasErin  Brizeyda [name] 07/21/2015, 12:10 PM

## 2015-07-21 NOTE — Clinical Social Work Note (Signed)
Clinical Social Work Assessment  Patient Details  Name: [name] MRN: [id number] Date of Birth: 06/12/1955  Date of referral:  07/21/15               Reason for consult:  Facility Placement                Permission sought to share information with:  Chartered certified accountant granted to share information::  Yes, Verbal Permission Granted  Name::        Agency::  [facility]  Relationship::     Contact Information:     Housing/Transportation Living arrangements for the past 2 months:  [place] of Information:  Patient Patient Interpreter Needed:  None Criminal Activity/Legal Involvement Pertinent to Current Situation/Hospitalization:  No - Comment as needed Significant Relationships:  None Lives with:  Self Do you feel safe going back to the place where you live?  No Need for family participation in patient care:  No (Coment) (Patient able to make own decisions.)  Care giving concerns:  Patient expressed no concerns at this time.   Social Worker assessment / plan:  LCSW met with patient at bedside to discuss discharge planning. Patient agreeable to PT recommendation for SNF placement. LCSW presented bed offers to patient, patient has chosen [name]. LCSW to continue to follow and assist with discharge planning needs.  Employment status:  Retired Science writer) PT Recommendations:  [place] / Referral to community resources:  [place]  Patient/Family's Response to care:  Patient understanding and agreeable to [name] of care.  Patient/Family's Understanding of and Emotional Response to Diagnosis, Current Treatment, and Prognosis:  Patient understanding and agreeable to LCSW plan of care.  Emotional Assessment Appearance:  Appears stated age Attitude/Demeanor/Rapport:  Other (Appropriate.) Affect (typically observed):  Accepting, Appropriate, Pleasant,  Quiet Orientation:  Oriented to Self, Oriented to Place, Oriented to  Time, Oriented to Situation Alcohol / Substance use:  Not Applicable Psych involvement (Current and /or in the community):  No (Comment) (Not appropriate on this admission.)  Discharge Needs  Concerns to be addressed:  No discharge needs identified Readmission within the last 30 days:  No Current discharge risk:  None Barriers to Discharge:  No Barriers Identified   Caroline Sauger, LCSW 07/21/2015, 3:34 PM [phone]

## 2015-07-21 NOTE — NC FL2 (Signed)
[place] MEDICAID FL2 LEVEL OF CARE SCREENING TOOL     IDENTIFICATION  Patient Name: [name] Birthdate: 10/15/1955 Sex: male Admission Date (Current Location): 07/19/2015  [facility] [place] [facility] [place]   [facility] and Address:  [facility], [address], [place], [place] [address]      Provider Number: [id number]  Attending Physician Name and Address:  Sheral Apleyimothy D Murphy, MD  Relative Name and Phone Number:       Current Level of Care: Hospital Recommended Level of Care: Skilled Nursing Facility Prior Approval Number:    Date Approved/Denied:   PASRR Number: [id number]  Discharge Plan: SNF    Current Diagnoses: Patient Active Problem List   Diagnosis Date Noted  . Wound dehiscence 07/19/2015  . Rupture of left patellar tendon, open, post-total knee replacement 07/19/2015  . Patellar tendon rupture 07/19/2015  . Primary osteoarthritis of left knee 06/18/2015  . Chronic obstructive airway disease with asthma (HCC) 06/18/2015  . Essential hypertension 06/18/2015  . Special screening for malignant neoplasms, colon 12/11/2012    Orientation RESPIRATION BLADDER Height & Weight     Self, Time, Situation, Place  Normal Continent Weight:   Height:     BEHAVIORAL SYMPTOMS/MOOD NEUROLOGICAL BOWEL NUTRITION STATUS      Continent Diet (Please see discharge summary.)  AMBULATORY STATUS COMMUNICATION OF NEEDS Skin    (Moderate) Verbally Surgical wounds                       Personal Care Assistance Level of Assistance  Bathing, Feeding, Dressing Bathing Assistance:  (Moderate) Feeding assistance: Independent Dressing Assistance:  (Moderate)     Functional Limitations Info             SPECIAL CARE FACTORS FREQUENCY  PT (By licensed PT), OT (By licensed OT)     PT Frequency: 5 OT Frequency: 5            Contractures      Additional Factors Info  Code Status, Allergies Code Status Info: Not on  file Allergies Info: No known allergies           Current Medications (07/21/2015):  This is the current hospital active medication list Current Facility-Administered Medications  Medication Dose Route Frequency Provider Last Rate Last Dose  . 0.9 %  sodium chloride infusion   Intravenous Continuous Sheral Apleyimothy D Murphy, MD 50 mL/hr at 07/20/15 2008    . acetaminophen (TYLENOL) tablet 650 mg  650 mg Oral Q6H PRN Teryl LucyJoshua Landau, MD   650 mg at 07/21/15 [id number]   Or  . acetaminophen (TYLENOL) suppository 650 mg  650 mg Rectal Q6H PRN Teryl LucyJoshua Landau, MD      . albuterol (PROVENTIL) (2.5 MG/3ML) 0.083% nebulizer solution 2.5 mg  2.5 mg Nebulization Q4H PRN Teryl LucyJoshua Landau, MD      . alum & mag hydroxide-simeth (MAALOX/MYLANTA) 200-200-20 MG/5ML suspension 30 mL  30 mL Oral Q4H PRN Teryl LucyJoshua Landau, MD      . aspirin EC tablet 325 mg  325 mg Oral Q breakfast Teryl LucyJoshua Landau, MD   325 mg at 07/21/15 0909  . bisacodyl (DULCOLAX) suppository 10 mg  10 mg Rectal Daily PRN Teryl LucyJoshua Landau, MD      . ceFAZolin (ANCEF) IVPB 2g/100 mL premix  2 g Intravenous Q6H Teryl LucyJoshua Landau, MD   2 g at 07/21/15 [id number]  . diphenhydrAMINE (BENADRYL) 12.5 MG/5ML elixir 12.5-25 mg  12.5-25 mg Oral Q4H PRN Teryl LucyJoshua Landau,  MD      . docusate sodium (COLACE) capsule 100 mg  100 mg Oral BID [name], MD   100 mg at 07/21/15 0909  . HYDROmorphone (DILAUDID) injection 1 mg  1 mg Intravenous Q2H PRN [name], MD   1 mg at 07/20/15 2245  . lisinopril (PRINIVIL,ZESTRIL) tablet 20 mg  20 mg Oral BH-q7a Jessica L Focht, PA   20 mg at 07/21/15 1610  . magnesium citrate solution 1 Bottle  1 Bottle Oral Once PRN [name], MD      . menthol-cetylpyridinium (CEPACOL) lozenge 3 mg  1 lozenge Oral PRN [name], MD       Or  . phenol (CHLORASEPTIC) mouth spray 1 spray  1 spray Mouth/Throat PRN [name], MD      . methocarbamol (ROBAXIN) tablet 500 mg  500 mg Oral Q6H PRN [name], MD   500 mg at 07/21/15 9604   Or  .  methocarbamol (ROBAXIN) 500 mg in dextrose 5 % 50 mL IVPB  500 mg Intravenous Q6H PRN [name], MD      . metoCLOPramide (REGLAN) tablet 5-10 mg  5-10 mg Oral Q8H PRN [name], MD       Or  . metoCLOPramide (REGLAN) injection 5-10 mg  5-10 mg Intravenous Q8H PRN [name], MD      . ondansetron [facility]) tablet 4 mg  4 mg Oral Q6H PRN [name], MD       Or  . ondansetron [facility]) injection 4 mg  4 mg Intravenous Q6H PRN [name], MD      . oxyCODONE (Oxy IR/ROXICODONE) immediate release tablet 5-10 mg  5-10 mg Oral Q3H PRN [name], MD   10 mg at 07/21/15 0909  . polyethylene glycol (MIRALAX / GLYCOLAX) packet 17 g  17 g Oral Daily PRN [name], MD      . senna [facility]) tablet 8.6 mg  1 tablet Oral BID [name], MD   8.6 mg at 07/21/15 0909     Discharge Medications: Please see discharge summary for a list of discharge medications.  Relevant Imaging Results:  Relevant Lab Results:   Additional Information SSN: [id number]  Rod Mae, LCSW

## 2015-07-21 NOTE — Progress Notes (Signed)
[facility] Dr. Wandra Feinstein. Murphy, about 1730 to ask about 15 degree flexion of bledsoe splint per [employer] TE[employer] Rep, Peyton NajjarLarry.  HC stated that patient should have splint on and bledsoe brace should be taken home with patient.  Bledsoe brace removed and splint reapplied at 1820.

## 2015-07-22 LAB — CBC
HCT: 27.2 % — ABNORMAL LOW (ref 39.0–52.0)
Hemoglobin: 8.6 g/dL — ABNORMAL LOW (ref 13.0–17.0)
MCH: 31 pg (ref 26.0–34.0)
MCHC: 31.6 g/dL (ref 30.0–36.0)
MCV: 98.2 fL (ref 78.0–100.0)
Platelets: 380 10*3/uL (ref 150–400)
RBC: 2.77 MIL/uL — ABNORMAL LOW (ref 4.22–5.81)
RDW: 13.3 % (ref 11.5–15.5)
WBC: 10.4 10*3/uL (ref 4.0–10.5)

## 2015-07-22 MED ORDER — METHOCARBAMOL 500 MG PO TABS: 500.0000 mg | ORAL_TABLET | Freq: Four times a day (QID) | ORAL | Status: DC | PRN

## 2015-07-22 MED ORDER — ALBUTEROL SULFATE (2.5 MG/3ML) 0.083% IN NEBU: 2.5000 mg | INHALATION_SOLUTION | Freq: Four times a day (QID) | RESPIRATORY_TRACT | Status: DC | PRN

## 2015-07-22 MED ORDER — ALBUTEROL SULFATE (2.5 MG/3ML) 0.083% IN NEBU
2.5000 mg | INHALATION_SOLUTION | Freq: Four times a day (QID) | RESPIRATORY_TRACT | Status: DC
  Administered 2015-07-22 (×3): 2.5 mg via RESPIRATORY_TRACT
  Filled 2015-07-22 (×3): qty 3

## 2015-07-22 MED ORDER — DEXTROSE 5 % IV SOLN
2.0000 g | INTRAVENOUS | Status: DC
  Administered 2015-07-22: 2 g via INTRAVENOUS
  Filled 2015-07-22 (×3): qty 2

## 2015-07-22 MED ORDER — SODIUM CHLORIDE 0.9% FLUSH: 10.0000 mL | INTRAVENOUS | Status: DC | PRN

## 2015-07-22 MED ORDER — ALTEPLASE 2 MG IJ SOLR: 2.0000 mg | Freq: Once | INTRAMUSCULAR | Status: DC

## 2015-07-22 MED ORDER — ALBUTEROL SULFATE (2.5 MG/3ML) 0.083% IN NEBU
2.5000 mg | INHALATION_SOLUTION | Freq: Three times a day (TID) | RESPIRATORY_TRACT | Status: DC
  Administered 2015-07-23: 2.5 mg via RESPIRATORY_TRACT
  Filled 2015-07-22: qty 3

## 2015-07-22 MED ORDER — OXYCODONE-ACETAMINOPHEN 5-325 MG PO TABS: 1.0000 | ORAL_TABLET | ORAL | Status: DC | PRN

## 2015-07-22 MED ORDER — ONDANSETRON HCL 4 MG PO TABS: 4.0000 mg | ORAL_TABLET | Freq: Three times a day (TID) | ORAL | Status: DC | PRN

## 2015-07-22 MED ORDER — ALBUTEROL SULFATE (2.5 MG/3ML) 0.083% IN NEBU: 2.5000 mg | INHALATION_SOLUTION | Freq: Three times a day (TID) | RESPIRATORY_TRACT | Status: DC
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
## 2015-07-22 NOTE — Progress Notes (Signed)
Peripherally Inserted Central Catheter/Midline Placement  The IV Nurse has discussed with the patient and/or persons authorized to consent for the patient, the purpose of this procedure and the potential benefits and risks involved with this procedure.  The benefits include less needle sticks, lab draws from the catheter and patient may be discharged home with the catheter.  Risks include, but not limited to, infection, bleeding, blood clot (thrombus formation), and puncture of an artery; nerve damage and irregular heat beat.  Alternatives to this procedure were also discussed.  PICC/Midline Placement Documentation  PICC Single Lumen 07/22/15 PICC Right Brachial 41 cm 0 cm (Active)  Indication for Insertion or Continuance of Line Home intravenous therapies (PICC only) 07/22/2015  3:00 PM  Exposed Catheter (cm) 0 cm 07/22/2015  3:00 PM  Dressing Change Due 07/29/15 07/22/2015  3:00 PM       Stacie GlazeJoyce, Caci Orren Horton 07/22/2015, 3:35 PM

## 2015-07-22 NOTE — Progress Notes (Signed)
[facility]   Reason for visit: Follow up on PJI  Interval History: had picc line placed.  No fever, culture now with Strep pneumonia and Enterobacter, fluoroquinolone sensitive, cefazolin resistant, ceftriaxone sensitive.  Physical Exam: Constitutional:  Filed Vitals:   07/22/15 0534 07/22/15 1500  BP: 122/77 137/72  Pulse: 71 84  Temp: 98.6 F (37 C) 99.9 F (37.7 C)  Resp: 16 18   patient appears in NAD Respiratory: Normal respiratory effort; CTA B Cardiovascular: RRR MS: leg wrapped  Review of Systems: Constitutional: negative for fevers and chills Gastrointestinal: negative for diarrhea  Lab Results  Component Value Date   WBC 10.4 07/22/2015   HGB 8.6* 07/22/2015   HCT 27.2* 07/22/2015   MCV 98.2 07/22/2015   PLT 380 07/22/2015    Lab Results  Component Value Date   CREATININE 0.78 07/20/2015   BUN 12 07/20/2015   NA 134* 07/20/2015   K 4.9 07/20/2015   CL 101 07/20/2015   CO2 21* 07/20/2015    Lab Results  Component Value Date   ALT 20 07/07/2015   AST 23 07/07/2015   ALKPHOS 89 07/07/2015     Microbiology: Recent Results (from the past 240 hour(s))  Aerobic/Anaerobic Culture(surg specimen) (NOT AT [facility])     Status: None (Preliminary result)   Collection Time: 07/19/15  9:27 PM  Result Value Ref Range Status   Specimen Description FLUID SYNOVIAL LEFT KNEE  Final   Special Requests ID A  SWABS SENT  Final   Gram Stain   Final    FEW WBC PRESENT,BOTH PMN AND MONONUCLEAR NO ORGANISMS SEEN    Culture   Final    FEW ENTEROBACTER CLOACAE FEW GRAM POSITIVE COCCI CRITICAL RESULT CALLED TO, READ BACK BY AND VERIFIED WITH: Nancie Neas, RN AT 1330 ON [id number] BY Lucienne Capers    Report Status PENDING  Incomplete   Organism ID, Bacteria ENTEROBACTER CLOACAE  Final      Susceptibility   Enterobacter cloacae - MIC*    CEFAZOLIN >=64 RESISTANT Resistant     CEFEPIME <=1 SENSITIVE Sensitive     CEFTAZIDIME <=1 SENSITIVE Sensitive      CEFTRIAXONE <=1 SENSITIVE Sensitive     CIPROFLOXACIN <=0.25 SENSITIVE Sensitive     GENTAMICIN <=1 SENSITIVE Sensitive     IMIPENEM <=0.25 SENSITIVE Sensitive     TRIMETH/SULFA <=20 SENSITIVE Sensitive     PIP/TAZO <=4 SENSITIVE Sensitive     * FEW ENTEROBACTER CLOACAE  Aerobic/Anaerobic Culture(surg specimen) (NOT AT [facility])     Status: None (Preliminary result)   Collection Time: 07/19/15  9:33 PM  Result Value Ref Range Status   Specimen Description FLUID SYNOVIAL LEFT KNEE  Final   Special Requests ID B SYRINGE SENT  Final   Gram Stain   Final    MANY WBC PRESENT,BOTH PMN AND MONONUCLEAR NO ORGANISMS SEEN    Culture   Final    FEW ENTEROBACTER CLOACAE FEW STREPTOCOCCUS PNEUMONIAE CRITICAL RESULT CALLED TO, READ BACK BY AND VERIFIED WITH: [name],RN AT 1330 ON [id number] BY S. YARBROUGH SUSCEPTIBILITIES PERFORMED ON PREVIOUS CULTURE WITHIN THE LAST 5 DAYS. ENTEROBACTER CLOACAE    Report Status PENDING  Incomplete   Organism ID, Bacteria STREPTOCOCCUS PNEUMONIAE  Final      Susceptibility   Streptococcus pneumoniae - MIC*    ERYTHROMYCIN <=0.12 SENSITIVE Sensitive     LEVOFLOXACIN 0.5 SENSITIVE Sensitive     * FEW STREPTOCOCCUS PNEUMONIAE  Aerobic/Anaerobic Culture(surg specimen) (NOT AT  [facility])     Status: None (Preliminary result)   Collection Time: 07/19/15  9:35 PM  Result Value Ref Range Status   Specimen Description FLUID SYNOVIAL  Final   Special Requests DEEP LEFT KNEE ID C SWABS SENT  Final   Gram Stain   Final    ABUNDANT WBC PRESENT,BOTH PMN AND MONONUCLEAR NO ORGANISMS SEEN    Culture   Final    FEW ENTEROBACTER CLOACAE SUSCEPTIBILITIES PERFORMED ON PREVIOUS CULTURE WITHIN THE LAST 5 DAYS. FEW GRAM POSITIVE COCCI CRITICAL RESULT CALLED TO, READ BACK BY AND VERIFIED WITH: [name],RN AT 1330 ON [id number] BY Lucienne CapersS. YARBROUGH    Report Status PENDING  Incomplete    Impression/Plan:  1. PJI s/p polyexchange - both Enterobacter and Strep.  Can use oral levaquin 750 mg  daily or ceftriaxone 2 grams IV daily. Has picc line so can use ceftriaxone 2 grams daily for 6 weeks through July 18.   Can then convert to oral continuation therapy to bactrim for 3-6 months.  Weekly cbc, cmp Antibiotics per SNF/home health protocol 2. dispo - going to rehab.    We will arrange follow up in our clinic in 3-4 weeks  Thanks for consult, I will sign off.

## 2015-07-22 NOTE — Clinical Social Work Note (Signed)
Patient has bed available at [facility] once medically stable for discharge. LCSW to continue to follow and assist with discharge planning needs.  Marcelline Deistmily Maraki Macquarrie, LCSW [phone] Orthopedics: [phone] Surgical: [phone]

## 2015-07-22 NOTE — Progress Notes (Signed)
Physical Therapy Treatment Patient Details Name: [name] J Baune MRN: [id number] DOB: 12/11/1955 Today's Date: 07/22/2015    History of Present Illness Pt is a 60 y/o male with a PMH significant forrecent TKA on 07/07/15. Pt presents back to hospital s/p fall. Pt is now s/p I&D and patellar tendon repair on 07/19/15.    PT Comments    Min A for supine to sit and to take several pivotal steps with RW to recliner. Pt has difficulty advancing RLE to ambulate and standing tolerance is significantly limited by pain. Performed LLE exercises. 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   Follow Up Recommendations  SNF;Supervision/Assistance - 24 hour     Equipment Recommendations  None recommended by PT    Recommendations for Other Services       Precautions / Restrictions Precautions Precautions: Knee;Fall Precaution Booklet Issued: No Required Braces or Orthoses: Knee Immobilizer - Left;Other Brace/Splint (PRAFO for L foot drop) Knee Immobilizer - Left: On at all times Restrictions Weight Bearing Restrictions: Yes LLE Weight Bearing: Touchdown weight bearing    Mobility  Bed Mobility Overal bed mobility: Needs Assistance Bed Mobility: Supine to Sit     Supine to sit: Min assist     General bed mobility comments: Assist for LE movement towards EOB.   Transfers Overall transfer level: Needs assistance Equipment used: Rolling walker (2 wheeled) Transfers: Sit to/from Stand Sit to Stand: Min assist         General transfer comment: Assist for power-up to full standing position.   Ambulation/Gait Ambulation/Gait assistance: Min assist Ambulation Distance (Feet): 3 Feet Assistive device: Rolling walker (2 wheeled) Gait Pattern/deviations: Step-to pattern;Decreased step length - right;Decreased step length - left;Antalgic Gait velocity: decreased   General Gait Details: pt took 4 pivotal steps from bed to recliner, attempted to sit prior to  reaching recliner, VCs to WB thru BUEs, pt appears to attempt adhering to TDWB LLE but has difficulty advancing RLE, standing tolerance limited by pain   Stairs            Wheelchair Mobility    Modified Rankin (Stroke Patients Only)       Balance     Sitting balance-Leahy Scale: Good       Standing balance-Leahy Scale: Poor                      Cognition Arousal/Alertness: Awake/alert Behavior During Therapy: WFL for tasks assessed/performed Overall Cognitive Status: Within Functional Limits for tasks assessed                      Exercises Total Joint Exercises Ankle Circles/Pumps: PROM;Left;10 reps Ankle Circles/Pumps Limitations: no active motion noted left ankle Gluteal Sets: AROM;Supine;5 reps Hip ABduction/ADduction: AAROM;Left;10 reps;Supine Straight Leg Raises: Left;10 reps;Supine;AAROM    General Comments        Pertinent Vitals/Pain Pain Score: 10-Worst pain ever Pain Location: 10/10 L knee with standing, 4/10 at rest Pain Descriptors / Indicators: Sore Pain Intervention(s): Limited activity within patient's tolerance;Monitored during session;Premedicated before session    Home Living                      Prior Function            PT Goals (current goals can now be found in the care plan section) Acute Rehab PT Goals Patient Stated Goal: DC to SNF so I don't have to  come back to the hosptial PT Goal Formulation: With patient Time For Goal Achievement: 08/03/15 Potential to Achieve Goals: Good Progress towards PT goals: Progressing toward goals    Frequency  Min 3X/week    PT Plan Current plan remains appropriate    Co-evaluation             End of Session Equipment Utilized During Treatment: Gait belt Activity Tolerance: Patient limited by pain Patient left: in chair;with call bell/phone within reach;with chair alarm set     Time: 1001-1030 PT Time Calculation (min) (ACUTE ONLY): 29  min  Charges:  $Gait Training: 8-22 mins $Therapeutic Exercise: 8-22 mins                    G Codes:      Tamala Ser 07/22/2015, 10:40 AM [phone]

## 2015-07-22 NOTE — Progress Notes (Signed)
    [facility]   Reason for visit: Follow up on PJI  Interval History: GNR still in culture  Physical Exam: Constitutional:  Filed Vitals:   07/21/15 2024 07/22/15 0534  BP: 131/81 122/77  Pulse: 84 71  Temp: 99.3 F (37.4 C) 98.6 F (37 C)  Resp: 16 16   patient appears in NAD  Impression: Stable   Plan: 1.  No changes, waiting for culture growth to see if fluoroquinonlone sensitive and could use oral therapy. Otherwise I will order a picc and use IV.  Seems to be improving with cefazolin.

## 2015-07-23 ENCOUNTER — Other Ambulatory Visit: Payer: Self-pay | Admitting: Orthopedic Surgery

## 2015-07-23 DIAGNOSIS — T8140XD Infection following a procedure, unspecified, subsequent encounter: Secondary | ICD-10-CM

## 2015-07-23 MED ORDER — DEXTROSE 5 % IV SOLN: 2.0000 g | INTRAVENOUS | Status: DC

## 2015-07-23 MED ORDER — HEPARIN SOD (PORK) LOCK FLUSH 100 UNIT/ML IV SOLN
250.0000 [IU] | INTRAVENOUS | Status: AC | PRN
  Administered 2015-07-23: 250 [IU]

## 2015-07-23 NOTE — Discharge Instructions (Signed)
Continue long leg splint and dressings until follow up in the office in 2 weeks.  Keep clean and dry. Continue Aspirin 325mg  daily. Continue antibiotics as recommended by infectious disease:  Ceftriaxone 2 grams daily for 6 weeks through July 18.  Weekly CBC / CMP. Then convert to oral continuation therapy to bactrim for 3-6 months.   Diet: As you were doing prior to hospitalization    Dressing:  you have a splint, just leave the splint in place and we will change your bandages during your first follow-up appointment.     Activity:  Increase activity slowly as tolerated, but follow the weight bearing instructions below.  The rules on driving is that you can not be taking narcotics while you drive, and you must feel in control of the vehicle.    Weight Bearing:  Touch down weight bearing left leg.    To prevent constipation: you may use a stool softener such as -  Colace (over the counter) 100 mg by mouth twice a day  Drink plenty of fluids (prune juice may be helpful) and high fiber foods Miralax (over the counter) for constipation as needed.    Itching:  If you experience itching with your medications, try taking only a single pain pill, or even half a pain pill at a time.  You may take up to 10 pain pills per day, and you can also use benadryl over the counter for itching or also to help with sleep.   Precautions:  If you experience chest pain or shortness of breath - call 911 immediately for transfer to the hospital emergency department!!  If you develop a fever greater that 101 F, purulent drainage from wound, increased redness or drainage from wound, or calf pain -- Call the office at [phone]                                                Follow- Up Appointment:  Please call for an appointment to be seen in 2 weeks [place] - [phone]

## 2015-07-23 NOTE — Care Management Important Message (Signed)
Important Message  Patient Details  Name: [name] MRN: [id number] Date of Birth: 10/18/1955   Medicare Important Message Given:  Yes    Bernadette HoitShoffner, Kaicen Desena Coleman 07/23/2015, 10:20 AM
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
## 2015-07-23 NOTE — Clinical Social Work Placement (Signed)
   CLINICAL SOCIAL WORK PLACEMENT  NOTE  Date:  07/23/2015  Patient Details  Name: [name] MRN: [id number] Date of Birth: 04/25/1955  Clinical Social Work is seeking post-discharge placement for this patient at the Skilled  Nursing Facility level of care (*CSW will initial, date and re-position this form in  chart as items are completed):  Yes   Patient/family provided with [facility] Clinical Social Work Department's list of facilities offering this level of care within the geographic area requested by the patient (or if unable, by the patient's family).  Yes   Patient/family informed of their freedom to choose among providers that offer the needed level of care, that participate in Medicare, Medicaid or managed care program needed by the patient, have an available bed and are willing to accept the patient.  Yes   Patient/family informed of [facility]'s ownership interest in [facility] and [facility], as well as of the fact that they are under no obligation to receive care at these facilities.  PASRR submitted to EDS on 07/23/15     PASRR number received on 07/23/15     Existing PASRR number confirmed on       FL2 transmitted to all facilities in geographic area requested by pt/family on 07/23/15     FL2 transmitted to all facilities within larger geographic area on       Patient informed that his/her managed care company has contracts with or will negotiate with certain facilities, including the following:        Yes   Patient/family informed of bed offers received.  Patient chooses bed at [facility]     Physician recommends and patient chooses bed at      Patient to be [facility] on 07/23/15.  Patient to be transferred to facility by [facility]     Patient family notified on 07/23/15 of transfer.  Name of family member notified:  Patient     PHYSICIAN       Additional Comment:     _______________________________________________ Rod MaeVaughn, Shanira Tine S, LCSW 07/23/2015, 10:16 AM

## 2015-07-23 NOTE — Clinical Social Work Note (Signed)
Patient to be discharged to [facility]. Patient updated regarding discharge. Patient to be transported via EMS.  RN report number: [id number]  Dreya Buhrman, LCSW [phone] Orthopedics: [id number] Surgical: [id number]   

## 2015-07-24 ENCOUNTER — Encounter: Payer: Self-pay | Admitting: Nurse Practitioner

## 2015-07-24 ENCOUNTER — Non-Acute Institutional Stay (SKILLED_NURSING_FACILITY): Payer: Medicare Other | Admitting: Nurse Practitioner

## 2015-07-24 DIAGNOSIS — T8450XD Infection and inflammatory reaction due to unspecified internal joint prosthesis, subsequent encounter: Secondary | ICD-10-CM | POA: Diagnosis not present

## 2015-07-24 DIAGNOSIS — J45909 Unspecified asthma, uncomplicated: Secondary | ICD-10-CM

## 2015-07-24 DIAGNOSIS — M1712 Unilateral primary osteoarthritis, left knee: Secondary | ICD-10-CM | POA: Diagnosis not present

## 2015-07-24 DIAGNOSIS — J449 Chronic obstructive pulmonary disease, unspecified: Secondary | ICD-10-CM

## 2015-07-24 DIAGNOSIS — T8450XA Infection and inflammatory reaction due to unspecified internal joint prosthesis, initial encounter: Secondary | ICD-10-CM | POA: Insufficient documentation

## 2015-07-24 DIAGNOSIS — S86812D Strain of other muscle(s) and tendon(s) at lower leg level, left leg, subsequent encounter: Secondary | ICD-10-CM

## 2015-07-24 DIAGNOSIS — I10 Essential (primary) hypertension: Secondary | ICD-10-CM

## 2015-07-24 DIAGNOSIS — J4489 Other specified chronic obstructive pulmonary disease: Secondary | ICD-10-CM
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
## 2015-07-25 LAB — AEROBIC/ANAEROBIC CULTURE (SURGICAL/DEEP WOUND)

## 2015-07-25 LAB — AEROBIC/ANAEROBIC CULTURE W GRAM STAIN (SURGICAL/DEEP WOUND)

## 2015-07-27 ENCOUNTER — Encounter: Payer: Self-pay | Admitting: Internal Medicine

## 2015-07-27 ENCOUNTER — Non-Acute Institutional Stay (SKILLED_NURSING_FACILITY): Payer: Medicare Other | Admitting: Internal Medicine

## 2015-07-27 DIAGNOSIS — M79671 Pain in right foot: Secondary | ICD-10-CM

## 2015-07-27 DIAGNOSIS — R202 Paresthesia of skin: Secondary | ICD-10-CM

## 2015-07-27 DIAGNOSIS — K219 Gastro-esophageal reflux disease without esophagitis: Secondary | ICD-10-CM

## 2015-07-27 DIAGNOSIS — J45909 Unspecified asthma, uncomplicated: Secondary | ICD-10-CM

## 2015-07-27 DIAGNOSIS — D649 Anemia, unspecified: Secondary | ICD-10-CM | POA: Diagnosis not present

## 2015-07-27 DIAGNOSIS — Z9889 Other specified postprocedural states: Secondary | ICD-10-CM | POA: Diagnosis not present

## 2015-07-27 DIAGNOSIS — T8450XD Infection and inflammatory reaction due to unspecified internal joint prosthesis, subsequent encounter: Secondary | ICD-10-CM

## 2015-07-27 DIAGNOSIS — J449 Chronic obstructive pulmonary disease, unspecified: Secondary | ICD-10-CM | POA: Diagnosis not present

## 2015-07-27 DIAGNOSIS — G894 Chronic pain syndrome: Secondary | ICD-10-CM

## 2015-07-27 DIAGNOSIS — N529 Male erectile dysfunction, unspecified: Secondary | ICD-10-CM | POA: Diagnosis not present

## 2015-07-27 DIAGNOSIS — G8929 Other chronic pain: Secondary | ICD-10-CM

## 2015-07-27 DIAGNOSIS — M545 Low back pain: Secondary | ICD-10-CM | POA: Diagnosis not present

## 2015-07-27 DIAGNOSIS — M79672 Pain in left foot: Secondary | ICD-10-CM | POA: Diagnosis not present

## 2015-07-27 DIAGNOSIS — I10 Essential (primary) hypertension: Secondary | ICD-10-CM
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
## 2015-07-28 HISTORY — PX: IRRIGATION AND DEBRIDEMENT KNEE: SHX5185

## 2015-07-29 ENCOUNTER — Encounter (HOSPITAL_COMMUNITY): Payer: Self-pay | Admitting: General Practice

## 2015-07-29 ENCOUNTER — Inpatient Hospital Stay (HOSPITAL_COMMUNITY)
Admission: AD | Admit: 2015-07-29 | Discharge: 2015-08-12 | DRG: 464 | Disposition: A | Discharge status: Home Health | Payer: Medicare Other | Source: Ambulatory Visit | Attending: Orthopedic Surgery | Admitting: Orthopedic Surgery

## 2015-07-29 DIAGNOSIS — E559 Vitamin D deficiency, unspecified: Secondary | ICD-10-CM | POA: Diagnosis present

## 2015-07-29 DIAGNOSIS — Z7982 Long term (current) use of aspirin: Secondary | ICD-10-CM

## 2015-07-29 DIAGNOSIS — D62 Acute posthemorrhagic anemia: Secondary | ICD-10-CM | POA: Diagnosis not present

## 2015-07-29 DIAGNOSIS — A499 Bacterial infection, unspecified: Secondary | ICD-10-CM | POA: Insufficient documentation

## 2015-07-29 DIAGNOSIS — T8454XD Infection and inflammatory reaction due to internal left knee prosthesis, subsequent encounter: Secondary | ICD-10-CM | POA: Diagnosis not present

## 2015-07-29 DIAGNOSIS — Z720 Tobacco use: Secondary | ICD-10-CM | POA: Diagnosis present

## 2015-07-29 DIAGNOSIS — F17210 Nicotine dependence, cigarettes, uncomplicated: Secondary | ICD-10-CM | POA: Diagnosis present

## 2015-07-29 DIAGNOSIS — Z96652 Presence of left artificial knee joint: Secondary | ICD-10-CM | POA: Diagnosis present

## 2015-07-29 DIAGNOSIS — A498 Other bacterial infections of unspecified site: Secondary | ICD-10-CM | POA: Diagnosis not present

## 2015-07-29 DIAGNOSIS — B9689 Other specified bacterial agents as the cause of diseases classified elsewhere: Secondary | ICD-10-CM | POA: Diagnosis present

## 2015-07-29 DIAGNOSIS — M545 Low back pain: Secondary | ICD-10-CM | POA: Diagnosis present

## 2015-07-29 DIAGNOSIS — T8131XS Disruption of external operation (surgical) wound, not elsewhere classified, sequela: Secondary | ICD-10-CM | POA: Diagnosis not present

## 2015-07-29 DIAGNOSIS — T8131XA Disruption of external operation (surgical) wound, not elsewhere classified, initial encounter: Secondary | ICD-10-CM | POA: Diagnosis present

## 2015-07-29 DIAGNOSIS — Z79899 Other long term (current) drug therapy: Secondary | ICD-10-CM | POA: Diagnosis not present

## 2015-07-29 DIAGNOSIS — S76112A Strain of left quadriceps muscle, fascia and tendon, initial encounter: Secondary | ICD-10-CM | POA: Diagnosis present

## 2015-07-29 DIAGNOSIS — K219 Gastro-esophageal reflux disease without esophagitis: Secondary | ICD-10-CM | POA: Diagnosis present

## 2015-07-29 DIAGNOSIS — B952 Enterococcus as the cause of diseases classified elsewhere: Secondary | ICD-10-CM | POA: Diagnosis present

## 2015-07-29 DIAGNOSIS — Z79891 Long term (current) use of opiate analgesic: Secondary | ICD-10-CM

## 2015-07-29 DIAGNOSIS — J449 Chronic obstructive pulmonary disease, unspecified: Secondary | ICD-10-CM | POA: Diagnosis present

## 2015-07-29 DIAGNOSIS — T8781 Dehiscence of amputation stump: Secondary | ICD-10-CM | POA: Diagnosis present

## 2015-07-29 DIAGNOSIS — M00862 Arthritis due to other bacteria, left knee: Secondary | ICD-10-CM | POA: Diagnosis not present

## 2015-07-29 DIAGNOSIS — T8454XA Infection and inflammatory reaction due to internal left knee prosthesis, initial encounter: Secondary | ICD-10-CM | POA: Diagnosis present

## 2015-07-29 DIAGNOSIS — T8450XS Infection and inflammatory reaction due to unspecified internal joint prosthesis, sequela: Secondary | ICD-10-CM | POA: Diagnosis not present

## 2015-07-29 DIAGNOSIS — Z87820 Personal history of traumatic brain injury: Secondary | ICD-10-CM

## 2015-07-29 DIAGNOSIS — I10 Essential (primary) hypertension: Secondary | ICD-10-CM | POA: Diagnosis present

## 2015-07-29 DIAGNOSIS — S86819A Strain of other muscle(s) and tendon(s) at lower leg level, unspecified leg, initial encounter: Secondary | ICD-10-CM | POA: Diagnosis present

## 2015-07-29 DIAGNOSIS — S86812A Strain of other muscle(s) and tendon(s) at lower leg level, left leg, initial encounter: Secondary | ICD-10-CM | POA: Diagnosis present

## 2015-07-29 DIAGNOSIS — M1712 Unilateral primary osteoarthritis, left knee: Secondary | ICD-10-CM | POA: Diagnosis present

## 2015-07-29 DIAGNOSIS — T8450XA Infection and inflammatory reaction due to unspecified internal joint prosthesis, initial encounter: Secondary | ICD-10-CM | POA: Diagnosis present

## 2015-07-29 DIAGNOSIS — J4489 Other specified chronic obstructive pulmonary disease: Secondary | ICD-10-CM | POA: Diagnosis present

## 2015-07-29 LAB — BASIC METABOLIC PANEL
Anion gap: 8 (ref 5–15)
BUN: 13 mg/dL (ref 6–20)
CO2: 27 mmol/L (ref 22–32)
Calcium: 9.5 mg/dL (ref 8.9–10.3)
Chloride: 96 mmol/L — ABNORMAL LOW (ref 101–111)
Creatinine, Ser: 0.7 mg/dL (ref 0.61–1.24)
GFR calc Af Amer: >60 mL/min (ref >60)
GFR calc non Af Amer: >60 mL/min (ref >60)
Glucose, Bld: 108 mg/dL — ABNORMAL HIGH (ref 65–99)
Potassium: 5 mmol/L (ref 3.5–5.1)
Sodium: 131 mmol/L — ABNORMAL LOW (ref 135–145)

## 2015-07-29 MED ORDER — LISINOPRIL 20 MG PO TABS
20.0000 mg | ORAL_TABLET | ORAL | Status: DC
  Administered 2015-07-31 – 2015-08-12 (×13): 20 mg via ORAL
  Filled 2015-07-29 (×13): qty 1

## 2015-07-29 MED ORDER — DEXTROSE-NACL 5-0.45 % IV SOLN: 100.0000 mL/h | INTRAVENOUS | Status: DC

## 2015-07-29 MED ORDER — ACETAMINOPHEN 500 MG PO TABS: 1000.0000 mg | ORAL_TABLET | Freq: Once | ORAL | Status: DC

## 2015-07-29 MED ORDER — METHOCARBAMOL 1000 MG/10ML IJ SOLN
500.0000 mg | Freq: Four times a day (QID) | INTRAVENOUS | Status: DC | PRN
  Filled 2015-07-29: qty 5

## 2015-07-29 MED ORDER — DOCUSATE SODIUM 100 MG PO CAPS
100.0000 mg | ORAL_CAPSULE | Freq: Two times a day (BID) | ORAL | Status: DC
  Administered 2015-07-30 – 2015-08-12 (×27): 100 mg via ORAL
  Filled 2015-07-29 (×28): qty 1

## 2015-07-29 MED ORDER — DIPHENHYDRAMINE HCL 12.5 MG/5ML PO ELIX: 12.5000 mg | ORAL_SOLUTION | ORAL | Status: DC | PRN

## 2015-07-29 MED ORDER — ACETAMINOPHEN 325 MG PO TABS: 650.0000 mg | ORAL_TABLET | Freq: Four times a day (QID) | ORAL | Status: DC | PRN

## 2015-07-29 MED ORDER — HYDROCODONE-ACETAMINOPHEN 5-325 MG PO TABS: 1.0000 | ORAL_TABLET | ORAL | Status: DC | PRN

## 2015-07-29 MED ORDER — CEFAZOLIN SODIUM-DEXTROSE 2-4 GM/100ML-% IV SOLN: 2.0000 g | INTRAVENOUS | Status: DC

## 2015-07-29 MED ORDER — LACTATED RINGERS IV SOLN
INTRAVENOUS | Status: DC
  Administered 2015-07-30 (×4): via INTRAVENOUS

## 2015-07-29 MED ORDER — PANTOPRAZOLE SODIUM 20 MG PO TBEC
40.0000 mg | DELAYED_RELEASE_TABLET | Freq: Every day | ORAL | Status: DC
  Filled 2015-07-29: qty 2

## 2015-07-29 MED ORDER — METHOCARBAMOL 500 MG PO TABS
500.0000 mg | ORAL_TABLET | Freq: Four times a day (QID) | ORAL | Status: DC | PRN
  Administered 2015-07-29 – 2015-08-11 (×27): 500 mg via ORAL
  Filled 2015-07-29 (×27): qty 1

## 2015-07-29 MED ORDER — PANTOPRAZOLE SODIUM 40 MG PO TBEC
40.0000 mg | DELAYED_RELEASE_TABLET | Freq: Every day | ORAL | Status: DC
  Administered 2015-07-31 – 2015-08-12 (×13): 40 mg via ORAL
  Filled 2015-07-29 (×14): qty 1

## 2015-07-29 MED ORDER — DEXTROSE 5 % IV SOLN
2.0000 g | INTRAVENOUS | Status: DC
  Administered 2015-07-30 – 2015-08-01 (×3): 2 g via INTRAVENOUS
  Filled 2015-07-29 (×5): qty 2

## 2015-07-29 MED ORDER — SODIUM CHLORIDE 0.9% FLUSH
10.0000 mL | INTRAVENOUS | Status: DC | PRN
  Administered 2015-07-30 – 2015-08-12 (×4): 10 mL
  Filled 2015-07-29 (×4): qty 40

## 2015-07-29 MED ORDER — MORPHINE SULFATE (PF) 4 MG/ML IV SOLN
2.0000 mg | INTRAVENOUS | Status: DC | PRN
  Administered 2015-07-30 (×2): 2 mg via INTRAVENOUS
  Filled 2015-07-29 (×2): qty 1

## 2015-07-29 MED ORDER — ASPIRIN EC 325 MG PO TBEC: 325.0000 mg | DELAYED_RELEASE_TABLET | Freq: Every day | ORAL | Status: DC

## 2015-07-29 MED ORDER — DOCUSATE SODIUM 50 MG PO CAPS
100.0000 mg | ORAL_CAPSULE | Freq: Two times a day (BID) | ORAL | Status: DC
  Filled 2015-07-29 (×2): qty 2

## 2015-07-29 MED ORDER — OXYCODONE-ACETAMINOPHEN 5-325 MG PO TABS
1.0000 | ORAL_TABLET | ORAL | Status: DC | PRN
  Administered 2015-07-29 – 2015-07-30 (×3): 2 via ORAL
  Filled 2015-07-29 (×3): qty 2

## 2015-07-29 MED ORDER — ACETAMINOPHEN 650 MG RE SUPP: 650.0000 mg | Freq: Four times a day (QID) | RECTAL | Status: DC | PRN

## 2015-07-29 NOTE — Progress Notes (Signed)
Unable to contact pt, unable to leave voice message with instructions. Spoke with relative ( emergency contact number) brother stated that pt moved to [place] and he would try to contact pt spouse and provide pt with my phone number. Pt has not returned call.
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
## 2015-07-30 ENCOUNTER — Inpatient Hospital Stay (HOSPITAL_COMMUNITY): Payer: Medicare Other | Admitting: Certified Registered"

## 2015-07-30 ENCOUNTER — Encounter (HOSPITAL_COMMUNITY)
Admission: AD | Disposition: A | Discharge status: Home Health | Payer: Self-pay | Source: Ambulatory Visit | Attending: Orthopedic Surgery

## 2015-07-30 ENCOUNTER — Inpatient Hospital Stay (HOSPITAL_COMMUNITY): Admission: RE | Admit: 2015-07-30 | Payer: Medicare Other | Source: Ambulatory Visit | Admitting: Orthopedic Surgery

## 2015-07-30 DIAGNOSIS — T8131XA Disruption of external operation (surgical) wound, not elsewhere classified, initial encounter: Secondary | ICD-10-CM | POA: Diagnosis present

## 2015-07-30 HISTORY — PX: INCISION AND DRAINAGE: SHX5863

## 2015-07-30 HISTORY — PX: EXCISIONAL TOTAL KNEE ARTHROPLASTY WITH ANTIBIOTIC SPACERS: SHX5827

## 2015-07-30 LAB — SURGICAL PCR SCREEN
MRSA, PCR: NEGATIVE
Staphylococcus aureus: NEGATIVE

## 2015-07-30 LAB — CBC
HCT: 30.4 % — ABNORMAL LOW (ref 39.0–52.0)
Hemoglobin: 9.8 g/dL — ABNORMAL LOW (ref 13.0–17.0)
MCH: 30.5 pg (ref 26.0–34.0)
MCHC: 32.2 g/dL (ref 30.0–36.0)
MCV: 94.7 fL (ref 78.0–100.0)
Platelets: 483 10*3/uL — ABNORMAL HIGH (ref 150–400)
RBC: 3.21 MIL/uL — ABNORMAL LOW (ref 4.22–5.81)
RDW: 14.4 % (ref 11.5–15.5)
WBC: 14.6 10*3/uL — ABNORMAL HIGH (ref 4.0–10.5)

## 2015-07-30 LAB — ABO/RH: ABO/RH(D): AB POS

## 2015-07-30 LAB — PREPARE RBC (CROSSMATCH)

## 2015-07-30 SURGERY — INCISION AND DRAINAGE
Anesthesia: General | Site: Knee | Laterality: Left

## 2015-07-30 MED ORDER — METOCLOPRAMIDE HCL 5 MG/ML IJ SOLN: 5.0000 mg | Freq: Three times a day (TID) | INTRAMUSCULAR | Status: DC | PRN

## 2015-07-30 MED ORDER — SODIUM CHLORIDE 0.9 % IV SOLN: Freq: Once | INTRAVENOUS | Status: DC

## 2015-07-30 MED ORDER — CEFAZOLIN SODIUM-DEXTROSE 2-3 GM-% IV SOLR
INTRAVENOUS | Status: DC | PRN
  Administered 2015-07-30: 2 g via INTRAVENOUS

## 2015-07-30 MED ORDER — OXYCHLOROSENE SODIUM POWD
Freq: Once | Status: DC
  Filled 2015-07-30: qty 2

## 2015-07-30 MED ORDER — MENTHOL 3 MG MT LOZG: 1.0000 | LOZENGE | OROMUCOSAL | Status: DC | PRN

## 2015-07-30 MED ORDER — SODIUM CHLORIDE 0.9 % IV SOLN
INTRAVENOUS | Status: DC
  Administered 2015-07-30: 18:00:00 via INTRAVENOUS

## 2015-07-30 MED ORDER — ALBUTEROL SULFATE (2.5 MG/3ML) 0.083% IN NEBU: 2.5000 mg | INHALATION_SOLUTION | Freq: Four times a day (QID) | RESPIRATORY_TRACT | Status: DC | PRN

## 2015-07-30 MED ORDER — HYDROMORPHONE HCL 1 MG/ML IJ SOLN
INTRAMUSCULAR | Status: DC | PRN
  Administered 2015-07-30 (×4): .2 mg via INTRAVENOUS
  Administered 2015-07-30: .3 mg via INTRAVENOUS
  Administered 2015-07-30 (×4): .2 mg via INTRAVENOUS
  Administered 2015-07-30: .1 mg via INTRAVENOUS

## 2015-07-30 MED ORDER — PHENYLEPHRINE HCL 10 MG/ML IJ SOLN
INTRAMUSCULAR | Status: DC | PRN
  Administered 2015-07-30 (×2): 120 ug via INTRAVENOUS
  Administered 2015-07-30 (×2): 80 ug via INTRAVENOUS

## 2015-07-30 MED ORDER — ONDANSETRON HCL 4 MG PO TABS: 4.0000 mg | ORAL_TABLET | Freq: Four times a day (QID) | ORAL | Status: DC | PRN

## 2015-07-30 MED ORDER — FENTANYL CITRATE (PF) 250 MCG/5ML IJ SOLN
INTRAMUSCULAR | Status: AC
  Filled 2015-07-30: qty 5

## 2015-07-30 MED ORDER — ONDANSETRON HCL 4 MG/2ML IJ SOLN
INTRAMUSCULAR | Status: AC
  Filled 2015-07-30: qty 2

## 2015-07-30 MED ORDER — CHLORHEXIDINE GLUCONATE 4 % EX LIQD
60.0000 mL | Freq: Once | CUTANEOUS | Status: DC
  Filled 2015-07-30: qty 60

## 2015-07-30 MED ORDER — ACETAMINOPHEN 325 MG PO TABS: 325.0000 mg | ORAL_TABLET | ORAL | Status: DC | PRN

## 2015-07-30 MED ORDER — DEXAMETHASONE SODIUM PHOSPHATE 10 MG/ML IJ SOLN
INTRAMUSCULAR | Status: AC
  Filled 2015-07-30: qty 1

## 2015-07-30 MED ORDER — CEFAZOLIN SODIUM 1 G IJ SOLR
INTRAMUSCULAR | Status: AC
  Filled 2015-07-30: qty 10

## 2015-07-30 MED ORDER — PHENOL 1.4 % MT LIQD: 1.0000 | OROMUCOSAL | Status: DC | PRN

## 2015-07-30 MED ORDER — OXYCODONE-ACETAMINOPHEN 7.5-325 MG PO TABS
1.0000 | ORAL_TABLET | ORAL | Status: DC | PRN
  Administered 2015-07-30 – 2015-07-31 (×2): 2 via ORAL
  Administered 2015-07-31: 1 via ORAL
  Administered 2015-07-31 – 2015-08-01 (×5): 2 via ORAL
  Administered 2015-08-01: 1 via ORAL
  Administered 2015-08-01: 2 via ORAL
  Administered 2015-08-02: 1 via ORAL
  Administered 2015-08-02 – 2015-08-04 (×7): 2 via ORAL
  Administered 2015-08-04: 1 via ORAL
  Administered 2015-08-04 – 2015-08-10 (×28): 2 via ORAL
  Filled 2015-07-30 (×38): qty 2
  Filled 2015-07-30: qty 1
  Filled 2015-07-30 (×7): qty 2
  Filled 2015-07-30: qty 1

## 2015-07-30 MED ORDER — HYDROMORPHONE HCL 1 MG/ML IJ SOLN
INTRAMUSCULAR | Status: AC
  Filled 2015-07-30: qty 1

## 2015-07-30 MED ORDER — STERILE WATER FOR INJECTION IJ SOLN: 1.0000 "application " | Freq: Once | Status: DC

## 2015-07-30 MED ORDER — ALBUTEROL SULFATE (2.5 MG/3ML) 0.083% IN NEBU
2.5000 mg | INHALATION_SOLUTION | Freq: Four times a day (QID) | RESPIRATORY_TRACT | Status: DC
  Administered 2015-07-30: 2.5 mg via RESPIRATORY_TRACT
  Filled 2015-07-30: qty 3

## 2015-07-30 MED ORDER — SODIUM CHLORIDE 0.9 % IR SOLN
Status: DC | PRN
  Administered 2015-07-30: 3000 mL

## 2015-07-30 MED ORDER — METOCLOPRAMIDE HCL 5 MG PO TABS: 5.0000 mg | ORAL_TABLET | Freq: Three times a day (TID) | ORAL | Status: DC | PRN

## 2015-07-30 MED ORDER — HYDROMORPHONE HCL 1 MG/ML IJ SOLN
0.5000 mg | INTRAMUSCULAR | Status: DC | PRN
Start: 2015-07-30 — End: 2015-08-04
  Administered 2015-07-31: 0.5 mg via INTRAVENOUS
  Administered 2015-07-31 – 2015-08-04 (×6): 1 mg via INTRAVENOUS
  Filled 2015-07-30 (×7): qty 1

## 2015-07-30 MED ORDER — DEXAMETHASONE SODIUM PHOSPHATE 10 MG/ML IJ SOLN
10.0000 mg | Freq: Once | INTRAMUSCULAR | Status: AC
  Administered 2015-07-31: 10 mg via INTRAVENOUS
  Filled 2015-07-30: qty 1

## 2015-07-30 MED ORDER — ACETAMINOPHEN 325 MG PO TABS
650.0000 mg | ORAL_TABLET | Freq: Four times a day (QID) | ORAL | Status: DC | PRN
  Administered 2015-07-30: 650 mg via ORAL
  Filled 2015-07-30: qty 2

## 2015-07-30 MED ORDER — ONDANSETRON HCL 4 MG/2ML IJ SOLN
4.0000 mg | Freq: Four times a day (QID) | INTRAMUSCULAR | Status: DC | PRN
  Filled 2015-07-30: qty 2

## 2015-07-30 MED ORDER — OXYCODONE HCL 5 MG PO TABS: 5.0000 mg | ORAL_TABLET | Freq: Once | ORAL | Status: DC | PRN

## 2015-07-30 MED ORDER — MIDAZOLAM HCL 2 MG/2ML IJ SOLN
INTRAMUSCULAR | Status: AC
  Filled 2015-07-30: qty 2

## 2015-07-30 MED ORDER — FLEET ENEMA 7-19 GM/118ML RE ENEM
1.0000 | ENEMA | Freq: Once | RECTAL | Status: DC | PRN
Start: 2015-07-30 — End: 2015-08-12

## 2015-07-30 MED ORDER — PROPOFOL 10 MG/ML IV BOLUS
INTRAVENOUS | Status: DC | PRN
Start: 2015-07-30 — End: 2015-07-30
  Administered 2015-07-30: 140 mg via INTRAVENOUS

## 2015-07-30 MED ORDER — TRANEXAMIC ACID 1000 MG/10ML IV SOLN
2000.0000 mg | INTRAVENOUS | Status: DC | PRN
  Administered 2015-07-30: 2000 mg via INTRAVENOUS

## 2015-07-30 MED ORDER — POLYETHYLENE GLYCOL 3350 17 G PO PACK
17.0000 g | PACK | Freq: Every day | ORAL | Status: DC | PRN
  Administered 2015-08-11: 17 g via ORAL
  Filled 2015-07-30: qty 1

## 2015-07-30 MED ORDER — ONDANSETRON HCL 4 MG/2ML IJ SOLN
INTRAMUSCULAR | Status: DC | PRN
  Administered 2015-07-30: 4 mg via INTRAVENOUS

## 2015-07-30 MED ORDER — KETOROLAC TROMETHAMINE 15 MG/ML IJ SOLN
15.0000 mg | Freq: Three times a day (TID) | INTRAMUSCULAR | Status: AC
Start: 2015-07-30 — End: 2015-08-04
  Administered 2015-07-30 – 2015-08-04 (×15): 15 mg via INTRAVENOUS
  Filled 2015-07-30 (×15): qty 1

## 2015-07-30 MED ORDER — FENTANYL CITRATE (PF) 100 MCG/2ML IJ SOLN
INTRAMUSCULAR | Status: DC | PRN
  Administered 2015-07-30 (×2): 50 ug via INTRAVENOUS
  Administered 2015-07-30: 25 ug via INTRAVENOUS
  Administered 2015-07-30: 50 ug via INTRAVENOUS
  Administered 2015-07-30 (×3): 25 ug via INTRAVENOUS

## 2015-07-30 MED ORDER — DEXAMETHASONE SODIUM PHOSPHATE 10 MG/ML IJ SOLN
INTRAMUSCULAR | Status: DC | PRN
  Administered 2015-07-30: 5 mg via INTRAVENOUS

## 2015-07-30 MED ORDER — BISACODYL 10 MG RE SUPP: 10.0000 mg | Freq: Every day | RECTAL | Status: DC | PRN

## 2015-07-30 MED ORDER — ACETAMINOPHEN 650 MG RE SUPP: 650.0000 mg | Freq: Four times a day (QID) | RECTAL | Status: DC | PRN

## 2015-07-30 MED ORDER — TRANEXAMIC ACID 1000 MG/10ML IV SOLN
1000.0000 mg | INTRAVENOUS | Status: AC
  Administered 2015-07-30: 1000 mg via INTRAVENOUS
  Filled 2015-07-30: qty 10

## 2015-07-30 MED ORDER — SODIUM CHLORIDE 0.9 % IV SOLN
2000.0000 mg | Freq: Once | INTRAVENOUS | Status: DC
  Filled 2015-07-30: qty 20

## 2015-07-30 MED ORDER — FENTANYL CITRATE (PF) 100 MCG/2ML IJ SOLN
25.0000 ug | INTRAMUSCULAR | Status: DC | PRN
  Administered 2015-07-30: 50 ug via INTRAVENOUS
  Administered 2015-07-30 (×2): 25 ug via INTRAVENOUS

## 2015-07-30 MED ORDER — OXYCHLOROSENE SODIUM POWD
Status: DC | PRN
  Administered 2015-07-30: 2

## 2015-07-30 MED ORDER — OXYCODONE HCL 5 MG/5ML PO SOLN: 5.0000 mg | Freq: Once | ORAL | Status: DC | PRN

## 2015-07-30 MED ORDER — MORPHINE SULFATE (PF) 2 MG/ML IV SOLN
2.0000 mg | INTRAVENOUS | Status: DC | PRN
  Administered 2015-07-30 (×3): 2 mg via INTRAVENOUS
  Filled 2015-07-30 (×3): qty 1

## 2015-07-30 MED ORDER — LIDOCAINE 2% (20 MG/ML) 5 ML SYRINGE
INTRAMUSCULAR | Status: AC
  Filled 2015-07-30: qty 5

## 2015-07-30 MED ORDER — MIDAZOLAM HCL 5 MG/5ML IJ SOLN
INTRAMUSCULAR | Status: DC | PRN
  Administered 2015-07-30: 2 mg via INTRAVENOUS

## 2015-07-30 MED ORDER — ASPIRIN EC 325 MG PO TBEC
325.0000 mg | DELAYED_RELEASE_TABLET | Freq: Every day | ORAL | Status: DC
  Administered 2015-07-31 – 2015-08-12 (×13): 325 mg via ORAL
  Filled 2015-07-30 (×13): qty 1

## 2015-07-30 MED ORDER — POVIDONE-IODINE 10 % EX SWAB: 2.0000 "application " | Freq: Once | CUTANEOUS | Status: DC

## 2015-07-30 MED ORDER — ACETAMINOPHEN 160 MG/5ML PO SOLN: 325.0000 mg | ORAL | Status: DC | PRN

## 2015-07-30 MED ORDER — OXYCODONE HCL 5 MG PO TABS
5.0000 mg | ORAL_TABLET | ORAL | Status: DC | PRN
  Administered 2015-07-30 (×2): 10 mg via ORAL
  Filled 2015-07-30 (×2): qty 2

## 2015-07-30 SURGICAL SUPPLY — 59 items
BANDAGE ESMARK 6X9 LF (GAUZE/BANDAGES/DRESSINGS) ×2 IMPLANT
BLADE LONG MED 31X9 (MISCELLANEOUS) ×6 IMPLANT
BNDG ELASTIC 6X15 VLCR STRL LF (GAUZE/BANDAGES/DRESSINGS) ×3 IMPLANT
BNDG ESMARK 6X9 LF (GAUZE/BANDAGES/DRESSINGS) ×3
BONE CEMENT SIMPLEX TOBRAMYCIN (Cement) ×2 IMPLANT
BOWL SMART MIX CTS (DISPOSABLE) IMPLANT
CEMENT BONE SIMPLEX TOBRAMYCIN (Cement) ×4 IMPLANT
CHLORAPREP W/TINT 26ML (MISCELLANEOUS) IMPLANT
CLSR STERI-STRIP ANTIMIC 1/2X4 (GAUZE/BANDAGES/DRESSINGS) IMPLANT
COMPONENT TIBIAL LRG (Knees) ×2 IMPLANT
COVER SURGICAL LIGHT HANDLE (MISCELLANEOUS) ×3 IMPLANT
CUFF TOURNIQUET SINGLE 34IN LL (TOURNIQUET CUFF) ×3 IMPLANT
DRAPE ARTHROSCOPY W/POUCH 114 (DRAPES) ×3 IMPLANT
DRAPE IMP U-DRAPE 54X76 (DRAPES) ×6 IMPLANT
DRAPE PROXIMA HALF (DRAPES) ×3 IMPLANT
DRAPE U-SHAPE 47X51 STRL (DRAPES) ×3 IMPLANT
DRSG MEPILEX BORDER 4X4 (GAUZE/BANDAGES/DRESSINGS) IMPLANT
DRSG MEPILEX BORDER 4X8 (GAUZE/BANDAGES/DRESSINGS) ×3 IMPLANT
DRSG VAC ATS MED SENSATRAC (GAUZE/BANDAGES/DRESSINGS) ×3 IMPLANT
ELECT CAUTERY BLADE 6.4 (BLADE) ×3 IMPLANT
ELECT REM PT RETURN 9FT ADLT (ELECTROSURGICAL) ×3
ELECTRODE REM PT RTRN 9FT ADLT (ELECTROSURGICAL) ×2 IMPLANT
EVACUATOR 1/8 PVC DRAIN (DRAIN) IMPLANT
FACESHIELD WRAPAROUND (MASK) ×9 IMPLANT
FEMORAL COMP LRG REMEDY (Knees) ×3 IMPLANT
GLOVE BIO SURGEON STRL SZ7 (GLOVE) ×3 IMPLANT
GLOVE BIO SURGEON STRL SZ7.5 (GLOVE) ×3 IMPLANT
GLOVE BIOGEL PI IND STRL 7.0 (GLOVE) ×2 IMPLANT
GLOVE BIOGEL PI IND STRL 8 (GLOVE) ×2 IMPLANT
GLOVE BIOGEL PI INDICATOR 7.0 (GLOVE) ×1
GLOVE BIOGEL PI INDICATOR 8 (GLOVE) ×1
GOWN STRL REUS W/ TWL LRG LVL3 (GOWN DISPOSABLE) ×2 IMPLANT
GOWN STRL REUS W/ TWL XL LVL3 (GOWN DISPOSABLE) ×2 IMPLANT
GOWN STRL REUS W/TWL LRG LVL3 (GOWN DISPOSABLE) ×1
GOWN STRL REUS W/TWL XL LVL3 (GOWN DISPOSABLE) ×1
HANDPIECE INTERPULSE COAX TIP (DISPOSABLE) ×1
IMMOBILIZER KNEE 22 UNIV (SOFTGOODS) IMPLANT
KIT BASIN OR (CUSTOM PROCEDURE TRAY) ×3 IMPLANT
KIT ROOM TURNOVER OR (KITS) ×3 IMPLANT
MANIFOLD NEPTUNE II (INSTRUMENTS) ×3 IMPLANT
NS IRRIG 1000ML POUR BTL (IV SOLUTION) ×3 IMPLANT
PACK TOTAL JOINT (CUSTOM PROCEDURE TRAY) ×3 IMPLANT
PACK UNIVERSAL I (CUSTOM PROCEDURE TRAY) ×3 IMPLANT
PAD ARMBOARD 7.5X6 YLW CONV (MISCELLANEOUS) ×3 IMPLANT
SET HNDPC FAN SPRY TIP SCT (DISPOSABLE) ×2 IMPLANT
SPLINT PLASTER CAST XFAST 5X30 (CAST SUPPLIES) ×2 IMPLANT
SPLINT PLASTER XFAST SET 5X30 (CAST SUPPLIES) ×1
STAPLER VISISTAT 35W (STAPLE) IMPLANT
SUCTION FRAZIER HANDLE 10FR (MISCELLANEOUS)
SUCTION TUBE FRAZIER 10FR DISP (MISCELLANEOUS) IMPLANT
SUT ETHILON 3 0 PS 1 (SUTURE) ×9 IMPLANT
SUT MNCRL AB 4-0 PS2 18 (SUTURE) IMPLANT
SUT MON AB 2-0 CT1 27 (SUTURE) ×6 IMPLANT
SUT PDS AB 0 CT 36 (SUTURE) ×6 IMPLANT
SUT VIC AB 1 CTX 36 (SUTURE) ×2
SUT VIC AB 1 CTX36XBRD ANBCTR (SUTURE) ×4 IMPLANT
TIBIAL COMPONENT LRG (Knees) ×3 IMPLANT
TOWEL OR 17X24 6PK STRL BLUE (TOWEL DISPOSABLE) ×3 IMPLANT
TOWEL OR 17X26 10 PK STRL BLUE (TOWEL DISPOSABLE) ×3 IMPLANT

## 2015-07-30 NOTE — Interval H&P Note (Signed)
History and Physical Interval Note:  07/30/2015 7:10 AM  [name]  has presented today for surgery, with the diagnosis of Left Leg Wound  The various methods of treatment have been discussed with the patient and family. After consideration of risks, benefits and other options for treatment, the patient has consented to  Procedure(s): INCISION AND DRAINAGE (Left) as a surgical intervention .  The patient's history has been reviewed, patient examined, no change in status, stable for surgery.  I have reviewed the patient's chart and labs.  Questions were answered to the patient's satisfaction.     [name]
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
## 2015-07-30 NOTE — Anesthesia Procedure Notes (Signed)
Procedure Name: LMA Insertion Date/Time: 07/30/2015 8:29 AM Performed by: Army FossaPULLIAM, Dahl Higinbotham DANE Pre-anesthesia Checklist: Patient identified, Emergency Drugs available, Suction available, Timeout performed and Patient being monitored Patient Re-evaluated:Patient Re-evaluated prior to inductionOxygen Delivery Method: Circle system utilized Preoxygenation: Pre-oxygenation with 100% oxygen Intubation Type: IV induction LMA: LMA inserted LMA Size: 4.0 Number of attempts: 1 Airway Equipment and Method: Patient positioned with wedge pillow Placement Confirmation: positive ETCO2,  CO2 detector and breath sounds checked- equal and bilateral Tube secured with: Tape Dental Injury: Teeth and Oropharynx as per pre-operative assessment

## 2015-07-30 NOTE — Transfer of Care (Signed)
Immediate Anesthesia Transfer of Care Note  [name]  Procedure(s) Performed: Procedure(s): INCISION AND DRAINAGE (Left) EXCISIONAL TOTAL KNEE ARTHROPLASTY WITH ANTIBIOTIC SPACERS (Left)  Patient Location: PACU  Anesthesia Type:General  Level of Consciousness:  sedated, patient cooperative and responds to stimulation  Airway & Oxygen Therapy:Patient Spontanous Breathing and Patient connected to face mask oxgen  Post-op Assessment:  Report given to PACU RN and Post -op Vital signs reviewed and stable  Post vital signs:  Reviewed and stable  Last Vitals:  Filed Vitals:   07/29/15 2005 07/30/15 0508  BP: 109/66 120/71  Pulse: 77 79  Temp: 36.8 C 36.8 C  Resp: 20 19    Complications: No apparent anesthesia complications

## 2015-07-30 NOTE — Anesthesia Preprocedure Evaluation (Signed)
Anesthesia Evaluation  Patient identified by MRN, date of birth, ID band Patient awake    Reviewed: Allergy & Precautions, NPO status , Patient's Chart, lab work & pertinent test results  History of Anesthesia Complications Negative for: history of anesthetic complications  Airway Mallampati: II  TM Distance: >3 FB Neck ROM: full    Dental  (+) Edentulous Upper, Missing,    Pulmonary shortness of breath, asthma , COPD,  oxygen dependent, Current Smoker,    breath sounds clear to auscultation       Cardiovascular hypertension, Pt. on medications (-) angina(-) Past MI  Rhythm:regular Rate:Normal     Neuro/Psych negative neurological ROS  negative psych ROS   GI/Hepatic Neg liver ROS, GERD  ,  Endo/Other  negative endocrine ROS  Renal/GU negative Renal ROS     Musculoskeletal  (+) Arthritis ,   Abdominal   Peds  Hematology  (+) anemia ,   Anesthesia Other Findings   Reproductive/Obstetrics                             Anesthesia Physical Anesthesia Plan  ASA: III  Anesthesia Plan: General   Post-op Pain Management:    Induction: Intravenous  Airway Management Planned: LMA  Additional Equipment: None  Intra-op Plan:   Post-operative Plan: Extubation in OR  Informed Consent: I have reviewed the patients History and Physical, chart, labs and discussed the procedure including the risks, benefits and alternatives for the proposed anesthesia with the patient or authorized representative who has indicated his/her understanding and acceptance.   Dental advisory given  Plan Discussed with: CRNA and Surgeon  Anesthesia Plan Comments:         Anesthesia Quick Evaluation

## 2015-07-30 NOTE — Anesthesia Postprocedure Evaluation (Signed)
Anesthesia Post Note  [name]  Procedure(s) Performed: Procedure(s) (LRB): INCISION AND DRAINAGE (Left) EXCISIONAL TOTAL KNEE ARTHROPLASTY WITH ANTIBIOTIC SPACERS (Left)  Patient location during evaluation: PACU Anesthesia Type: General Level of consciousness: awake Pain management: pain level controlled Vital Signs Assessment: post-procedure vital signs reviewed and stable Respiratory status: spontaneous breathing Cardiovascular status: stable Postop Assessment: no signs of nausea or vomiting Anesthetic complications: no    Last Vitals:  Filed Vitals:   07/30/15 1217 07/30/15 1239  BP: 163/94 157/93  Pulse: 109 103  Temp:  36.7 C  Resp: 23 22    Last Pain:  Filed Vitals:   07/30/15 1239  PainSc: 4                  Kirstan Fentress

## 2015-07-30 NOTE — Op Note (Signed)
07/29/2015 - 07/30/2015  9:37 AM  PATIENT:  [name]    PRE-OPERATIVE DIAGNOSIS:  Left Leg Wound  POST-OPERATIVE DIAGNOSIS:  Same  PROCEDURE:  INCISION AND DRAINAGE, EXCISIONAL TOTAL KNEE ARTHROPLASTY WITH ANTIBIOTIC SPACERS  SURGEON:  Kelcy Baeten, Jewel BaizeIMOTHY D, MD  ASSISTANT: Aquilla HackerHenry Martensen, PA-C, She was present and scrubbed throughout the case, critical for completion in a timely fashion, and for retraction, instrumentation, and closure. Gean BirchwoodFrank Rowan, MD  ANESTHESIA:   gen  PREOPERATIVE INDICATIONS:  [name] is a  60 y.o. male with a diagnosis of Left Leg Wound who failed conservative measures and elected for surgical management.    The risks benefits and alternatives were discussed with the patient preoperatively including but not limited to the risks of infection, bleeding, nerve injury, cardiopulmonary complications, the need for revision surgery, among others, and the patient was willing to proceed.  OPERATIVE IMPLANTS: stryker spacer  OPERATIVE FINDINGS: purulent fluid  BLOOD LOSS: 300  COMPLICATIONS: none  TOURNIQUET TIME: 60min  OPERATIVE PROCEDURE:  Patient was identified in the preoperative holding area and site was marked by me He was transported to the operating theater and placed on the table in supine position taking care to pad all bony prominences. After a preincinduction time out anesthesia was induced. The left  extremity was prepped and draped in normal sterile fashion and a pre-incision timeout was performed. He received ancef for preoperative antibiotics.   He had purulent fluid expressible from his incision I opened up his stitches he had ruptured his patella tendon repair there was significant amount of gram-negative appearing. Fluid I took cultures I debrided as much synovium as possible. I removed all stitches. I then removed his poly-spacer.  I then used a combination of osteotome and saw blade to remove his implants completely as well as all  cement there was good bone stock remaining. I then performed a thorough irrigation I performed an excisional debridement of synovium and other tissue as needed. Once as happy with the cleanliness I then cemented in a articulated spacer that is antibiotic impregnated.  I closes much of the capsule as possible closed his skin with a nylon stitch placed a wound VAC and sterile dressing I then placed him in a short long-leg splint. He was awoken and taken the PACU in stable condition  POST OPERATIVE PLAN: Nonweightbearing    This note was generated using a template and dragon dictation system. In light of that, I have reviewed the note and all aspects of it are applicable to this case. Any dictation errors are due to the computerized dictation system.

## 2015-07-31 ENCOUNTER — Encounter (HOSPITAL_COMMUNITY): Payer: Self-pay | Admitting: Orthopedic Surgery

## 2015-07-31 LAB — CBC
HCT: 22 % — ABNORMAL LOW (ref 39.0–52.0)
Hemoglobin: 7.2 g/dL — ABNORMAL LOW (ref 13.0–17.0)
MCH: 30.5 pg (ref 26.0–34.0)
MCHC: 32.7 g/dL (ref 30.0–36.0)
MCV: 93.2 fL (ref 78.0–100.0)
Platelets: 377 10*3/uL (ref 150–400)
RBC: 2.36 MIL/uL — ABNORMAL LOW (ref 4.22–5.81)
RDW: 14.7 % (ref 11.5–15.5)
WBC: 12.6 10*3/uL — ABNORMAL HIGH (ref 4.0–10.5)

## 2015-07-31 LAB — BASIC METABOLIC PANEL
Anion gap: 7 (ref 5–15)
BUN: 10 mg/dL (ref 6–20)
CO2: 28 mmol/L (ref 22–32)
Calcium: 8.5 mg/dL — ABNORMAL LOW (ref 8.9–10.3)
Chloride: 95 mmol/L — ABNORMAL LOW (ref 101–111)
Creatinine, Ser: 0.73 mg/dL (ref 0.61–1.24)
GFR calc Af Amer: >60 mL/min (ref >60)
GFR calc non Af Amer: >60 mL/min (ref >60)
Glucose, Bld: 142 mg/dL — ABNORMAL HIGH (ref 65–99)
Potassium: 4.3 mmol/L (ref 3.5–5.1)
Sodium: 130 mmol/L — ABNORMAL LOW (ref 135–145)

## 2015-07-31 LAB — HEMOGLOBIN AND HEMATOCRIT, BLOOD
HCT: 24.7 % — ABNORMAL LOW (ref 39.0–52.0)
Hemoglobin: 8 g/dL — ABNORMAL LOW (ref 13.0–17.0)

## 2015-07-31 LAB — PREPARE RBC (CROSSMATCH)

## 2015-07-31 MED ORDER — FUROSEMIDE 10 MG/ML IJ SOLN
20.0000 mg | Freq: Once | INTRAMUSCULAR | Status: AC
  Administered 2015-07-31: 20 mg via INTRAVENOUS
  Filled 2015-07-31: qty 2

## 2015-07-31 MED ORDER — SODIUM CHLORIDE 0.9 % IV SOLN
Freq: Once | INTRAVENOUS | Status: AC
  Administered 2015-07-31: 10:00:00 via INTRAVENOUS

## 2015-07-31 NOTE — Clinical Social Work Note (Signed)
Clinical Social Work Assessment  Patient Details  Name: [name] MRN: [id number] Date of Birth: 02-17-1955  Date of referral:  07/31/15               Reason for consult:  Facility Placement (From: [facility] SNF)                Permission sought to share information with:  Family Supports, Customer service manager Permission granted to share information::  Yes, Verbal Permission Granted  Name::     son and daughter; however patient states he will be making his own placement decisions  Agency::  patient is refusing SNF at this time, but is agreeable to SNF search "just in case" the MD "makes" him return to SNF.  Patient states he does not wish to return to [facility]  Relationship::     Contact Information:     Housing/Transportation Living arrangements for the past 2 months:  [place] of Information:  Patient Patient Interpreter Needed:  None Criminal Activity/Legal Involvement Pertinent to Current Situation/Hospitalization:  No - Comment as needed Significant Relationships:  Adult Children (daughter) Lives with:  Self Do you feel safe going back to the place where you live?  Yes Need for family participation in patient care:  No (Coment)  Care giving concerns:  No caregivers present at time of this assessment.   Social Worker assessment / plan:  CSW met with patient to review disposition. Patient states that he is from [place] where he was for "just a short while" for rehab.  Per chart review, it appears patient was discharged the beginning of June 2017 to [facility] for STR.  Patient states he did not feel at that time that he was safe to go home, but states now he feels that he is able to return home and does not wish to return to [place] at time of discharge.  Patient is agreeable to SNF search for additional facilities, but states he is NOT going to SNF at discharge.  Patient states that his daughter can "come and check on" him  and that he has a Therapist, sports who sits with him daily from 8-12.  Since he has been at [facility] he is unsure if this service will continue.  Patient states he will follow-up with his aid to determine care offered if home is a viable option for him.  CSW will send referrals out to [facility] as a back up option to home.  Patient is agreeable to this search though states "I hate for you [CSW] to waste your time".    Employment status:  Retired Science writer) PT Recommendations:  Not assessed at this time Information / Referral to community resources:  [place]  Patient/Family's Response to care:  Patient is refusing SNF at this time though medical staff is unsure whether home is a viable option for the patient.  Patient is agreeable to new SNF search, though wishful for disposition-home.  Patient/Family's Understanding of and Emotional Response to Diagnosis, Current Treatment, and Prognosis:  Patient speaks as if he is not realistic regarding his need for supervision, care and focused STR at time of discharge.  Patient expresses frustration regarding his wishes to return home.  Patient understands he will need assistance at home and is agreeable to follow-up with his aid regarding assistance offered.  Emotional Assessment Appearance:  Appears older than stated age Attitude/Demeanor/Rapport:   (appropriate) Affect (typically observed):  Accepting, Adaptable,  Hopeful, Pleasant Orientation:  Oriented to Self, Oriented to Place, Oriented to Situation Alcohol / Substance use:  Not Applicable Psych involvement (Current and /or in the community):  No (Comment)  Discharge Needs  Concerns to be addressed:  No discharge needs identified Readmission within the last 30 days:  No Current discharge risk:  None Barriers to Discharge:  Continued Medical Work up   [employer], LCSW 07/31/2015, 10:51 AM
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
## 2015-07-31 NOTE — Care Management Important Message (Signed)
Important Message  Patient Details  Name: [name] MRN: [id number] Date of Birth: 02/16/1955   Medicare Important Message Given:  Yes    Bernadette HoitShoffner, Coraima Tibbs Coleman 07/31/2015, 9:30 AM

## 2015-07-31 NOTE — Clinical Social Work Note (Addendum)
CSW met with patient to review disposition as patient was discharged last week to [facility] for STR.  Patient does not wish to return to SNF (any SNF) at time of discharge.  Patient wishes to go home with his RN aide (8a-12p daily) and his daughter providing intermittent supervision.  RNCM made aware.  Patient was agreeable to SNF search as a back-up to home.  CSW completed and will present bed offers once available.  Nonnie Done, LCSW [phone]  [address] and [place] Licensed Clinical Social Worker

## 2015-07-31 NOTE — Clinical Social Work Placement (Signed)
   CLINICAL SOCIAL WORK PLACEMENT  NOTE  Date:  07/31/2015  Patient Details  Name: Neomia DearWillie J Stoneman MRN: [id number] Date of Birth: 02/14/1956  Clinical Social Work is seeking post-discharge placement for this patient at the Skilled  Nursing Facility level of care (*CSW will initial, date and re-position this form in  chart as items are completed):  Yes   Patient/family provided with [facility] Clinical Social Work Department's list of facilities offering this level of care within the geographic area requested by the patient (or if unable, by the patient's family).  Yes   Patient/family informed of their freedom to choose among providers that offer the needed level of care, that participate in Medicare, Medicaid or managed care program needed by the patient, have an available bed and are willing to accept the patient.  Yes   Patient/family informed of [facility]'s ownership interest in [facility] and [facility], as well as of the fact that they are under no obligation to receive care at these facilities.  PASRR submitted to EDS on       PASRR number received on       Existing PASRR number confirmed on 07/31/15     FL2 transmitted to all facilities in geographic area requested by pt/family on 07/31/15     FL2 transmitted to all facilities within larger geographic area on       Patient informed that his/her managed care company has contracts with or will negotiate with certain facilities, including the following:            Patient/family informed of bed offers received.  Patient chooses bed at       Physician recommends and patient chooses bed at      Patient to be transferred to   on  .  Patient to be transferred to facility by       Patient family notified on   of transfer.  Name of family member notified:        PHYSICIAN Please sign FL2     Additional Comment:    _______________________________________________ Rondel BatonIngle, Keondria Siever C, LCSW 07/31/2015, 11:00  AM

## 2015-07-31 NOTE — NC FL2 (Signed)
[place] MEDICAID FL2 LEVEL OF CARE SCREENING TOOL     IDENTIFICATION  Patient Name: [name] Birthdate: 18-Nov-1955 Sex: male Admission Date (Current Location): 07/29/2015  [facility] and [place] Number:  Producer, television/film/video and Address:  [facility], [address], [place], [place] [address]      Provider Number: [id number]  Attending Physician Name and Address:  Sheral Apley, MD  Relative Name and Phone Number:       Current Level of Care: Hospital Recommended Level of Care: Skilled Nursing Facility Prior Approval Number:    Date Approved/Denied:   PASRR Number: [id number]  Discharge Plan: SNF    Current Diagnoses: Patient Active Problem List   Diagnosis Date Noted  . Surgical wound dehiscence 07/30/2015  . Wound dehiscence, surgical 07/29/2015  . Tobacco abuse 07/29/2015  . 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 Wound dehiscence 07/19/2015  . Rupture of left patellar tendon, open, post-total knee replacement 07/19/2015  . Patellar tendon rupture 07/19/2015  . Primary osteoarthritis of left knee 06/18/2015  . Chronic obstructive airway disease with asthma (HCC) 06/18/2015  . Essential hypertension 06/18/2015  . Special screening for malignant neoplasms, colon 12/11/2012    Orientation RESPIRATION BLADDER Height & Weight     Self, Time, Situation, Place  Normal Continent Weight:   Height:     BEHAVIORAL SYMPTOMS/MOOD NEUROLOGICAL BOWEL NUTRITION STATUS      Continent Diet (heart healthy/carb modified)  AMBULATORY STATUS COMMUNICATION OF NEEDS Skin   Extensive Assist Verbally Surgical wounds                       Personal Care Assistance Level of Assistance  Bathing, Feeding, Dressing Bathing Assistance:  (Moderate) Feeding assistance: Independent Dressing Assistance:  (Moderate)     Functional Limitations Info             SPECIAL CARE FACTORS FREQUENCY        PT Frequency: daily OT Frequency:  daily            Contractures Contractures Info: Not present    Additional Factors Info                  Current Medications (07/31/2015):  This is the current hospital active medication list Current Facility-Administered Medications  Medication Dose Route Frequency Provider Last Rate Last Dose  . 0.9 %  sodium chloride infusion   Intravenous Once Sheral Apley, MD      . 0.9 %  sodium chloride infusion   Intravenous Continuous Sheral Apley, MD 100 mL/hr at 07/30/15 1745    . acetaminophen (TYLENOL) tablet 650 mg  650 mg Oral Q6H PRN Sheral Apley, MD   650 mg at 07/30/15 1406   Or  . acetaminophen (TYLENOL) suppository 650 mg  650 mg Rectal Q6H PRN Sheral Apley, MD      . albuterol (PROVENTIL) (2.5 MG/3ML) 0.083% nebulizer solution 2.5 mg  2.5 mg Nebulization Q6H PRN Sheral Apley, MD      . aspirin EC tablet 325 mg  325 mg Oral Q breakfast Sheral Apley, MD   325 mg at 07/31/15 0830  . bisacodyl (DULCOLAX) suppository 10 mg  10 mg Rectal Daily PRN Sheral Apley, MD      . cefTRIAXone (ROCEPHIN) 2 g in dextrose 5 % 50 mL IVPB  2 g Intravenous Q24H Sheral Apley, MD   2 g at 07/30/15 1742  .  dexamethasone (DECADRON) injection 10 mg  10 mg Intravenous Once Sheral Apleyimothy D Murphy, MD      . diphenhydrAMINE (BENADRYL) 12.5 MG/5ML elixir 12.5-25 mg  12.5-25 mg Oral Q4H PRN Sheral Apleyimothy D Murphy, MD      . docusate sodium (COLACE) capsule 100 mg  100 mg Oral BID Sheral Apleyimothy D Murphy, MD   100 mg at 07/31/15 1018  . furosemide (LASIX) injection 20 mg  20 mg Intravenous Once Sheral Apleyimothy D Murphy, MD      . HYDROmorphone (DILAUDID) injection 0.5-1 mg  0.5-1 mg Intravenous Q2H PRN Sheral Apleyimothy D Murphy, MD   1 mg at 07/31/15 0430  . ketorolac (TORADOL) 15 MG/ML injection 15 mg  15 mg Intravenous Q8H Sheral Apleyimothy D Murphy, MD   15 mg at 07/31/15 [id number]  . lisinopril (PRINIVIL,ZESTRIL) tablet 20 mg  20 mg Oral BH-q7a Sheral Apleyimothy D Murphy, MD   20 mg at 07/31/15 [id number]  . menthol-cetylpyridinium  (CEPACOL) lozenge 3 mg  1 lozenge Oral PRN Sheral Apleyimothy D Murphy, MD       Or  . phenol (CHLORASEPTIC) mouth spray 1 spray  1 spray Mouth/Throat PRN Sheral Apleyimothy D Murphy, MD      . methocarbamol (ROBAXIN) tablet 500 mg  500 mg Oral Q6H PRN Sheral Apleyimothy D Murphy, MD   500 mg at 07/31/15 1016   Or  . methocarbamol (ROBAXIN) 500 mg in dextrose 5 % 50 mL IVPB  500 mg Intravenous Q6H PRN Sheral Apleyimothy D Murphy, MD      . metoCLOPramide (REGLAN) tablet 5-10 mg  5-10 mg Oral Q8H PRN Sheral Apleyimothy D Murphy, MD       Or  . metoCLOPramide (REGLAN) injection 5-10 mg  5-10 mg Intravenous Q8H PRN Sheral Apleyimothy D Murphy, MD      . ondansetron [facility](ZOFRAN) tablet 4 mg  4 mg Oral Q6H PRN Sheral Apleyimothy D Murphy, MD       Or  . ondansetron [facility](ZOFRAN) injection 4 mg  4 mg Intravenous Q6H PRN Sheral Apleyimothy D Murphy, MD      . oxyCODONE-acetaminophen (PERCOCET) 7.5-325 MG per tablet 1-2 tablet  1-2 tablet Oral Q4H PRN Sheral Apleyimothy D Murphy, MD   2 tablet at 07/31/15 1016  . pantoprazole (PROTONIX) EC tablet 40 mg  40 mg Oral Daily Sheral Apleyimothy D Murphy, MD   40 mg at 07/31/15 1016  . polyethylene glycol (MIRALAX / GLYCOLAX) packet 17 g  17 g Oral Daily PRN Sheral Apleyimothy D Murphy, MD      . sodium chloride flush (NS) 0.9 % injection 10-40 mL  10-40 mL Intracatheter PRN Sheral Apleyimothy D Murphy, MD   10 mL at 07/30/15 1327  . sodium phosphate (FLEET) 7-19 GM/118ML enema 1 enema  1 enema Rectal Once PRN Sheral Apleyimothy D Murphy, MD         Discharge Medications: Please see discharge summary for a list of discharge medications.  Relevant Imaging Results:  Relevant Lab Results:   Additional Information SSN: [id number]  Rondel Batonngle, Adriena Manfre C, LCSW

## 2015-07-31 NOTE — Evaluation (Signed)
Physical Therapy Evaluation Patient Details Name: [name] MRN: [id number] DOB: [birth year] Today's Date: 07/31/2015   History of Present Illness  60 y.o. male who presented for follow up after Left TKA by Dr. Margarita Rana on 07/07/15 with subsequent dehiscence and patella tendon rupture repaired by Dr. Dion Saucier on 07/19/15. Pt now admitted with dehiscence and INCISION AND DRAINAGE, EXCISIONAL TOTAL KNEE ARTHROPLASTY WITH ANTIBIOTIC SPACERS on 07/30/15. PMH: hypertension, asthma, TBI, lumbago, COPD, seizures, TKA 06/2015.    Clinical Impression  Pt admitted with above diagnosis and with functional limitations due to the deficits listed below (see PT Problem List). Pt reporting that he is planning to D/C to home from the hospital despite limited activity tolerance and lack of assistance at home. Based upon the patient's current mobility level, recommending SNF upon D/C/. Will continue to follow and progress mobility as tolerated.        Follow Up Recommendations SNF;Supervision/Assistance - 24 hour    Equipment Recommendations  None recommended by PT (Pt reports having needed equipment)    Recommendations for Other Services       Precautions / Restrictions Precautions Precautions: Knee;Fall Precaution Booklet Issued: No Required Braces or Orthoses: Other Brace/Splint (Lt long leg splint ) Restrictions Weight Bearing Restrictions: Yes LLE Weight Bearing: Non weight bearing      Mobility  Bed Mobility Overal bed mobility: Needs Assistance Bed Mobility: Supine to Sit;Sit to Supine     Supine to sit: Mod assist (assist with LLE and trunk) Sit to supine: Min assist (assist with LLE)      Transfers Overall transfer level: Needs assistance Equipment used: Rolling walker (2 wheeled) Transfers: Sit to/from Stand Sit to Stand: Mod assist         General transfer comment: Attempted X2 with return to sitting after first attempt to reposition Rt LE. Able to stand at EOB with rw  and mod assist. Pt complaining of LLE pain and requesting return to bed.   Ambulation/Gait             General Gait Details: unable to attempt  Stairs            Wheelchair Mobility    Modified Rankin (Stroke Patients Only)       Balance Overall balance assessment: Needs assistance Sitting-balance support: No upper extremity supported Sitting balance-Leahy Scale: Fair     Standing balance support: Bilateral upper extremity supported Standing balance-Leahy Scale: Poor Standing balance comment: using rw for support                             Pertinent Vitals/Pain Pain Assessment: 0-10 Pain Score: 5  Pain Location: Lt knee Pain Descriptors / Indicators: Numbness;Tingling;Burning Pain Intervention(s): Limited activity within patient's tolerance;Monitored during session    Home Living Family/patient expects to be discharged to:: Private residence Living Arrangements: Alone Available Help at Discharge: Available PRN/intermittently;Home health;Family Type of Home: Apartment Home Access: Level entry;Elevator     Home Layout: One level Home Equipment: Walker - 2 wheels;Walker - 4 wheels;Bedside commode Additional Comments: Has nurse aide for about 2 hours daily but hoping to get increased to 3-4 hours. Daughter checks in intermittently.     Prior Function Level of Independence: Needs assistance   Gait / Transfers Assistance Needed: using rw for ambulation with assistance  ADL's / Homemaking Assistance Needed: assist with getting dressed and cooking        Hand Dominance  Extremity/Trunk Assessment   Upper Extremity Assessment: Overall WFL for tasks assessed (able to use to assist with mobility)           Lower Extremity Assessment: RLE deficits/detail RLE Deficits / Details: generalized weakness but able to use to assist to standing LLE Deficits / Details: assistance needed for moving with bed mobility.      Communication    Communication: No difficulties  Cognition Arousal/Alertness: Awake/alert Behavior During Therapy: WFL for tasks assessed/performed Overall Cognitive Status: No family/caregiver present to determine baseline cognitive functioning (Pt with poor insight into current level of mobility/safety. )                      General Comments      Exercises        Assessment/Plan    PT Assessment Patient needs continued PT services  PT Diagnosis Difficulty walking   PT Problem List Decreased strength;Decreased activity tolerance;Decreased balance;Decreased mobility;Decreased safety awareness;Decreased knowledge of precautions  PT Treatment Interventions DME instruction;Gait training;Functional mobility training;Therapeutic activities;Therapeutic exercise;Patient/family education   PT Goals (Current goals can be found in the Care Plan section) Acute Rehab PT Goals Patient Stated Goal: go home from the hospital PT Goal Formulation: With patient Time For Goal Achievement: 08/14/15 Potential to Achieve Goals: Fair    Frequency Min 3X/week   Barriers to discharge Decreased caregiver support      Co-evaluation               End of Session Equipment Utilized During Treatment: Gait belt Activity Tolerance: Patient limited by pain Patient left: in bed;with call bell/phone within reach;with bed alarm set;with nursing/sitter in room;with SCD's reapplied Nurse Communication: Weight bearing status         Time: [id number] PT Time Calculation (min) (ACUTE ONLY): 33 min   Charges:   PT Evaluation $PT Eval Moderate Complexity: 1 Procedure PT Treatments $Therapeutic Activity: 8-22 mins   PT G Codes:        Christiane HaBenjamin J. Roma Bondar, PT, CSCS Pager [phone] Office [phone]  07/31/2015, 1:01 PM

## 2015-07-31 NOTE — Progress Notes (Signed)
OT Cancellation Note  Patient Details Name: Neomia DearWillie J Fujii MRN: [id number] DOB: 06/18/1955   Cancelled Treatment:    Reason Eval/Treat Not Completed: Other (comment) Pt from SNF and plan is to return to SNF. If OT eval needed, please page [phone] or call [phone]. thanks [facility]  Analissa Bayless, OTR/L  [phone] 07/31/2015 07/31/2015, 7:26 AM

## 2015-08-01 LAB — BASIC METABOLIC PANEL
Anion gap: 7 (ref 5–15)
BUN: 17 mg/dL (ref 6–20)
CO2: 27 mmol/L (ref 22–32)
Calcium: 8.7 mg/dL — ABNORMAL LOW (ref 8.9–10.3)
Chloride: 94 mmol/L — ABNORMAL LOW (ref 101–111)
Creatinine, Ser: 0.66 mg/dL (ref 0.61–1.24)
GFR calc Af Amer: >60 mL/min (ref >60)
GFR calc non Af Amer: >60 mL/min (ref >60)
Glucose, Bld: 122 mg/dL — ABNORMAL HIGH (ref 65–99)
Potassium: 4.6 mmol/L (ref 3.5–5.1)
Sodium: 128 mmol/L — ABNORMAL LOW (ref 135–145)

## 2015-08-01 LAB — TYPE AND SCREEN
ABO/RH(D): AB POS
Antibody Screen: NEGATIVE
Unit division: 0
Unit division: 0
Unit division: 0
Unit division: 0

## 2015-08-01 LAB — CBC
HCT: 25.4 % — ABNORMAL LOW (ref 39.0–52.0)
Hemoglobin: 8.2 g/dL — ABNORMAL LOW (ref 13.0–17.0)
MCH: 28.6 pg (ref 26.0–34.0)
MCHC: 32.3 g/dL (ref 30.0–36.0)
MCV: 88.5 fL (ref 78.0–100.0)
Platelets: 325 10*3/uL (ref 150–400)
RBC: 2.87 MIL/uL — ABNORMAL LOW (ref 4.22–5.81)
RDW: 17.7 % — ABNORMAL HIGH (ref 11.5–15.5)
WBC: 12 10*3/uL — ABNORMAL HIGH (ref 4.0–10.5)

## 2015-08-01 NOTE — Progress Notes (Addendum)
Physical Therapy Treatment Patient Details Name: [name] MRN: [id number] DOB: Jul 27, 1955 Today's Date: 08/01/2015    History of Present Illness 60 y.o. male who presented for follow up after Left TKA by Dr. Margarita Rana on 07/07/15 with subsequent dehiscence and patella tendon rupture repaired by Dr. Dion Saucier on 07/19/15. Pt now admitted with dehiscence and INCISION AND DRAINAGE, EXCISIONAL TOTAL KNEE ARTHROPLASTY WITH ANTIBIOTIC SPACERS on 07/30/15. PMH: hypertension, asthma, TBI, lumbago, COPD, seizures, TKA 06/2015.      PT Comments    Patient continues to refuse to get out of bed. He was able to stand and take steps with the therapist but he required mod a for balance and mod cuing for direction. He remains a high fall risk. He would benefit from further skilled therapy.   Follow Up Recommendations  SNF;Supervision/Assistance - 24 hourSNF for rehabiltation     Equipment Recommendations  None recommended by PT (Pt reports having needed equipment)    Recommendations for Other Services       Precautions / Restrictions Precautions Precautions: Knee;Fall Required Braces or Orthoses: Other Brace/Splint (Lt long leg splint ) Restrictions Weight Bearing Restrictions: Yes LLE Weight Bearing: Non weight bearing    Mobility  Bed Mobility Overal bed mobility: Needs Assistance Bed Mobility: Supine to Sit     Supine to sit: Mod assist Sit to supine: Mod assist   General bed mobility comments: Mod a to ger Lleft leg out of the bed and mod a to get left leg back in the bed.   Transfers       Sit to Stand: Mod assist         General transfer comment: Patient again refused to get into chair. HE did agree to stand with therapy. He soood for 4 minutes until he began to report pain into his left leg.   Ambulation/Gait     Assistive device: Rolling walker (2 wheeled)       General Gait Details: ambulated 3;x3. He could not turn so he had to go forward and back. Patient  was unsteady on his feet. Patient able to maintain weight bearing percuations with min cuing. Mod A required for balance and direction with gait.    Stairs            Wheelchair Mobility    Modified Rankin (Stroke Patients Only)       Balance Overall balance assessment: Needs assistance Sitting-balance support: No upper extremity supported Sitting balance-Leahy Scale: Fair     Standing balance support: Bilateral upper extremity supported Standing balance-Leahy Scale: Poor Standing balance comment: using rolling walker for support                     Cognition Arousal/Alertness: Awake/alert Behavior During Therapy: WFL for tasks assessed/performed Overall Cognitive Status: No family/caregiver present to determine baseline cognitive functioning (Pt with poor insight into current level of mobility/safety. )                      Exercises Total Joint Exercises Ankle Circles/Pumps: 20 reps Quad Sets: 20 reps Heel Slides:  (2x10 regular 2x10 resistaed leg press )    General Comments        Pertinent Vitals/Pain Pain Assessment: 0-10 Pain Score: 5  Pain Descriptors / Indicators: Aching;Burning Pain Intervention(s): Limited activity within patient's tolerance;Monitored during session;Premedicated before session;Repositioned    Home Living  Prior Function            PT Goals (current goals can now be found in the care plan section) Acute Rehab PT Goals Patient Stated Goal: go home from the hospital PT Goal Formulation: With patient Time For Goal Achievement: 08/14/15 Potential to Achieve Goals: Fair    Frequency  Min 3X/week    PT Plan      Co-evaluation             End of Session Equipment Utilized During Treatment: Gait belt Activity Tolerance: Patient limited by pain Patient left: in bed;with call bell/phone within reach;with bed alarm set;with nursing/sitter in room;with SCD's reapplied     Time:  1145-1210 PT Time Calculation (min) (ACUTE ONLY): 25 min  Charges:  $Therapeutic Exercise: 8-22 mins $Therapeutic Activity: 8-22 mins                    G Codes:      Dessie Comaavid J Brittiany Wiehe PT DPT  08/01/2015, 3:02 PM

## 2015-08-01 NOTE — Progress Notes (Signed)
Subjective: 2 Days Post-Op Procedure(s) (LRB): INCISION AND DRAINAGE (Left) EXCISIONAL TOTAL KNEE ARTHROPLASTY WITH ANTIBIOTIC SPACERS (Left) Patient reports pain as 4 on 0-10 scale.    Objective: Vital signs in last 24 hours: Temp:  [97.7 F (36.5 C)-98.1 F (36.7 C)] 97.7 F (36.5 C) (06/17 0525) Pulse Rate:  [58-78] 58 (06/17 0525) Resp:  [15-18] 15 (06/17 0525) BP: (103-112)/(64-71) 112/71 mmHg (06/17 0525) SpO2:  [96 %-100 %] 96 % (06/17 0525)  Intake/Output from previous day: 06/16 0701 - 06/17 0700 In: 3535 [P.O.:1220; I.V.:2265; IV Piggyback:50] Out: 1825 [Urine:1825] Intake/Output this shift: Total I/O In: -  Out: 300 [Urine:300]   Recent Labs  07/30/15 1309 07/31/15 0412 07/31/15 2115 08/01/15 0410  HGB 9.8* 7.2* 8.0* 8.2*    Recent Labs  07/31/15 0412 07/31/15 2115 08/01/15 0410  WBC 12.6*  --  12.0*  RBC 2.36*  --  2.87*  HCT 22.0* 24.7* 25.4*  PLT 377  --  325    Recent Labs  07/31/15 0412 08/01/15 0410  NA 130* 128*  K 4.3 4.6  CL 95* 94*  CO2 28 27  BUN 10 17  CREATININE 0.73 0.66  GLUCOSE 142* 122*  CALCIUM 8.5* 8.7*   No results for input(s): LABPT, INR in the last 72 hours.  ABD soft Sensation intact distally Incision: dressing C/D/I  Assessment/Plan: 2 Days Post-Op Procedure(s) (LRB): INCISION AND DRAINAGE (Left) EXCISIONAL TOTAL KNEE ARTHROPLASTY WITH ANTIBIOTIC SPACERS (Left) Advance diet Up with therapyNWB  [name] 08/01/2015, 1:34 PM

## 2015-08-01 NOTE — Progress Notes (Signed)
CRITICAL VALUE ALERT  Critical value received:  Abundant Gram Negative Rods present in wound culture  Date of notification:  08/01/15  Time of notification:  1535  Critical value read back:Yes.    Nurse who received alert:  Carolann LittlerKatelin Sawicki  MD notified (1st page):  Kirstin Shepperson-PA  Time of first page:  1750  MD notified (2nd page):  Time of second page:  Responding MD:  Steele SizerKirstin Shepperson-PA  Time MD responded:  1750  "Keep patient on Rocephin until sensitivity results"

## 2015-08-01 NOTE — Evaluation (Signed)
Occupational Therapy Evaluation Patient Details Name: [name] MRN: [id number] DOB: 04/22/1955 Today's Date: 08/01/2015    History of Present Illness 60 y.o. male who presented for follow up after Left TKA by Dr. Margarita Rana on 07/07/15 with subsequent dehiscence and patella tendon rupture repaired by Dr. Dion Saucier on 07/19/15. Pt now admitted with dehiscence and INCISION AND DRAINAGE, EXCISIONAL TOTAL KNEE ARTHROPLASTY WITH ANTIBIOTIC SPACERS on 07/30/15. PMH: hypertension, asthma, TBI, lumbago, COPD, seizures, TKA 06/2015.     Clinical Impression   Pt. Wants to d/c home but would currently need 24 hour care. Pt. Has decreased ability with self care and mobility. Pt. Needs further training with use of AE and with DME. Pt. Did not want to get out of bed and sit in chair secondary to pain.     Follow Up Recommendations  Home health OT;SNF    Equipment Recommendations       Recommendations for Other Services       Precautions / Restrictions Precautions Precautions: Knee;Fall Required Braces or Orthoses: Other Brace/Splint (Lt long leg splint ) Restrictions Weight Bearing Restrictions: Yes LLE Weight Bearing: Non weight bearing      Mobility Bed Mobility Overal bed mobility: Needs Assistance Bed Mobility: Supine to Sit     Supine to sit: Mod assist Sit to supine: Min assist      Transfers       Sit to Stand: Mod assist         General transfer comment: Pt. refusing to attempt to sit in chiar.    Balance                                            ADL   Eating/Feeding: Independent   Grooming: Wash/dry hands;Wash/dry face;Brushing hair;Supervision/safety   Upper Body Bathing: Supervision/ safety;Sitting   Lower Body Bathing: Maximal assistance   Upper Body Dressing : Supervision/safety   Lower Body Dressing: Total assistance   Toilet Transfer: Moderate assistance   Toileting- Clothing Manipulation and Hygiene: Maximal  assistance         General ADL Comments: Pt. will need training on use of AE.     Vision     Perception     Praxis      Pertinent Vitals/Pain Pain Assessment: 0-10 Pain Score: 5  Pain Location:  (L knee) Pain Descriptors / Indicators: Aching;Burning Pain Intervention(s): Limited activity within patient's tolerance;Premedicated before session     Hand Dominance     Extremity/Trunk Assessment Upper Extremity Assessment Upper Extremity Assessment: Overall WFL for tasks assessed           Communication Communication Communication: No difficulties   Cognition Arousal/Alertness: Awake/alert Behavior During Therapy: WFL for tasks assessed/performed Overall Cognitive Status: No family/caregiver present to determine baseline cognitive functioning (Pt with poor insight into current level of mobility/safety. )                     General Comments       Exercises       Shoulder Instructions      Home Living Family/patient expects to be discharged to:: Private residence Living Arrangements: Alone Available Help at Discharge: Available PRN/intermittently;Home health;Family Type of Home: Apartment Home Access: Level entry;Elevator     Home Layout: One level     Bathroom Shower/Tub: Tub/shower unit Shower/tub characteristics: Curtain Firefighter: Handicapped height  Home Equipment: Walker - 2 wheels;Walker - 4 wheels;Bedside commode;Tub bench   Additional Comments: Has nurse aide for about 2 hours daily but hoping to get increased to 3-4 hours. Daughter checks in intermittently.       Prior Functioning/Environment Level of Independence: Needs assistance  Gait / Transfers Assistance Needed:  (Pt. was Mod I with AMB with walker.) ADL's / Homemaking Assistance Needed:  (Pt. had an aide to assist with ADLs in morning. )        OT Diagnosis: Acute pain   OT Problem List: Decreased activity tolerance;Impaired balance (sitting and/or  standing);Decreased knowledge of use of DME or AE;Decreased knowledge of precautions   OT Treatment/Interventions: Self-care/ADL training;DME and/or AE instruction;Therapeutic activities    OT Goals(Current goals can be found in the care plan section) Acute Rehab OT Goals Patient Stated Goal: go home from the hospital OT Goal Formulation: With patient Time For Goal Achievement: 08/15/15 Potential to Achieve Goals: Good ADL Goals Pt Will Perform Grooming: with modified independence;standing Pt Will Perform Upper Body Bathing: with modified independence;sitting Pt Will Perform Lower Body Bathing: with supervision;with adaptive equipment;sit to/from stand Pt Will Perform Upper Body Dressing: with set-up Pt Will Perform Lower Body Dressing: with min assist;sit to/from stand;with adaptive equipment Pt Will Transfer to Toilet: with supervision;ambulating;bedside commode Pt Will Perform Toileting - Clothing Manipulation and hygiene: with supervision;sit to/from stand Pt Will Perform Tub/Shower Transfer: ambulating;tub bench;with min guard assist  OT Frequency: Min 2X/week   Barriers to D/C: Decreased caregiver support  Pt. does not have 24 hour care.       Co-evaluation              End of Session    Activity Tolerance: Patient limited by pain Patient left: in bed   Time: 0928-1006 OT Time Calculation (min): 38 min Charges:  OT General Charges $OT Visit: 1 Procedure OT Evaluation $OT Eval Moderate Complexity: 1 Procedure OT Treatments $Self Care/Home Management : 8-22 mins G-Codes:    Armarion Greek 08/01/2015, 10:05 AM

## 2015-08-02 DIAGNOSIS — T8454XA Infection and inflammatory reaction due to internal left knee prosthesis, initial encounter: Principal | ICD-10-CM

## 2015-08-02 DIAGNOSIS — B9689 Other specified bacterial agents as the cause of diseases classified elsewhere: Secondary | ICD-10-CM

## 2015-08-02 LAB — BASIC METABOLIC PANEL
Anion gap: 6 (ref 5–15)
BUN: 17 mg/dL (ref 6–20)
CO2: 28 mmol/L (ref 22–32)
Calcium: 8.8 mg/dL — ABNORMAL LOW (ref 8.9–10.3)
Chloride: 96 mmol/L — ABNORMAL LOW (ref 101–111)
Creatinine, Ser: 0.75 mg/dL (ref 0.61–1.24)
GFR calc Af Amer: >60 mL/min (ref >60)
GFR calc non Af Amer: >60 mL/min (ref >60)
Glucose, Bld: 91 mg/dL (ref 65–99)
Potassium: 4.5 mmol/L (ref 3.5–5.1)
Sodium: 130 mmol/L — ABNORMAL LOW (ref 135–145)

## 2015-08-02 LAB — CBC
HCT: 26.6 % — ABNORMAL LOW (ref 39.0–52.0)
Hemoglobin: 8.5 g/dL — ABNORMAL LOW (ref 13.0–17.0)
MCH: 28.3 pg (ref 26.0–34.0)
MCHC: 32 g/dL (ref 30.0–36.0)
MCV: 88.7 fL (ref 78.0–100.0)
Platelets: 370 10*3/uL (ref 150–400)
RBC: 3 MIL/uL — ABNORMAL LOW (ref 4.22–5.81)
RDW: 17 % — ABNORMAL HIGH (ref 11.5–15.5)
WBC: 9.3 10*3/uL (ref 4.0–10.5)

## 2015-08-02 MED ORDER — SODIUM CHLORIDE 0.9 % IV SOLN
500.0000 mg | Freq: Four times a day (QID) | INTRAVENOUS | Status: DC
  Administered 2015-08-02 – 2015-08-07 (×19): 500 mg via INTRAVENOUS
  Filled 2015-08-02 (×26): qty 500

## 2015-08-02 NOTE — Progress Notes (Signed)
Physical Therapy Treatment Patient Details Name: [name] MRN: [id number] DOB: 08/29/1955 Today's Date: 08/02/2015    History of Present Illness 60 y.o. male who presented for follow up after Left TKA by Dr. Margarita Ranaimothy Murphy on 07/07/15 with subsequent dehiscence and patella tendon rupture repaired by Dr. Dion SaucierLandau on 07/19/15. Pt now admitted with dehiscence and INCISION AND DRAINAGE, EXCISIONAL TOTAL KNEE ARTHROPLASTY WITH ANTIBIOTIC SPACERS on 07/30/15. PMH: hypertension, asthma, TBI, lumbago, COPD, seizures, TKA 06/2015.      PT Comments    Contiue to recommend SNF for further skilled PT services as pt is not making progress toward OOB mobility goals. Pt lacks awareness of safety and deficits and  plans on going home alone upon d/c. Pt min/mod A for bed mobility. PT will continue to follow acutely and progress as tolerated.   Follow Up Recommendations  SNF;Supervision/Assistance - 24 hour     Equipment Recommendations  None recommended by PT    Recommendations for Other Services       Precautions / Restrictions Precautions Precautions: Knee;Fall Required Braces or Orthoses: Other Brace/Splint (Lt long leg splint ) Restrictions Weight Bearing Restrictions: Yes LLE Weight Bearing: Non weight bearing    Mobility  Bed Mobility Overal bed mobility: Needs Assistance Bed Mobility: Supine to Sit     Supine to sit: Min assist;HOB elevated Sit to supine: Mod assist   General bed mobility comments: min A to elevate trunk into sitting and mod A to bring bilat LE into bed; cues for sequencing with pt very agitated not really following commands  Transfers                 General transfer comment: pt refused OOB mobility   Ambulation/Gait                 Stairs            Wheelchair Mobility    Modified Rankin (Stroke Patients Only)       Balance Overall balance assessment: Needs assistance Sitting-balance support: No upper extremity supported Sitting  balance-Leahy Scale: Fair                              Cognition Arousal/Alertness: Awake/alert Behavior During Therapy: WFL for tasks assessed/performed Overall Cognitive Status: No family/caregiver present to determine baseline cognitive functioning (Pt with poor insight into current level of mobility/safety. )                      Exercises      General Comments General comments (skin integrity, edema, etc.): pt declined OOB mobility or therex; pt lacks awareness of safety and deficits and continues to refuse SNF before d/c home but lives alone believes a nurse 5X week will be enough assist      Pertinent Vitals/Pain Pain Assessment: Faces Faces Pain Scale: Hurts little more Pain Location: L LE Pain Descriptors / Indicators: Sore ("hurts") Pain Intervention(s): Limited activity within patient's tolerance;Premedicated before session;Repositioned    Home Living                      Prior Function            PT Goals (current goals can now be found in the care plan section) Acute Rehab PT Goals Patient Stated Goal: go home Progress towards PT goals: Not progressing toward goals - comment    Frequency  Min 3X/week  PT Plan Current plan remains appropriate    Co-evaluation             End of Session Equipment Utilized During Treatment: Gait belt Activity Tolerance: Treatment limited secondary to agitation Patient left: in bed;with call bell/phone within reach;with bed alarm set     Time: 4782-9562 PT Time Calculation (min) (ACUTE ONLY): 16 min  Charges:  $Therapeutic Activity: 8-22 mins                    G Codes:      Derek Mound, PTA Pager: [phone]   08/02/2015, 3:49 PM

## 2015-08-02 NOTE — Progress Notes (Signed)
[facility] for Infectious Disease    Date of Admission:  07/29/2015   Total days of antibiotics 1           ID: [name] is a 60 y.o. male with hx of complication of left patellar tendon rupture post-total knee replacement. He had been on ceftriaxone to cover enterobacter/strep pneumo, then underwent abtx spacer placement on 6/15, repeat cultures now growing more R enterobacter cloacae Principal Problem:   Wound dehiscence, surgical Active Problems:   Primary osteoarthritis of left knee   Chronic obstructive airway disease with asthma (HCC)   Essential hypertension   Rupture of left patellar tendon, open, post-total knee replacement   Patellar tendon rupture   Prosthetic joint infection ([place])   Tobacco abuse   Surgical wound dehiscence    Subjective: afebrile Medications:  . sodium chloride   Intravenous Once  . aspirin EC  325 mg Oral Q breakfast  . docusate sodium  100 mg Oral BID  . ketorolac  15 mg Intravenous Q8H  . lisinopril  20 mg Oral BH-q7a  . pantoprazole  40 mg Oral Daily    Objective: Vital signs in last 24 hours: Temp:  [98 F (36.7 C)-99.2 F (37.3 C)] 99.2 F (37.3 C) (06/18 1300) Pulse Rate:  [63-76] 76 (06/18 1300) Resp:  [15-16] 16 (06/18 1300) BP: (114-140)/(66-75) 139/66 mmHg (06/18 1300) SpO2:  [97 %-100 %] 100 % (06/18 1300)   Lab Results  Recent Labs  08/01/15 0410 08/02/15 0446  WBC 12.0* 9.3  HGB 8.2* 8.5*  HCT 25.4* 26.6*  NA 128* 130*  K 4.6 4.5  CL 94* 96*  CO2 27 28  BUN 17 17  CREATININE 0.66 0.75    Microbiology: 6/15 OR cx = enterobacter cloacae R- CTx, cefazolin, piptazo, ceftaz. S imi, cipro, bactrim Studies/Results: No results found.   Assessment/Plan: Enterobacter Prosthetic joint infection s/p abtx spacer placement - recommend to change abtx to imipenem. Plan on treatmetn for 6 wks. Will get baseline sed rate and crp  SNF  orders:  Diagnosis: PJI  Culture Result: enterobacter  No Known  Allergies  Discharge antibiotics: Per pharmacy protocol  imipenem  Duration: 6 wk End Date: July 23rd  [place] Per Protocol:  Labs weekly while on IV antibiotics: __x CBC with differential _x_ CMP _x_ CRP _x_ ESR  Fax weekly labs to [phone]  Clinic Follow Up Appt: [facility]  @ 6 wk with comer or Genisis Sonnier    [facility], [facility] for Infectious Diseases Cell: [phone] Pager: [phone]  08/02/2015, 2:03 PM

## 2015-08-02 NOTE — Progress Notes (Signed)
Pharmacy Antibiotic Note  [name] is a 60 y.o. male s/p L-TKA on 07/07/15 complicated by a subsequent dehiscence and patella tendon rupture repaired by Dr. Dion SaucierLandau on 07/19/15. The patient was placed on CTX by ID to target intra-op cultures that yielded enterobacter/S PNA. The patient recently had a wound dehiscence requiring admission for reclosure of wound, I&D, washout, and placement of antibiotic spacer - done on 6/15. New intra-op cultures showed a more resistant strand of enterobacter and pharmacy has now been consulted to start Primaxin for coverage.   Plan: 1. Start Primaxin 500 mg IV every 6 hours 2. Will continue to follow renal function, culture results, LOT, and antibiotic de-escalation plans      Temp (24hrs), Avg:98.4 F (36.9 C), Min:98 F (36.7 C), Max:99.2 F (37.3 C)   Recent Labs Lab 07/29/15 1828 07/30/15 1309 07/31/15 0412 08/01/15 0410 08/02/15 0446  WBC  --  14.6* 12.6* 12.0* 9.3  CREATININE 0.70  --  0.73 0.66 0.75    Estimated Creatinine Clearance: 90.7 mL/min (by C-G formula based on Cr of 0.75).    No Known Allergies  Antimicrobials this admission: CTX PTA >> 6/17 Primaxin 6/18 >>  Dose adjustments this admission: n/a  Microbiology results: 6/15 MRSA PCR >> neg 6/15 WCx (intra-op) >> Enterobacter Cloacae (S-Cefepime/Cipro/Gent/Imipenem/Bactrim)  Thank you for allowing pharmacy to be a part of this patient's care.  Georgina PillionElizabeth Yuvraj Pfeifer, PharmD, BCPS Clinical Pharmacist Pager: [phone] 08/02/2015 2:34 PM

## 2015-08-02 NOTE — Progress Notes (Signed)
Subjective: 3 Days Post-Op Procedure(s) (LRB): INCISION AND DRAINAGE (Left) EXCISIONAL TOTAL KNEE ARTHROPLASTY WITH ANTIBIOTIC SPACERS (Left) Patient reports pain as 2 on 0-10 scale.    Objective: Vital signs in last 24 hours: Temp:  [98 F (36.7 C)-98.2 F (36.8 C)] 98.1 F (36.7 C) (06/18 0456) Pulse Rate:  [63-72] 63 (06/18 0456) Resp:  [15-16] 15 (06/18 0456) BP: (114-140)/(68-75) 140/75 mmHg (06/18 0456) SpO2:  [97 %-100 %] 98 % (06/18 0456)  Intake/Output from previous day: 06/17 0701 - 06/18 0700 In: 120 [P.O.:120] Out: 300 [Urine:300] Intake/Output this shift:     Recent Labs  07/30/15 1309 07/31/15 0412 07/31/15 2115 08/01/15 0410 08/02/15 0446  HGB 9.8* 7.2* 8.0* 8.2* 8.5*    Recent Labs  08/01/15 0410 08/02/15 0446  WBC 12.0* 9.3  RBC 2.87* 3.00*  HCT 25.4* 26.6*  PLT 325 370    Recent Labs  08/01/15 0410 08/02/15 0446  NA 128* 130*  K 4.6 4.5  CL 94* 96*  CO2 27 28  BUN 17 17  CREATININE 0.66 0.75  GLUCOSE 122* 91  CALCIUM 8.7* 8.8*   No results for input(s): LABPT, INR in the last 72 hours.  Neurovascular intact Sensation intact distally Dorsiflexion/Plantar flexion intact Incision: dressing C/D/I  Assessment/Plan: 3 Days Post-Op Procedure(s) (LRB): INCISION AND DRAINAGE (Left) EXCISIONAL TOTAL KNEE ARTHROPLASTY WITH ANTIBIOTIC SPACERS (Left) Advance diet Up with therapy  Patient's gram stain was gram positive cocci but his culture is growing gram negative rods.  He is on Rocephin that has some gram negative coverage but have placed a call to Dr Jerolyn Centerynthia Snyder of infectious disease to give her recommendations on empiric therapy in the setting of these new findings  Kumiko Fishman J 08/02/2015, 8:49 AM

## 2015-08-03 LAB — GLUCOSE, CAPILLARY: Glucose-Capillary: 125 mg/dL — ABNORMAL HIGH (ref 65–99)

## 2015-08-03 LAB — CBC
HCT: 27.4 % — ABNORMAL LOW (ref 39.0–52.0)
Hemoglobin: 8.6 g/dL — ABNORMAL LOW (ref 13.0–17.0)
MCH: 28.3 pg (ref 26.0–34.0)
MCHC: 31.4 g/dL (ref 30.0–36.0)
MCV: 90.1 fL (ref 78.0–100.0)
Platelets: 378 10*3/uL (ref 150–400)
RBC: 3.04 MIL/uL — ABNORMAL LOW (ref 4.22–5.81)
RDW: 16.5 % — ABNORMAL HIGH (ref 11.5–15.5)
WBC: 9.3 10*3/uL (ref 4.0–10.5)

## 2015-08-03 LAB — AEROBIC/ANAEROBIC CULTURE W GRAM STAIN (SURGICAL/DEEP WOUND)

## 2015-08-03 LAB — BASIC METABOLIC PANEL
Anion gap: 5 (ref 5–15)
BUN: 18 mg/dL (ref 6–20)
CO2: 28 mmol/L (ref 22–32)
Calcium: 8.8 mg/dL — ABNORMAL LOW (ref 8.9–10.3)
Chloride: 99 mmol/L — ABNORMAL LOW (ref 101–111)
Creatinine, Ser: 0.68 mg/dL (ref 0.61–1.24)
GFR calc Af Amer: >60 mL/min (ref >60)
GFR calc non Af Amer: >60 mL/min (ref >60)
Glucose, Bld: 110 mg/dL — ABNORMAL HIGH (ref 65–99)
Potassium: 4.5 mmol/L (ref 3.5–5.1)
Sodium: 132 mmol/L — ABNORMAL LOW (ref 135–145)

## 2015-08-03 LAB — AEROBIC/ANAEROBIC CULTURE (SURGICAL/DEEP WOUND)

## 2015-08-03 LAB — C-REACTIVE PROTEIN: CRP: 4.6 mg/dL — ABNORMAL HIGH (ref <1.0)

## 2015-08-03 LAB — SEDIMENTATION RATE: Sed Rate: 75 mm/hr — ABNORMAL HIGH (ref 0–16)

## 2015-08-03 NOTE — Care Management (Signed)
CM spoke with Dr. Eulah PontMurphy concerning patient. At this time patient is not medically ready for discharge, will need continued close monitoring of wound and continued IV antibiotics.
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
## 2015-08-03 NOTE — Progress Notes (Addendum)
Physical Therapy Treatment Patient Details Name: [name] MRN: [id number] DOB: [birth year] Today's Date: 08/03/2015    History of Present Illness 60 y.o. male who presented for follow up after Left TKA by Dr. Margarita Rana on 07/07/15 with subsequent dehiscence and patella tendon rupture repaired by Dr. Dion Saucier on 07/19/15. Pt now admitted with dehiscence and INCISION AND DRAINAGE, EXCISIONAL TOTAL KNEE ARTHROPLASTY WITH ANTIBIOTIC SPACERS on 07/30/15. PMH: hypertension, asthma, TBI, lumbago, COPD, seizures, TKA 06/2015.      PT Comments    Pt pleasant and agreeable to PT session this am.  Pt remains to report that he is going home at d/c.  Pt refusing SNF placement despite continued recommendations for ST SNF placement.  DME updated for d/c home if pt continues to refuse SNF.  Will continue PT intervention per POC during hospitalization.     Follow Up Recommendations  SNF;Supervision/Assistance - 24 hour (Pt will require HHPT at d/c if patient continues to refuse SNF placement.  )     Equipment Recommendations  Wheelchair (measurements PT);Wheelchair cushion (measurements PT) (will require elevating leg rest with WC and drop arm recliner if pt continues to refuse SNF placement.  )    Recommendations for Other Services       Precautions / Restrictions Precautions Precautions: Knee;Fall;Other (comment) (wound vac on LLE.  ) Required Braces or Orthoses: Other Brace/Splint (long leg splint with ace wrap.) Restrictions Weight Bearing Restrictions: Yes LLE Weight Bearing: Non weight bearing    Mobility  Bed Mobility Overal bed mobility: Needs Assistance Bed Mobility: Supine to Sit;Sit to Supine     Supine to sit: Min assist;HOB elevated Sit to supine: Min assist   General bed mobility comments: assist for L LE, increased time, HOB up, use of rail  Transfers Overall transfer level: Needs assistance Equipment used: None Transfers: Squat Pivot Transfers Sit to Stand: Mod  assist;+2 physical assistance (required increased assistance to stand and maintain weight bearing restriction during perianal care.  )   Squat pivot transfers: Min assist     General transfer comment: instructed in w/c <>bed transfers toward L and R side, min assist for balance, verbal cues for NWB on L LE.  During standing trial pt stood at sink with support from counter but unable to maitnain balance or weight bearing.    Ambulation/Gait Ambulation/Gait assistance:  (unable.  )               Stairs            Wheelchair Mobility    Modified Rankin (Stroke Patients Only)       Balance Overall balance assessment: Needs assistance   Sitting balance-Leahy Scale: Fair       Standing balance-Leahy Scale: Poor Standing balance comment: difficulty maintaining NWB on LLE                    Cognition Arousal/Alertness: Awake/alert Behavior During Therapy: WFL for tasks assessed/performed Overall Cognitive Status: No family/caregiver present to determine baseline cognitive functioning (poor insight into his need for care and home and his current deficits.  )       Memory: Decreased recall of precautions              Exercises      General Comments        Pertinent Vitals/Pain Pain Assessment: 0-10 Pain Score: 10-Worst pain ever Pain Location: LLE Pain Descriptors / Indicators: Guarding;Grimacing;Aching Pain Intervention(s): Monitored during session;Repositioned;Premedicated before session  Home Living                      Prior Function            PT Goals (current goals can now be found in the care plan section) Acute Rehab PT Goals Patient Stated Goal: go home Potential to Achieve Goals: Fair Progress towards PT goals: Progressing toward goals    Frequency  Min 3X/week    PT Plan Current plan remains appropriate;Discharge plan needs to be updated    Co-evaluation PT/OT/SLP Co-Evaluation/Treatment: Yes Reason for  Co-Treatment: For patient/therapist safety;Necessary to address cognition/behavior during functional activity PT goals addressed during session: Mobility/safety with mobility OT goals addressed during session: ADL's and self-care     End of Session Equipment Utilized During Treatment: Gait belt Activity Tolerance: Patient tolerated treatment well;Patient limited by fatigue Patient left: in bed;with call bell/phone within reach;with bed alarm set     Time: 1021-1058 PT Time Calculation (min) (ACUTE ONLY): 37 min  Charges:  $Therapeutic Activity: 8-22 mins                    G Codes:      [name] 08/03/2015, 12:21 PM  [name], PTA pager [phone]

## 2015-08-03 NOTE — Care Management Important Message (Signed)
Important Message  Patient Details  Name: [name] MRN: [id number] Date of Birth: 01/14/1956   Medicare Important Message Given:  Yes    Bernadette HoitShoffner, Abad Manard Coleman 08/03/2015, 10:26 AM

## 2015-08-03 NOTE — Progress Notes (Signed)
Occupational Therapy Treatment Patient Details Name: [name] MRN: [id number] DOB: 09-05-1955 Today's Date: 08/03/2015    History of present illness 60 y.o. male who presented for follow up after Left TKA by Dr. Margarita Rana on 07/07/15 with subsequent dehiscence and patella tendon rupture repaired by Dr. Dion Saucier on 07/19/15. Pt now admitted with dehiscence and INCISION AND DRAINAGE, EXCISIONAL TOTAL KNEE ARTHROPLASTY WITH ANTIBIOTIC SPACERS on 07/30/15. PMH: hypertension, asthma, TBI, lumbago, COPD, seizures, TKA 06/2015.     OT comments  Pt with poor ability to maintain NWB status on L LE and perform standing ADL. Worked on squat pivot transfers to w/c and w/c mobility. Pt continues to insist he can manage at home, but then states there is no way he can manage without being able to put weight on his L LE. Recommending SNF for ST rehab as pt lives alone with only an aide 3 hours, 5 days a week.  Follow Up Recommendations  SNF;Supervision/Assistance - 24 hour (HHOT if pt refuses SNF)    Equipment Recommendations  Wheelchair (measurements OT);Wheelchair cushion (measurements OT) (drop arm commode, elevating leg rests on w/c)    Recommendations for Other Services      Precautions / Restrictions Precautions Precautions: Knee;Fall;Other (comment) (wound vac on L LE) Required Braces or Orthoses: Other Brace/Splint (long leg splint with ace wrap) Restrictions LLE Weight Bearing: Non weight bearing       Mobility Bed Mobility Overal bed mobility: Needs Assistance Bed Mobility: Supine to Sit;Sit to Supine     Supine to sit: Min assist;HOB elevated Sit to supine: Min assist   General bed mobility comments: assist for L LE, increased time, HOB up, use of rail  Transfers Overall transfer level: Needs assistance   Transfers: Squat Pivot Transfers     Squat pivot transfers: Min assist     General transfer comment: instructed in w/c <>bed transfers toward L and R side, min assist  for balance, verbal cues for NWB on L LE    Balance Overall balance assessment: Needs assistance   Sitting balance-Leahy Scale: Fair       Standing balance-Leahy Scale: Poor Standing balance comment: difficulty maintaining NWB on LLE                   ADL Overall ADL's : Needs assistance/impaired     Grooming: Wash/dry hands;Moderate assistance;Standing           Upper Body Dressing : Sitting;Supervision/safety   Lower Body Dressing: Total assistance Lower Body Dressing Details (indicate cue type and reason): for sock Toilet Transfer: Minimal assistance;Squat-pivot Toilet Transfer Details (indicate cue type and reason): simulated bed to w/c Toileting- Clothing Manipulation and Hygiene: Total assistance;Sit to/from stand Toileting - Clothing Manipulation Details (indicate cue type and reason): pt not able to maintain NWB status on LLE and perform pericare       General ADL Comments: Practiced w/c transfers to and from bed, w/c mobility from bed to sink.      Vision                     Perception     Praxis      Cognition   Behavior During Therapy: [facility] for tasks assessed/performed Overall Cognitive Status: No family/caregiver present to determine baseline cognitive functioning (poor insight into his deficits and needs for safe home )                       Extremity/Trunk  Assessment               Exercises     Shoulder Instructions       General Comments      Pertinent Vitals/ Pain       Pain Assessment: 0-10 Pain Score: 10-Worst pain ever Pain Location: L LE Pain Descriptors / Indicators: Grimacing;Guarding;Aching Pain Intervention(s): Monitored during session;Premedicated before session;Repositioned  Home Living                                          Prior Functioning/Environment              Frequency Min 2X/week     Progress Toward Goals  OT Goals(current goals can now be found in the  care plan section)  Progress towards OT goals: Progressing toward goals  Acute Rehab OT Goals Patient Stated Goal: go home Time For Goal Achievement: 08/15/15 Potential to Achieve Goals: Good  Plan Discharge plan needs to be updated    Co-evaluation    PT/OT/SLP Co-Evaluation/Treatment: Yes Reason for Co-Treatment: For patient/therapist safety (pt not likely to work with therapies separately)   OT goals addressed during session: ADL's and self-care      End of Session Equipment Utilized During Treatment: Gait belt (w/c)   Activity Tolerance Patient tolerated treatment well   Patient Left in bed   Nurse Communication Mobility status        Time: 1610-96041021-1056 OT Time Calculation (min): 35 min  Charges: OT General Charges $OT Visit: 1 Procedure OT Treatments $Self Care/Home Management : 8-22 mins  Evern BioMayberry, Darryll Raju Lynn 08/03/2015, 12:07 PM  [phone]

## 2015-08-03 NOTE — Progress Notes (Addendum)
[facility] for Infectious Disease    Date of Admission:  07/29/2015   Total days of antibiotics 5/day 2 imipenem           ID: [name] is a 60 y.o. male with hx of complication of left patellar tendon rupture post-total knee replacement. He had been on ceftriaxone to cover enterobacter/strep pneumo, then underwent abtx spacer placement on 6/15, repeat cultures now growing more R enterobacter cloacae Principal Problem:   Wound dehiscence, surgical Active Problems:   Primary osteoarthritis of left knee   Chronic obstructive airway disease with asthma (HCC)   Essential hypertension   Rupture of left patellar tendon, open, post-total knee replacement   Patellar tendon rupture   Prosthetic joint infection (HCC)   Tobacco abuse   Surgical wound dehiscence    Subjective: Afebrile, having left knee pain. Has wound vac in place on left knee Medications:  . sodium chloride   Intravenous Once  . aspirin EC  325 mg Oral Q breakfast  . docusate sodium  100 mg Oral BID  . imipenem-cilastatin  500 mg Intravenous Q6H  . ketorolac  15 mg Intravenous Q8H  . lisinopril  20 mg Oral BH-q7a  . pantoprazole  40 mg Oral Daily    Objective: Vital signs in last 24 hours: Temp:  [98.1 F (36.7 C)-98.4 F (36.9 C)] 98.1 F (36.7 C) (06/19 0606) Pulse Rate:  [58-75] 58 (06/19 0606) Resp:  [15-16] 15 (06/19 0606) BP: (145-147)/(75-88) 147/88 mmHg (06/19 0606) SpO2:  [99 %-100 %] 100 % (06/19 0606) Physical Exam  Constitutional: He is oriented to person, place, and time. He appears well-developed and under nourished.older than stated age. No distress.  HENT:  Mouth/Throat: Oropharynx is clear and moist. No oropharyngeal exudate.  Cardiovascular: Normal rate, regular rhythm and normal heart sounds. Exam reveals no gallop and no friction rub.  No murmur heard.  Pulmonary/Chest: Effort normal and breath sounds normal. No respiratory distress. He has no wheezes.  Abdominal: Soft.  Bowel sounds are normal. He exhibits no distension. There is no tenderness.  Lymphadenopathy:  He has no cervical adenopathy.  Neurological: He is alert and oriented to person, place, and time.  Skin: Skin is warm and dry. Hypopigmentation to face and scalp Ext: left knee is wrapped, wound vac tubing continues to have serosanginous fluid Psychiatric: He has a normal mood and affect. His behavior is normal.     Lab Results  Recent Labs  08/02/15 0446 08/03/15 0425  WBC 9.3 9.3  HGB 8.5* 8.6*  HCT 26.6* 27.4*  NA 130* 132*  K 4.5 4.5  CL 96* 99*  CO2 28 28  BUN 17 18  CREATININE 0.75 0.68   Lab Results  Component Value Date   ESRSEDRATE 75* 08/03/2015   Lab Results  Component Value Date   CRP 4.6* 08/03/2015     Microbiology: 6/15 OR cx = enterobacter cloacae R- CTx, cefazolin, piptazo, ceftaz. S imi, cipro, bactrim Studies/Results: No results found.   Assessment/Plan: Enterobacter Prosthetic joint infection s/p abtx spacer placement - recommend to continue on imipenem. Plan on treatmetn for 6 wks. Will follow inflammatory markers as outpatient. Once he is ready for SNF placement, can change abtx to ertapenem for ease of dosing  Health maintenance = will check hiv ab, hcv ab  Rash = will check rpr  SNF  orders:  Diagnosis: PJI  Culture Result: enterobacter  No Known Allergies  Discharge antibiotics: Per pharmacy protocol  Ertapenem 1gm IV  daily  Duration: 6 wk End Date: July 23rd  [place] Per Protocol:  Labs weekly while on IV antibiotics: __x CBC with differential _x_ CMP _x_ CRP _x_ ESR  Fax weekly labs to [phone]  Clinic Follow Up Appt: [facility]  @ 6 wk with comer or Vernica Wachtel    Baxter Flattery [facility] for Infectious Diseases Cell: [phone] Pager: [phone]  08/03/2015, 2:28 PM

## 2015-08-03 NOTE — Care Management Note (Signed)
Case Management Note  Patient Details  Name: Neomia DearWillie J Kama MRN: [id number] Date of Birth: 10/17/1955  Subjective/Objective:   60 yr old gentleman s/p I & D of left knee with antibiotic spacers placed.             Action/Plan:Patient informed case manager that he does not want to return to [facility], states he will not go there and will go home with Home Health.CM discussed this with Child psychotherapistsocial worker.  CM is waiting to speak with MD concerning patient's decision. Patient states his daughter will assist in the mornings until his private aide arrives. Also plans to request more hours for his aide.    Expected Discharge Date:                  Expected Discharge Plan:  Home w Home Health Services  In-House Referral:     Discharge planning Services  CM Consult  Post Acute Care Choice:    Choice offered to:  Patient  DME Arranged:    DME Agency:     HH Arranged:  RN, PT, OT HH Agency:     Status of Service:     Medicare Important Message Given:  Yes Date Medicare IM Given:    Medicare IM give by:    Date Additional Medicare IM Given:    Additional Medicare Important Message give by:     If discussed at Long Length of Stay Meetings, dates discussed:    Additional Comments:  Durenda GuthrieBrady, Gareld Obrecht Naomi, RN 08/03/2015, 11:30 AM

## 2015-08-04 LAB — AEROBIC/ANAEROBIC CULTURE W GRAM STAIN (SURGICAL/DEEP WOUND)

## 2015-08-04 LAB — CBC
HCT: 26.6 % — ABNORMAL LOW (ref 39.0–52.0)
Hemoglobin: 8.5 g/dL — ABNORMAL LOW (ref 13.0–17.0)
MCH: 29.3 pg (ref 26.0–34.0)
MCHC: 32 g/dL (ref 30.0–36.0)
MCV: 91.7 fL (ref 78.0–100.0)
Platelets: 374 10*3/uL (ref 150–400)
RBC: 2.9 MIL/uL — ABNORMAL LOW (ref 4.22–5.81)
RDW: 16.4 % — ABNORMAL HIGH (ref 11.5–15.5)
WBC: 8.4 10*3/uL (ref 4.0–10.5)

## 2015-08-04 LAB — BASIC METABOLIC PANEL
Anion gap: 6 (ref 5–15)
BUN: 20 mg/dL (ref 6–20)
CO2: 27 mmol/L (ref 22–32)
Calcium: 8.9 mg/dL (ref 8.9–10.3)
Chloride: 99 mmol/L — ABNORMAL LOW (ref 101–111)
Creatinine, Ser: 0.69 mg/dL (ref 0.61–1.24)
GFR calc Af Amer: >60 mL/min (ref >60)
GFR calc non Af Amer: >60 mL/min (ref >60)
Glucose, Bld: 103 mg/dL — ABNORMAL HIGH (ref 65–99)
Potassium: 4.8 mmol/L (ref 3.5–5.1)
Sodium: 132 mmol/L — ABNORMAL LOW (ref 135–145)

## 2015-08-04 LAB — HIV ANTIBODY (ROUTINE TESTING W REFLEX): HIV Screen 4th Generation wRfx: NONREACTIVE

## 2015-08-04 LAB — AEROBIC/ANAEROBIC CULTURE (SURGICAL/DEEP WOUND)

## 2015-08-04 LAB — HEPATITIS C ANTIBODY: HCV Ab: <0.1 s/co ratio (ref 0.0–0.9)

## 2015-08-04 LAB — RPR: RPR Ser Ql: NONREACTIVE

## 2015-08-04 MED ORDER — HYDROMORPHONE HCL 1 MG/ML IJ SOLN
0.5000 mg | INTRAMUSCULAR | Status: DC | PRN
  Administered 2015-08-05: 0.5 mg via INTRAVENOUS
  Filled 2015-08-04: qty 1

## 2015-08-04 NOTE — Progress Notes (Signed)
Physical Therapy Treatment Patient Details Name: [name] MRN: [id number] DOB: 04/18/1955 Today's Date: 08/04/2015    History of Present Illness 60 y.o. male who presented for follow up after Left TKA by Dr. Margarita Rana on 07/07/15 with subsequent dehiscence and patella tendon rupture repaired by Dr. Dion Saucier on 07/19/15. Pt now admitted with dehiscence and INCISION AND DRAINAGE, EXCISIONAL TOTAL KNEE ARTHROPLASTY WITH ANTIBIOTIC SPACERS on 07/30/15. PMH: hypertension, asthma, TBI, lumbago, COPD, seizures, TKA 06/2015.      PT Comments    Pt making gradual progress with transfers from bed to chair. Attempted to discuss D/C plan with patient and he remains adamant that he will not go to SNF and will only return home. He reports that he is going to try to get more hours from his aide and have his daughter stay with him when she is not there (not confirmed). PT continues to recommend SNF for further rehabilitation prior to D/C home. Will follow and progress as tolerated.   Follow Up Recommendations  SNF;Supervision/Assistance - 24 hour     Equipment Recommendations  Wheelchair (measurements PT);Wheelchair cushion (measurements PT) (elevating leg rest (Lt) )    Recommendations for Other Services       Precautions / Restrictions Precautions Precautions: Knee;Fall Precaution Comments: wound vac Required Braces or Orthoses: Other Brace/Splint Other Brace/Splint: long leg cast Restrictions Weight Bearing Restrictions: Yes LLE Weight Bearing: Non weight bearing    Mobility  Bed Mobility Overal bed mobility: Needs Assistance Bed Mobility: Supine to Sit     Supine to sit: Min assist;HOB elevated     General bed mobility comments: assist provided with LLE  Transfers Overall transfer level: Needs assistance Equipment used: None Transfers: Squat Pivot Transfers     Squat pivot transfers: Min guard     General transfer comment: min guard for safety, no cues needed for hand  placement.   Ambulation/Gait                 Stairs            Wheelchair Mobility    Modified Rankin (Stroke Patients Only)       Balance Overall balance assessment: Needs assistance Sitting-balance support: No upper extremity supported Sitting balance-Leahy Scale: Good                              Cognition Arousal/Alertness: Awake/alert Behavior During Therapy: WFL for tasks assessed/performed Overall Cognitive Status: No family/caregiver present to determine baseline cognitive functioning                      Exercises      General Comments        Pertinent Vitals/Pain Pain Assessment: Faces Faces Pain Scale: Hurts even more Pain Location: LLE Pain Descriptors / Indicators: Guarding;Grimacing Pain Intervention(s): Monitored during session    Home Living                      Prior Function            PT Goals (current goals can now be found in the care plan section) Acute Rehab PT Goals Patient Stated Goal: go home PT Goal Formulation: With patient Time For Goal Achievement: 08/14/15 Potential to Achieve Goals: Fair Progress towards PT goals: Progressing toward goals (slow progress)    Frequency  Min 3X/week    PT Plan Current plan remains appropriate;Discharge plan needs to  be updated    Co-evaluation             End of Session Equipment Utilized During Treatment: Gait belt Activity Tolerance: Patient tolerated treatment well Patient left: in chair;with call bell/phone within reach (LLE supported with pillow)     Time: 4540-98111435-1449 PT Time Calculation (min) (ACUTE ONLY): 14 min  Charges:  $Therapeutic Activity: 8-22 mins                    G Codes:      Christiane HaBenjamin J. Virgle Arth, PT, CSCS Pager [phone] Office [phone]  08/04/2015, 3:03 PM

## 2015-08-04 NOTE — Progress Notes (Signed)
   08/04/15 1040  Clinical Encounter Type  Visited With Patient  Visit Type Initial  Stress Factors  Patient Stress Factors Not reviewed  Chaplain made initial visit on morning rounds.  Chaplain made support available if needed,

## 2015-08-04 NOTE — Consult Note (Addendum)
WOC wound consult note Reason for Consult: Consult requested for Vac dressing change to left knee.  Dr Eulah PontMurphy at the bedside to remove cast, assess wound, and re-apply new cast with a window for the Vac dressing. Wound type: Full thickness post-op wound to left knee Measurement: Approx 10X1cm closed incision with sutures intact and well-approximated Drainage (amount, consistency, odor) Small amt blood-tinged drainage from the middle knee area; .2X.2cm, too narrow to pack with a wick Periwound: Intact skin surrounding Dressing procedure/placement/frequency: Applied xeroform to protect skin, then one piece of black foam to cont suction at 125mm.  Pt was medicated for pain prior to the procedure but the casting process and leg movement was painful.  [facility] literature states incisional Vacs can remain in place 5-7 days before dressing changes are necessary. Please re-consult if further assistance is needed.  Thank-you,  Cammie Mcgeeawn Faelynn Wynder MSN, RN, CWOCN, [place], CNS [phone]

## 2015-08-04 NOTE — Progress Notes (Signed)
PT Cancellation Note  Patient Details Name: [name] MRN: [id number] DOB: 07/20/1955   Cancelled Treatment:    Reason Eval/Treat Not Completed: Other (comment), pt reports that he is about to get his splint redone, requests check back later if possible.    Christiane HaBenjamin J. Clarabel Marion, PT, CSCS Pager [phone] Office [phone]  08/04/2015, 11:50 AM
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
## 2015-08-05 DIAGNOSIS — T8450XS Infection and inflammatory reaction due to unspecified internal joint prosthesis, sequela: Secondary | ICD-10-CM

## 2015-08-05 DIAGNOSIS — A498 Other bacterial infections of unspecified site: Secondary | ICD-10-CM

## 2015-08-05 DIAGNOSIS — T8131XS Disruption of external operation (surgical) wound, not elsewhere classified, sequela: Secondary | ICD-10-CM

## 2015-08-05 LAB — CBC
HCT: 26.2 % — ABNORMAL LOW (ref 39.0–52.0)
Hemoglobin: 8.2 g/dL — ABNORMAL LOW (ref 13.0–17.0)
MCH: 28.3 pg (ref 26.0–34.0)
MCHC: 31.3 g/dL (ref 30.0–36.0)
MCV: 90.3 fL (ref 78.0–100.0)
Platelets: 377 10*3/uL (ref 150–400)
RBC: 2.9 MIL/uL — ABNORMAL LOW (ref 4.22–5.81)
RDW: 16.4 % — ABNORMAL HIGH (ref 11.5–15.5)
WBC: 7.2 10*3/uL (ref 4.0–10.5)

## 2015-08-05 LAB — BASIC METABOLIC PANEL
Anion gap: 6 (ref 5–15)
BUN: 13 mg/dL (ref 6–20)
CO2: 28 mmol/L (ref 22–32)
Calcium: 9 mg/dL (ref 8.9–10.3)
Chloride: 100 mmol/L — ABNORMAL LOW (ref 101–111)
Creatinine, Ser: 0.69 mg/dL (ref 0.61–1.24)
GFR calc Af Amer: >60 mL/min (ref >60)
GFR calc non Af Amer: >60 mL/min (ref >60)
Glucose, Bld: 114 mg/dL — ABNORMAL HIGH (ref 65–99)
Potassium: 4.5 mmol/L (ref 3.5–5.1)
Sodium: 134 mmol/L — ABNORMAL LOW (ref 135–145)
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
## 2015-08-05 NOTE — Progress Notes (Signed)
[facility]  Patient Status: New pt for [facility] this hospital admission  [facility] is providing the following services: HHRN, PT, OT and Home Infusion Pharmacy team for home IV ABX upon DC to.  [facility] will provide in hospital teaching for home IV ABX to support independence at home.  We will follow until DC to support transition home and ensure [facility] care needs are met.    If patient discharges after hours, please call [phone].   Larry Sierras 08/05/2015, 3:24 PM

## 2015-08-05 NOTE — Progress Notes (Signed)
Pharmacy Antibiotic Note  [name] is a 60 y.o. male s/p L-TKA on 07/07/15 complicated by a subsequent dehiscence and patella tendon rupture repaired by Dr. Dion SaucierLandau on 07/19/15. The patient was placed on CTX by ID to target intra-op cultures that yielded enterobacter/S PNA. The patient recently had a wound dehiscence requiring admission for reclosure of wound, I&D, washout, and placement of antibiotic spacer - done on 6/15. New intra-op cultures showed a more resistant strand of enterobacter and pharmacy has now been consulted to start Primaxin for coverage.   PICC has been placed. Plan is to disharge to SNF with Ertapenem 1g IV q24h through 09/06/2015.  Plan: -Primaxin 500 mg IV q6h for now -Will continue to follow renal function, clinical progression, any changes to above plan     Temp (24hrs), Avg:98.1 F (36.7 C), Min:98 F (36.7 C), Max:98.2 F (36.8 C)   Recent Labs Lab 08/01/15 0410 08/02/15 0446 08/03/15 0425 08/04/15 0500 08/05/15 0520  WBC 12.0* 9.3 9.3 8.4 7.2  CREATININE 0.66 0.75 0.68 0.69 0.69    Estimated Creatinine Clearance: 90.7 mL/min (by C-G formula based on Cr of 0.69).    No Known Allergies  Antimicrobials this admission: CTX PTA >> 6/17 Primaxin 6/18 >>  Dose adjustments this admission: n/a  Microbiology results: 6/15 MRSA PCR >> neg 6/15 WCx (intra-op) >> Enterobacter Cloacae (S-Cefepime/Cipro/Gent/Imipenem/Bactrim)  Thank you for allowing pharmacy to be a part of this patient's care.  Maurya Nethery D. Jamesha Ellsworth, PharmD, BCPS Clinical Pharmacist Pager: [phone] 08/05/2015 12:51 PM

## 2015-08-05 NOTE — Progress Notes (Signed)
Physical Therapy Treatment Patient Details Name: [name] MRN: [id number] DOB: 03/11/1955 Today's Date: 08/05/2015    History of Present Illness 60 y.o. male who presented for follow up after Left TKA by Dr. Margarita Ranaimothy Murphy on 07/07/15 with subsequent dehiscence and patella tendon rupture repaired by Dr. Dion SaucierLandau on 07/19/15. Pt now admitted with dehiscence and INCISION AND DRAINAGE, EXCISIONAL TOTAL KNEE ARTHROPLASTY WITH ANTIBIOTIC SPACERS on 07/30/15. PMH: hypertension, asthma, TBI, lumbago, COPD, seizures, TKA 06/2015.      PT Comments    Pt continues to mobilize slowly, encouragement needed throughout for participation. When discussing D/C plans and need to mobilize, pt becoming agitated. Pt unwilling to discuss options outside home for D/C. Pt did stand with rw X30-45 seconds with rw and NWB. Pt unwilling to attempt steps or any further mobility. Based upon the patients current mobility level, recommending SNF at D/C. Will continue to follow and progress as tolerated.   Follow Up Recommendations  SNF;Supervision/Assistance - 24 hour     Equipment Recommendations  Wheelchair (measurements PT);Wheelchair cushion (measurements PT)    Recommendations for Other Services       Precautions / Restrictions Precautions Precautions: Knee;Fall Precaution Comments: wound vac Required Braces or Orthoses: Other Brace/Splint Other Brace/Splint: long leg cast Restrictions Weight Bearing Restrictions: Yes LLE Weight Bearing: Non weight bearing    Mobility  Bed Mobility               General bed mobility comments: pt in chair upon arrival  Transfers Overall transfer level: Needs assistance Equipment used: Rolling walker (2 wheeled) Transfers: Sit to/from Stand Sit to Stand: Min assist         General transfer comment: requiring X2 attempts to get to standing with assist needed. Encouragement needed for participation. Pt standing with rw approx. 30-45 seconds before sitting back  down. Pt unwilling to attempt again.    Ambulation/Gait             General Gait Details: refusing to attempt   Stairs            Wheelchair Mobility    Modified Rankin (Stroke Patients Only)       Balance Overall balance assessment: Needs assistance Sitting-balance support: No upper extremity supported Sitting balance-Leahy Scale: Good     Standing balance support: Bilateral upper extremity supported Standing balance-Leahy Scale: Poor Standing balance comment: requiring use of rw                    Cognition Arousal/Alertness: Awake/alert Behavior During Therapy: Agitated Overall Cognitive Status: No family/caregiver present to determine baseline cognitive functioning Area of Impairment: Safety/judgement         Safety/Judgement: Decreased awareness of safety;Decreased awareness of deficits     General Comments: poor awareness of deficits    Exercises      General Comments        Pertinent Vitals/Pain Pain Assessment: 0-10 Pain Score: 10-Worst pain ever Faces Pain Scale: Hurts even more Pain Location: Lt leg Pain Descriptors / Indicators: Aching ("feels like a tooth ache") Pain Intervention(s): Monitored during session;Repositioned    Home Living                      Prior Function            PT Goals (current goals can now be found in the care plan section) Acute Rehab PT Goals Patient Stated Goal: go home PT Goal Formulation: With patient Time For Goal  Achievement: 08/14/15 Potential to Achieve Goals: Fair Progress towards PT goals: Progressing toward goals    Frequency  Min 3X/week    PT Plan Current plan remains appropriate    Co-evaluation             End of Session Equipment Utilized During Treatment: Gait belt Activity Tolerance: Patient tolerated treatment well Patient left: in chair;with call bell/phone within reach     Time: 1134-1149 PT Time Calculation (min) (ACUTE ONLY): 15  min  Charges:  $Therapeutic Activity: 8-22 mins                    G Codes:      Christiane Ha, PT, CSCS Pager [phone] Office [phone]  08/05/2015, 12:33 PM

## 2015-08-05 NOTE — Progress Notes (Signed)
   Assessment/Plan: 6 Days Post-Op  S/P INCISION AND DRAINAGE, EXCISIONAL TOTAL KNEE ARTHROPLASTY WITH ANTIBIOTIC SPACERS by Dr. Jewel Baizeimothy D. Murphy on 07/30/15.  Left TKA by Dr. Margarita Ranaimothy Murphy on 07/07/15 with subsequent fall / dehiscence and patella tendon rupture repaired by Dr. Dion SaucierLandau on 07/19/15.   Principal Problem:   Wound dehiscence, surgical Active Problems:   Primary osteoarthritis of left knee   Chronic obstructive airway disease with asthma (HCC)   Essential hypertension   Rupture of left patellar tendon, open, post-total knee replacement   Patellar tendon rupture   Prosthetic joint infection (HCC)   Tobacco abuse   Surgical wound dehiscence Acute blood loss anemia.  H/H Stable, 8.2, will continue to follow.    AFVSN.  WBC wnl.    His splint was changed to a full leg cast with a window for wound access.  Wound care assisted with Vac care.    Eventual return to SNF is recommended.  PLAN: Up with therapy Continue IV ABX therapy due to Post-op infection - changed to imipenem for Enterobacter infection.  Will discharge with ertapenem per ID. Follow H/H. Continue Cast / vac.  Weight Bearing: Non Weight Bearing (NWB) left leg Dressings: Wound vac.  Will plan for possible change again at the end of this week or end of next. VTE prophylaxis: Aspirin Dispo: Skilled Nursing Facility/Rehab - patient prefers to go home, but better understands the level of care needed to treat him and to prevent future complications.  Subjective: Patient reports pain as mild, controlled, w/ occasional crampign.  Eating, drinking, urinating.  +BM.  Objective:   VITALS:   Filed Vitals:   08/04/15 0540 08/04/15 1300 08/04/15 2022 08/05/15 0653  BP: 158/88 154/80 138/84 145/75  Pulse: 64 66 68 62  Temp: 98 F (36.7 C) 98 F (36.7 C) 98.2 F (36.8 C) 98.2 F (36.8 C)  TempSrc: Oral Oral Oral Oral  Resp: 16 16 16 16   SpO2: 100% 100% 100% 100%    Lab Results  Component Value Date   WBC  7.2 08/05/2015   HGB 8.2* 08/05/2015   HCT 26.2* 08/05/2015   MCV 90.3 08/05/2015   PLT 377 08/05/2015   BMET    Component Value Date/Time   NA 134* 08/05/2015 0520   K 4.5 08/05/2015 0520   CL 100* 08/05/2015 0520   CO2 28 08/05/2015 0520   GLUCOSE 114* 08/05/2015 0520   BUN 13 08/05/2015 0520   CREATININE 0.69 08/05/2015 0520   CALCIUM 9.0 08/05/2015 0520   GFRNONAA >60 08/05/2015 0520   GFRAA >60 08/05/2015 0520    Physical Exam General: NAD, Supine in bed. ABD soft Resp: No increased WOB.  Cardio: RRR  Left foot warm. Neurovascular intact.  Cast w/ anterior window in place. Sensation intact distally EHL/FHL intact. Dressing: C/D/I - Low SS vac output   Lucretia KernHenry Calvin Martensen III 08/05/2015, 7:09 AM

## 2015-08-05 NOTE — Progress Notes (Signed)
[facility] for Infectious Disease    Date of Admission:  07/29/2015   Total days of antibiotics 7/day 4 imipenem           ID: [name] is a 60 y.o. male with hx of complication of left patellar tendon rupture post-total knee replacement. He had been on ceftriaxone to cover enterobacter/strep pneumo, then underwent abtx spacer placement on 6/15, repeat cultures now growing more R enterobacter cloacae Principal Problem:   Wound dehiscence, surgical Active Problems:   Primary osteoarthritis of left knee   Chronic obstructive airway disease with asthma (HCC)   Essential hypertension   Rupture of left patellar tendon, open, post-total knee replacement   Patellar tendon rupture   Prosthetic joint infection (HCC)   Tobacco abuse   Surgical wound dehiscence    Subjective: Afebrile, having left knee pain. Has wound vac in place on left knee Medications:  . sodium chloride   Intravenous Once  . aspirin EC  325 mg Oral Q breakfast  . docusate sodium  100 mg Oral BID  . imipenem-cilastatin  500 mg Intravenous Q6H  . lisinopril  20 mg Oral BH-q7a  . pantoprazole  40 mg Oral Daily    Objective: Vital signs in last 24 hours: Temp:  [98.2 F (36.8 C)-98.4 F (36.9 C)] 98.4 F (36.9 C) (06/21 1300) Pulse Rate:  [59-68] 59 (06/21 1300) Resp:  [16-18] 18 (06/21 1300) BP: (138-145)/(71-84) 141/71 mmHg (06/21 1300) SpO2:  [100 %] 100 % (06/21 1300) Physical Exam  Constitutional: He is oriented to person, place, and time. He appears well-developed and under nourished.older than stated age. No distress.  HENT:  Mouth/Throat: Oropharynx is clear and moist. No oropharyngeal exudate.  Cardiovascular: Normal rate, regular rhythm and normal heart sounds. Exam reveals no gallop and no friction rub.  No murmur heard.  Pulmonary/Chest: Effort normal and breath sounds normal. No respiratory distress. He has no wheezes.  Abdominal: Soft. Bowel sounds are normal. He exhibits no  distension. There is no tenderness.  Lymphadenopathy:  He has no cervical adenopathy.  Neurological: He is alert and oriented to person, place, and time.  Skin: Skin is warm and dry. Hypopigmentation to face and scalp Ext: left knee is casted but has a carved out window for patella/knee exposure for wound vac placement, tubing continues to have serosanginous fluid Psychiatric: He has a normal mood and affect. His behavior is normal.     Lab Results  Recent Labs  08/04/15 0500 08/05/15 0520  WBC 8.4 7.2  HGB 8.5* 8.2*  HCT 26.6* 26.2*  NA 132* 134*  K 4.8 4.5  CL 99* 100*  CO2 27 28  BUN 20 13  CREATININE 0.69 0.69   Lab Results  Component Value Date   ESRSEDRATE 75* 08/03/2015   Lab Results  Component Value Date   CRP 4.6* 08/03/2015     Microbiology: 6/15 OR cx = enterobacter cloacae R- CTx, cefazolin, piptazo, ceftaz. S imi, cipro, bactrim Studies/Results: No results found.   Assessment/Plan: Enterobacter Prosthetic joint infection s/p abtx spacer placement - recommend to continue on imipenem. Plan on treatmetn for 6 wks. Will follow inflammatory markers as outpatient. Once he is ready for SNF placement, can change abtx to ertapenem for ease of dosing  Health maintenance =  hiv ab, hcv ab negative  Rash =  rpr negative  SNF  orders:  Diagnosis: PJI  Culture Result: enterobacter  No Known Allergies  Discharge antibiotics: Per pharmacy protocol  Ertapenem  1gm IV daily  Duration: 6 wk End Date: July 23rd  [place] Per Protocol:  Labs weekly while on IV antibiotics: __x CBC with differential _x_ CMP _x_ CRP _x_ ESR  Fax weekly labs to [phone]  Clinic Follow Up Appt: [facility]  @ 6 wk with comer or [place]    Baxter Flattery [facility] for Infectious Diseases Cell: [phone] Pager: [phone]  08/05/2015, 4:31 PM

## 2015-08-06 DIAGNOSIS — A499 Bacterial infection, unspecified: Secondary | ICD-10-CM | POA: Insufficient documentation

## 2015-08-06 LAB — CBC
HCT: 27.1 % — ABNORMAL LOW (ref 39.0–52.0)
Hemoglobin: 8.5 g/dL — ABNORMAL LOW (ref 13.0–17.0)
MCH: 28.8 pg (ref 26.0–34.0)
MCHC: 31.4 g/dL (ref 30.0–36.0)
MCV: 91.9 fL (ref 78.0–100.0)
Platelets: 414 10*3/uL — ABNORMAL HIGH (ref 150–400)
RBC: 2.95 MIL/uL — ABNORMAL LOW (ref 4.22–5.81)
RDW: 16.3 % — ABNORMAL HIGH (ref 11.5–15.5)
WBC: 8.4 10*3/uL (ref 4.0–10.5)

## 2015-08-06 NOTE — Consult Note (Signed)
   [facility]   08/06/2015  Neomia DearWillie J Roscoe 07/22/1955 [id number]    [facility] received. However, Mr. Kong's Primary Care MD is Dr. Concepcion ElkAvbuere. Confirmed this with patient on last hospitalization. Unfortunately, patient not eligible for [facility] services as Dr. Concepcion ElkAvbuere is not a [facility] Provider. Made inpatient RNCM aware that Mr. Roxan HockeyRobinson is not eligible for [facility] at this time.    Raiford NobleAtika Hall, MSN-Ed, RN,BSN [facility] Care Management Hospital Liaison [phone]

## 2015-08-06 NOTE — Progress Notes (Signed)
   Assessment/Plan: 7 Days Post-Op  S/P INCISION AND DRAINAGE, EXCISIONAL TOTAL KNEE ARTHROPLASTY WITH ANTIBIOTIC SPACERS by Dr. Jewel Baizeimothy D. Murphy on 07/30/15.  Left TKA by Dr. Margarita Ranaimothy Murphy on 07/07/15 with subsequent fall / dehiscence and patella tendon rupture repaired by Dr. Dion SaucierLandau on 07/19/15.   Principal Problem:   Wound dehiscence, surgical Active Problems:   Primary osteoarthritis of left knee   Chronic obstructive airway disease with asthma (HCC)   Essential hypertension   Rupture of left patellar tendon, open, post-total knee replacement   Patellar tendon rupture   Prosthetic joint infection (HCC)   Tobacco abuse   Surgical wound dehiscence Acute blood loss anemia.  H/H up.    AFVSN.  WBC wnl.  SNF is recommended - patient still insists on going some with home care.  Still having difficulty with mobilization.  PLAN: Up with therapy Continue IV ABX therapy due to Post-op infection - changed to imipenem for Enterobacter infection.  Will discharge with ertapenem per ID. Follow H/H. Continue Cast / vac.  Weight Bearing: Non Weight Bearing (NWB) left leg Dressings: Wound vac.   VTE prophylaxis: Aspirin Dispo: Skilled Nursing Facility/Rehab - patient insists on going some with home care.  Still having difficulty with mobilization.  Subjective: Patient reports pain as moderate, controlled.  Eating, drinking, urinating.  +BM.  Objective:   VITALS:   Filed Vitals:   08/05/15 0653 08/05/15 1300 08/05/15 2039 08/06/15 0407  BP: 145/75 141/71 132/75 128/80  Pulse: 62 59 65 70  Temp: 98.2 F (36.8 C) 98.4 F (36.9 C) 98.6 F (37 C) 98.1 F (36.7 C)  TempSrc: Oral  Oral Oral  Resp: 16 18 18 18   SpO2: 100% 100% 99% 100%    Lab Results  Component Value Date   WBC 8.4 08/06/2015   HGB 8.5* 08/06/2015   HCT 27.1* 08/06/2015   MCV 91.9 08/06/2015   PLT 414* 08/06/2015    Physical Exam General: NAD, Supine in bed. ABD soft Resp: No increased WOB.  Cardio:  RRR  Left foot warm. Neurovascular intact.  Cast w/ anterior window in place. Sensation intact distally EHL/FHL intact. Dressing: C/D/I - Low SS vac output   Henry KernHenry Calvin Martensen [name] 08/06/2015, 6:50 AM

## 2015-08-07 MED ORDER — SODIUM CHLORIDE 0.9 % IV SOLN: 1.0000 g | INTRAVENOUS | Status: DC

## 2015-08-07 MED ORDER — SODIUM CHLORIDE 0.9 % IV SOLN
1.0000 g | INTRAVENOUS | Status: DC
  Administered 2015-08-07 – 2015-08-09 (×3): 1 g via INTRAVENOUS
  Filled 2015-08-07 (×4): qty 1

## 2015-08-07 NOTE — Progress Notes (Signed)
Pharmacy Antibiotic Note  [name] is a 60 y.o. male s/p L-TKA on 07/07/15 complicated by a subsequent dehiscence and patella tendon rupture repaired by Dr. Dion SaucierLandau on 07/19/15. The patient was placed on CTX by ID to target intra-op cultures that yielded enterobacter/S PNA. The patient recently had a wound dehiscence requiring admission for reclosure of wound, I&D, washout, and placement of antibiotic spacer - done on 6/15. New intra-op cultures showed a more resistant strand of enterobacter and pharmacy has now been consulted to start Primaxin for coverage.   PICC has been placed. Patient is to discharge with home health.  Plan: -Switch to ertapenem 1g IV q24h starting at 1600 today. Stop date of 7/23 has been entered to ease discharge paperwork. -see Dr. Feliz BeamSnider's note from 6/21 for weekly labs to be sent while on ertapenem therapy -pharmacy to sign off and monitor peripherally     Temp (24hrs), Avg:98.1 F (36.7 C), Min:97.8 F (36.6 C), Max:98.4 F (36.9 C)   Recent Labs Lab 08/01/15 0410 08/02/15 0446 08/03/15 0425 08/04/15 0500 08/05/15 0520 08/06/15 0348  WBC 12.0* 9.3 9.3 8.4 7.2 8.4  CREATININE 0.66 0.75 0.68 0.69 0.69  --     Estimated Creatinine Clearance: 90.7 mL/min (by C-G formula based on Cr of 0.69).    No Known Allergies  Antimicrobials this admission: CTX PTA >> 6/17 Primaxin 6/18 >>6/23 Ertapenem 6/23>>(7/23)  Dose adjustments this admission: n/a  Microbiology results: 6/15 MRSA PCR >> neg 6/15 WCx (intra-op) >> Enterobacter Cloacae (S-Cefepime/Cipro/Gent/Imipenem/Bactrim)  Thank you for allowing pharmacy to be a part of this patient's care.  Kaymen Adrian D. Malyn Aytes, PharmD, BCPS Clinical Pharmacist Pager: [phone] 08/07/2015 11:09 AM

## 2015-08-07 NOTE — Discharge Instructions (Signed)
Continue Aspirin 336m daily until 30 days after operation. Leave Dressing and cast on until office follow up. Do not bend or put weight on leg.   Discharge antibiotics: Per pharmacy protocol Ertapenem 1gm IV daily  Duration: 6 wk [place]  Labs weekly while on IV antibiotics: __x CBC with differential _x_ CMP _x_ CRP _x_ ESR  Fax weekly labs to [phone] Clinic Follow Up Appt: [facility]  @ 6 wk with comer or [facility]  SBaxter FlatteryCWhite [facility] Center[facility] Cell: [phone]: [phone]

## 2015-08-07 NOTE — Progress Notes (Signed)
Physical Therapy Treatment Patient Details Name: [name] MRN: [id number] DOB: 04/06/1955 Today's Date: 08/07/2015    History of Present Illness 60 y.o. male who presented for follow up after Left TKA by Dr. Margarita Ranaimothy Murphy on 07/07/15 with subsequent dehiscence and patella tendon rupture repaired by Dr. Dion SaucierLandau on 07/19/15. Pt now admitted with dehiscence and INCISION AND DRAINAGE, EXCISIONAL TOTAL KNEE ARTHROPLASTY WITH ANTIBIOTIC SPACERS on 07/30/15. PMH: hypertension, asthma, TBI, lumbago, COPD, seizures, TKA 06/2015.      PT Comments    Pt with slow and limit progress with PT regarding mobility. Pt able to stand with rw x 70 seconds but limited by patient's reports of pain. Pt refusing to attempt ambulation. Despite limitations in mobility, pt refusing recommendation for SNF and insisting on D/C to home. Will attempt to maximize function and safety.   Follow Up Recommendations  SNF;Supervision/Assistance - 24 hour     Equipment Recommendations  Wheelchair (measurements PT);Wheelchair cushion (measurements PT)    Recommendations for Other Services       Precautions / Restrictions Precautions Precautions: Knee;Fall Precaution Comments: wound vac Required Braces or Orthoses: Other Brace/Splint Other Brace/Splint: long leg cast Restrictions Weight Bearing Restrictions: Yes LLE Weight Bearing: Non weight bearing    Mobility  Bed Mobility               General bed mobility comments: in chair upon arrival  Transfers Overall transfer level: Needs assistance Equipment used: Rolling walker (2 wheeled) Transfers: Sit to/from Stand Sit to Stand: Min assist         General transfer comment: Pt able to stand at EOB x70 seconds before returning to sitting. Pt reports increasing pain.   Ambulation/Gait             General Gait Details: refusing to attempt   Stairs            Wheelchair Mobility    Modified Rankin (Stroke Patients Only)        Balance Overall balance assessment: Needs assistance Sitting-balance support: No upper extremity supported Sitting balance-Leahy Scale: Good     Standing balance support: Bilateral upper extremity supported Standing balance-Leahy Scale: Poor Standing balance comment: requiring use of rw                    Cognition Arousal/Alertness: Awake/alert Behavior During Therapy: WFL for tasks assessed/performed Overall Cognitive Status: No family/caregiver present to determine baseline cognitive functioning Area of Impairment: Safety/judgement               General Comments: poor awareness of deficits    Exercises      General Comments        Pertinent Vitals/Pain Pain Assessment: Faces (no pain at rest, increasing with activity. ) Faces Pain Scale: Hurts whole lot Pain Location: Lt knee  Pain Descriptors / Indicators:  (just hurts) Pain Intervention(s): Limited activity within patient's tolerance;Monitored during session;Repositioned    Home Living                      Prior Function            PT Goals (current goals can now be found in the care plan section) Acute Rehab PT Goals Patient Stated Goal: go home PT Goal Formulation: With patient Time For Goal Achievement: 08/14/15 Potential to Achieve Goals: Fair Progress towards PT goals: Progressing toward goals    Frequency  Min 3X/week    PT Plan Current plan remains appropriate  Co-evaluation             End of Session Equipment Utilized During Treatment: Gait belt Activity Tolerance: Patient limited by pain Patient left: in chair;with call bell/phone within reach;with SCD's reapplied     Time: [id number] PT Time Calculation (min) (ACUTE ONLY): 13 min  Charges:  $Therapeutic Activity: 8-22 mins                    G Codes:      Christiane HaBenjamin J. Kyrianna Barletta, PT, CSCS Pager [phone] Office [phone]  08/07/2015, 1:54 PM

## 2015-08-07 NOTE — Progress Notes (Signed)
   Assessment/Plan: 8 Days Post-Op  S/P INCISION AND DRAINAGE, EXCISIONAL TOTAL KNEE ARTHROPLASTY WITH ANTIBIOTIC SPACERS by Dr. Jewel Baizeimothy D. Murphy on 07/30/15.  Left TKA by Dr. Margarita Ranaimothy Murphy on 07/07/15 with subsequent fall / dehiscence and patella tendon rupture repaired by Dr. Dion SaucierLandau on 07/19/15.   Principal Problem:   Wound dehiscence, surgical Active Problems:   Primary osteoarthritis of left knee   Chronic obstructive airway disease with asthma (HCC)   Essential hypertension   Rupture of left patellar tendon, open, post-total knee replacement   Patellar tendon rupture   Prosthetic joint infection (HCC)   Tobacco abuse   Surgical wound dehiscence   Gram-negative infection Acute blood loss anemia.  Stable.  AFVSN.  WBC wnl.  SNF initially recommended, refused by patient. Arrangements in process for Home care when he is fit to go home.  Still having difficulty with mobilization.  [facility] will provide the following services: HHRN, PT, OT and Home Infusion Pharmacy team for home IV ABX.  He may be discharged with a wound vac system.  PLAN: Up with therapy Continue IV ABX therapy due to Post-op infection - changed to imipenem for Enterobacter infection.  Will discharge with ertapenem until July 23rd per ID.   Follow H/H. Continue Cast / vac.  Weight Bearing: Non Weight Bearing (NWB) left leg Dressings: Wound vac.  May continue this on d/c. VTE prophylaxis: Aspirin [facility].  Possibly early next week.  Subjective: Patient reports pain as moderate, controlled.  Eating, drinking, urinating.  +BM.  Objective:   VITALS:   Filed Vitals:   08/06/15 0407 08/06/15 1300 08/06/15 2017 08/07/15 0445  BP: 128/80 137/94 134/74 149/82  Pulse: 70 72 72 63  Temp: 98.1 F (36.7 C) 98.4 F (36.9 C) 97.8 F (36.6 C) 98.4 F (36.9 C)  TempSrc: Oral  Oral Oral  Resp: 18 18 18 18   SpO2: 100% 100% 100% 100%    Physical Exam General: NAD, Sitting upright in  chair. ABD soft Resp: No increased WOB.  Cardio: RRR MSK: Left foot warm. Neurovascular intact.  Cast w/ anterior window in place. Sensation intact distally EHL/FHL intact. Dressing: C/D/I - Low SS vac output   [name] 08/07/2015, 8:16 AM

## 2015-08-07 NOTE — Care Management Important Message (Signed)
Important Message  Patient Details  Name: [name] MRN: [id number] Date of Birth: 04/11/1955   Medicare Important Message Given:  Yes    Bernadette HoitShoffner, Donyel Nester Coleman 08/07/2015, 9:55 AM

## 2015-08-07 NOTE — Care Management Note (Signed)
Case Management Note  Patient Details  Name: Neomia DearWillie J Zegers MRN: [id number] Date of Birth: 12/14/1955  Subjective/Objective:                    Action/Plan:  Patient adamant that he will not return to a SNF, states he has care providers and with home health he will be ok. Patient did question if he doesn't do well at home would he be able to return to a SNF? Patient informed that Doctor, hospitalsa social worker will follow him at home. Case Manager spoke with Anibal Hendersonim Henderson, Liaison for [facility] to confirm that they are following patient at discharge. Rickie with [facility] contacted concerning Wound Vac. CM will continue to follow.   Expected Discharge Date:    08/10/15              Expected Discharge Plan:  Home w Home Health Services  In-House Referral:     Discharge planning Services  CM Consult  Post Acute Care Choice:  Durable Medical Equipment Choice offered to:  Patient  DME Arranged:  IV pump/equipment, Vac DME Agency:  [facility]  HH Arranged:  RN, PT, OT, Social Work, IV Antibiotics [facility]  Status of Service:  In process, will continue to follow  If discussed at Long Length of Stay Meetings, dates discussed:    Additional Comments:  Durenda GuthrieBrady, Braelyn Bordonaro Naomi, RN 08/07/2015, 12:27 PM

## 2015-08-08 NOTE — Progress Notes (Signed)
Patient ID: [name], male   DOB: 06/20/1955, 60 y.o.   MRN: [id number]     Subjective:  Patient reports pain as mild.  Patient denies any CP or SOB.  In bed and in no acute distress  Objective:   VITALS:   Filed Vitals:   08/07/15 0849 08/07/15 1300 08/07/15 1949 08/08/15 0415  BP: 156/81 119/77 131/74 105/64  Pulse: 76 66 71 75  Temp: 97.8 F (36.6 C) 98.1 F (36.7 C) 97.5 F (36.4 C) 98.2 F (36.8 C)  TempSrc: Oral  Oral Oral  Resp: 18 18 18 18   SpO2: 99% 100% 100% 100%    ABD soft Sensation intact distally Incision: dressing C/D/I Wound vac in place and active Plantar flex and dorsi flexion at baseline  Lab Results  Component Value Date   WBC 8.4 08/06/2015   HGB 8.5* 08/06/2015   HCT 27.1* 08/06/2015   MCV 91.9 08/06/2015   PLT 414* 08/06/2015   BMET    Component Value Date/Time   NA 134* 08/05/2015 0520   K 4.5 08/05/2015 0520   CL 100* 08/05/2015 0520   CO2 28 08/05/2015 0520   GLUCOSE 114* 08/05/2015 0520   BUN 13 08/05/2015 0520   CREATININE 0.69 08/05/2015 0520   CALCIUM 9.0 08/05/2015 0520   GFRNONAA >60 08/05/2015 0520   GFRAA >60 08/05/2015 0520     Assessment/Plan: 9 Days Post-Op   Principal Problem:   Wound dehiscence, surgical Active Problems:   Primary osteoarthritis of left knee   Chronic obstructive airway disease with asthma (HCC)   Essential hypertension   Rupture of left patellar tendon, open, post-total knee replacement   Patellar tendon rupture   Prosthetic joint infection (HCC)   Tobacco abuse   Surgical wound dehiscence   Gram-negative infection   Advance diet Up with therapy Continue plan per Dr [name] leg cast Wound vac   [name] 08/08/2015, 6:11 AM   Henry LucyJoshua Landau, MD Cell [phone]

## 2015-08-08 NOTE — Progress Notes (Signed)
[facility] provided hands on teaching regarding IV ABX set up and administration.  [facility]/prime [facility] of [facility]vanz doses and [facility] RN will see pt 2 x/week to activate med bags due to short stability of drug. Confirmed plan with both Lesli Albeeindy Tucker and Anibal Hendersonim Henderson with [facility] late yesterday.  Pt did well with the set up and administration of Invanz. He has a Scientist, product/process developmentCS worker, Liborio NixonJanice, in the home several days per week for 4 hours/day. I spoke with Liborio NixonJanice and Geraldine Contrasee, her Production designer, theatre/television/filmmanager. Liborio NixonJanice can not administer the IV ABX as she is a CNA. Pt was adamant that she could but seemed to accept fact that she can not after I shared my conversation with her manager.  Liborio NixonJanice, [facility] worker can help pt with obtaining supplies for pt to set up and observe to coach on steps as needed.    [facility] will provide pharmacy services and [facility]. We are both prepared to support DC on Monday AFTER pt receives his dose of Invanz for a SOC with his first home dose on Tuesday, 08/11/15.  [facility] will need script for Invanz at DC.  If patient discharges after hours, please call [phone].   [name] 08/08/2015, 7:38 AM

## 2015-08-09 NOTE — Progress Notes (Signed)
Patient ID: [name], male   DOB: 09/16/1955, 60 y.o.   MRN: [id number]     Subjective:  Patient reports pain as mild.  patient in bed and in no acute distress.  Denies any CP or SOB  Objective:   VITALS:   Filed Vitals:   08/08/15 0415 08/08/15 1500 08/08/15 2026 08/09/15 0539  BP: 105/64 115/75 107/68 122/84  Pulse: 75 69 79 74  Temp: 98.2 F (36.8 C) 98.1 F (36.7 C) 98.3 F (36.8 C) 98.2 F (36.8 C)  TempSrc: Oral  Oral Oral  Resp: 18 16 17 16   SpO2: 100% 100% 100% 99%    ABD soft Sensation intact distally Dorsiflexion/Plantar flexion intact Incision: dressing C/D/I and scant drainage Wound vac in place Motor function at baseline  Lab Results  Component Value Date   WBC 8.4 08/06/2015   HGB 8.5* 08/06/2015   HCT 27.1* 08/06/2015   MCV 91.9 08/06/2015   PLT 414* 08/06/2015   BMET    Component Value Date/Time   NA 134* 08/05/2015 0520   K 4.5 08/05/2015 0520   CL 100* 08/05/2015 0520   CO2 28 08/05/2015 0520   GLUCOSE 114* 08/05/2015 0520   BUN 13 08/05/2015 0520   CREATININE 0.69 08/05/2015 0520   CALCIUM 9.0 08/05/2015 0520   GFRNONAA >60 08/05/2015 0520   GFRAA >60 08/05/2015 0520     Assessment/Plan: 10 Days Post-Op   Principal Problem:   Wound dehiscence, surgical Active Problems:   Primary osteoarthritis of left knee   Chronic obstructive airway disease with asthma (HCC)   Essential hypertension   Rupture of left patellar tendon, open, post-total knee replacement   Patellar tendon rupture   Prosthetic joint infection (HCC)   Tobacco abuse   Surgical wound dehiscence   Gram-negative infection   Advance diet Up with therapy Continue plan per Dr [name] Continue long leg cast Continue wound vac   Henry MayersDOUGLAS [name] 08/09/2015, 11:26 AM   Henry LucyJoshua Landau, MD Cell [phone]

## 2015-08-09 NOTE — Clinical Social Work Note (Signed)
CSW spoke with pt regarding PT recommendation for SNF. Pt refuses SNF for short-term rehab and stated he wants to go home with home health and receive PT at home.

## 2015-08-09 NOTE — Clinical Social Work Note (Signed)
Clinical Social Work Assessment  Patient Details  Name: [name] MRN: [id number] Date of Birth: 12-Nov-1955  Date of referral:  07/31/15               Reason for consult:  Facility Placement, Discharge Planning (From: [facility] SNF)                Permission sought to share information with:  Family Supports, Customer service manager, Case Optician, dispensing granted to share information::  Yes, Verbal Permission Granted  Name::     son and daughter; however patient states he will be making his own placement decisions  Agency::  patient is refusing SNF at this time, but is agreeable to SNF search "just in case" the MD "makes" him return to SNF.  Patient states he does not wish to [facility] to [facility]  Relationship::     Contact Information:     Housing/Transportation Living arrangements for the past 2 months:  [place], [place] of Information:  Patient Patient Interpreter Needed:  None Criminal Activity/Legal Involvement Pertinent to Current Situation/Hospitalization:  No - Comment as needed Significant Relationships:  Adult Children (daughter) Lives with:  Self Do you feel safe going back to the place where you live?  Yes Need for family participation in patient care:  No (Coment)  Care giving concerns: Pt expressed concerns about going home for discharge instead of going to SNF for rehab. Pt not agreeable and refusing SNF for higher level of care.    Social Worker assessment / plan: Holiday representative met with pt and discussed CSW role with discharge planning. Pt shares that he lives at home and has a nurse that provides care for him at his home. CSW explained PT SNF recommendation and pt stated that he would like to go home with home health and receive PT in the home. Pt stated that if he is unable to do well at home with home health and PT he will consider SNF for higher level of care.  CSW will follow up with pt if needed to assist with  discharge.   Employment status:  Retired Science writer) PT Recommendations:  Not assessed at this time Information / Referral to community resources:  [place]  Patient/Family's Response to care: Pt sitting in bed upset and complaining about being in pain. Pt understands SNF recommendation. Pt appears happy with level of care he is receiving at [facility].   Patient/Family's Understanding of and Emotional Response to Diagnosis, Current Treatment, and Prognosis: Pt appears to have a good understanding for admission into the hospital and pt care plan. However pt states he wants to discharge home with home health and receive physical therapy at home.   Emotional Assessment Appearance:  Appears older than stated age Attitude/Demeanor/Rapport:   (appropriate) Affect (typically observed):  Accepting, Adaptable, Hopeful, Pleasant Orientation:  Oriented to Self, Oriented to Place, Oriented to Situation Alcohol / Substance use:  Not Applicable Psych involvement (Current and /or in the community):  No (Comment)  Discharge Needs  Concerns to be addressed:  No discharge needs identified Readmission within the last 30 days:  No Current discharge risk:  None Barriers to Discharge:  Continued Medical Work up   [employer], LCSW 08/09/2015, 2:09 PM

## 2015-08-10 MED ORDER — SODIUM CHLORIDE 0.9 % IV SOLN
500.0000 mg | Freq: Four times a day (QID) | INTRAVENOUS | Status: DC
  Administered 2015-08-10 – 2015-08-12 (×8): 500 mg via INTRAVENOUS
  Filled 2015-08-10 (×11): qty 500

## 2015-08-10 NOTE — Progress Notes (Signed)
   08/10/15 1020  Clinical Encounter Type  Visited With Patient  Visit Type Follow-up  Spiritual Encounters  Spiritual Needs Emotional  Chaplain made a followup visit to patient on morning rounds.. Patient eager to go home.  Patient shared he does not want to go to rehab.  Chaplain made available support to patient if needed.

## 2015-08-10 NOTE — Progress Notes (Signed)
   Assessment/Plan: 11 Days Post-Op  S/P INCISION AND DRAINAGE, EXCISIONAL TOTAL KNEE ARTHROPLASTY WITH ANTIBIOTIC SPACERS by Dr. Jewel Baizeimothy D. Murphy on 07/30/15.  Left TKA by Dr. Margarita Ranaimothy Murphy on 07/07/15 with subsequent fall / dehiscence and patella tendon rupture repaired by Dr. Dion SaucierLandau on 07/19/15.   Principal Problem:   Wound dehiscence, surgical Active Problems:   Primary osteoarthritis of left knee   Chronic obstructive airway disease with asthma (HCC)   Essential hypertension   Rupture of left patellar tendon, open, post-total knee replacement   Patellar tendon rupture   Prosthetic joint infection (HCC)   Tobacco abuse   Surgical wound dehiscence   Gram-negative infection  AFVSN.  Feeling ready to leave hospital and still addiment about going home and not SNF.       [facility], [facility], OT and Home Infusion Pharmacy team for home IV ABX.  He may be discharged with a wound vac system - will discuss same with Dr. Eulah PontMurphy.  D/C would be after P.M. Invanz dose if going home today.  PLAN: Up with therapy Continue IV ABX therapy due to Post-op infection - Enterobacter infection.  Will discharge with ertapenem until July 23rd per ID.   Continue Cast / vac.  Weight Bearing: Non Weight Bearing (NWB) left leg Dressings: Wound vac.  May continue this on d/c. VTE prophylaxis: Aspirin Dispo: Home with Home health services as above.  Possible P.M. Today after abx.  Subjective: Patient reports pain as moderate, controlled.  Eating, drinking, urinating, +BM.  No CP, SOB.  Discussed need for continued smoking cessation again with the patient - he verbalizes understanding.   Objective:   VITALS:   Filed Vitals:   08/09/15 0539 08/09/15 1500 08/09/15 2005 08/10/15 0656  BP: 122/84 103/55 149/93 115/71  Pulse: 74 78 101 87  Temp: 98.2 F (36.8 C) 98.2 F (36.8 C)    TempSrc: Oral Oral    Resp: 16 14 15 14    SpO2: 99% 100% 100% 100%    Physical Exam General: NAD, Sitting upright in bed. ABD soft Resp: No increased WOB.  Cardio: RRR MSK: Left foot warm. Neurovascularly intact.  Cast w/ anterior window in place. Sensation intact distally EHL/FHL intact. Dressing: C/D/I - Low SS vac output   [name] 08/10/2015, 7:25 AM

## 2015-08-10 NOTE — Progress Notes (Signed)
Pharmacy Note : Antibiotics Culture review performed.  It appears that the latest knee culture from 6/15 grew enterobacter cloacae and enterococcus.  Unfortunately, ertapenem does not have reliable enterococcal activity.  Discussed case with Dr. Drue SecondSnider. Plan: Change ertapenem to imipenem 500mg  IV q6h for now.  [name], PharmD, BCPS-AQ ID Clinical Pharmacist Pager [phone]   Recent Results (from the past 720 hour(s))  Aerobic/Anaerobic Culture(surg specimen) (NOT AT [facility])     Status: None   Collection Time: 07/19/15  9:27 PM  Result Value Ref Range Status   Specimen Description FLUID SYNOVIAL LEFT KNEE  Final   Special Requests ID A  SWABS SENT  Final   Gram Stain   Final    FEW WBC PRESENT,BOTH PMN AND MONONUCLEAR NO ORGANISMS SEEN    Culture   Final    FEW ENTEROBACTER CLOACAE FEW STREPTOCOCCUS PNEUMONIAE SUSCEPTIBILITIES PERFORMED ON PREVIOUS CULTURE WITHIN THE LAST 5 DAYS. CRITICAL RESULT CALLED TO, READ BACK BY AND VERIFIED WITH: [name]. DUNN, RN AT 1330 ON [id number] BY S. YARBROUGH NO ANAEROBES ISOLATED    Report Status 07/25/2015 FINAL  Final   Organism ID, Bacteria ENTEROBACTER CLOACAE  Final      Susceptibility   Enterobacter cloacae - MIC*    CEFAZOLIN >=64 RESISTANT Resistant     CEFEPIME <=1 SENSITIVE Sensitive     CEFTAZIDIME <=1 SENSITIVE Sensitive     CEFTRIAXONE <=1 SENSITIVE Sensitive     CIPROFLOXACIN <=0.25 SENSITIVE Sensitive     GENTAMICIN <=1 SENSITIVE Sensitive     IMIPENEM <=0.25 SENSITIVE Sensitive     TRIMETH/SULFA <=20 SENSITIVE Sensitive     PIP/TAZO <=4 SENSITIVE Sensitive     * FEW ENTEROBACTER CLOACAE  Aerobic/Anaerobic Culture(surg specimen) (NOT AT [facility])     Status: None   Collection Time: 07/19/15  9:33 PM  Result Value Ref Range Status   Specimen Description FLUID SYNOVIAL LEFT KNEE  Final   Special Requests ID B SYRINGE SENT  Final   Gram Stain   Final    MANY WBC PRESENT,BOTH PMN AND MONONUCLEAR NO ORGANISMS SEEN    Culture   Final   FEW ENTEROBACTER CLOACAE FEW STREPTOCOCCUS PNEUMONIAE CRITICAL RESULT CALLED TO, READ BACK BY AND VERIFIED WITH: [name],RN AT 1330 ON [id number] BY S. YARBROUGH SUSCEPTIBILITIES PERFORMED ON PREVIOUS CULTURE WITHIN THE LAST 5 DAYS. ENTEROBACTER CLOACAE NO ANAEROBES ISOLATED    Report Status 08/03/2015 FINAL  Final   Organism ID, Bacteria STREPTOCOCCUS PNEUMONIAE  Final      Susceptibility   Streptococcus pneumoniae - MIC*    ERYTHROMYCIN <=0.12 SENSITIVE Sensitive     LEVOFLOXACIN 0.5 SENSITIVE Sensitive     PENICILLIN Value in next row Sensitive      SENSITIVE<=0.06    CEFTRIAXONE Value in next row Sensitive      SENSITIVE<=0.12    * FEW STREPTOCOCCUS PNEUMONIAE  Aerobic/Anaerobic Culture(surg specimen) (NOT AT [facility])     Status: None   Collection Time: 07/19/15  9:35 PM  Result Value Ref Range Status   Specimen Description FLUID SYNOVIAL  Final   Special Requests DEEP LEFT KNEE ID C SWABS SENT  Final   Gram Stain   Final    ABUNDANT WBC PRESENT,BOTH PMN AND MONONUCLEAR NO ORGANISMS SEEN    Culture   Final    FEW ENTEROBACTER CLOACAE SUSCEPTIBILITIES PERFORMED ON PREVIOUS CULTURE WITHIN THE LAST 5 DAYS. FEW STREPTOCOCCUS PNEUMONIAE CRITICAL RESULT CALLED TO, READ BACK BY AND VERIFIED WITH: [name],RN AT 1330 ON [id number] BY S.  YARBROUGH NO ANAEROBES ISOLATED    Report Status 07/25/2015 FINAL  Final  Surgical pcr screen     Status: None   Collection Time: 07/30/15 12:24 AM  Result Value Ref Range Status   MRSA, PCR NEGATIVE NEGATIVE Final   Staphylococcus aureus NEGATIVE NEGATIVE Final    Comment:        The Xpert SA Assay (FDA approved for NASAL specimens in patients over 60 years of age), is one component of a comprehensive surveillance program.  Test performance has been validated by [facility] for patients greater than or equal to 60 year old. It is not intended to diagnose infection nor to guide or monitor treatment.   Aerobic/Anaerobic Culture (surgical/deep wound)      Status: None   Collection Time: 07/30/15 10:11 AM  Result Value Ref Range Status   Specimen Description DRAINAGE LEFT KNEE  Final   Special Requests NONE  Final   Gram Stain   Final    ABUNDANT WBC PRESENT, PREDOMINANTLY PMN FEW GRAM POSITIVE COCCI IN PAIRS    Culture   Final    ABUNDANT ENTEROBACTER CLOACAE FEW ENTEROCOCCUS SPECIES CRITICAL RESULT CALLED TO, READ BACK BY AND VERIFIED WITH: Henry BinningK. SAWICKI,RN AT 1536 ON [id number] BY S. YARBROUGH NO ANAEROBES ISOLATED    Report Status 08/04/2015 FINAL  Final   Organism ID, Bacteria ENTEROBACTER CLOACAE  Final   Organism ID, Bacteria ENTEROCOCCUS SPECIES  Final      Susceptibility   Enterobacter cloacae - MIC*    CEFAZOLIN >=64 RESISTANT Resistant     CEFEPIME 2 SENSITIVE Sensitive     CEFTAZIDIME >=64 RESISTANT Resistant     CEFTRIAXONE >=64 RESISTANT Resistant     CIPROFLOXACIN <=0.25 SENSITIVE Sensitive     GENTAMICIN <=1 SENSITIVE Sensitive     IMIPENEM <=0.25 SENSITIVE Sensitive     TRIMETH/SULFA <=20 SENSITIVE Sensitive     PIP/TAZO >=128 RESISTANT Resistant     * ABUNDANT ENTEROBACTER CLOACAE   Enterococcus species - MIC*    AMPICILLIN <=2 SENSITIVE Sensitive     VANCOMYCIN 1 SENSITIVE Sensitive     GENTAMICIN SYNERGY SENSITIVE Sensitive     * FEW ENTEROCOCCUS SPECIES

## 2015-08-10 NOTE — Progress Notes (Signed)
PT Cancellation Note  Patient Details Name: [name] MRN: [id number] DOB: 01/21/1956   Cancelled Treatment:    Reason Eval/Treat Not Completed: Patient refusing to participate with PT. Pt states, "I don't want any therapy today, maybe tomorrow or something." Encouraged and educated pt on mobilization benefits, pt continuing to refuse services.    Christiane HaBenjamin J. Dolorez Jeffrey, PT, CSCS Pager [phone] Office [phone]  08/10/2015, 11:22 AM

## 2015-08-10 NOTE — Care Management Important Message (Signed)
Important Message  Patient Details  Name: Neomia DearWillie J Gotsch MRN: [id number] Date of Birth: 04/18/1955   Medicare Important Message Given:  Yes    Bernadette HoitShoffner, Keerthi Hazell Coleman 08/10/2015, 8:59 AM

## 2015-08-11 LAB — CBC WITH DIFFERENTIAL/PLATELET
Basophils Absolute: 0 10*3/uL (ref 0.0–0.1)
Basophils Relative: 0 %
Eosinophils Absolute: 0.5 10*3/uL (ref 0.0–0.7)
Eosinophils Relative: 6 %
HCT: 29.7 % — ABNORMAL LOW (ref 39.0–52.0)
Hemoglobin: 9.4 g/dL — ABNORMAL LOW (ref 13.0–17.0)
Lymphocytes Relative: 24 %
Lymphs Abs: 2 10*3/uL (ref 0.7–4.0)
MCH: 28.1 pg (ref 26.0–34.0)
MCHC: 31.6 g/dL (ref 30.0–36.0)
MCV: 88.9 fL (ref 78.0–100.0)
Monocytes Absolute: 0.6 10*3/uL (ref 0.1–1.0)
Monocytes Relative: 7 %
Neutro Abs: 5.1 10*3/uL (ref 1.7–7.7)
Neutrophils Relative %: 62 %
Platelets: 428 10*3/uL — ABNORMAL HIGH (ref 150–400)
RBC: 3.34 MIL/uL — ABNORMAL LOW (ref 4.22–5.81)
RDW: 16.1 % — ABNORMAL HIGH (ref 11.5–15.5)
WBC: 8.3 10*3/uL (ref 4.0–10.5)

## 2015-08-11 LAB — BASIC METABOLIC PANEL
Anion gap: 4 — ABNORMAL LOW (ref 5–15)
BUN: 18 mg/dL (ref 6–20)
CO2: 26 mmol/L (ref 22–32)
Calcium: 9.1 mg/dL (ref 8.9–10.3)
Chloride: 101 mmol/L (ref 101–111)
Creatinine, Ser: 0.76 mg/dL (ref 0.61–1.24)
GFR calc Af Amer: >60 mL/min (ref >60)
GFR calc non Af Amer: >60 mL/min (ref >60)
Glucose, Bld: 114 mg/dL — ABNORMAL HIGH (ref 65–99)
Potassium: 4.3 mmol/L (ref 3.5–5.1)
Sodium: 131 mmol/L — ABNORMAL LOW (ref 135–145)

## 2015-08-11 MED ORDER — OXYCODONE-ACETAMINOPHEN 5-325 MG PO TABS
1.0000 | ORAL_TABLET | Freq: Four times a day (QID) | ORAL | Status: DC | PRN
  Administered 2015-08-11 – 2015-08-12 (×5): 2 via ORAL
  Filled 2015-08-11 (×5): qty 2

## 2015-08-11 MED ORDER — OXYCODONE-ACETAMINOPHEN 5-325 MG PO TABS
2.0000 | ORAL_TABLET | Freq: Once | ORAL | Status: AC
  Administered 2015-08-11: 2 via ORAL
  Filled 2015-08-11: qty 2

## 2015-08-11 NOTE — Consult Note (Addendum)
WOC wound follow-up consult note Reason for Consult: Consult requested for Vac dressing change and Prevena application to left knee.  Wound type: Full thickness post-op wound to left knee Measurement: Approx 10X1cm closed incision with sutures intact and well-approximated Drainage (amount, consistency, odor) Small amt blood-tinged drainage from the middle knee area; .2X.2X.2cm, too narrow to pack with a wick Periwound: Intact skin surrounding and cast with window intact. Dressing procedure/placement/frequency: Applied one piece of purple Prevena foam to portable suction machine at 125mm. Pt was medicated for pain prior to the procedure but the leg movement was painful. Pt can follow-up with Dr Eulah PontMurphy after discharge. Please re-consult if further assistance is needed. Thank-you,  Cammie Mcgeeawn Aven Christen MSN, RN, CWOCN, [place]CN-AP, CNS [phone]

## 2015-08-11 NOTE — Progress Notes (Signed)
   Assessment/Plan: 12 Days Post-Op  S/P INCISION AND DRAINAGE, EXCISIONAL TOTAL KNEE ARTHROPLASTY WITH ANTIBIOTIC SPACERS by Dr. Jewel Baizeimothy D. Murphy on 07/30/15.  Left TKA by Dr. Margarita Ranaimothy Murphy on 07/07/15 with subsequent fall / dehiscence and patella tendon rupture repaired by Dr. Dion SaucierLandau on 07/19/15.   Principal Problem:   Wound dehiscence, surgical Active Problems:   Primary osteoarthritis of left knee   Chronic obstructive airway disease with asthma (HCC)   Essential hypertension   Rupture of left patellar tendon, open, post-total knee replacement   Patellar tendon rupture   Prosthetic joint infection (HCC)   Tobacco abuse   Surgical wound dehiscence   Gram-negative infection  AFVSN.  Cultures reviewed by pharmacy and discussed with Dr. Drue SecondSnider.  ABX changed to imipenem 500mg  IV q6h for now.  Will need to determine appropriate abx before discharge.  [facility], OT and [facility]fusion Pharmacy team for home IV ABX.  He will be discharged with a wound vac system.  Plan for Dr. Eulah PontMurphy to evaluate wound with wound care this morning, early P.M.  PLAN: Up with therapy  Continue IV ABX therapy due to Post-op infection - Enterobacter infection.   Continue Cast / vac.  Weight Bearing: Non Weight Bearing (NWB) left leg Dressings: Wound vac.  Continue this on d/c. VTE prophylaxis: Aspirin Dispo: Home with Home health services as above.   Subjective: Patient reports pain as moderate.  Eating, drinking, urinating, +BM.  No CP, SOB.    Objective:   VITALS:   Filed Vitals:   08/09/15 2005 08/10/15 0656 08/10/15 1333 08/10/15 2025  BP: 149/93 115/71 101/76 104/61  Pulse: 101 87 84 82  Temp:   98.3 F (36.8 C) 98.3 F (36.8 C)  TempSrc:   Oral Oral  Resp: 15 14 16 17   SpO2: 100% 100% 100% 100%    Physical Exam General: NAD, Supine in bed. ABD soft Resp: No increased WOB.  Cardio:  RRR MSK: Left foot warm. Neurovascularly intact.  Cast w/ anterior window in place. Sensation intact distally EHL/FHL intact. Dressing: C/D/I - Low SS vac output   [name] 08/11/2015, 6:03 AM

## 2015-08-12 DIAGNOSIS — B952 Enterococcus as the cause of diseases classified elsewhere: Secondary | ICD-10-CM

## 2015-08-12 DIAGNOSIS — Y838 Other surgical procedures as the cause of abnormal reaction of the patient, or of later complication, without mention of misadventure at the time of the procedure: Secondary | ICD-10-CM

## 2015-08-12 DIAGNOSIS — T8454XD Infection and inflammatory reaction due to internal left knee prosthesis, subsequent encounter: Secondary | ICD-10-CM

## 2015-08-12 DIAGNOSIS — M00862 Arthritis due to other bacteria, left knee: Secondary | ICD-10-CM

## 2015-08-12 MED ORDER — VANCOMYCIN HCL 1000 MG IV SOLR: 1000.0000 mg | INTRAVENOUS | Status: DC

## 2015-08-12 MED ORDER — VANCOMYCIN HCL 10 G IV SOLR
1250.0000 mg | Freq: Once | INTRAVENOUS | Status: DC
  Filled 2015-08-12: qty 1250

## 2015-08-12 MED ORDER — SODIUM CHLORIDE 0.9 % IV SOLN: 1.0000 g | INTRAVENOUS | Status: DC

## 2015-08-12 MED ORDER — HYDROCODONE-ACETAMINOPHEN 5-325 MG PO TABS: 1.0000 | ORAL_TABLET | ORAL | Status: DC | PRN

## 2015-08-12 MED ORDER — HEPARIN SOD (PORK) LOCK FLUSH 100 UNIT/ML IV SOLN
250.0000 [IU] | Freq: Every day | INTRAVENOUS | Status: DC
  Filled 2015-08-12: qty 2.5

## 2015-08-12 MED ORDER — SODIUM CHLORIDE 0.9 % IV SOLN: 500.0000 mg | Freq: Four times a day (QID) | INTRAVENOUS | Status: DC

## 2015-08-12 MED ORDER — ONDANSETRON HCL 4 MG PO TABS: 4.0000 mg | ORAL_TABLET | Freq: Three times a day (TID) | ORAL | Status: DC | PRN

## 2015-08-12 MED ORDER — HEPARIN SOD (PORK) LOCK FLUSH 100 UNIT/ML IV SOLN
250.0000 [IU] | INTRAVENOUS | Status: DC | PRN
  Administered 2015-08-12: 250 [IU]

## 2015-08-12 MED ORDER — VANCOMYCIN HCL 10 G IV SOLR
1500.0000 mg | INTRAVENOUS | Status: DC
  Administered 2015-08-12: 1500 mg via INTRAVENOUS
  Filled 2015-08-12: qty 1500

## 2015-08-12 NOTE — Progress Notes (Signed)
Pt ready for d/c home today per MD. Discharge instructions and prescriptions reviewed with pt, all his questions were answered. He said "his nurse" was going to get his prescriptions filled for him tonight. PICC was capped by IV team for home antibx. He was [place] [facility] because he had no family/friends that could pick him up. [employer] transported his walker and belongings to his apartment.   [place], Latricia HeftKorie G

## 2015-08-12 NOTE — Progress Notes (Addendum)
Physical Therapy Treatment Patient Details Name: [name] MRN: [id number] DOB: 12/25/1955 Today's Date: 08/12/2015    History of Present Illness [age] y.o. male who presented for follow up after Left TKA by Dr. Margarita Ranaimothy Murphy on 07/07/15 with subsequent dehiscence and patella tendon rupture repaired by Dr. Dion SaucierLandau on 07/19/15. Pt now admitted with dehiscence and INCISION AND DRAINAGE, EXCISIONAL TOTAL KNEE ARTHROPLASTY WITH ANTIBIOTIC SPACERS on 07/30/15. PMH: hypertension, asthma, TBI, lumbago, COPD, seizures, TKA 06/2015.      PT Comments    Patient currently min/mod A for safe stand pivot transfer and has had limited progress toward mobility goals and continues to demo decreased awareness of safety and deficits. Pt continues to refuse attempt to ambulate. Discussed safe transfer to w/c with pt verbalizing understanding. Continue to progress as tolerated.   Follow Up Recommendations  SNF;Supervision/Assistance - 24 hour     Equipment Recommendations  Wheelchair (measurements PT);Wheelchair cushion (measurements PT)    Recommendations for Other Services       Precautions / Restrictions Precautions Precautions: Knee;Fall Precaution Comments: wound vac Required Braces or Orthoses: Other Brace/Splint Other Brace/Splint: long leg cast Restrictions Weight Bearing Restrictions: Yes LLE Weight Bearing: Non weight bearing    Mobility  Bed Mobility Overal bed mobility: Needs Assistance Bed Mobility: Supine to Sit;Sit to Supine     Supine to sit: Min assist Sit to supine: Min guard   General bed mobility comments: increased time and use of bedrail; assist to bring L LE to EOB  Transfers Overall transfer level: Needs assistance Equipment used: Rolling walker (2 wheeled) Transfers: Sit to/from [employer] CorporationStand;Stand Pivot Transfers Sit to Stand: Min assist Stand pivot transfers: Min assist       General transfer comment: cues for hand placement, technique to maintain NWB, and safe use of  AD; pt attempted to sit prematurely on EOB and recliner and needed assistance to safely descend and maintain WB status; pt became very agitated and did not follow commands for safety  Ambulation/Gait             General Gait Details: refusing to attempt   Stairs            Wheelchair Mobility    Modified Rankin (Stroke Patients Only)       Balance     Sitting balance-Leahy Scale: Good       Standing balance-Leahy Scale: Poor                      Cognition Arousal/Alertness: Awake/alert Behavior During Therapy: WFL for tasks assessed/performed Overall Cognitive Status: No family/caregiver present to determine baseline cognitive functioning Area of Impairment: Safety/judgement               General Comments: poor awareness of deficits    Exercises      General Comments        Pertinent Vitals/Pain Pain Assessment: Faces Faces Pain Scale: Hurts even more Pain Location: L LE with movement Pain Descriptors / Indicators: Grimacing;Guarding;Sore Pain Intervention(s): Limited activity within patient's tolerance;Monitored during session;Premedicated before session;Repositioned    Home Living                      Prior Function            PT Goals (current goals can now be found in the care plan section) Acute Rehab PT Goals Patient Stated Goal: go home PT Goal Formulation: With patient Time For Goal Achievement: 08/14/15 Potential to Achieve  Goals: Fair Progress towards PT goals: Progressing toward goals    Frequency  Min 3X/week    PT Plan Current plan remains appropriate    Co-evaluation             End of Session Equipment Utilized During Treatment: Gait belt Activity Tolerance: Treatment limited secondary to agitation Patient left: with call bell/phone within reach;in bed     Time: [id number] PT Time Calculation (min) (ACUTE ONLY): 22 min  Charges:  $Therapeutic Activity: 8-22 mins                    G  Codes:      Derek MoundKellyn R Doha Boling Clennon Nasca, PTA Pager: [phone]   08/12/2015, 2:03 PM

## 2015-08-12 NOTE — Progress Notes (Addendum)
[facility] Infectious [facility]    Date of Admission:  07/29/2015   Total days of antibiotics 14/day 4 imipenem           ID: [name] is a 60 y.o. male with hx of complication of left patellar tendon rupture post-total knee replacement. He had been on ceftriaxone to cover enterobacter/strep pneumo, then underwent abtx spacer placement on 6/15, repeat cultures now growing more R enterobacter cloacae Principal Problem:   Wound dehiscence, surgical Active Problems:   Primary osteoarthritis of left knee   Chronic obstructive airway disease with asthma (HCC)   Essential hypertension   Rupture of left patellar tendon, open, post-total knee replacement   Patellar tendon rupture   Prosthetic joint infection (HCC)   Tobacco abuse   Surgical wound dehiscence   Gram-negative infection    Subjective: Afebrile, getting ready for going home Medications:  . sodium chloride   Intravenous Once  . aspirin EC  325 mg Oral Q breakfast  . docusate sodium  100 mg Oral BID  . imipenem-cilastatin  500 mg Intravenous Q6H  . lisinopril  20 mg Oral BH-q7a  . pantoprazole  40 mg Oral Daily    Objective: Vital signs in last 24 hours: Temp:  [97.3 F (36.3 C)-98.6 F (37 C)] 98.6 F (37 C) (06/28 0450) Pulse Rate:  [70-86] 70 (06/28 0450) Resp:  [16-18] 17 (06/28 0450) BP: (99-107)/(63-69) 104/68 mmHg (06/28 0450) SpO2:  [100 %] 100 % (06/28 0450) Physical Exam  Constitutional: He is oriented to person, place, and time. He appears well-developed and under nourished.older than stated age. No distress.  HENT:  Mouth/Throat: Oropharynx is clear and moist. No oropharyngeal exudate.  Cardiovascular: Normal rate, regular rhythm and normal heart sounds. Exam reveals no gallop and no friction rub.  No murmur heard.  Pulmonary/Chest: Effort normal and breath sounds normal. No respiratory distress. He has no wheezes.  Abdominal: Soft. Bowel sounds are normal. He exhibits no distension.  There is no tenderness.  Lymphadenopathy:  He has no cervical adenopathy.  Neurological: He is alert and oriented to person, place, and time.  Skin: Skin is warm and dry. Hypopigmentation to face and scalp Ext: left knee is casted but has a carved out window for patella/knee exposure for wound vac placement, tubing continues to have serosanginous fluid Psychiatric: He has a normal mood and affect. His behavior is normal.     Lab Results  Recent Labs  08/11/15 2040  WBC 8.3  HGB 9.4*  HCT 29.7*  NA 131*  K 4.3  CL 101  CO2 26  BUN 18  CREATININE 0.76   Lab Results  Component Value Date   ESRSEDRATE 75* 08/03/2015   Lab Results  Component Value Date   CRP 4.6* 08/03/2015     Microbiology: 6/15 OR cx =  Enterococcus (amp S) and enterobacter cloacae R- CTx, cefazolin, piptazo, ceftaz. S imi, cipro, bactrim Studies/Results: No results found.   Assessment/Plan: Enterobacter and enterococcal (amp S)Prosthetic joint infection s/p abtx spacer placement - recommend to be discharged on ertapenem 1gm IV q day plus vancomycin Iv daily dosing (will need vanco trough goal of 15-20) check on Saturday July 1st.  I have coordinated this with Dr. Percell Miller and [facility] as well as i/p pharmacist   we will see back in the ID clinic in mid to late July for follow up, in the meantime his labs will be sent to our office for    SNF  orders:  Diagnosis: Polymicrobial PJI  Culture Result: enterobacter, and enterococcus  No Known Allergies  Discharge antibiotics: Per pharmacy protocol  Ertapenem 1gm IV daily plus vancomycin  Duration: 6 wk End Date: July 23rd  PIC Care Per Protocol:  Labs weekly while on IV antibiotics: __x CBC with differential _x_ BMP twice a week _x_ CRP _x_ ESR _X_  vanco trough weekly   Fax weekly labs to [phone]  Clinic Follow Up Appt: [facility]  @ 3-4 wk with comer or snider    SNIDER, CYNTHIA [facility] Cell: [phone] Pager: [phone]  08/12/2015, 1:27 PM       
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
## 2015-08-12 NOTE — Progress Notes (Addendum)
Pharmacy Antibiotic Note  [name] is a 60 y.o. male admitted on 07/29/2015 s/p L-TKA on 07/07/15 complicated by a subsequent dehiscence and patella tendon rupture repaired on 07/19/15. The patient recently had a wound dehiscence requiring admission for reclosure of wound, I&D, washout, and placement of antibiotic spacer - done on 6/15. New intra-op cultures showed a more resistant strand of enterobacter and enterococcus. ID has requested once daily vancomycin for ease of administration as an outpatient. SCr 0.7, eCrCl > 60 ml/min.    Plan: -Continue Primaxin 500 mg IV q6h -Begin vancomycin 1500 mg IV q24h -Check VT at Css -Monitor renal fx, cultures, duration of therapy    Temp (24hrs), Avg:98 F (36.7 C), Min:97.3 F (36.3 C), Max:98.6 F (37 C)   Recent Labs Lab 08/06/15 0348 08/11/15 2040  WBC 8.4 8.3  CREATININE  --  0.76    CrCl cannot be calculated (Unknown ideal weight.).    No Known Allergies  Antimicrobials this admission: Primaxin 6/18>>6/23, 6/26 >> Ertapenem  6/23 >> 6/26 Vancomycin 6/28 >>  Dose adjustments this admission: NA  Microbiology results: 6/15 MRSA PCR: neg 6/15 WCx (intra-op): Enterobacter Cloacae and enterococcus (amp sensitive)   Thank you for allowing pharmacy to be a part of this patient's care.  Baldemar FridayMasters, [name] 08/12/2015 1:51 PM

## 2015-08-12 NOTE — Care Management Important Message (Signed)
Important Message  Patient Details  Name: [name] MRN: [id number] Date of Birth: 06/12/1955   Medicare Important Message Given:  Yes    Bernadette HoitShoffner, Richard Holz Coleman 08/12/2015, 10:12 AM

## 2015-08-17 ENCOUNTER — Non-Acute Institutional Stay (SKILLED_NURSING_FACILITY): Payer: Medicare Other | Admitting: Internal Medicine

## 2015-08-17 ENCOUNTER — Encounter: Payer: Self-pay | Admitting: Internal Medicine

## 2015-08-17 DIAGNOSIS — J45909 Unspecified asthma, uncomplicated: Secondary | ICD-10-CM

## 2015-08-17 DIAGNOSIS — D62 Acute posthemorrhagic anemia: Secondary | ICD-10-CM

## 2015-08-17 DIAGNOSIS — R531 Weakness: Secondary | ICD-10-CM | POA: Diagnosis not present

## 2015-08-17 DIAGNOSIS — Z96652 Presence of left artificial knee joint: Secondary | ICD-10-CM

## 2015-08-17 DIAGNOSIS — R200 Anesthesia of skin: Secondary | ICD-10-CM

## 2015-08-17 DIAGNOSIS — R208 Other disturbances of skin sensation: Secondary | ICD-10-CM

## 2015-08-17 DIAGNOSIS — I10 Essential (primary) hypertension: Secondary | ICD-10-CM

## 2015-08-17 DIAGNOSIS — J449 Chronic obstructive pulmonary disease, unspecified: Secondary | ICD-10-CM

## 2015-08-17 DIAGNOSIS — E871 Hypo-osmolality and hyponatremia: Secondary | ICD-10-CM

## 2015-08-17 DIAGNOSIS — T8450XS Infection and inflammatory reaction due to unspecified internal joint prosthesis, sequela: Secondary | ICD-10-CM | POA: Diagnosis not present

## 2015-08-17 DIAGNOSIS — J4489 Other specified chronic obstructive pulmonary disease: Secondary | ICD-10-CM
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
## 2015-08-28 LAB — CBC AND DIFFERENTIAL
HCT: 31 % — AB (ref 41–53)
Hemoglobin: 9.5 g/dL — AB (ref 13.5–17.5)
Neutrophils Absolute: 4 /uL
WBC: 6.5 10^3/mL

## 2015-08-28 LAB — POCT ERYTHROCYTE SEDIMENTATION RATE, NON-AUTOMATED: Sed Rate: 16 mm

## 2015-08-31 LAB — BASIC METABOLIC PANEL
BUN: 7 mg/dL (ref 4–21)
Creatinine: 0.6 mg/dL (ref 0.6–1.3)

## 2015-09-01 LAB — BASIC METABOLIC PANEL
BUN: 10 mg/dL (ref 4–21)
Creatinine: 0.7 mg/dL (ref 0.6–1.3)

## 2015-09-04 LAB — CBC AND DIFFERENTIAL
HCT: 28 % — AB (ref 41–53)
Hemoglobin: 8.7 g/dL — AB (ref 13.5–17.5)
Neutrophils Absolute: 4 /uL
Platelets: 295 10*3/uL (ref 150–399)
WBC: 6.2 10^3/mL

## 2015-09-15 ENCOUNTER — Other Ambulatory Visit: Payer: Self-pay

## 2015-09-15 ENCOUNTER — Encounter: Payer: Self-pay | Admitting: Internal Medicine

## 2015-09-15 ENCOUNTER — Ambulatory Visit (INDEPENDENT_AMBULATORY_CARE_PROVIDER_SITE_OTHER): Payer: Medicare Other | Admitting: Internal Medicine

## 2015-09-15 VITALS — BP 135/80 | HR 63 | Temp 97.8°F

## 2015-09-15 DIAGNOSIS — T8450XD Infection and inflammatory reaction due to unspecified internal joint prosthesis, subsequent encounter: Secondary | ICD-10-CM | POA: Diagnosis not present
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
## 2015-09-16 ENCOUNTER — Encounter: Payer: Self-pay | Admitting: Adult Health

## 2015-09-16 ENCOUNTER — Non-Acute Institutional Stay (SKILLED_NURSING_FACILITY): Payer: Medicare Other | Admitting: Adult Health

## 2015-09-16 DIAGNOSIS — D62 Acute posthemorrhagic anemia: Secondary | ICD-10-CM | POA: Diagnosis not present

## 2015-09-16 DIAGNOSIS — Z96652 Presence of left artificial knee joint: Secondary | ICD-10-CM

## 2015-09-16 DIAGNOSIS — J45909 Unspecified asthma, uncomplicated: Secondary | ICD-10-CM | POA: Diagnosis not present

## 2015-09-16 DIAGNOSIS — J4489 Other specified chronic obstructive pulmonary disease: Secondary | ICD-10-CM

## 2015-09-16 DIAGNOSIS — R531 Weakness: Secondary | ICD-10-CM

## 2015-09-16 DIAGNOSIS — I10 Essential (primary) hypertension: Secondary | ICD-10-CM

## 2015-09-16 DIAGNOSIS — T8450XS Infection and inflammatory reaction due to unspecified internal joint prosthesis, sequela: Secondary | ICD-10-CM | POA: Diagnosis not present

## 2015-09-16 DIAGNOSIS — J449 Chronic obstructive pulmonary disease, unspecified: Secondary | ICD-10-CM | POA: Diagnosis not present

## 2015-09-16 LAB — BASIC METABOLIC PANEL
BUN: 9 mg/dL (ref 7–25)
CO2: 26 mmol/L (ref 20–31)
Calcium: 9.7 mg/dL (ref 8.6–10.3)
Chloride: 102 mmol/L (ref 98–110)
Creat: 0.67 mg/dL — ABNORMAL LOW (ref 0.70–1.25)
Glucose, Bld: 80 mg/dL (ref 65–99)
Potassium: 5.1 mmol/L (ref 3.5–5.3)
Sodium: 137 mmol/L (ref 135–146)

## 2015-09-16 LAB — SEDIMENTATION RATE: Sed Rate: 1 mm/hr (ref 0–20)

## 2015-09-16 LAB — VANCOMYCIN, RANDOM: Vancomycin Rm: 17.5 mg/L

## 2015-09-16 LAB — C-REACTIVE PROTEIN: CRP: 0.5 mg/dL (ref <0.60)
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
## 2015-09-24 NOTE — H&P (Signed)
  [facility] [address]   [address] [place], [place] [id number] [phone] A Division of [facility]   RE: Henry FormosaRobinson, Juron   #[id number]  DOB: 03/21/1955 PROGRESS NOTE: 09-21-2015 Reason for visit:  Follow-up explant of a left total knee with antibiotic spacer placement.   History of present illness:  He continues his course of IV antibiotics.  He really does not have any functioning extensor mechanism.  Our plan going forward, after discussing the risks and benefits of surgery, is for a removal of his cement spacer on August 28th with fusion of his left knee.      Please see associated documentation for this clinic visit for further past medical, family, surgical and social history, review of systems, and exam findings as this was reviewed by me.  EXAMINATION: Well appearing male in no apparent distress.  His cast is intact.  His incision is healing quite well.  He has a very small punctate portion with a little bit of clear drainage, which is where he had previously had his large amount of drainage.  His skin is benign.  No surrounding erythema.    IMAGES: None today.      ASSESSMENT & PLAN: With no extensor mechanism, and his flexion contracture, I discussed that replanting would be very unlikely to be successful.  Once he has completed his course of IV antibiotics our next step is to remove his cement spacer and do a primary fusion of his knee.  We have this scheduled for August 28th.  He understands the risks and benefits of this.  He is working on smoking cessation.    Jewel Baizeimothy D.  Eulah PontMurphy, M.D.  Electronically verified by Jewel Baizeimothy D. Eulah PontMurphy, M.D. TDM: Gwenyth Allegrajkg  Cc:  Gean BirchwoodFrank Rowan, MD    D: 09-21-2015 T: 09-23-2015

## 2015-09-29 NOTE — Pre-Procedure Instructions (Signed)
Neomia DearWillie J Geske  09/29/2015      [facility] [phone] - [place] - [address] AT [facility] [address] [facility] [address] [place] [address] Phone: [phone] Fax: [phone]  [id number] - [place], [place] - [address] AT [facility] [address] [place] [id number] Phone: [phone] Fax: [phone]    Your procedure is scheduled on Mon, Aug 28 @ 3:15 PM  [facility] Admitting at [employer]:15 AM  Call this number if you have problems the morning of surgery:  [phone]   Remember:  Do not eat food or drink liquids after midnight.  Take these medicines the morning of surgery with A SIP OF WATER Albuterol<Bring Your Inhaler With You> and Pain Pill(if needed)                No Goody's,BC's,Aleve,Advil,Motrin,Ibuprofen,Fish Oil,or any Herbal Medications a week before surgery   Do not wear jewelry.  Do not wear lotions, powders, or colognes.             Men may shave face and neck.  Do not bring valuables to the hospital.  [facility] is not responsible for any belongings or valuables.  Contacts, dentures or bridgework may not be worn into surgery.  Leave your suitcase in the car.  After surgery it may be brought to your room.  For patients admitted to the hospital, discharge time will be determined by your treatment team.  Patients discharged the day of surgery will not be allowed to drive home.   [facility] - Preparing for [facility]  Before surgery, you can play an important role.  Because skin is not sterile, your skin needs to be as free of germs as possible.  You can reduce the number of germs on you skin by washing with CHG (chlorahexidine gluconate) soap before surgery.  CHG is an antiseptic cleaner which kills germs and bonds with the skin to continue killing germs even after washing.  Please DO NOT use if you have an allergy to CHG or antibacterial  soaps.  If your skin becomes reddened/irritated stop using the CHG and inform your nurse when you arrive at Short Stay.  Do not shave (including legs and underarms) for at least 48 hours prior to the first CHG shower.  You may shave your face.  Please follow these instructions carefully:   1.  Shower with CHG Soap the night before surgery and the                                morning of Surgery.  2.  If you choose to wash your hair, wash your hair first as usual with your       normal shampoo.  3.  After you shampoo, rinse your hair and body thoroughly to remove the                      Shampoo.  4.  Use CHG as you would any other liquid soap.  You can apply chg directly       to the skin and wash gently with scrungie or a clean washcloth.  5.  Apply the CHG Soap to your body ONLY FROM THE NECK DOWN.        Do not use on open wounds or open sores.  Avoid contact with your eyes,       ears, mouth and genitals (private parts).  Wash genitals (private parts)       with your normal soap.  6.  Wash thoroughly, paying special attention to the area where your surgery        will be performed.  7.  Thoroughly rinse your body with warm water from the neck down.  8.  DO NOT shower/wash with your normal soap after using and rinsing off       the CHG Soap.  9.  Pat yourself dry with a clean towel.            10.  Wear clean pajamas.            11.  Place clean sheets on your bed the night of your first shower and do not        sleep with pets.  Day of Surgery  Do not apply any lotions/deoderants the morning of surgery.  Please wear clean clothes to the hospital/surgery center.  :    Please read over the following fact sheets that you were given. Pain Booklet, Coughing and Deep Breathing and MRSA Information

## 2015-09-30 ENCOUNTER — Other Ambulatory Visit: Payer: Self-pay

## 2015-09-30 ENCOUNTER — Encounter (HOSPITAL_COMMUNITY)
Admission: RE | Admit: 2015-09-30 | Discharge: 2015-09-30 | Disposition: A | Discharge status: Home / Self Care | Payer: Medicare Other | Source: Ambulatory Visit | Attending: Orthopedic Surgery | Admitting: Orthopedic Surgery

## 2015-09-30 ENCOUNTER — Encounter (HOSPITAL_COMMUNITY): Payer: Self-pay

## 2015-09-30 DIAGNOSIS — Z9981 Dependence on supplemental oxygen: Secondary | ICD-10-CM | POA: Diagnosis not present

## 2015-09-30 DIAGNOSIS — J449 Chronic obstructive pulmonary disease, unspecified: Secondary | ICD-10-CM | POA: Insufficient documentation

## 2015-09-30 DIAGNOSIS — F17200 Nicotine dependence, unspecified, uncomplicated: Secondary | ICD-10-CM | POA: Insufficient documentation

## 2015-09-30 DIAGNOSIS — Z01818 Encounter for other preprocedural examination: Secondary | ICD-10-CM | POA: Diagnosis not present

## 2015-09-30 DIAGNOSIS — I10 Essential (primary) hypertension: Secondary | ICD-10-CM | POA: Diagnosis not present

## 2015-09-30 DIAGNOSIS — K219 Gastro-esophageal reflux disease without esophagitis: Secondary | ICD-10-CM | POA: Insufficient documentation

## 2015-09-30 DIAGNOSIS — Z01812 Encounter for preprocedural laboratory examination: Secondary | ICD-10-CM | POA: Diagnosis not present

## 2015-09-30 DIAGNOSIS — Z79899 Other long term (current) drug therapy: Secondary | ICD-10-CM | POA: Diagnosis not present

## 2015-09-30 LAB — CBC
HCT: 36.4 % — ABNORMAL LOW (ref 39.0–52.0)
Hemoglobin: 11.1 g/dL — ABNORMAL LOW (ref 13.0–17.0)
MCH: 27.4 pg (ref 26.0–34.0)
MCHC: 30.5 g/dL (ref 30.0–36.0)
MCV: 89.9 fL (ref 78.0–100.0)
Platelets: 245 10*3/uL (ref 150–400)
RBC: 4.05 MIL/uL — ABNORMAL LOW (ref 4.22–5.81)
RDW: 16.9 % — ABNORMAL HIGH (ref 11.5–15.5)
WBC: 6.5 10*3/uL (ref 4.0–10.5)

## 2015-09-30 LAB — BASIC METABOLIC PANEL
Anion gap: 7 (ref 5–15)
BUN: 9 mg/dL (ref 6–20)
CO2: 25 mmol/L (ref 22–32)
Calcium: 9.7 mg/dL (ref 8.9–10.3)
Chloride: 103 mmol/L (ref 101–111)
Creatinine, Ser: 0.65 mg/dL (ref 0.61–1.24)
GFR calc Af Amer: >60 mL/min (ref >60)
GFR calc non Af Amer: >60 mL/min (ref >60)
Glucose, Bld: 77 mg/dL (ref 65–99)
Potassium: 4.3 mmol/L (ref 3.5–5.1)
Sodium: 135 mmol/L (ref 135–145)

## 2015-09-30 LAB — SURGICAL PCR SCREEN
MRSA, PCR: NEGATIVE
Staphylococcus aureus: NEGATIVE

## 2015-09-30 MED ORDER — CHLORHEXIDINE GLUCONATE 4 % EX LIQD: 60.0000 mL | Freq: Once | CUTANEOUS | Status: DC

## 2015-09-30 NOTE — Progress Notes (Signed)
[facility] giving the same instructions to her that I gave to him.I also sent instructions with pt to give back to the facility.

## 2015-09-30 NOTE — Progress Notes (Addendum)
Cardiologist denies  Medical Md is Dr.Edwin Avbuere  Echo denies  Stress test denies  Heart cath denies  EKG last one July 2016  CXR denies

## 2015-10-01 NOTE — Progress Notes (Signed)
Anesthesia Chart Review:  Pt is a 60 year old male scheduled for L knee arthrodesis on 10/12/2015 with Margarita Ranaimothy Murphy, MD.   PCP is Dr. Fleet ContrasEdwin Avbuere however pt [facility], and their are [facility].   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 Current smoker. BMI 22. S/p incision and drainage, excisional L TKA with antibiotic spacers 07/30/15. S/p L TKA 07/07/15.   Medications include: albuterol, ertapenem, lisinopril, vancomycin  Preoperative labs reviewed.  T&S will need to be obtained DOS as pt had transfusion 07/2015.   Chest x-ray 06/26/15 reviewed. No active cardiopulmonary disease.   EKG 09/30/15: NSR. Anterior infarct, age undetermined  If no changes, I anticipate pt can proceed with surgery as scheduled.   Rica Mastngela Elmond Poehlman, FNP-BC [facility]/Anesthesiology Phone: [phone] 10/01/2015 4:05 PM

## 2015-10-09 MED ORDER — ACETAMINOPHEN 500 MG PO TABS
1000.0000 mg | ORAL_TABLET | Freq: Once | ORAL | Status: AC
  Administered 2015-10-12: 1000 mg via ORAL
  Filled 2015-10-09: qty 2

## 2015-10-09 MED ORDER — LACTATED RINGERS IV SOLN
INTRAVENOUS | Status: DC
  Administered 2015-10-12: 14:00:00 via INTRAVENOUS

## 2015-10-09 MED ORDER — VANCOMYCIN HCL IN DEXTROSE 1-5 GM/200ML-% IV SOLN
1000.0000 mg | INTRAVENOUS | Status: AC
  Administered 2015-10-12: 1000 mg via INTRAVENOUS
  Filled 2015-10-09: qty 200

## 2015-10-09 MED ORDER — TRANEXAMIC ACID 1000 MG/10ML IV SOLN
1000.0000 mg | INTRAVENOUS | Status: DC
  Filled 2015-10-09: qty 10

## 2015-10-12 ENCOUNTER — Encounter (HOSPITAL_COMMUNITY): Payer: Self-pay | Admitting: General Practice

## 2015-10-12 ENCOUNTER — Inpatient Hospital Stay (HOSPITAL_COMMUNITY): Payer: Medicare Other

## 2015-10-12 ENCOUNTER — Inpatient Hospital Stay (HOSPITAL_COMMUNITY): Payer: Medicare Other | Admitting: Emergency Medicine

## 2015-10-12 ENCOUNTER — Encounter (HOSPITAL_COMMUNITY)
Admission: RE | Disposition: A | Discharge status: Skilled Nursing Facility | Payer: Self-pay | Source: Ambulatory Visit | Attending: Orthopedic Surgery

## 2015-10-12 ENCOUNTER — Inpatient Hospital Stay (HOSPITAL_COMMUNITY)
Admission: RE | Admit: 2015-10-12 | Discharge: 2015-10-20 | DRG: 486 | Disposition: A | Discharge status: Skilled Nursing Facility | Payer: Medicare Other | Source: Ambulatory Visit | Attending: Orthopedic Surgery | Admitting: Orthopedic Surgery

## 2015-10-12 ENCOUNTER — Inpatient Hospital Stay (HOSPITAL_COMMUNITY): Payer: Medicare Other | Admitting: Anesthesiology

## 2015-10-12 DIAGNOSIS — K219 Gastro-esophageal reflux disease without esophagitis: Secondary | ICD-10-CM | POA: Diagnosis not present

## 2015-10-12 DIAGNOSIS — J4489 Other specified chronic obstructive pulmonary disease: Secondary | ICD-10-CM | POA: Diagnosis present

## 2015-10-12 DIAGNOSIS — Z452 Encounter for adjustment and management of vascular access device: Secondary | ICD-10-CM

## 2015-10-12 DIAGNOSIS — T8131XA Disruption of external operation (surgical) wound, not elsewhere classified, initial encounter: Secondary | ICD-10-CM | POA: Diagnosis present

## 2015-10-12 DIAGNOSIS — T8781 Dehiscence of amputation stump: Secondary | ICD-10-CM | POA: Diagnosis present

## 2015-10-12 DIAGNOSIS — T8450XA Infection and inflammatory reaction due to unspecified internal joint prosthesis, initial encounter: Secondary | ICD-10-CM | POA: Diagnosis present

## 2015-10-12 DIAGNOSIS — T8454XD Infection and inflammatory reaction due to internal left knee prosthesis, subsequent encounter: Secondary | ICD-10-CM | POA: Diagnosis not present

## 2015-10-12 DIAGNOSIS — R569 Unspecified convulsions: Secondary | ICD-10-CM | POA: Diagnosis not present

## 2015-10-12 DIAGNOSIS — I10 Essential (primary) hypertension: Secondary | ICD-10-CM | POA: Diagnosis not present

## 2015-10-12 DIAGNOSIS — T8454XA Infection and inflammatory reaction due to internal left knee prosthesis, initial encounter: Secondary | ICD-10-CM | POA: Diagnosis not present

## 2015-10-12 DIAGNOSIS — A499 Bacterial infection, unspecified: Secondary | ICD-10-CM | POA: Diagnosis present

## 2015-10-12 DIAGNOSIS — M1710 Unilateral primary osteoarthritis, unspecified knee: Secondary | ICD-10-CM | POA: Diagnosis present

## 2015-10-12 DIAGNOSIS — S86812A Strain of other muscle(s) and tendon(s) at lower leg level, left leg, initial encounter: Secondary | ICD-10-CM | POA: Diagnosis present

## 2015-10-12 DIAGNOSIS — M00862 Arthritis due to other bacteria, left knee: Secondary | ICD-10-CM | POA: Diagnosis not present

## 2015-10-12 DIAGNOSIS — Z981 Arthrodesis status: Secondary | ICD-10-CM

## 2015-10-12 DIAGNOSIS — Y792 Prosthetic and other implants, materials and accessory orthopedic devices associated with adverse incidents: Secondary | ICD-10-CM | POA: Diagnosis not present

## 2015-10-12 DIAGNOSIS — Z96652 Presence of left artificial knee joint: Secondary | ICD-10-CM | POA: Diagnosis present

## 2015-10-12 DIAGNOSIS — N179 Acute kidney failure, unspecified: Secondary | ICD-10-CM | POA: Diagnosis not present

## 2015-10-12 DIAGNOSIS — S76112A Strain of left quadriceps muscle, fascia and tendon, initial encounter: Secondary | ICD-10-CM | POA: Diagnosis not present

## 2015-10-12 DIAGNOSIS — D62 Acute posthemorrhagic anemia: Secondary | ICD-10-CM | POA: Diagnosis not present

## 2015-10-12 DIAGNOSIS — Z720 Tobacco use: Secondary | ICD-10-CM | POA: Diagnosis present

## 2015-10-12 DIAGNOSIS — B9689 Other specified bacterial agents as the cause of diseases classified elsewhere: Secondary | ICD-10-CM | POA: Diagnosis not present

## 2015-10-12 DIAGNOSIS — T8130XA Disruption of wound, unspecified, initial encounter: Secondary | ICD-10-CM | POA: Diagnosis present

## 2015-10-12 DIAGNOSIS — F17210 Nicotine dependence, cigarettes, uncomplicated: Secondary | ICD-10-CM | POA: Diagnosis not present

## 2015-10-12 DIAGNOSIS — J449 Chronic obstructive pulmonary disease, unspecified: Secondary | ICD-10-CM | POA: Diagnosis not present

## 2015-10-12 DIAGNOSIS — S86819A Strain of other muscle(s) and tendon(s) at lower leg level, unspecified leg, initial encounter: Secondary | ICD-10-CM | POA: Diagnosis present

## 2015-10-12 DIAGNOSIS — M1712 Unilateral primary osteoarthritis, left knee: Secondary | ICD-10-CM | POA: Diagnosis present

## 2015-10-12 DIAGNOSIS — M179 Osteoarthritis of knee, unspecified: Secondary | ICD-10-CM | POA: Diagnosis present

## 2015-10-12 HISTORY — PX: KNEE ARTHRODESIS: SHX1857

## 2015-10-12 LAB — GLUCOSE, CAPILLARY: Glucose-Capillary: 117 mg/dL — ABNORMAL HIGH (ref 65–99)

## 2015-10-12 SURGERY — FUSION, JOINT, KNEE
Anesthesia: Regional | Laterality: Left

## 2015-10-12 MED ORDER — ASPIRIN 325 MG PO TABS
325.0000 mg | ORAL_TABLET | Freq: Every day | ORAL | Status: DC
  Administered 2015-10-12 – 2015-10-20 (×9): 325 mg via ORAL
  Filled 2015-10-12 (×9): qty 1

## 2015-10-12 MED ORDER — FENTANYL CITRATE (PF) 100 MCG/2ML IJ SOLN
INTRAMUSCULAR | Status: AC
  Filled 2015-10-12: qty 2

## 2015-10-12 MED ORDER — DOCUSATE SODIUM 100 MG PO CAPS
100.0000 mg | ORAL_CAPSULE | Freq: Two times a day (BID) | ORAL | Status: DC
  Administered 2015-10-13 – 2015-10-20 (×15): 100 mg via ORAL
  Filled 2015-10-12 (×16): qty 1

## 2015-10-12 MED ORDER — HYDROMORPHONE HCL 1 MG/ML IJ SOLN
INTRAMUSCULAR | Status: AC
  Administered 2015-10-12: 0.5 mg via INTRAVENOUS
  Filled 2015-10-12: qty 1

## 2015-10-12 MED ORDER — HYDROMORPHONE HCL 1 MG/ML IJ SOLN
0.2500 mg | INTRAMUSCULAR | Status: DC | PRN
  Administered 2015-10-12 (×4): 0.5 mg via INTRAVENOUS

## 2015-10-12 MED ORDER — PROMETHAZINE HCL 25 MG/ML IJ SOLN: 6.2500 mg | INTRAMUSCULAR | Status: DC | PRN

## 2015-10-12 MED ORDER — LIDOCAINE 2% (20 MG/ML) 5 ML SYRINGE
INTRAMUSCULAR | Status: AC
  Filled 2015-10-12: qty 5

## 2015-10-12 MED ORDER — VANCOMYCIN HCL IN NACL 1-0.9 GM/200ML-% IV SOLN: 1000.0000 mg | Freq: Three times a day (TID) | INTRAVENOUS | Status: DC

## 2015-10-12 MED ORDER — ACETAMINOPHEN 650 MG RE SUPP: 650.0000 mg | Freq: Four times a day (QID) | RECTAL | Status: DC | PRN

## 2015-10-12 MED ORDER — OXYCODONE HCL 5 MG PO TABS: 5.0000 mg | ORAL_TABLET | Freq: Once | ORAL | Status: DC | PRN

## 2015-10-12 MED ORDER — METOCLOPRAMIDE HCL 5 MG/ML IJ SOLN: 5.0000 mg | Freq: Three times a day (TID) | INTRAMUSCULAR | Status: DC | PRN

## 2015-10-12 MED ORDER — PHENYLEPHRINE 40 MCG/ML (10ML) SYRINGE FOR IV PUSH (FOR BLOOD PRESSURE SUPPORT)
PREFILLED_SYRINGE | INTRAVENOUS | Status: AC
  Filled 2015-10-12: qty 20

## 2015-10-12 MED ORDER — VANCOMYCIN HCL IN DEXTROSE 1-5 GM/200ML-% IV SOLN
1000.0000 mg | Freq: Three times a day (TID) | INTRAVENOUS | Status: DC
  Administered 2015-10-12 – 2015-10-14 (×6): 1000 mg via INTRAVENOUS
  Filled 2015-10-12 (×8): qty 200

## 2015-10-12 MED ORDER — ALBUTEROL SULFATE (2.5 MG/3ML) 0.083% IN NEBU
3.0000 mL | INHALATION_SOLUTION | Freq: Four times a day (QID) | RESPIRATORY_TRACT | Status: DC | PRN
  Administered 2015-10-13: 3 mL via RESPIRATORY_TRACT
  Filled 2015-10-12: qty 3

## 2015-10-12 MED ORDER — BUPIVACAINE HCL (PF) 0.25 % IJ SOLN
INTRAMUSCULAR | Status: AC
  Filled 2015-10-12: qty 30

## 2015-10-12 MED ORDER — ROCURONIUM BROMIDE 10 MG/ML (PF) SYRINGE
PREFILLED_SYRINGE | INTRAVENOUS | Status: AC
  Filled 2015-10-12: qty 10

## 2015-10-12 MED ORDER — METOCLOPRAMIDE HCL 5 MG PO TABS: 5.0000 mg | ORAL_TABLET | Freq: Three times a day (TID) | ORAL | Status: DC | PRN

## 2015-10-12 MED ORDER — EPHEDRINE SULFATE 50 MG/ML IJ SOLN
INTRAMUSCULAR | Status: DC | PRN
  Administered 2015-10-12: 10 mg via INTRAVENOUS

## 2015-10-12 MED ORDER — OXYCODONE HCL 5 MG PO TABS
5.0000 mg | ORAL_TABLET | ORAL | Status: DC | PRN
  Administered 2015-10-12 – 2015-10-13 (×3): 10 mg via ORAL
  Filled 2015-10-12 (×2): qty 2

## 2015-10-12 MED ORDER — OXYCODONE HCL 5 MG/5ML PO SOLN: 5.0000 mg | Freq: Once | ORAL | Status: DC | PRN

## 2015-10-12 MED ORDER — SUGAMMADEX SODIUM 200 MG/2ML IV SOLN
INTRAVENOUS | Status: AC
  Filled 2015-10-12: qty 2

## 2015-10-12 MED ORDER — FENTANYL CITRATE (PF) 100 MCG/2ML IJ SOLN
INTRAMUSCULAR | Status: DC | PRN
  Administered 2015-10-12: 25 ug via INTRAVENOUS
  Administered 2015-10-12: 100 ug via INTRAVENOUS
  Administered 2015-10-12: 50 ug via INTRAVENOUS
  Administered 2015-10-12: 100 ug via INTRAVENOUS
  Administered 2015-10-12: 25 ug via INTRAVENOUS
  Administered 2015-10-12: 100 ug via INTRAVENOUS

## 2015-10-12 MED ORDER — LIDOCAINE HCL (CARDIAC) 20 MG/ML IV SOLN
INTRAVENOUS | Status: DC | PRN
  Administered 2015-10-12: 50 mg via INTRAVENOUS

## 2015-10-12 MED ORDER — ROCURONIUM BROMIDE 100 MG/10ML IV SOLN
INTRAVENOUS | Status: DC | PRN
  Administered 2015-10-12: 50 mg via INTRAVENOUS

## 2015-10-12 MED ORDER — PHENYLEPHRINE HCL 10 MG/ML IJ SOLN
INTRAMUSCULAR | Status: DC | PRN
  Administered 2015-10-12 (×2): 40 ug via INTRAVENOUS
  Administered 2015-10-12: 80 ug via INTRAVENOUS

## 2015-10-12 MED ORDER — SODIUM CHLORIDE 0.9 % IV SOLN
1.0000 g | INTRAVENOUS | Status: DC
  Administered 2015-10-13 – 2015-10-20 (×8): 1 g via INTRAVENOUS
  Filled 2015-10-12 (×10): qty 1

## 2015-10-12 MED ORDER — PROPOFOL 10 MG/ML IV BOLUS
INTRAVENOUS | Status: AC
  Filled 2015-10-12: qty 20

## 2015-10-12 MED ORDER — ONDANSETRON HCL 4 MG PO TABS: 4.0000 mg | ORAL_TABLET | Freq: Four times a day (QID) | ORAL | Status: DC | PRN

## 2015-10-12 MED ORDER — SODIUM CHLORIDE 0.9 % IV SOLN
INTRAVENOUS | Status: AC
  Administered 2015-10-12: 21:00:00 via INTRAVENOUS

## 2015-10-12 MED ORDER — MIDAZOLAM HCL 5 MG/5ML IJ SOLN: INTRAMUSCULAR | Status: DC | PRN

## 2015-10-12 MED ORDER — SUGAMMADEX SODIUM 200 MG/2ML IV SOLN
INTRAVENOUS | Status: DC | PRN
  Administered 2015-10-12: 128 mg via INTRAVENOUS

## 2015-10-12 MED ORDER — LISINOPRIL 20 MG PO TABS
20.0000 mg | ORAL_TABLET | ORAL | Status: DC
  Administered 2015-10-13 – 2015-10-20 (×8): 20 mg via ORAL
  Filled 2015-10-12 (×8): qty 1

## 2015-10-12 MED ORDER — MORPHINE SULFATE (PF) 2 MG/ML IV SOLN
2.0000 mg | INTRAVENOUS | Status: DC | PRN
  Administered 2015-10-12 – 2015-10-18 (×25): 2 mg via INTRAVENOUS
  Filled 2015-10-12 (×26): qty 1

## 2015-10-12 MED ORDER — DEXTROSE 5 % IV SOLN
500.0000 mg | Freq: Four times a day (QID) | INTRAVENOUS | Status: DC | PRN
  Administered 2015-10-16 – 2015-10-18 (×3): 500 mg via INTRAVENOUS
  Filled 2015-10-12 (×10): qty 5

## 2015-10-12 MED ORDER — ONDANSETRON HCL 4 MG/2ML IJ SOLN
INTRAMUSCULAR | Status: DC | PRN
  Administered 2015-10-12: 4 mg via INTRAVENOUS

## 2015-10-12 MED ORDER — ACETAMINOPHEN 325 MG PO TABS
650.0000 mg | ORAL_TABLET | Freq: Four times a day (QID) | ORAL | Status: DC | PRN
  Administered 2015-10-12 – 2015-10-14 (×2): 650 mg via ORAL
  Filled 2015-10-12: qty 2

## 2015-10-12 MED ORDER — TRANEXAMIC ACID 1000 MG/10ML IV SOLN
2000.0000 mg | Freq: Once | INTRAVENOUS | Status: DC
  Filled 2015-10-12: qty 20

## 2015-10-12 MED ORDER — ONDANSETRON HCL 4 MG/2ML IJ SOLN: 4.0000 mg | Freq: Four times a day (QID) | INTRAMUSCULAR | Status: DC | PRN

## 2015-10-12 MED ORDER — MIDAZOLAM HCL 5 MG/5ML IJ SOLN
INTRAMUSCULAR | Status: DC | PRN
  Administered 2015-10-12: 2 mg via INTRAVENOUS

## 2015-10-12 MED ORDER — LACTATED RINGERS IV SOLN
INTRAVENOUS | Status: DC | PRN
  Administered 2015-10-12 (×3): via INTRAVENOUS

## 2015-10-12 MED ORDER — METHOCARBAMOL 500 MG PO TABS
ORAL_TABLET | ORAL | Status: AC
  Administered 2015-10-12: 500 mg via ORAL
  Filled 2015-10-12: qty 1

## 2015-10-12 MED ORDER — SODIUM CHLORIDE 0.9 % IR SOLN
Status: DC | PRN
  Administered 2015-10-12: 1000 mL
  Administered 2015-10-12: 3000 mL

## 2015-10-12 MED ORDER — PROPOFOL 10 MG/ML IV BOLUS
INTRAVENOUS | Status: DC | PRN
  Administered 2015-10-12: 30 mg via INTRAVENOUS
  Administered 2015-10-12: 150 mg via INTRAVENOUS

## 2015-10-12 MED ORDER — TRANEXAMIC ACID 1000 MG/10ML IV SOLN
INTRAVENOUS | Status: DC | PRN
  Administered 2015-10-12: 2000 mg via TOPICAL

## 2015-10-12 MED ORDER — EPHEDRINE 5 MG/ML INJ
INTRAVENOUS | Status: AC
  Filled 2015-10-12: qty 10

## 2015-10-12 MED ORDER — METHOCARBAMOL 500 MG PO TABS
500.0000 mg | ORAL_TABLET | Freq: Four times a day (QID) | ORAL | Status: DC | PRN
  Administered 2015-10-12 – 2015-10-20 (×18): 500 mg via ORAL
  Filled 2015-10-12 (×19): qty 1

## 2015-10-12 MED ORDER — OXYCODONE HCL 5 MG PO TABS
ORAL_TABLET | ORAL | Status: AC
  Administered 2015-10-12: 10 mg via ORAL
  Filled 2015-10-12: qty 2

## 2015-10-12 MED ORDER — MIDAZOLAM HCL 2 MG/2ML IJ SOLN
INTRAMUSCULAR | Status: AC
  Filled 2015-10-12: qty 2

## 2015-10-12 SURGICAL SUPPLY — 64 items
BANDAGE ELASTIC 6 VELCRO ST LF (GAUZE/BANDAGES/DRESSINGS) ×2 IMPLANT
BANDAGE ESMARK 6X9 LF (GAUZE/BANDAGES/DRESSINGS) ×1 IMPLANT
BLADE SAG 18X100X1.27 (BLADE) ×2 IMPLANT
BNDG COHESIVE 6X5 TAN STRL LF (GAUZE/BANDAGES/DRESSINGS) ×2 IMPLANT
BNDG ESMARK 6X9 LF (GAUZE/BANDAGES/DRESSINGS) ×2
BONE CEMENT PALACOS R-G (Orthopedic Implant) ×8 IMPLANT
BOWL SMART MIX CTS (DISPOSABLE) ×2 IMPLANT
CEMENT BONE PALACOS R-G (Orthopedic Implant) ×4 IMPLANT
CEMENT RESTRICTOR DEPUY SZ 4 (Cement) ×4 IMPLANT
CLSR STERI-STRIP ANTIMIC 1/2X4 (GAUZE/BANDAGES/DRESSINGS) ×4 IMPLANT
COVER SURGICAL LIGHT HANDLE (MISCELLANEOUS) ×2 IMPLANT
CUFF TOURNIQUET SINGLE 34IN LL (TOURNIQUET CUFF) ×2 IMPLANT
DRAPE EXTREMITY T 121X128X90 (DRAPE) ×2 IMPLANT
DRAPE U-SHAPE 47X51 STRL (DRAPES) ×2 IMPLANT
DRSG ADAPTIC 3X8 NADH LF (GAUZE/BANDAGES/DRESSINGS) ×2 IMPLANT
DRSG MEPILEX BORDER 4X8 (GAUZE/BANDAGES/DRESSINGS) IMPLANT
DRSG PAD ABDOMINAL 8X10 ST (GAUZE/BANDAGES/DRESSINGS) ×2 IMPLANT
DURAPREP 26ML APPLICATOR (WOUND CARE) ×4 IMPLANT
ELECT CAUTERY BLADE 6.4 (BLADE) ×2 IMPLANT
ELECT REM PT RETURN 9FT ADLT (ELECTROSURGICAL) ×2
ELECTRODE REM PT RTRN 9FT ADLT (ELECTROSURGICAL) ×1 IMPLANT
EVACUATOR 1/8 PVC DRAIN (DRAIN) ×2 IMPLANT
FACESHIELD WRAPAROUND (MASK) ×6 IMPLANT
GAUZE SPONGE 4X4 12PLY STRL (GAUZE/BANDAGES/DRESSINGS) ×2 IMPLANT
GLOVE BIO SURGEON STRL SZ7.5 (GLOVE) ×4 IMPLANT
GLOVE BIOGEL PI IND STRL 8 (GLOVE) ×2 IMPLANT
GLOVE BIOGEL PI INDICATOR 8 (GLOVE) ×2
GOWN STRL REUS W/ TWL LRG LVL3 (GOWN DISPOSABLE) ×2 IMPLANT
GOWN STRL REUS W/ TWL XL LVL3 (GOWN DISPOSABLE) ×1 IMPLANT
GOWN STRL REUS W/TWL LRG LVL3 (GOWN DISPOSABLE) ×2
GOWN STRL REUS W/TWL XL LVL3 (GOWN DISPOSABLE) ×1
HANDPIECE INTERPULSE COAX TIP (DISPOSABLE) ×1
IMMOBILIZER KNEE 22 UNIV (SOFTGOODS) ×2 IMPLANT
IMMOBILIZER KNEE 24 THIGH 36 (MISCELLANEOUS) IMPLANT
IMMOBILIZER KNEE 24 UNIV (MISCELLANEOUS)
KIT BASIN OR (CUSTOM PROCEDURE TRAY) ×2 IMPLANT
KIT ROOM TURNOVER OR (KITS) ×2 IMPLANT
LIQUID BAND (GAUZE/BANDAGES/DRESSINGS) IMPLANT
MANIFOLD NEPTUNE II (INSTRUMENTS) ×2 IMPLANT
NAIL ATHRODESIS W LOCK SCREW (Nail) ×4 IMPLANT
NAIL ATHRODESIS W/LOCK BOLTS 7 (Nail) ×2 IMPLANT
NEEDLE 18GX1X1/2 (RX/OR ONLY) (NEEDLE) ×2 IMPLANT
NS IRRIG 1000ML POUR BTL (IV SOLUTION) ×2 IMPLANT
PACK TOTAL JOINT (CUSTOM PROCEDURE TRAY) ×2 IMPLANT
PACK UNIVERSAL I (CUSTOM PROCEDURE TRAY) ×2 IMPLANT
PAD ARMBOARD 7.5X6 YLW CONV (MISCELLANEOUS) ×2 IMPLANT
SET HNDPC FAN SPRY TIP SCT (DISPOSABLE) ×1 IMPLANT
STAPLER VISISTAT 35W (STAPLE) IMPLANT
STEM OSS STRT SHLD 11X150 (Screw) ×2 IMPLANT
STEM STRGHT IM W/SCREW 13MM (Stem) ×2 IMPLANT
SUCTION FRAZIER HANDLE 10FR (MISCELLANEOUS) ×1
SUCTION TUBE FRAZIER 10FR DISP (MISCELLANEOUS) ×1 IMPLANT
SUT ETHILON 3 0 FSL (SUTURE) ×6 IMPLANT
SUT MNCRL AB 4-0 PS2 18 (SUTURE) ×2 IMPLANT
SUT MON AB 2-0 CT1 27 (SUTURE) ×4 IMPLANT
SUT VIC AB 0 CT1 27 (SUTURE) ×1
SUT VIC AB 0 CT1 27XBRD ANBCTR (SUTURE) ×1 IMPLANT
SUT VIC AB 1 CTX 36 (SUTURE) ×1
SUT VIC AB 1 CTX36XBRD ANBCTR (SUTURE) ×1 IMPLANT
SYR 50ML LL SCALE MARK (SYRINGE) ×2 IMPLANT
TOWEL OR 17X24 6PK STRL BLUE (TOWEL DISPOSABLE) ×2 IMPLANT
TOWEL OR 17X26 10 PK STRL BLUE (TOWEL DISPOSABLE) ×2 IMPLANT
TOWER CARTRIDGE SMART MIX (DISPOSABLE) ×4 IMPLANT
TRAY FOLEY BAG SILVER LF 14FR (CATHETERS) IMPLANT

## 2015-10-12 NOTE — Anesthesia Preprocedure Evaluation (Addendum)
Anesthesia Evaluation  Patient identified by MRN, date of birth, ID band Patient awake    Reviewed: Allergy & Precautions, H&P , NPO status , Patient's Chart, lab work & pertinent test results  History of Anesthesia Complications Negative for: history of anesthetic complications  Airway Mallampati: II  TM Distance: >3 FB Neck ROM: full    Dental  (+) Edentulous Upper, Missing, Dental Advisory Given   Pulmonary asthma , COPD, Current Smoker,    Pulmonary exam normal breath sounds clear to auscultation       Cardiovascular hypertension, negative cardio ROS Normal cardiovascular exam Rhythm:regular Rate:Normal     Neuro/Psych Seizures -, Well Controlled,  From alcohol abuse    GI/Hepatic negative GI ROS, GERD  ,(+)     substance abuse  alcohol use, Substance abuse in past   Endo/Other  negative endocrine ROS  Renal/GU negative Renal ROS     Musculoskeletal  (+) Arthritis ,   Abdominal   Peds  Hematology negative hematology ROS (+)   Anesthesia Other Findings   Reproductive/Obstetrics negative OB ROS                            Anesthesia Physical  Anesthesia Plan  ASA: II  Anesthesia Plan: General and Regional   Post-op Pain Management:    Induction: Intravenous  Airway Management Planned: Oral ETT  Additional Equipment:   Intra-op Plan:   Post-operative Plan: Extubation in OR  Informed Consent: I have reviewed the patients History and Physical, chart, labs and discussed the procedure including the risks, benefits and alternatives for the proposed anesthesia with the patient or authorized representative who has indicated his/her understanding and acceptance.   Dental Advisory Given  Plan Discussed with: Anesthesiologist, CRNA and Surgeon  Anesthesia Plan Comments:         Anesthesia Quick Evaluation

## 2015-10-12 NOTE — Progress Notes (Signed)
Pharmacy Antibiotic Note  [name] is a 60 y.o. male admitted on 10/12/2015 with L TKA infection s/p surgical explant and OR today for L knee fusion.  Pharmacy has been consulted for Vancomycin dosing.  Noted patient was on Vancomycin and Aztreonam PTA for prosthetic joint infection.  Per ID clinic note (09/15/15) plan is to continue IV antibiotics for an additional 2 weeks after knee fusion surgery.   Pt has been receiving Vancomycin 1gm IV q8h via PICC at rehab center with last level noted in EMR of 17.5 (09/15/15).    Plan: Vancomycin 1000 mg IV every 8 hours.  Goal trough 15-20 mcg/mL.  Next dose due at 2200 tonight. Will check trough at steady state on this regimen.    Height: 5\' 7"  (170.2 cm) Weight: 141 lb (64 kg) IBW/kg (Calculated) : 66.1  Temp (24hrs), Avg:97.5 F (36.4 C), Min:97.2 F (36.2 C), Max:97.8 F (36.6 C)  No results for input(s): WBC, CREATININE, LATICACIDVEN, VANCOTROUGH, VANCOPEAK, VANCORANDOM, GENTTROUGH, GENTPEAK, GENTRANDOM, TOBRATROUGH, TOBRAPEAK, TOBRARND, AMIKACINPEAK, AMIKACINTROU, AMIKACIN in the last 168 hours.  Estimated Creatinine Clearance: 88.9 mL/min (by C-G formula based on SCr of 0.8 mg/dL).    Allergies  Allergen Reactions  . No Known Allergies     Antimicrobials this admission: Vanc PTA >> 8/28 inpatient >> (9/11) Ertapenem PTA >> 8/28 inpatient >> (9/11)  Dose adjustments this admission: none  Microbiology results: 07/30/15 L knee drainage >> Enterobacter and Enterococcus  Thank you for allowing pharmacy to be a part of this patient's care.  Toys 'R' UsKimberly Reginald Mangels, Pharm.D., BCPS Clinical Pharmacist Pager [phone] 10/12/2015 8:47 PM

## 2015-10-12 NOTE — Progress Notes (Signed)
Arrived to Short Stay for surgery with a RT SL PICC.   There is a long length of the PICC exposed under the drsg.   The site is exposed, not covered under the drsg.   It should be removed.   [facility] infection.   Staff is getting a stat CXR for placement.   I suggested it be removed due it being exposed and partially pulled out.    Pt stated [facility].   No record of it being in place on previous records.    Awaiting CXR results.   Staff contacted Dr. Eulah PontMurphy.

## 2015-10-12 NOTE — Progress Notes (Signed)
PICC line flushed with ease and blood return present.  PICC line dressing was not occlusive, IV team consulted to assist with dressing.

## 2015-10-12 NOTE — Interval H&P Note (Signed)
History and Physical Interval Note:  10/12/2015 2:11 PM  [name]  has presented today for surgery, with the diagnosis of FAILED LEFT KNEE PROSTHESIS  The various methods of treatment have been discussed with the patient and family. After consideration of risks, benefits and other options for treatment, the patient has consented to  Procedure(s): ARTHRODESIS KNEE (Left) as a surgical intervention .  The patient's history has been reviewed, patient examined, no change in status, stable for surgery.  I have reviewed the patient's chart and labs.  Questions were answered to the patient's satisfaction.     Tiona Ruane D

## 2015-10-12 NOTE — Anesthesia Procedure Notes (Signed)
Procedure Name: Intubation Date/Time: 10/12/2015 3:38 PM Performed by: Gwenyth AllegraADAMI, Phelicia Dantes Pre-anesthesia Checklist: Patient identified, Emergency Drugs available, Suction available, Patient being monitored and Timeout performed Patient Re-evaluated:Patient Re-evaluated prior to inductionPreoxygenation: Pre-oxygenation with 100% oxygen Intubation Type: IV induction Ventilation: Mask ventilation without difficulty Grade View: Grade I Tube type: Oral Tube size: 7.5 mm Number of attempts: 1 Airway Equipment and Method: Stylet Secured at: 21 cm Tube secured with: Tape Dental Injury: Teeth and Oropharynx as per pre-operative assessment

## 2015-10-12 NOTE — Transfer of Care (Signed)
Immediate Anesthesia Transfer of Care Note  Patient: Neomia DearWillie J Joyce  Procedure(s) Performed: Procedure(s): ARTHRODESIS KNEE (Left)  Patient Location: PACU  Anesthesia Type:General  Level of Consciousness: awake, alert  and oriented  Airway & Oxygen Therapy: Patient Spontanous Breathing and Patient connected to nasal cannula oxygen  Post-op Assessment: Report given to RN and Post -op Vital signs reviewed and stable  Post vital signs: Reviewed and stable  Last Vitals:  Vitals:   10/12/15 1346 10/12/15 1828  BP: (!) 155/83   Pulse: (!) 58   Resp: 20   Temp: 36.6 C 36.2 C    Last Pain:  Vitals:   10/12/15 1422  TempSrc:   PainSc: 5       Patients Stated Pain Goal: 2 (10/12/15 1422)  Complications: No apparent anesthesia complications

## 2015-10-12 NOTE — Progress Notes (Signed)
PICC order discontinued at transfer per chart.  Please reorder PICC if you would like patient to have one.  Thank you,  Gasper LloydKerry Prentis Langdon, RN VAST

## 2015-10-12 NOTE — Progress Notes (Signed)
Removed RT SL PICC.   It was out to the 25cm mark.   Total length upon removal was 45cm  Site within normal limits.   Pressure drsg applied.  Pt on the way for knee surgery.   He is in Short Stay when all this occurred.  Anesthesia is starting a PIV for surgery.

## 2015-10-13 DIAGNOSIS — T8454XD Infection and inflammatory reaction due to internal left knee prosthesis, subsequent encounter: Secondary | ICD-10-CM

## 2015-10-13 DIAGNOSIS — M00862 Arthritis due to other bacteria, left knee: Secondary | ICD-10-CM

## 2015-10-13 DIAGNOSIS — Y792 Prosthetic and other implants, materials and accessory orthopedic devices associated with adverse incidents: Secondary | ICD-10-CM

## 2015-10-13 DIAGNOSIS — B9689 Other specified bacterial agents as the cause of diseases classified elsewhere: Secondary | ICD-10-CM

## 2015-10-13 LAB — CBC
HCT: 27.7 % — ABNORMAL LOW (ref 39.0–52.0)
Hemoglobin: 9 g/dL — ABNORMAL LOW (ref 13.0–17.0)
MCH: 27.6 pg (ref 26.0–34.0)
MCHC: 32.5 g/dL (ref 30.0–36.0)
MCV: 85 fL (ref 78.0–100.0)
Platelets: 186 10*3/uL (ref 150–400)
RBC: 3.26 MIL/uL — ABNORMAL LOW (ref 4.22–5.81)
RDW: 16.2 % — ABNORMAL HIGH (ref 11.5–15.5)
WBC: 9.9 10*3/uL (ref 4.0–10.5)

## 2015-10-13 LAB — COMPREHENSIVE METABOLIC PANEL
ALT: 43 U/L (ref 17–63)
AST: 46 U/L — ABNORMAL HIGH (ref 15–41)
Albumin: 3.3 g/dL — ABNORMAL LOW (ref 3.5–5.0)
Alkaline Phosphatase: 107 U/L (ref 38–126)
Anion gap: 8 (ref 5–15)
BUN: 14 mg/dL (ref 6–20)
CO2: 23 mmol/L (ref 22–32)
Calcium: 9.2 mg/dL (ref 8.9–10.3)
Chloride: 102 mmol/L (ref 101–111)
Creatinine, Ser: 0.69 mg/dL (ref 0.61–1.24)
GFR calc Af Amer: >60 mL/min (ref >60)
GFR calc non Af Amer: >60 mL/min (ref >60)
Glucose, Bld: 132 mg/dL — ABNORMAL HIGH (ref 65–99)
Potassium: 5.2 mmol/L — ABNORMAL HIGH (ref 3.5–5.1)
Sodium: 133 mmol/L — ABNORMAL LOW (ref 135–145)
Total Bilirubin: 0.8 mg/dL (ref 0.3–1.2)
Total Protein: 5.7 g/dL — ABNORMAL LOW (ref 6.5–8.1)

## 2015-10-13 MED ORDER — ACETAMINOPHEN 325 MG PO TABS
650.0000 mg | ORAL_TABLET | Freq: Four times a day (QID) | ORAL | Status: DC
  Administered 2015-10-13 – 2015-10-20 (×28): 650 mg via ORAL
  Filled 2015-10-13 (×29): qty 2

## 2015-10-13 MED ORDER — KETOROLAC TROMETHAMINE 15 MG/ML IJ SOLN
7.5000 mg | Freq: Three times a day (TID) | INTRAMUSCULAR | Status: DC
  Administered 2015-10-13 – 2015-10-14 (×4): 7.5 mg via INTRAVENOUS
  Filled 2015-10-13 (×4): qty 1

## 2015-10-13 MED ORDER — OXYCODONE HCL 5 MG PO TABS
5.0000 mg | ORAL_TABLET | ORAL | Status: DC | PRN
  Administered 2015-10-13 – 2015-10-19 (×26): 15 mg via ORAL
  Administered 2015-10-19: 10 mg via ORAL
  Administered 2015-10-20 (×3): 15 mg via ORAL
  Filled 2015-10-13 (×9): qty 3
  Filled 2015-10-13: qty 2
  Filled 2015-10-13 (×13): qty 3
  Filled 2015-10-13: qty 1
  Filled 2015-10-13 (×7): qty 3
  Filled 2015-10-13: qty 2

## 2015-10-13 NOTE — Care Management Note (Signed)
Case Management Note  Patient Details  Name: Neomia DearWillie J Boettcher MRN: [id number] Date of Birth: 12/11/1955  Subjective/Objective:                    Action/Plan: Spoke with patient regarding discharge planning . [facility] and wants to return at discharge. Almira CoasterGina SW aware .  Expected Discharge Date:                  Expected Discharge Plan:  Home w Home Health Services  In-House Referral:  Clinical Social Work  Discharge planning Services  CM Consult  Post Acute Care Choice:    Choice offered to:  Patient  DME Arranged:    DME Agency:     HH Arranged:    HH Agency:     Status of Service:  Completed, signed off  If discussed at [employer] Meetings, dates discussed:    Additional Comments:  Kingsley PlanWile, Terrie Haring Marie, RN 10/13/2015, 10:39 AM

## 2015-10-13 NOTE — Progress Notes (Signed)
OT Cancellation Note  Patient Details Name: [name] MRN: [id number] DOB: 06/19/1955   Cancelled Treatment:    Reason Eval/Treat Not Completed: Other (comment).  Plan is for pt to return to SNF after hospitalization.  Will defer OT to that venue.  Alva Broxson 10/13/2015, 3:01 PM  Marica OtterMaryellen Sargun Rummell, OTR/L [phone] 10/13/2015

## 2015-10-13 NOTE — Clinical Social Work Note (Signed)
Clinical Social Work Assessment  Patient Details  Name: [name] MRN: [id number] Date of Birth: 11/07/1955  Date of referral:  10/13/15               Reason for consult:  Facility Placement                Permission sought to share information with:  Oceanographeracility Contact Representative Permission granted to share information::  Yes, Release of Information Signed  Name::        Agency::   ([facility])  Relationship::     Contact Information:     Housing/Transportation Living arrangements for the past 2 months:  Skilled Nursing Facility Source of Information:  Patient Patient Interpreter Needed:  None Criminal Activity/Legal Involvement Pertinent to Current Situation/Hospitalization:  No - Comment as needed Significant Relationships:  Adult Children Lives with:  Self Do you feel safe going back to the place where you live?  No Need for family participation in patient care:  No (Coment)  Care giving concerns:  No caregivers present at time of discharge.   Social Worker assessment / plan:  CSW discussed dc plans with patient.  Patient was admitted back in [facility].  Patient was again admitted here and [facility] is [facility] this time as well.  [facility] notified.  Patient states his adult children are his main supports, but have full time jobs and cannot offer the support needed at time of discharge.  Employment status:  Disabled (Comment on whether or not currently receiving Disability) Insurance information:  Teacher, English as a foreign languageManaged Medicare (UHC Medicare) PT Recommendations:  Skilled Nursing Facility Information / Referral to community resources:  Skilled Nursing Facility  Patient/Family's Response to care:  Patient is agreeable to SNF- [facility].  Patient/Family's Understanding of and Emotional Response to Diagnosis, Current Treatment, and Prognosis:  Patient offered no emotional response to placement. Patient is  agreeable and realistic regarding continued need for STR at time of discharge.  Patient did exhibit and express gratitude for "good insurance".  Emotional Assessment Appearance:  Appears older than stated age Attitude/Demeanor/Rapport:    Affect (typically observed):  Accepting, Adaptable Orientation:  Oriented to Self, Oriented to Place, Oriented to  Time, Oriented to Situation Alcohol / Substance use:  Not Applicable Psych involvement (Current and /or in the community):  No (Comment)  Discharge Needs  Concerns to be addressed:  No discharge needs identified Readmission within the last 30 days:  No Current discharge risk:  None Barriers to Discharge:  No Barriers Identified   Rondel Batonngle, Sol Odor C, LCSW 10/13/2015, 12:26 PM
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
## 2015-10-13 NOTE — Anesthesia Postprocedure Evaluation (Signed)
Anesthesia Post Note  Patient: Neomia DearWillie J Sozio  Procedure(s) Performed: Procedure(s) (LRB): ARTHRODESIS KNEE (Left)  Patient location during evaluation: PACU Anesthesia Type: General Level of consciousness: awake and alert Pain management: pain level controlled Vital Signs Assessment: post-procedure vital signs reviewed and stable Respiratory status: spontaneous breathing, nonlabored ventilation, respiratory function stable and patient connected to nasal cannula oxygen Cardiovascular status: blood pressure returned to baseline and stable Postop Assessment: no signs of nausea or vomiting Anesthetic complications: no    Last Vitals:  Vitals:   10/13/15 0405 10/13/15 0959  BP: 110/80 111/74  Pulse: 100 (!) 103  Resp: 19 19  Temp: 36.6 C 36.8 C    Last Pain:  Vitals:   10/13/15 0959  TempSrc: Oral  PainSc:                  Abree Romick DAVID

## 2015-10-13 NOTE — Op Note (Signed)
10/12/2015  10:15 AM  PATIENT:  [name] Weidler    PRE-OPERATIVE DIAGNOSIS:  FAILED LEFT KNEE PROSTHESIS  POST-OPERATIVE DIAGNOSIS:  Same  PROCEDURE:  ARTHRODESIS KNEE  SURGEON:  Amariyah Bazar, Jewel BaizeIMOTHY D, MD  ASSISTANT: Gean BirchwoodFrank Rowan MD  ANESTHESIA:   gen  PREOPERATIVE INDICATIONS:  [name] Sandlin is a  60 y.o. male with a diagnosis of FAILED LEFT KNEE PROSTHESIS who failed conservative measures and elected for surgical management.    The risks benefits and alternatives were discussed with the patient preoperatively including but not limited to the risks of infection, bleeding, nerve injury, cardiopulmonary complications, the need for revision surgery, among others, and the patient was willing to proceed.  OPERATIVE IMPLANTS: Biomet fusion system  OPERATIVE FINDINGS: no purulence  BLOOD LOSS: 300  COMPLICATIONS: none  TOURNIQUET TIME: 100min  OPERATIVE PROCEDURE:  Patient was identified in the preoperative holding area and site was marked by me He was transported to the operating theater and placed on the table in supine position taking care to pad all bony prominences. After a preincinduction time out anesthesia was induced. The left lower extremity was prepped and draped in normal sterile fashion and a pre-incision timeout was performed. He received vanc for preoperative antibiotics.   A weighted from the antibiotics run prior to inflating the tourniquet.  I made an anterior incision across his knee there are previous incision and dissected down to the cement spacer elevated soft tissue off of this and was able to remove the cement spacer completely from both the femur and the tibia.  To limit soft tissue tension on his repaired skin I did elect to perform a papillectomy this was removed intact.  I then assembled the trial fusion nail held in place and marked appropriate level of resection at the femur and tibia using a saw I excised the distal femur I was careful to protect the  neurovascular structures.  I then resected proximal tibia to an appropriate distance to allow lace min of the arthrodesis system without tension on his neurovascular structures.  Next I sequentially reamed and selected an appropriate size stem for cementing into the proximal femur I selected a 1 50 x 13 nail. For the tibia was selected a 1 50 x 11.  I inserted the trials and was happy with the placement of everything I then dropped the tourniquet to confirm no large vascular injury. We then reinflated this several minutes later.  I thoroughly irrigated the canal both femur and tibia  I then opened the appropriate implants cement was mixed and I cemented the proximal stem seating it completely followed by the distal stem very happy with the cement mantle and fill of both canals.  I then waited for the cement to harden completely I placed the coupling device I placed it in primarily flexion with some valgus positioning and secured in place I said his foot rotation of batches other side with a slight external rotation to protect his ankle from inversion injuries.  I then secured the implant into place using the tensioners and torque limiter. This was the 7 flexion coupling device.  I then thoroughly irrigated his incision I was able to place a deep closure after placing a drain followed by nylon stitches in the skin there was no tension on the skin closure which shows very happy about.  Sterile dressing was applied he was awoken and taken the PACU in stable condition  POST OPERATIVE PLAN: Aspirin for DVT prophylaxis weightbearing as tolerated mobilize  for DVT prophylaxis as well

## 2015-10-13 NOTE — Progress Notes (Signed)
   Assessment: 1 Day Post-Op  S/P Procedure(s) (LRB): ARTHRODESIS KNEE (Left) by Dr. Jewel Baizeimothy D. Eulah PontMurphy and Dr. Turner Danielsowan on 10/12/15  Principal Problem:   Prosthetic joint infection [facility](HCC) Active Problems:   Primary osteoarthritis of left knee   Chronic obstructive airway disease with asthma (HCC)   Essential hypertension   Wound dehiscence   Rupture of left patellar tendon, open, post-total knee replacement   Patellar tendon rupture   Wound dehiscence, surgical   Tobacco abuse   Surgical wound dehiscence   Gram-negative infection   Knee osteoarthritis  AFVSN.  Difficult pain control overnight.  Plan: Advance diet Up with therapy.  PT / OT / SW evaluation. Continue ABX therapy Follow lower extremity motor function - likely disinhibited dt pain.  Add Toradol, scheduled Tylenol, and increase oxycodone dosage range 5-15 Consult to ID / Pharmacy for ABX dosing.  PICC was removed pre-op - will need replacement prior to discharge if IV ABX recommended. Continue Hemovac Injury:  Weight Bearing: Weight Bearing as Tolerated (WBAT) Left Leg Dressings: Reinforce PRN w/ ABD / ace.    VTE prophylaxis: Aspirin, SCDs, ambulation Dispo: Eventually Skilled Nursing Facility/Rehab  Subjective: Patient reports pain as moderate to severe. Pain not well controlled with regular IV and PO meds.  Tolerating liquids.  Urinating.  No Flatus.  No CP, SOB.  Not yet OOB.  Objective:   VITALS:   Vitals:   10/12/15 2015 10/12/15 2024 10/13/15 0126 10/13/15 0405  BP:  (!) 159/101 130/86 110/80  Pulse:  83 78 100  Resp:  19 18 19   Temp:  97.8 F (36.6 C) 97.6 F (36.4 C) 97.8 F (36.6 C)  TempSrc:  Oral Oral Oral  SpO2: 100% 100% 100% 100%  Weight:      Height:        General: Uncomfortable appearing in NAD.  Upright in bed.  Mild bloody drainage in Hemovac. Resp: No increased WOB. Clear bilaterally Cardio: regular rate and rhythm ABD +BS, soft Neurologically intact MSK  Left foot skin dry  and warm. Very slight movement of toes.   Sensation intact distally Intact pulses distally Incision: dressing C/D/I    Henry BilletHenry Calvin Martensen [name] 10/13/2015, 7:14 AM

## 2015-10-13 NOTE — Evaluation (Signed)
Physical Therapy Evaluation Patient Details Name: [name] MRN: [id number] DOB: 10-03-1955 Today's Date: 10/13/2015   History of Present Illness  60 y.o. male with failed Lt knee prosthesis who underwent Lt knee fusion on 10/12/15. Pt with previous TKA on 07/07/15 with subsequent dehiscence and patella tendon rupture. Pt with following infection with I&D and antibiotic spacer 07/30/15. PMH: hypertension, asthma, TBI, lumbago, COPD, seizures, TKA 06/2015.   Clinical Impression  Pt willing to sit EOB during PT session but refusing any further activity. Pt needing min/mod assistance with bed mobility. Anticipate pt will return to SNF for further rehabilitation following acute stay. PT to continue to follow and progress as tolerated. Encouragement needed throughout session for pt to participate with PT.     Follow Up Recommendations SNF;Supervision for mobility/OOB    Equipment Recommendations  None recommended by PT    Recommendations for Other Services       Precautions / Restrictions Precautions Precautions: Fall Restrictions Weight Bearing Restrictions: Yes LLE Weight Bearing: Weight bearing as tolerated      Mobility  Bed Mobility Overal bed mobility: Needs Assistance Bed Mobility: Supine to Sit     Supine to sit: Mod assist Sit to supine: Min assist   General bed mobility comments: Mod assist with LLE and and trunk to come to sitting EOB. Min assist with LLE with sit to supine.   Transfers                 General transfer comment: pt refusing to attempt transfers.   Ambulation/Gait                Stairs            Wheelchair Mobility    Modified Rankin (Stroke Patients Only)       Balance Overall balance assessment: Needs assistance Sitting-balance support: No upper extremity supported Sitting balance-Leahy Scale: Fair                                       Pertinent Vitals/Pain Pain Assessment: 0-10 Pain Score: 8   Pain Location: Lt leg Pain Descriptors / Indicators:  (hurts) Pain Intervention(s): Monitored during session    Home Living Family/patient expects to be discharged to:: Skilled nursing facility                 Additional Comments: Reports living at SNF PTA.     Prior Function Level of Independence: Needs assistance   Gait / Transfers Assistance Needed: reports using w/c for mobility, short distance ambulation with rw.            Hand Dominance        Extremity/Trunk Assessment   Upper Extremity Assessment: Generalized weakness           Lower Extremity Assessment: Generalized weakness   LLE Deficits / Details: assist needed to lift and move LLE.      Communication   Communication: No difficulties  Cognition Arousal/Alertness: Awake/alert Behavior During Therapy: Agitated;WFL for tasks assessed/performed (initially agitated, WFL by end of session. ) Overall Cognitive Status: Within Functional Limits for tasks assessed                      General Comments General comments (skin integrity, edema, etc.): Pt willing to sit EOB but refusing any other activity.     Exercises  Assessment/Plan    PT Assessment Patient needs continued PT services  PT Diagnosis Difficulty walking;Generalized weakness   PT Problem List Decreased strength;Decreased balance;Decreased activity tolerance;Decreased mobility  PT Treatment Interventions DME instruction;Gait training;Functional mobility training;Therapeutic activities;Therapeutic exercise;Patient/family education   PT Goals (Current goals can be found in the Care Plan section) Acute Rehab PT Goals Patient Stated Goal: go back to where he was staying PTA. PT Goal Formulation: With patient Time For Goal Achievement: 10/27/15 Potential to Achieve Goals: Good    Frequency Min 2X/week   Barriers to discharge        Co-evaluation               End of Session   Activity Tolerance: Patient  tolerated treatment well Patient left: in bed;with call bell/phone within reach;with SCD's reapplied Nurse Communication: Mobility status;Weight bearing status         Time: [id number] PT Time Calculation (min) (ACUTE ONLY): 14 min   Charges:   PT Evaluation $PT Eval Moderate Complexity: 1 Procedure     PT G Codes:        Christiane HaBenjamin J. Rosene Pilling, PT, CSCS Pager [phone] Office [phone]  10/13/2015, 1:09 PM

## 2015-10-13 NOTE — NC FL2 (Signed)
[place] MEDICAID FL2 LEVEL OF CARE SCREENING TOOL     IDENTIFICATION  Patient Name: [name] Birthdate: 04/24/1955 Sex: male Admission Date (Current Location): 10/12/2015  [facility] [place]   Facility and Address:  [facility], [address], [place], [place] [address]      Provider Number: [id number]  Attending Physician Name and Address:  Sheral Apleyimothy D Murphy, MD  Relative Name and Phone Number:       Current Level of Care: Hospital Recommended Level of Care: Skilled Nursing Facility Prior Approval Number:    Date Approved/Denied:   PASRR Number: [id number]  Discharge Plan: SNF    Current Diagnoses: Patient Active Problem List   Diagnosis Date Noted  . Knee osteoarthritis 10/12/2015  . Gram-negative infection   . Surgical wound dehiscence 07/30/2015  . Wound dehiscence, surgical 07/29/2015  . Tobacco abuse 07/29/2015  . 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 Wound dehiscence 07/19/2015  . Rupture of left patellar tendon, open, post-total knee replacement 07/19/2015  . Patellar tendon rupture 07/19/2015  . Primary osteoarthritis of left knee 06/18/2015  . Chronic obstructive airway disease with asthma (HCC) 06/18/2015  . Essential hypertension 06/18/2015  . Special screening for malignant neoplasms, colon 12/11/2012    Orientation RESPIRATION BLADDER Height & Weight     Self, Time, Situation, Place  Normal Continent Weight: 141 lb (64 kg) Height:  5\' 7"  (170.2 cm)  BEHAVIORAL SYMPTOMS/MOOD NEUROLOGICAL BOWEL NUTRITION STATUS      Continent Diet  AMBULATORY STATUS COMMUNICATION OF NEEDS Skin   Extensive Assist Verbally Surgical wounds                       Personal Care Assistance Level of Assistance  Bathing, Dressing Bathing Assistance: Limited assistance   Dressing Assistance: Limited assistance     Functional Limitations Info             SPECIAL CARE FACTORS FREQUENCY  PT (By  licensed PT), OT (By licensed OT)     PT Frequency: daily OT Frequency: daily            Contractures Contractures Info: Not present    Additional Factors Info  Allergies, Code Status Code Status Info: FULL Allergies Info: NKA           Current Medications (10/13/2015):  This is the current hospital active medication list Current Facility-Administered Medications  Medication Dose Route Frequency Provider Last Rate Last Dose  . acetaminophen (TYLENOL) tablet 650 mg  650 mg Oral Q6H PRN Sheral Apleyimothy D Murphy, MD   650 mg at 10/12/15 2248   Or  . acetaminophen (TYLENOL) suppository 650 mg  650 mg Rectal Q6H PRN Sheral Apleyimothy D Murphy, MD      . acetaminophen (TYLENOL) tablet 650 mg  650 mg Oral Q6H Sheral Apleyimothy D Murphy, MD   650 mg at 10/13/15 0841  . albuterol (PROVENTIL) (2.5 MG/3ML) 0.083% nebulizer solution 3 mL  3 mL Inhalation Q6H PRN Sheral Apleyimothy D Murphy, MD      . aspirin tablet 325 mg  325 mg Oral Daily Sheral Apleyimothy D Murphy, MD   325 mg at 10/13/15 1131  . docusate sodium (COLACE) capsule 100 mg  100 mg Oral BID Sheral Apleyimothy D Murphy, MD   100 mg at 10/13/15 1131  . ertapenem (INVANZ) 1 g in sodium chloride 0.9 % 50 mL IVPB  1 g Intravenous Q24H Sheral Apleyimothy D Murphy, MD   1 g at 10/13/15  0535  . ketorolac (TORADOL) 15 MG/ML injection 7.5 mg  7.5 mg Intravenous Q8H Sheral Apley, MD   7.5 mg at 10/13/15 0842  . lisinopril (PRINIVIL,ZESTRIL) tablet 20 mg  20 mg Oral BH-q7a Sheral Apley, MD   20 mg at 10/13/15 [phone]  . methocarbamol (ROBAXIN) tablet 500 mg  500 mg Oral Q6H PRN Sheral Apley, MD   500 mg at 10/13/15 0419   Or  . methocarbamol (ROBAXIN) 500 mg in dextrose 5 % 50 mL IVPB  500 mg Intravenous Q6H PRN Sheral Apley, MD      . metoCLOPramide (REGLAN) tablet 5-10 mg  5-10 mg Oral Q8H PRN Sheral Apley, MD       Or  . metoCLOPramide (REGLAN) injection 5-10 mg  5-10 mg Intravenous Q8H PRN Sheral Apley, MD      . morphine 2 MG/ML injection 2 mg  2 mg Intravenous Q2H PRN Sheral Apley, MD   2 mg at 10/13/15 1132  . ondansetron (ZOFRAN) tablet 4 mg  4 mg Oral Q6H PRN Sheral Apley, MD       Or  . ondansetron [facility]) injection 4 mg  4 mg Intravenous Q6H PRN Sheral Apley, MD      . oxyCODONE (Oxy IR/ROXICODONE) immediate release tablet 5-15 mg  5-15 mg Oral Q3H PRN Sheral Apley, MD   15 mg at 10/13/15 0842  . vancomycin (VANCOCIN) IVPB 1000 mg/200 mL premix  1,000 mg Intravenous Q8H Kimberly B Hammons, RPH   1,000 mg at 10/13/15 0535     Discharge Medications: Please see discharge summary for a list of discharge medications.  Relevant Imaging Results:  Relevant Lab Results:   Additional Information SSN: [id number]  Rondel Baton, LCSW

## 2015-10-14 LAB — COMPREHENSIVE METABOLIC PANEL
ALT: 18 U/L (ref 17–63)
AST: 20 U/L (ref 15–41)
Albumin: 2.9 g/dL — ABNORMAL LOW (ref 3.5–5.0)
Alkaline Phosphatase: 83 U/L (ref 38–126)
Anion gap: 9 (ref 5–15)
BUN: 21 mg/dL — ABNORMAL HIGH (ref 6–20)
CO2: 21 mmol/L — ABNORMAL LOW (ref 22–32)
Calcium: 8.8 mg/dL — ABNORMAL LOW (ref 8.9–10.3)
Chloride: 102 mmol/L (ref 101–111)
Creatinine, Ser: 1.59 mg/dL — ABNORMAL HIGH (ref 0.61–1.24)
GFR calc Af Amer: 53 mL/min — ABNORMAL LOW (ref >60)
GFR calc non Af Amer: 46 mL/min — ABNORMAL LOW (ref >60)
Glucose, Bld: 109 mg/dL — ABNORMAL HIGH (ref 65–99)
Potassium: 4.6 mmol/L (ref 3.5–5.1)
Sodium: 132 mmol/L — ABNORMAL LOW (ref 135–145)
Total Bilirubin: 0.6 mg/dL (ref 0.3–1.2)
Total Protein: 5.2 g/dL — ABNORMAL LOW (ref 6.5–8.1)

## 2015-10-14 LAB — CBC
HCT: 22.5 % — ABNORMAL LOW (ref 39.0–52.0)
HCT: 27 % — ABNORMAL LOW (ref 39.0–52.0)
Hemoglobin: 7.1 g/dL — ABNORMAL LOW (ref 13.0–17.0)
Hemoglobin: 8.8 g/dL — ABNORMAL LOW (ref 13.0–17.0)
MCH: 27.2 pg (ref 26.0–34.0)
MCH: 28 pg (ref 26.0–34.0)
MCHC: 31.6 g/dL (ref 30.0–36.0)
MCHC: 32.6 g/dL (ref 30.0–36.0)
MCV: 86.2 fL (ref 78.0–100.0)
MCV: 86 fL (ref 78.0–100.0)
Platelets: 174 10*3/uL (ref 150–400)
Platelets: 132 10*3/uL — ABNORMAL LOW (ref 150–400)
RBC: 2.61 MIL/uL — ABNORMAL LOW (ref 4.22–5.81)
RBC: 3.14 MIL/uL — ABNORMAL LOW (ref 4.22–5.81)
RDW: 16.6 % — ABNORMAL HIGH (ref 11.5–15.5)
RDW: 15.9 % — ABNORMAL HIGH (ref 11.5–15.5)
WBC: 9.5 10*3/uL (ref 4.0–10.5)
WBC: 9.2 10*3/uL (ref 4.0–10.5)

## 2015-10-14 LAB — PREPARE RBC (CROSSMATCH)

## 2015-10-14 LAB — VANCOMYCIN, TROUGH: Vancomycin Tr: 36 ug/mL (ref 15–20)

## 2015-10-14 MED ORDER — ALBUTEROL SULFATE (2.5 MG/3ML) 0.083% IN NEBU
3.0000 mL | INHALATION_SOLUTION | Freq: Three times a day (TID) | RESPIRATORY_TRACT | Status: DC
  Administered 2015-10-14 – 2015-10-17 (×10): 3 mL via RESPIRATORY_TRACT
  Filled 2015-10-14 (×10): qty 3

## 2015-10-14 MED ORDER — ALBUTEROL SULFATE (2.5 MG/3ML) 0.083% IN NEBU
2.5000 mg | INHALATION_SOLUTION | RESPIRATORY_TRACT | Status: DC | PRN
  Administered 2015-10-15: 2.5 mg via RESPIRATORY_TRACT
  Filled 2015-10-14: qty 3

## 2015-10-14 MED ORDER — SODIUM CHLORIDE 0.9 % IV SOLN: 1.0000 g | INTRAVENOUS | 0 refills | Status: DC

## 2015-10-14 MED ORDER — VANCOMYCIN HCL IN NACL 1-0.9 GM/200ML-% IV SOLN: 1000.0000 mg | Freq: Three times a day (TID) | INTRAVENOUS | 0 refills | Status: DC

## 2015-10-14 MED ORDER — SODIUM CHLORIDE 0.9 % IV SOLN
Freq: Once | INTRAVENOUS | Status: AC
  Administered 2015-10-14: 16:00:00 via INTRAVENOUS

## 2015-10-14 MED ORDER — ALBUTEROL SULFATE (2.5 MG/3ML) 0.083% IN NEBU
3.0000 mL | INHALATION_SOLUTION | Freq: Four times a day (QID) | RESPIRATORY_TRACT | Status: DC
  Administered 2015-10-14: 3 mL via RESPIRATORY_TRACT
  Filled 2015-10-14: qty 3

## 2015-10-14 MED ORDER — VANCOMYCIN HCL 10 G IV SOLR: 1250.0000 mg | Freq: Two times a day (BID) | INTRAVENOUS | Status: DC

## 2015-10-14 NOTE — Progress Notes (Signed)
Pharmacy Antibiotic Note  [name] is a 60 y.o. male admitted on 10/12/2015 with L TKA infection s/p surgical explant and OR today for L knee fusion.  Pharmacy has been consulted for Vancomycin dosing.  Noted patient was on Vancomycin and Aztreonam PTA for prosthetic joint infection.  Per ID clinic note (09/15/15) plan is to continue IV antibiotics for an additional 2 weeks after knee fusion surgery.   Pt has been receiving Vancomycin 1gm IV q8h via PICC at rehab center with last level noted in EMR of 17.5 (09/15/15).    Vancomycin level came back at 36 tonight, however previous dose was given late and this is ~ 5 hour level. Pt still likely needs a longer interval. Will hold dose for tonight and check a random level in the morning to assess rescheduling doses.  Plan: Hold Vancomycin tonight.  Goal trough 15-20 mcg/mL.   Will check another random level in the morning. Monitor clinical progress, c/s, renal function, abx plan/LOT VT@SS  as indicated   Height: 5\' 7"  (170.2 cm) Weight: 141 lb (64 kg) IBW/kg (Calculated) : 66.1  Temp (24hrs), Avg:98 F (36.7 C), Min:97.1 F (36.2 C), Max:98.9 F (37.2 C)   Recent Labs Lab 10/13/15 0717 10/13/15 0930 10/14/15 0507 10/14/15 1638 10/14/15 2057  WBC  --  9.9 9.5 9.2  --   CREATININE 0.69  --  1.59*  --   --   VANCOTROUGH  --   --   --   --  36*    Estimated Creatinine Clearance: 44.7 mL/min (by C-G formula based on SCr of 1.59 mg/dL).    Allergies  Allergen Reactions  . No Known Allergies     Antimicrobials this admission: Vanc PTA >> 8/28 inpatient >> (9/11) Ertapenem PTA >> 8/28 inpatient >> (9/11)  Dose adjustments this admission: Holding dose currently  Microbiology results: 07/30/15 L knee drainage >> Enterobacter and Enterococcus   Thank you for allowing us to participate in this patients care. Signe Coltonya C Yussuf Sawyers, PharmD Pager: [phone] 10/14/2015 10:08 PM
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
## 2015-10-14 NOTE — Progress Notes (Signed)
   Assessment: 2 Days Post-Op  S/P Procedure(s) (LRB): ARTHRODESIS KNEE (Left) by Dr. Jewel Baizeimothy D. Eulah PontMurphy and Dr. Turner Danielsowan on 10/12/15  Principal Problem:   Prosthetic joint infection [facility](HCC) Active Problems:   Primary osteoarthritis of left knee   Chronic obstructive airway disease with asthma (HCC)   Essential hypertension   Wound dehiscence   Rupture of left patellar tendon, open, post-total knee replacement   Patellar tendon rupture   Wound dehiscence, surgical   Tobacco abuse   Surgical wound dehiscence   Gram-negative infection   Knee osteoarthritis Acute Blood loss anemia - likely with dilutional component.   H/H down to 7.1/22.5 from 9.0/27.7 -    AFVSN.  Pain better controlled.  Up to edge of bed w/ therapy.    Cr elevated today.  Possibly pre renal dt low po intake / blood loss.  Will transfuse and follow PO intake.  D/C Toradol.   Plan: Plan to transfuse 2 units PRBCs today.   Nebulizer treatments q 6hrs while awake.  Please Provide Incentive spirometer. Up with therapy.  Continue ABX therapy per ID Follow lower extremity motor function - possibly disinhibited dt pain.  ID / Pharmacy for ABX dosing. Replace PICC Continue Hemovac  Weight Bearing: Weight Bearing as Tolerated (WBAT) Left Leg Dressings: Reinforce PRN w/ ABD / ace.    VTE prophylaxis: Aspirin, SCDs, ambulation Dispo: Eventually Skilled Nursing Facility/Rehab  Subjective: Patient reports pain as moderate.  Pain controlled with IV and PO meds.  Tolerating diet.  Urinating.  No CP, SOB.  Not yet OOB.  Objective:   VITALS:   Vitals:   10/13/15 0959 10/13/15 1419 10/13/15 1819 10/14/15 0416  BP: 111/74 114/72 119/69 119/64  Pulse: (!) 103 (!) 116 (!) 108 100  Resp: 19 19 19 19   Temp: 98.2 F (36.8 C) 98.1 F (36.7 C) 98.1 F (36.7 C) 98.6 F (37 C)  TempSrc: Oral Oral Oral Oral  SpO2: 99% 100% 98% 98%  Weight:      Height:        Lab Results  Component Value Date   WBC 9.5 10/14/2015   HGB  7.1 (L) 10/14/2015   HCT 22.5 (L) 10/14/2015   MCV 86.2 10/14/2015   PLT 174 10/14/2015   BMET    Component Value Date/Time   NA 132 (L) 10/14/2015 0507   K 4.6 10/14/2015 0507   CL 102 10/14/2015 0507   CO2 21 (L) 10/14/2015 0507   GLUCOSE 109 (H) 10/14/2015 0507   BUN 21 (H) 10/14/2015 0507   BUN 10 09/01/2015   CREATININE 1.59 (H) 10/14/2015 0507   CREATININE 0.67 (L) 09/15/2015 1454   CALCIUM 8.8 (L) 10/14/2015 0507   GFRNONAA 46 (L) 10/14/2015 0507   GFRAA 53 (L) 10/14/2015 0507    General: Upright in bed NAD.  Bloody drainage in Hemovac. Resp: No increased WOB.  Few coarse sounds cleared w/ cough  / faint wheeze.  Cardio: regular rate and rhythm ABD +BS, soft Neurologically intact MSK  Left foot skin dry and warm. Very slight movement of toes.   Sensation intact distally Intact pulses distally Incision: dressing C/D/I    [name] 10/14/2015, 7:32 AM

## 2015-10-15 LAB — COMPREHENSIVE METABOLIC PANEL
ALT: 19 U/L (ref 17–63)
AST: 15 U/L (ref 15–41)
Albumin: 2.9 g/dL — ABNORMAL LOW (ref 3.5–5.0)
Alkaline Phosphatase: 83 U/L (ref 38–126)
Anion gap: 8 (ref 5–15)
BUN: 20 mg/dL (ref 6–20)
CO2: 24 mmol/L (ref 22–32)
Calcium: 9 mg/dL (ref 8.9–10.3)
Chloride: 101 mmol/L (ref 101–111)
Creatinine, Ser: 1.2 mg/dL (ref 0.61–1.24)
GFR calc Af Amer: >60 mL/min (ref >60)
GFR calc non Af Amer: >60 mL/min (ref >60)
Glucose, Bld: 115 mg/dL — ABNORMAL HIGH (ref 65–99)
Potassium: 4.5 mmol/L (ref 3.5–5.1)
Sodium: 133 mmol/L — ABNORMAL LOW (ref 135–145)
Total Bilirubin: 0.6 mg/dL (ref 0.3–1.2)
Total Protein: 5.5 g/dL — ABNORMAL LOW (ref 6.5–8.1)

## 2015-10-15 LAB — CBC
HCT: 28.9 % — ABNORMAL LOW (ref 39.0–52.0)
Hemoglobin: 8.9 g/dL — ABNORMAL LOW (ref 13.0–17.0)
MCH: 27.1 pg (ref 26.0–34.0)
MCHC: 30.8 g/dL (ref 30.0–36.0)
MCV: 87.8 fL (ref 78.0–100.0)
Platelets: 145 10*3/uL — ABNORMAL LOW (ref 150–400)
RBC: 3.29 MIL/uL — ABNORMAL LOW (ref 4.22–5.81)
RDW: 16.1 % — ABNORMAL HIGH (ref 11.5–15.5)
WBC: 8.6 10*3/uL (ref 4.0–10.5)

## 2015-10-15 LAB — TYPE AND SCREEN
ABO/RH(D): AB POS
Antibody Screen: NEGATIVE
Unit division: 0
Unit division: 0

## 2015-10-15 LAB — VANCOMYCIN, TROUGH: Vancomycin Tr: 25 ug/mL (ref 15–20)

## 2015-10-15 MED ORDER — ENSURE ENLIVE PO LIQD
237.0000 mL | Freq: Two times a day (BID) | ORAL | Status: DC
  Administered 2015-10-15 – 2015-10-20 (×5): 237 mL via ORAL

## 2015-10-15 MED ORDER — SODIUM CHLORIDE 0.9% FLUSH
10.0000 mL | INTRAVENOUS | Status: DC | PRN
  Administered 2015-10-19 – 2015-10-20 (×2): 10 mL
  Filled 2015-10-15 (×2): qty 40

## 2015-10-15 MED ORDER — VANCOMYCIN HCL IN DEXTROSE 750-5 MG/150ML-% IV SOLN
750.0000 mg | Freq: Two times a day (BID) | INTRAVENOUS | Status: DC
  Administered 2015-10-15 – 2015-10-16 (×4): 750 mg via INTRAVENOUS
  Filled 2015-10-15 (×5): qty 150
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
## 2015-10-15 NOTE — Progress Notes (Signed)
Peripherally Inserted Central Catheter/Midline Placement  The IV Nurse has discussed with the patient and/or persons authorized to consent for the patient, the purpose of this procedure and the potential benefits and risks involved with this procedure.  The benefits include less needle sticks, lab draws from the catheter and patient may be discharged home with the catheter.  Risks include, but not limited to, infection, bleeding, blood clot (thrombus formation), and puncture of an artery; nerve damage and irregular heat beat.  Alternatives to this procedure were also discussed.  PICC/Midline Placement Documentation        Maximino GreenlandLumban, Ashely Goosby Albarece 10/15/2015, 11:19 AM

## 2015-10-15 NOTE — Progress Notes (Signed)
Pharmacy Antibiotic Note  [name] is a 60 y.o. male admitted on 10/12/2015 with left TKA infection s/p surgical explant and OR for left knee fusion.  Pharmacy has been consulted for vancomycin dosing.  Noted patient was on vancomycin and zztreonam PTA for prosthetic joint infection.  Per ID clinic note (09/15/15) plan is to continue IV antibiotics for an additional 2 weeks after knee fusion surgery.  Patient's AKI is improving and his vancomycin random level decrease to 25 mcg/mL.  Calculated Ke = 0.04558 and t1/2 = 15 hrs.   Plan: - Restart vanc 750mg  IV Q12H for goal trough 15-20 mcg/mL - Continue Invanz 1gm IV Q24H per MD - Monitor renal fxn, clinical progress, vanc trough at Css of new regimen   Height: 5\' 7"  (170.2 cm) Weight: 141 lb (64 kg) IBW/kg (Calculated) : 66.1  Temp (24hrs), Avg:98.1 F (36.7 C), Min:97.6 F (36.4 C), Max:98.9 F (37.2 C)   Recent Labs Lab 10/13/15 0717 10/13/15 0930 10/14/15 0507 10/14/15 1638 10/14/15 2057 10/15/15 0447  WBC  --  9.9 9.5 9.2  --  8.6  CREATININE 0.69  --  1.59*  --   --  1.20  VANCOTROUGH  --   --   --   --  36* 25*    Estimated Creatinine Clearance: 59.3 mL/min (by C-G formula based on SCr of 1.2 mg/dL).    Allergies  Allergen Reactions  . No Known Allergies     Antimicrobials this admission: Vanc PTA >> 8/28 inpatient >> (9/11) Invanz PTA >> 8/28 inpatient >> (9/11)  Dose adjustments this admission: 8/30 VT (2100, drawn 5 hrs post dose) = 36 mcg/mL on 1gm q8 (SCr 1.59) 8/31 VR (0500) = 25 mcg/mL  Microbiology results: 07/30/15 L knee drainage >> Enterobacter and Enterococcus   [name] D. [name], PharmD, BCPS Pager:  [phone] 10/15/2015, 12:35 PM

## 2015-10-15 NOTE — Care Management Important Message (Signed)
Important Message  Patient Details  Name: [name] MRN: [id number] Date of Birth: 09/04/1955   Medicare Important Message Given:  Yes    Searcy Miyoshi 10/15/2015, 11:00 AM

## 2015-10-16 LAB — CBC
HCT: 25.1 % — ABNORMAL LOW (ref 39.0–52.0)
Hemoglobin: 8 g/dL — ABNORMAL LOW (ref 13.0–17.0)
MCH: 28.1 pg (ref 26.0–34.0)
MCHC: 31.9 g/dL (ref 30.0–36.0)
MCV: 88.1 fL (ref 78.0–100.0)
Platelets: 153 10*3/uL (ref 150–400)
RBC: 2.85 MIL/uL — ABNORMAL LOW (ref 4.22–5.81)
RDW: 16.3 % — ABNORMAL HIGH (ref 11.5–15.5)
WBC: 6.5 10*3/uL (ref 4.0–10.5)

## 2015-10-16 MED ORDER — BISACODYL 10 MG RE SUPP: 10.0000 mg | Freq: Every day | RECTAL | Status: DC | PRN

## 2015-10-16 MED ORDER — POLYETHYLENE GLYCOL 3350 17 G PO PACK
17.0000 g | PACK | Freq: Every day | ORAL | Status: DC
  Administered 2015-10-16 – 2015-10-20 (×5): 17 g via ORAL
  Filled 2015-10-16 (×5): qty 1

## 2015-10-16 MED ORDER — MAGNESIUM HYDROXIDE 400 MG/5ML PO SUSP: 30.0000 mL | Freq: Every day | ORAL | Status: DC | PRN

## 2015-10-16 NOTE — Progress Notes (Signed)
PT Cancellation Note  Patient Details Name: Neomia DearWillie J Fromer MRN: [id number] DOB: 02/27/1955   Cancelled Treatment:    Reason Eval/Treat Not Completed: Pain limiting ability to participate (Pt reports he needs more pain medicine to participate in PT treatment.  PTA called nurse and she reports she will bring IV pain medicine.  Will allow IV meds to take action then return to perform tx.  )   Orest Dygert Artis DelayJ Kamauri Denardo 10/16/2015, 3:02 PM Joycelyn RuaAimee Yared Susan, PTA pager [phone]

## 2015-10-16 NOTE — Clinical Social Work Note (Addendum)
[facility] CSW informed that they do not currently have a bed available (private room) and will not be able to accept patient at discharge. Request made for weekend CSW to talk with patient and provide any bed offers from facility search.  Genelle BalVanessa Cyril Woodmansee, MSW, LCSW Licensed Clinical Social Worker Clinical Social Work Department [facility] [phone]
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
## 2015-10-16 NOTE — Progress Notes (Signed)
Physical Therapy Treatment Patient Details Name: [name] MRN: [id number] DOB: 1955-08-05 Today's Date: 10/16/2015    History of Present Illness 60 y.o. male with failed Lt knee prosthesis who underwent Lt knee fusion on 10/12/15. Pt with previous TKA on 07/07/15 with subsequent dehiscence and patella tendon rupture. Pt with following infection with I&D and antibiotic spacer 07/30/15. PMH: hypertension, asthma, TBI, lumbago, COPD, seizures, TKA 06/2015.     PT Comments    Pt agreeable to treatment after allowing IV meds time to kick in.  Pt performed x2 standing trials and attempted to initiate weight bearing.  Pt educated on benefits of mobility.  Pt agreeable.  Pt required increased time to establish patient rapport.     Follow Up Recommendations  SNF;Supervision for mobility/OOB     Equipment Recommendations  None recommended by PT    Recommendations for Other Services       Precautions / Restrictions Precautions Precautions: Fall Restrictions Weight Bearing Restrictions: Yes LLE Weight Bearing: Weight bearing as tolerated    Mobility  Bed Mobility Overal bed mobility: Needs Assistance Bed Mobility: Supine to Sit     Supine to sit: Mod assist     General bed mobility comments: Mod assist with LLE and and trunk to come to sitting EOB.   Transfers Overall transfer level: Needs assistance Equipment used: Rolling walker (2 wheeled) Transfers: Sit to/from Stand Sit to Stand: Min assist;Mod assist (assist level varries.  )         General transfer comment: LLE scissors toward midline when intially standing, required cues to increase BOS and encourage weight bearing.  Pt presents with posterior lean and narrow BOS with LLE crossing midline on several occasions.    Ambulation/Gait Ambulation/Gait assistance:  (refused. )               Stairs            Wheelchair Mobility    Modified Rankin (Stroke Patients Only)       Balance Overall  balance assessment: Needs assistance   Sitting balance-Leahy Scale: Good       Standing balance-Leahy Scale: Poor Standing balance comment: Focus on static stance to ecourage weight bearing and pre gait weight shifting activities.  Pt required min-mod assist to maintain balance.                      Cognition Arousal/Alertness: Awake/alert Behavior During Therapy: Agitated;WFL for tasks assessed/performed (remains to be intially agitated but by end of session patient pleasant and thankful for tx performed.  ) Overall Cognitive Status: Within Functional Limits for tasks assessed                      Exercises Total Joint Exercises Ankle Circles/Pumps: Left;PROM;20 reps (reports tightness in L ankle performed dorsiflexion with slight over pressure.  )    General Comments        Pertinent Vitals/Pain Pain Assessment: Faces Faces Pain Scale: Hurts a little bit Pain Location: L leg Pain Descriptors / Indicators: Sore Pain Intervention(s): Premedicated before session;Monitored during session;Repositioned    Home Living                      Prior Function            PT Goals (current goals can now be found in the care plan section) Acute Rehab PT Goals Patient Stated Goal: To get out of the hospital so he  can get some "loving" and feel better.   Potential to Achieve Goals: Good Progress towards PT goals: Progressing toward goals    Frequency  Min 2X/week    PT Plan Current plan remains appropriate    Co-evaluation             End of Session Equipment Utilized During Treatment: Gait belt Activity Tolerance: Patient tolerated treatment well Patient left: in bed;with call bell/phone within reach;with SCD's reapplied     Time: [id number] PT Time Calculation (min) (ACUTE ONLY): 33 min  Charges:  $Therapeutic Activity: 23-37 mins                    G Codes:      Florestine Aversimee J Willine Schwalbe 10/16/2015, 4:02 PM  Joycelyn RuaAimee Eadie Repetto, PTA pager  [phone]

## 2015-10-17 LAB — VANCOMYCIN, TROUGH: Vancomycin Tr: 9 ug/mL — ABNORMAL LOW (ref 15–20)

## 2015-10-17 LAB — BASIC METABOLIC PANEL
Anion gap: 7 (ref 5–15)
BUN: 10 mg/dL (ref 6–20)
CO2: 31 mmol/L (ref 22–32)
Calcium: 9.5 mg/dL (ref 8.9–10.3)
Chloride: 101 mmol/L (ref 101–111)
Creatinine, Ser: 0.71 mg/dL (ref 0.61–1.24)
GFR calc Af Amer: >60 mL/min (ref >60)
GFR calc non Af Amer: >60 mL/min (ref >60)
Glucose, Bld: 100 mg/dL — ABNORMAL HIGH (ref 65–99)
Potassium: 4.2 mmol/L (ref 3.5–5.1)
Sodium: 139 mmol/L (ref 135–145)

## 2015-10-17 LAB — CBC
HCT: 24.4 % — ABNORMAL LOW (ref 39.0–52.0)
Hemoglobin: 7.8 g/dL — ABNORMAL LOW (ref 13.0–17.0)
MCH: 28 pg (ref 26.0–34.0)
MCHC: 32 g/dL (ref 30.0–36.0)
MCV: 87.5 fL (ref 78.0–100.0)
Platelets: 175 10*3/uL (ref 150–400)
RBC: 2.79 MIL/uL — ABNORMAL LOW (ref 4.22–5.81)
RDW: 16.4 % — ABNORMAL HIGH (ref 11.5–15.5)
WBC: 5.8 10*3/uL (ref 4.0–10.5)

## 2015-10-17 LAB — PREPARE RBC (CROSSMATCH)

## 2015-10-17 MED ORDER — ACETAMINOPHEN 325 MG PO TABS
650.0000 mg | ORAL_TABLET | Freq: Once | ORAL | Status: AC
  Administered 2015-10-17: 650 mg via ORAL
  Filled 2015-10-17: qty 2

## 2015-10-17 MED ORDER — VANCOMYCIN HCL IN DEXTROSE 750-5 MG/150ML-% IV SOLN
750.0000 mg | Freq: Three times a day (TID) | INTRAVENOUS | Status: DC
  Administered 2015-10-17 – 2015-10-20 (×9): 750 mg via INTRAVENOUS
  Filled 2015-10-17 (×11): qty 150

## 2015-10-17 MED ORDER — VANCOMYCIN HCL IN DEXTROSE 750-5 MG/150ML-% IV SOLN
750.0000 mg | Freq: Two times a day (BID) | INTRAVENOUS | Status: DC
  Administered 2015-10-17: 750 mg via INTRAVENOUS
  Filled 2015-10-17 (×2): qty 150

## 2015-10-17 MED ORDER — SODIUM CHLORIDE 0.9 % IV SOLN
Freq: Once | INTRAVENOUS | Status: AC
  Administered 2015-10-17: 14:00:00 via INTRAVENOUS

## 2015-10-17 MED ORDER — DIPHENHYDRAMINE HCL 50 MG/ML IJ SOLN
12.5000 mg | Freq: Once | INTRAMUSCULAR | Status: AC
  Administered 2015-10-17: 12.5 mg via INTRAVENOUS
  Filled 2015-10-17: qty 1

## 2015-10-17 NOTE — Progress Notes (Signed)
Pharmacy Antibiotic Note  [name] is a 60 y.o. male admitted on 10/12/2015 with left TKA infection s/p surgical explant and OR for left knee fusion.  Pharmacy has been consulted for vancomycin dosing.  Continues on vancomycin and Invanz for prosthetic joint infection. S/p left TKA, s/p arthrodesis knee. ID recommending therapy through 9/25. Afebrile, WBC wnl. VT today is now low as patient looks to have recovered from AKI. SCr down to wnl, CrCl ~4380ml/min. UOP up from yesterday. Already received next 750mg  dose.  Plan: Increase vancomycin to 750mg  IV Q8 (goal trough 15-2320mcg/mL) Continue ertapenem 1g IV Q24 per MD Monitor clinical picture, renal function, new VT at Css   Height: 5\' 7"  (170.2 cm) Weight: 141 lb (64 kg) IBW/kg (Calculated) : 66.1  Temp (24hrs), Avg:98.3 F (36.8 C), Min:98.2 F (36.8 C), Max:98.6 F (37 C)   Recent Labs Lab 10/13/15 0717  10/14/15 0507 10/14/15 1638  10/15/15 0447 10/16/15 0500 10/17/15 0500 10/17/15 1130  WBC  --   < > 9.5 9.2  --  8.6 6.5 5.8  --   CREATININE 0.69  --  1.59*  --   --  1.20  --  0.71  --   VANCOTROUGH  --   --   --   --   < > 25*  --   --  9*  < > = values in this interval not displayed.  Estimated Creatinine Clearance: 88.9 mL/min (by C-G formula based on SCr of 0.8 mg/dL).    Allergies  Allergen Reactions  . No Known Allergies     Antimicrobials this admission: Vanc PTA >> 8/28 inpatient >> (9/25) Invanz PTA >> 8/28 inpatient >> (9/25)  Dose adjustments this admission: 8/30 VT (2100, drawn 5 hrs post dose) = 36 mcg/mL on 1gm q8 (SCr 1.59) 8/31 VR (0500) = 25 mcg/mL 9/2 VT = 9 (Drawn appropriately; increase dose to 750mg  IV Q8)  Microbiology results: 07/30/15 L knee drainage >> Enterobacter and Enterococcus  Enzo BiNathan Nataliyah [name], PharmD, BCPS Clinical Pharmacist Pager [phone] 10/17/2015 2:17 PM

## 2015-10-17 NOTE — Progress Notes (Signed)
[facility] Progress Note  Subjective  C/o pain in L leg but appears very comfortable, watching TV  ROS As above  Objective   BP (!) 154/80 (BP Location: Left Arm)   Pulse 88   Temp 98.2 F (36.8 C) (Oral)   Resp 18   Ht '5\' 7"'  (1.702 m)   Wt 64 kg (141 lb)   SpO2 94%   BMI 22.08 kg/m   Intake/Output      09/01 0701 - 09/02 0700 09/02 0701 - 09/03 0700   P.O. 2480    IV Piggyback     Total Intake(mL/kg) 2480 (38.8)    Urine (mL/kg/hr) 3875 (2.5)    Drains     Total Output 3875     Net -1395            Labs Results for [name], [name] (MRN [id number]) as of 10/17/2015 11:37  Ref. Range 10/17/2015 05:00  Sodium Latest Ref Range: 135 - 145 mmol/L 139  Potassium Latest Ref Range: 3.5 - 5.1 mmol/L 4.2  Chloride Latest Ref Range: 101 - 111 mmol/L 101  CO2 Latest Ref Range: 22 - 32 mmol/L 31  BUN Latest Ref Range: 6 - 20 mg/dL 10  Creatinine Latest Ref Range: 0.61 - 1.24 mg/dL 0.71  Calcium Latest Ref Range: 8.9 - 10.3 mg/dL 9.5  EGFR (Non-African Amer.) Latest Ref Range: >60 mL/min >60  EGFR (African American) Latest Ref Range: >60 mL/min >60  Glucose Latest Ref Range: 65 - 99 mg/dL 100 (H)  Anion gap Latest Ref Range: 5 - 15  7  WBC Latest Ref Range: 4.0 - 10.5 K/uL 5.8  RBC Latest Ref Range: 4.22 - 5.81 MIL/uL 2.79 (L)  Hemoglobin Latest Ref Range: 13.0 - 17.0 g/dL 7.8 (L)  HCT Latest Ref Range: 39.0 - 52.0 % 24.4 (L)  MCV Latest Ref Range: 78.0 - 100.0 fL 87.5  MCH Latest Ref Range: 26.0 - 34.0 pg 28.0  MCHC Latest Ref Range: 30.0 - 36.0 g/dL 32.0  RDW Latest Ref Range: 11.5 - 15.5 % 16.4 (H)  Platelets Latest Ref Range: 150 - 400 K/uL 175    Exam  Gen: resting comfortably in bed, NAD Lungs: clear anterior fields Cardiac: RRR, s1 and s2 Abd: + BS, NTND Ext:       Left Lower Extremity   Dressing c/d/i  Ext warm  + DP pulse  Pt not really willing to move toes  DPN, SPN, TN sensation grossly intact    Assessment and Plan   POD/HD#:  54  60 y/o male s/p L knee arthrodesis  - L knee arthrodesis for infected TKA and patellar tendon rupture   WBAT  PT/OT  Dressing changes as needed  Will change tomorrow    - Pain management:  Continue with current regimen  - ABL anemia/Hemodynamics  Slight dip in H/H  Will transfuse 1 more unit PRBCs  Cbc in am  - DVT/PE prophylaxis:  ASA per primary team  - ID:   Vancomycin and ertapenem per ID thru 11/09/2015   - FEN/GI prophylaxis/Foley/Lines:  Diet as tolerated  - Dispo:  SNF likely Monday      Jari Pigg, PA-C [facility] [phone] (P) [phone] (O) 10/17/2015 11:37 AM

## 2015-10-18 LAB — BASIC METABOLIC PANEL
Anion gap: 5 (ref 5–15)
BUN: 16 mg/dL (ref 6–20)
CO2: 30 mmol/L (ref 22–32)
Calcium: 9.3 mg/dL (ref 8.9–10.3)
Chloride: 100 mmol/L — ABNORMAL LOW (ref 101–111)
Creatinine, Ser: 0.78 mg/dL (ref 0.61–1.24)
GFR calc Af Amer: >60 mL/min (ref >60)
GFR calc non Af Amer: >60 mL/min (ref >60)
Glucose, Bld: 129 mg/dL — ABNORMAL HIGH (ref 65–99)
Potassium: 4.3 mmol/L (ref 3.5–5.1)
Sodium: 135 mmol/L (ref 135–145)

## 2015-10-18 LAB — CBC
HCT: 30.5 % — ABNORMAL LOW (ref 39.0–52.0)
Hemoglobin: 9.8 g/dL — ABNORMAL LOW (ref 13.0–17.0)
MCH: 28.1 pg (ref 26.0–34.0)
MCHC: 32.1 g/dL (ref 30.0–36.0)
MCV: 87.4 fL (ref 78.0–100.0)
Platelets: 188 10*3/uL (ref 150–400)
RBC: 3.49 MIL/uL — ABNORMAL LOW (ref 4.22–5.81)
RDW: 15.6 % — ABNORMAL HIGH (ref 11.5–15.5)
WBC: 7.9 10*3/uL (ref 4.0–10.5)

## 2015-10-18 LAB — TYPE AND SCREEN
ABO/RH(D): AB POS
Antibody Screen: NEGATIVE
Unit division: 0

## 2015-10-18 NOTE — Progress Notes (Signed)
SPORTS MEDICINE AND JOINT REPLACEMENT  Henry SpurlingStephen Lucey, MD    Henry Nancyolby Robbins, PA-C [address], [place], [place]  [id number]                             [phone]   PROGRESS NOTE  Subjective:  negative for Chest Pain  negative for Shortness of Breath  negative for Nausea/Vomiting   negative for Calf Pain  positive for Bowel Movement   Tolerating Diet: yes         Patient reports pain as 5 on 0-10 scale.    Objective: Vital signs in last 24 hours:   Patient Vitals for the past 24 hrs:  BP Temp Temp src Pulse Resp SpO2  10/18/15 0421 (!) 150/80 98.4 F (36.9 C) Oral 79 19 100 %  10/17/15 1951 122/76 98.3 F (36.8 C) Oral 76 19 100 %  10/17/15 1707 126/75 98.8 F (37.1 C) Oral 79 18 100 %  10/17/15 1436 129/74 98.5 F (36.9 C) Oral 80 - 98 %  10/17/15 1354 140/89 98.6 F (37 C) Oral 88 - 96 %    @flow {1959:LAST@   Intake/Output from previous day:   09/02 0701 - 09/03 0700 In: 1166 [P.O.:480] Out: 250 [Urine:250]   Intake/Output this shift:   No intake/output data recorded.   Intake/Output      09/02 0701 - 09/03 0700 09/03 0701 - 09/04 0700   P.O. 480    I.V. (mL/kg) 0 (0)    Blood 336    IV Piggyback 350    Total Intake(mL/kg) 1166 (18.2)    Urine (mL/kg/hr) 250 (0.2)    Total Output 250     Net +916             LABORATORY DATA:  Recent Labs  10/13/15 0930 10/14/15 0507 10/14/15 1638 10/15/15 0447 10/16/15 0500 10/17/15 0500 10/18/15 0500  WBC 9.9 9.5 9.2 8.6 6.5 5.8 7.9  HGB 9.0* 7.1* 8.8* 8.9* 8.0* 7.8* 9.8*  HCT 27.7* 22.5* 27.0* 28.9* 25.1* 24.4* 30.5*  PLT 186 174 132* 145* 153 175 188    Recent Labs  10/13/15 0717 10/14/15 0507 10/15/15 0447 10/17/15 0500 10/18/15 0500  NA 133* 132* 133* 139 135  K 5.2* 4.6 4.5 4.2 4.3  CL 102 102 101 101 100*  CO2 23 21* 24 31 30   BUN 14 21* 20 10 16   CREATININE 0.69 1.59* 1.20 0.71 0.78  GLUCOSE 132* 109* 115* 100* 129*  CALCIUM 9.2 8.8* 9.0 9.5 9.3   Lab Results  Component Value  Date   INR 1.12 06/26/2015   INR 1.03 01/20/2014   INR 0.98 11/16/2013    Examination:  General appearance: alert, cooperative and no distress Extremities: extremities normal, atraumatic, no cyanosis or edema  Wound Exam: clean, dry, intact   Drainage:  None: wound tissue dry  Motor Exam: Quadriceps and Hamstrings Intact  Sensory Exam: Superficial Peroneal, Deep Peroneal and Tibial normal   Assessment:    6 Days Post-Op  Procedure(s) (LRB): ARTHRODESIS KNEE (Left)  ADDITIONAL DIAGNOSIS:  Principal Problem:   Prosthetic joint infection (HCC) Active Problems:   Primary osteoarthritis of left knee   Chronic obstructive airway disease with asthma (HCC)   Essential hypertension   Wound dehiscence   Rupture of left patellar tendon, open, post-total knee replacement   Patellar tendon rupture   Wound dehiscence, surgical   Tobacco abuse   Surgical wound dehiscence   Gram-negative  infection   Knee osteoarthritis  Acute Blood Loss Anemia   Plan: Physical Therapy as ordered Weight Bearing as Tolerated (WBAT)  DVT Prophylaxis:  Aspirin  DISCHARGE PLAN: Skilled Nursing Facility/Rehab  DISCHARGE NEEDS: HHPT   Patient doing well and was alert and eating on exam. Dressing is still intact with little to no drainage. I will change the dressing prior to D/C to SNF which will likely be tomorrow or Tuesday. Continue current regimen of pain management and IV antibiotics. HGB up to 9.8 today. Will continue to follow.          Guy Sandifer 10/18/2015, 8:22 AM

## 2015-10-19 LAB — CBC
HCT: 32.9 % — ABNORMAL LOW (ref 39.0–52.0)
Hemoglobin: 10.6 g/dL — ABNORMAL LOW (ref 13.0–17.0)
MCH: 28 pg (ref 26.0–34.0)
MCHC: 32.2 g/dL (ref 30.0–36.0)
MCV: 87 fL (ref 78.0–100.0)
Platelets: 260 10*3/uL (ref 150–400)
RBC: 3.78 MIL/uL — ABNORMAL LOW (ref 4.22–5.81)
RDW: 15.2 % (ref 11.5–15.5)
WBC: 8.7 10*3/uL (ref 4.0–10.5)

## 2015-10-19 LAB — BASIC METABOLIC PANEL
Anion gap: 6 (ref 5–15)
BUN: 12 mg/dL (ref 6–20)
CO2: 28 mmol/L (ref 22–32)
Calcium: 9.5 mg/dL (ref 8.9–10.3)
Chloride: 98 mmol/L — ABNORMAL LOW (ref 101–111)
Creatinine, Ser: 0.71 mg/dL (ref 0.61–1.24)
GFR calc Af Amer: >60 mL/min (ref >60)
GFR calc non Af Amer: >60 mL/min (ref >60)
Glucose, Bld: 108 mg/dL — ABNORMAL HIGH (ref 65–99)
Potassium: 4.4 mmol/L (ref 3.5–5.1)
Sodium: 132 mmol/L — ABNORMAL LOW (ref 135–145)

## 2015-10-19 LAB — VANCOMYCIN, TROUGH: Vancomycin Tr: 15 ug/mL (ref 15–20)

## 2015-10-19 MED ORDER — OXYCODONE HCL 5 MG PO TABS: 5.0000 mg | ORAL_TABLET | ORAL | 0 refills | Status: DC | PRN

## 2015-10-19 MED ORDER — ASPIRIN 325 MG PO TABS: 325.0000 mg | ORAL_TABLET | Freq: Every day | ORAL | 0 refills | Status: DC

## 2015-10-19 NOTE — Care Management Important Message (Signed)
Important Message  Patient Details  Name: [name] MRN: [id number] Date of Birth: 06/08/1955   Medicare Important Message Given:  Yes    Vidur Knust Stefan ChurchBratton 10/19/2015, 9:39 AM

## 2015-10-19 NOTE — Progress Notes (Signed)
Physical Therapy Treatment Patient Details Name: [name] MRN: [id number] DOB: [birth year] Today's Date: 10/19/2015    History of Present Illness 60 y.o. male with failed Lt knee prosthesis who underwent Lt knee fusion on 10/12/15. Pt with previous TKA on 07/07/15 with subsequent dehiscence and patella tendon rupture. Pt with following infection with I&D and antibiotic spacer 07/30/15. PMH: hypertension, asthma, TBI, lumbago, COPD, seizures, TKA 06/2015.     PT Comments    Pt more receptive to rehab during session.  In standing patient grimacing and moaning in pain.  Pt educated on the benefits of mobility and the need for continued weight bearing to improve healing.  Pt would also benefit from a L PRAFO boot for use in bed when he is not bearing weight to maintain L dorsiflexion.  Pt currently presents in L plantarflexion.     Follow Up Recommendations  SNF;Supervision for mobility/OOB     Equipment Recommendations  None recommended by PT    Recommendations for Other Services       Precautions / Restrictions Precautions Precautions: Fall Restrictions Weight Bearing Restrictions: Yes LLE Weight Bearing: Weight bearing as tolerated    Mobility  Bed Mobility Overal bed mobility: Needs Assistance Bed Mobility: Supine to Sit     Supine to sit: Mod assist (Pt able to perform supine to sit without assist and then required assist to scoot to edge of bed.  )     General bed mobility comments: Pt did not require assist until sitting edge of bed to complete scooting to edge of bed.    Transfers Overall transfer level: Needs assistance Equipment used: Rolling walker (2 wheeled) Transfers: Sit to/from Stand Sit to Stand: Mod assist         General transfer comment: LLE scissors toward midline when intially standing, required cues to increase BOS and encourage weight bearing.  Pt presents with posterior lean and narrow BOS with LLE crossing midline on several occasions.  Pt  required blocking on L LE to avoid adduction.  Pt presents in L plantar flexion and requires weight bearing to stretch heel cord.    Ambulation/Gait           Gait velocity interpretation: <1.8 ft/sec, indicative of risk for recurrent falls General Gait Details: Pt performed 1 step forward and 1 step backwards with mod assist +1 with RW.  pt refused further gait training secondary to pain.     Stairs            Wheelchair Mobility    Modified Rankin (Stroke Patients Only)       Balance     Sitting balance-Leahy Scale: Good       Standing balance-Leahy Scale: Poor                      Cognition Arousal/Alertness: Awake/alert Behavior During Therapy: WFL for tasks assessed/performed;Agitated (Pt intermittently agitated, but with education pt calms down. ) Overall Cognitive Status: Within Functional Limits for tasks assessed                      Exercises Other Exercises Other Exercises: stretching to L heel cord to improve dorsiflexion.      General Comments        Pertinent Vitals/Pain Pain Assessment: Faces Faces Pain Scale: Hurts a little bit Pain Location: L leg Pain Descriptors / Indicators: Sore Pain Intervention(s): Monitored during session;Repositioned    Home Living  Prior Function            PT Goals (current goals can now be found in the care plan section) Acute Rehab PT Goals Patient Stated Goal: "I just want my sister to bring me some hotdogs today." Potential to Achieve Goals: Good Progress towards PT goals: Progressing toward goals    Frequency  Min 2X/week    PT Plan Current plan remains appropriate    Co-evaluation             End of Session Equipment Utilized During Treatment: Gait belt Activity Tolerance: Patient tolerated treatment well Patient left: in bed;with call bell/phone within reach;with SCD's reapplied (Pt position with pillows against plantar surface of foot to  rest in dorsiflexion.  Pt unable to achieve full dorsiflexion.  )     Time: [id number] PT Time Calculation (min) (ACUTE ONLY): 18 min  Charges:  $Therapeutic Activity: 8-22 mins                    G Codes:      Florestine Aversimee J Alexis Mizuno 10/19/2015, 1:18 PM  Joycelyn RuaAimee Latiya Navia, PTA pager [phone]

## 2015-10-19 NOTE — Progress Notes (Signed)
SPORTS MEDICINE AND JOINT REPLACEMENT  Henry SpurlingStephen Lucey, MD    Henry Nancyolby Valari Taylor, PA-C [address], [place], [place]  [id number]                             [phone]   PROGRESS NOTE  Subjective:  negative for Chest Pain  negative for Shortness of Breath  negative for Nausea/Vomiting   negative for Calf Pain  positive for Bowel Movement   Tolerating Diet: yes         Patient reports pain as 4 on 0-10 scale.    Objective: Vital signs in last 24 hours:   Patient Vitals for the past 24 hrs:  BP Temp Temp src Pulse Resp SpO2  10/19/15 0550 (!) 151/93 98.6 F (37 C) Oral 86 18 96 %  10/18/15 2133 140/83 98.4 F (36.9 C) Oral 79 18 95 %  10/18/15 1344 (!) 148/82 98.2 F (36.8 C) Oral 74 - 96 %    @flow {1959:LAST@   Intake/Output from previous day:   09/03 0701 - 09/04 0700 In: 1402 [P.O.:702] Out: 3425 [Urine:3425]   Intake/Output this shift:   No intake/output data recorded.   Intake/Output      09/03 0701 - 09/04 0700 09/04 0701 - 09/05 0700   P.O. 702    I.V. (mL/kg)     Blood     IV Piggyback 700    Total Intake(mL/kg) 1402 (21.9)    Urine (mL/kg/hr) 3425 (2.2)    Stool 0 (0)    Total Output 3425     Net -2023          Urine Occurrence 2 x 1 x   Stool Occurrence 1 x       LABORATORY DATA:  Recent Labs  10/14/15 0507 10/14/15 1638 10/15/15 0447 10/16/15 0500 10/17/15 0500 10/18/15 0500 10/19/15 0455  WBC 9.5 9.2 8.6 6.5 5.8 7.9 8.7  HGB 7.1* 8.8* 8.9* 8.0* 7.8* 9.8* 10.6*  HCT 22.5* 27.0* 28.9* 25.1* 24.4* 30.5* 32.9*  PLT 174 132* 145* 153 175 188 260    Recent Labs  10/13/15 0717 10/14/15 0507 10/15/15 0447 10/17/15 0500 10/18/15 0500 10/19/15 0455  NA 133* 132* 133* 139 135 132*  K 5.2* 4.6 4.5 4.2 4.3 4.4  CL 102 102 101 101 100* 98*  CO2 23 21* 24 31 30 28   BUN 14 21* 20 10 16 12   CREATININE 0.69 1.59* 1.20 0.71 0.78 0.71  GLUCOSE 132* 109* 115* 100* 129* 108*  CALCIUM 9.2 8.8* 9.0 9.5 9.3 9.5   Lab Results  Component  Value Date   INR 1.12 06/26/2015   INR 1.03 01/20/2014   INR 0.98 11/16/2013    Examination:  General appearance: alert, cooperative and no distress Extremities: extremities normal, atraumatic, no cyanosis or edema  Wound Exam: clean, dry, intact   Drainage:  None: wound tissue dry  Motor Exam: Quadriceps and Hamstrings Intact  Sensory Exam: Superficial Peroneal, Deep Peroneal and Tibial normal   Assessment:    7 Days Post-Op  Procedure(s) (LRB): ARTHRODESIS KNEE (Left)  ADDITIONAL DIAGNOSIS:  Principal Problem:   Prosthetic joint infection (HCC) Active Problems:   Primary osteoarthritis of left knee   Chronic obstructive airway disease with asthma (HCC)   Essential hypertension   Wound dehiscence   Rupture of left patellar tendon, open, post-total knee replacement   Patellar tendon rupture   Wound dehiscence, surgical   Tobacco abuse   Surgical  wound dehiscence   Gram-negative infection   Knee osteoarthritis  Acute Blood Loss Anemia   Plan: Physical Therapy as ordered Weight Bearing as Tolerated (WBAT)  DVT Prophylaxis:  Aspirin  DISCHARGE PLAN: Skilled Nursing Facility/Rehab  DISCHARGE NEEDS: HHPT   Patient is doing great and ready for D/C to SNF. CSW note reviewed says that SNF will not have bed ready until after holiday weekend (tomorrow). I have signed RX in chart in case bed becomes available today for D/C.          Guy Sandifer 10/19/2015, 8:01 AM

## 2015-10-19 NOTE — Progress Notes (Signed)
Pharmacy Antibiotic Note  [name] is a 60 y.o. male admitted on 10/12/2015 with left TKA infection s/p surgical explant and OR for left knee fusion.  Pharmacy has been consulted for vancomycin dosing.  Repeat vancomycin trough is therapeutic for goal at 15 on 750mg  IV every 8 hours. Patient remains afebrile and wbc is wnl.   Plan: Continue vancomycin to 750mg  IV Q8 (goal trough 15-8920mcg/mL) Continue ertapenem 1g IV Q24 per MD Monitor clinical picture, renal function, prn VT   Height: 5\' 7"  (170.2 cm) Weight: 141 lb (64 kg) IBW/kg (Calculated) : 66.1  Temp (24hrs), Avg:98.3 F (36.8 C), Min:98 F (36.7 C), Max:98.6 F (37 C)   Recent Labs Lab 10/14/15 0507  10/15/15 0447 10/16/15 0500 10/17/15 0500 10/17/15 1130 10/18/15 0500 10/19/15 0455 10/19/15 1330  WBC 9.5  < > 8.6 6.5 5.8  --  7.9 8.7  --   CREATININE 1.59*  --  1.20  --  0.71  --  0.78 0.71  --   VANCOTROUGH  --   < > 25*  --   --  9*  --   --  15  < > = values in this interval not displayed.  Estimated Creatinine Clearance: 88.9 mL/min (by C-G formula based on SCr of 0.8 mg/dL).    Allergies  Allergen Reactions  . No Known Allergies     Antimicrobials this admission: Vanc PTA >> 8/28 inpatient >> (9/25) Invanz PTA >> 8/28 inpatient >> (9/25)  Dose adjustments this admission: 8/30 VT (2100, drawn 5 hrs post dose) = 36 mcg/mL on 1gm q8 (SCr 1.59) 8/31 VR (0500) = 25 mcg/mL 9/2 VT = 9 (Drawn appropriately; increase dose to 750mg  IV Q8) 9/4 VT = 15 on 750mg  IV Q8H  Microbiology results: 07/30/15 L knee drainage >> Enterobacter and Enterococcus  [name], PharmD, BCPS Clinical Pharmacist [phone] 10/19/2015 2:51 PM

## 2015-10-19 NOTE — Progress Notes (Signed)
[facility] due to patient requiring a private room (due to infection). Patient states he needs to speak with his doctor regarding other SNF options. Patient became upset when told [facility] and wanted to know where his things had been placed (CSW alerted [facility]). Patient states he does not know the other SNF options given to him and must speak with his doctor before choosing one. CSW alerted RN.  Osborne Cascoadia Brithany Whitworth LCSWA [phone]

## 2015-10-19 NOTE — Progress Notes (Signed)
Orthopedic Tech Progress Note Patient Details:  [name] 12/28/1955 [id number]  Ortho Devices Ortho Device/Splint Location: PRAFO boot Ortho Device/Splint Interventions: Application   Saul FordyceJennifer C Nikhil Osei 10/19/2015, 2:39 PM

## 2015-10-20 ENCOUNTER — Encounter (HOSPITAL_COMMUNITY): Payer: Self-pay | Admitting: Orthopedic Surgery

## 2015-10-20 LAB — BASIC METABOLIC PANEL
Anion gap: 7 (ref 5–15)
BUN: 12 mg/dL (ref 6–20)
CO2: 28 mmol/L (ref 22–32)
Calcium: 9.8 mg/dL (ref 8.9–10.3)
Chloride: 100 mmol/L — ABNORMAL LOW (ref 101–111)
Creatinine, Ser: 0.69 mg/dL (ref 0.61–1.24)
GFR calc Af Amer: >60 mL/min (ref >60)
GFR calc non Af Amer: >60 mL/min (ref >60)
Glucose, Bld: 116 mg/dL — ABNORMAL HIGH (ref 65–99)
Potassium: 4.5 mmol/L (ref 3.5–5.1)
Sodium: 135 mmol/L (ref 135–145)

## 2015-10-20 LAB — CBC
HCT: 34.5 % — ABNORMAL LOW (ref 39.0–52.0)
Hemoglobin: 11 g/dL — ABNORMAL LOW (ref 13.0–17.0)
MCH: 27.9 pg (ref 26.0–34.0)
MCHC: 31.9 g/dL (ref 30.0–36.0)
MCV: 87.6 fL (ref 78.0–100.0)
Platelets: 308 10*3/uL (ref 150–400)
RBC: 3.94 MIL/uL — ABNORMAL LOW (ref 4.22–5.81)
RDW: 15 % (ref 11.5–15.5)
WBC: 9.7 10*3/uL (ref 4.0–10.5)

## 2015-10-20 MED ORDER — VANCOMYCIN HCL IN NACL 1-0.9 GM/200ML-% IV SOLN: 750.0000 mg | Freq: Three times a day (TID) | INTRAVENOUS | 0 refills | Status: AC

## 2015-10-20 MED ORDER — HEPARIN SOD (PORK) LOCK FLUSH 100 UNIT/ML IV SOLN
250.0000 [IU] | INTRAVENOUS | Status: AC | PRN
  Administered 2015-10-20: 250 [IU]

## 2015-10-20 MED ORDER — ASPIRIN EC 325 MG PO TBEC: 325.0000 mg | DELAYED_RELEASE_TABLET | Freq: Every day | ORAL | 0 refills | Status: DC

## 2015-10-20 MED ORDER — OMEPRAZOLE 20 MG PO CPDR: 20.0000 mg | DELAYED_RELEASE_CAPSULE | Freq: Every day | ORAL | 0 refills | Status: DC

## 2015-10-20 MED ORDER — METHOCARBAMOL 500 MG PO TABS: 500.0000 mg | ORAL_TABLET | Freq: Four times a day (QID) | ORAL | 0 refills | Status: DC | PRN

## 2015-10-20 MED ORDER — SODIUM CHLORIDE 0.9 % IV SOLN: 1.0000 g | INTRAVENOUS | 0 refills | Status: AC
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
## 2015-10-20 NOTE — Progress Notes (Signed)
[facility].  Pt discharged with SL PICC to [facility].  Report called and given to RN.  Pt has wheelchair that daughter is to pick up on 10/21/15.

## 2015-10-20 NOTE — Clinical Social Work Note (Addendum)
Patient will discharge today per MD order. Patient will discharge to: [facility] RN to call report prior to transportation to: [phone] Transportation: [facility]- to be scheduled at [facility] sent discharge summary to SNF for review.    11:41am- CSW met with patient who states his daughter will be handling the SNF decision.  Arsenio Loader can be reached at [phone].  Arsenio Loader has chosen [facility] for placement and states that she will be able to complete admission's paperwork between 1-2pm today.  [facility] updated.  DC summary sent.  Nonnie Done, LCSW [phone]  Licensed Clinical Social Worker

## 2015-10-20 NOTE — Discharge Instructions (Signed)
INSTRUCTIONS AFTER JOINT REPLACEMENT   o Remove items at home which could result in a fall. This includes throw rugs or furniture in walking pathways o ICE to the affected joint every three hours while awake for 30 minutes at a time, for at least the first 3-5 days, and then as needed for pain and swelling.  Continue to use ice for pain and swelling. You may notice swelling that will progress down to the foot and ankle.  This is normal after surgery.  Elevate your leg when you are not up walking on it.   o Continue to use the breathing machine you got in the hospital (incentive spirometer) which will help keep your temperature down.  It is common for your temperature to cycle up and down following surgery, especially at night when you are not up moving around and exerting yourself.  The breathing machine keeps your lungs expanded and your temperature down.   DIET:  As you were doing prior to hospitalization, we recommend a well-balanced diet.  DRESSING / WOUND CARE / SHOWERING  Keep the surgical dressing until follow up.  IF THE DRESSING FALLS OFF or the wound gets wet inside, change the dressing with sterile gauze.  Please use good hand washing techniques before changing the dressing.  Do not use any lotions or creams on the incision until instructed by your surgeon.    ACTIVITY  o Increase activity slowly as tolerated, but follow the weight bearing instructions below.   o No driving for 6 weeks or until further direction given by your physician.  You cannot drive while taking narcotics.  o No lifting or carrying greater than 10 lbs. until further directed by your surgeon. o Avoid periods of inactivity such as sitting longer than an hour when not asleep. This helps prevent blood clots.  o You may return to work once you are authorized by your doctor.    WEIGHT BEARING   Weight bearing as tolerated with assist device (walker, cane, etc) as directed, use it as long as suggested by your  surgeon or therapist, typically at least 4-6 weeks.   A rehabilitation program following joint replacement surgery can speed recovery and prevent re-injury in the future due to weakened muscles. Contact your doctor or a physical therapist for more information on knee rehabilitation.    CONSTIPATION  Constipation is defined medically as fewer than three stools per week and severe constipation as less than one stool per week.  Even if you have a regular bowel pattern at home, your normal regimen is likely to be disrupted due to multiple reasons following surgery.  Combination of anesthesia, postoperative narcotics, change in appetite and fluid intake all can affect your bowels.   YOU MUST use at least one of the following options; they are listed in order of increasing strength to get the job done.  They are all available over the counter, and you may need to use some, POSSIBLY even all of these options:    Drink plenty of fluids (prune juice may be helpful) and high fiber foods Colace 100 mg by mouth twice a day  Senokot for constipation as directed and as needed Dulcolax (bisacodyl), take with full glass of water  Miralax (polyethylene glycol) once or twice a day as needed.  If you have tried all these things and are unable to have a bowel movement in the first 3-4 days after surgery call either your surgeon or your primary doctor.    If  you experience loose stools or diarrhea, hold the medications until you stool forms back up.  If your symptoms do not get better within 1 week or if they get worse, check with your doctor.  If you experience "the worst abdominal pain ever" or develop nausea or vomiting, please contact the office immediately for further recommendations for treatment.   ITCHING:  If you experience itching with your medications, try taking only a single pain pill, or even half a pain pill at a time.  You can also use Benadryl over the counter for itching or also to help with  sleep.   TED HOSE STOCKINGS:  Use stockings on both legs until for at least 2 weeks or as directed by physician office. They may be removed at night for sleeping.  MEDICATIONS:  See your medication summary on the After Visit Summary that nursing will review with you.  You may have some home medications which will be placed on hold until you complete the course of blood thinner medication.  It is important for you to complete the blood thinner medication as prescribed.  PRECAUTIONS:  If you experience chest pain or shortness of breath - call 911 immediately for transfer to the hospital emergency department.   If you develop a fever greater that 101 F, purulent drainage from wound, increased redness or drainage from wound, foul odor from the wound/dressing, or calf pain - CONTACT YOUR SURGEON.                                                   FOLLOW-UP APPOINTMENTS:  If you do not already have a post-op appointment, please call the office for an appointment to be seen by your surgeon.  Guidelines for how soon to be seen are listed in your After Visit Summary, but are typically between 1-4 weeks after surgery.   MAKE SURE YOU:   Understand these instructions.   Get help right away if you are not doing well or get worse.    Thank you for letting us be a part of your medical care team.  It is a privilege we respect greatly.  We hope these instructions will help you stay on track for a fast and full recovery!

## 2015-10-20 NOTE — Progress Notes (Signed)
Pt demanded to get the [facility] boot off his left foot. He said that its making his leg hurt more. Pt educated on importance of keeping boot on. Pt stated his understanding but still wants to boot off. PRN pain meds given. Will try to put boot on again later.

## 2015-10-27 ENCOUNTER — Ambulatory Visit (INDEPENDENT_AMBULATORY_CARE_PROVIDER_SITE_OTHER): Payer: Medicare Other | Admitting: Internal Medicine

## 2015-10-27 ENCOUNTER — Encounter: Payer: Self-pay | Admitting: Internal Medicine

## 2015-10-27 VITALS — BP 124/79 | HR 79 | Temp 97.7°F

## 2015-10-27 DIAGNOSIS — T8450XD Infection and inflammatory reaction due to unspecified internal joint prosthesis, subsequent encounter: Secondary | ICD-10-CM | POA: Diagnosis not present

## 2015-10-27 NOTE — Progress Notes (Signed)
Patient ID: TOMA ARTS, male   DOB: Oct 04, 1955, 60 y.o.   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 He underwent resection of old prosthesis and fusion placement on 8/28. He was discharged on 4 wk of vancomycin and ertapenem to end on 9/25. He has been at nursing home receiving iv therapy. He reports that he is doing well. He had cast removed yesterday [facility]. Incision is healing still has significatn swelling to his left knee  Outpatient Encounter Prescriptions as of 10/27/2015  Medication Sig  . acetaminophen (TYLENOL) 325 MG tablet Take 325 mg by mouth every 6 (six) hours as needed for moderate pain.  Marland Kitchen albuterol (PROVENTIL HFA;VENTOLIN HFA) 108 (90 BASE) MCG/ACT inhaler Inhale 1-2 puffs into the lungs every 6 (six) hours as needed for wheezing.  Marland Kitchen aspirin 325 MG tablet Take 1 tablet (325 mg total) by mouth daily.  Marland Kitchen aspirin EC 325 MG tablet Take 1 tablet (325 mg total) by mouth daily.  . ertapenem 1 g in sodium chloride 0.9 % 50 mL Inject 1 g into the vein daily.  . Heparin Lock Flush (HEPARIN, PORCINE, LOCK FLUSH IV) Inject 5 mLs into the vein. Flush with 5 mL after ABT use  . lisinopril (PRINIVIL,ZESTRIL) 20 MG tablet Take 20 mg by mouth every morning.   . methocarbamol (ROBAXIN) 500 MG tablet Take 1 tablet (500 mg total) by mouth every 6 (six) hours as needed for muscle spasms.  Marland Kitchen omeprazole (PRILOSEC) 20 MG capsule Take 1 capsule (20 mg total) by mouth daily.  . ondansetron (ZOFRAN) 4 MG tablet Take 1 tablet (4 mg total) by mouth every 8 (eight) hours as needed for nausea or vomiting.  Marland Kitchen oxyCODONE (OXY IR/ROXICODONE) 5 MG immediate release tablet Take 1-2 tablets (5-10 mg total) by mouth every 3 (three) hours as needed for breakthrough pain.  . Vancomycin HCl in NaCl 1-0.9 GM/200ML-% SOLN Inject 750 mg into the vein every 8 (eight) hours.   No  facility-administered encounter medications on file as of 10/27/2015.      Patient Active Problem List   Diagnosis Date Noted  . Knee osteoarthritis 10/12/2015  . Gram-negative infection   . Surgical wound dehiscence 07/30/2015  . Wound dehiscence, surgical 07/29/2015  . Tobacco abuse 07/29/2015  . 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 Wound dehiscence 07/19/2015  . Rupture of left patellar tendon, open, post-total knee replacement 07/19/2015  . Patellar tendon rupture 07/19/2015  . Primary osteoarthritis of left knee 06/18/2015  . Chronic obstructive airway disease with asthma (HCC) 06/18/2015  . Essential hypertension 06/18/2015  . Special screening for malignant neoplasms, colon 12/11/2012     Health Maintenance Due  Topic Date Due  . INFLUENZA VACCINE  09/15/2015     Review of Systems 10 point ros is negative Physical Exam   BP 124/79   Pulse 79   Temp 97.7 F (36.5 C) (Oral)   Physical Exam  Constitutional: He is oriented to person, place, and time. He appears well-developed and well-nourished. No distress.  HENT:  Mouth/Throat: Oropharynx is clear and moist. No oropharyngeal exudate.  Cardiovascular: Normal rate, regular rhythm and normal heart sounds. Exam reveals no gallop and no friction rub.  No murmur heard.  Pulmonary/Chest: Effort normal and breath sounds normal. No respiratory distress. He has no wheezes.  Abdominal: Soft. Bowel sounds are normal. He exhibits  no distension. There is no tenderness.  Ext: left knee has well healed incision. Still has some associated swelling from surgery Skin: Skin is warm and dry. Scarring from repeated surgeries to left knee Psychiatric: He has a normal mood and affect. His behavior is normal.    CBC Lab Results  Component Value Date   WBC 9.7 10/20/2015   RBC 3.94 (L) 10/20/2015   HGB 11.0 (L) 10/20/2015   HCT 34.5 (L) 10/20/2015   PLT 308 10/20/2015   MCV 87.6 10/20/2015   MCH 27.9 10/20/2015   MCHC  31.9 10/20/2015   RDW 15.0 10/20/2015   LYMPHSABS 2.0 08/11/2015   MONOABS 0.6 08/11/2015   EOSABS 0.5 08/11/2015   BASOSABS 0.0 08/11/2015   BMET Lab Results  Component Value Date   NA 135 10/20/2015   K 4.5 10/20/2015   CL 100 (L) 10/20/2015   CO2 28 10/20/2015   GLUCOSE 116 (H) 10/20/2015   BUN 12 10/20/2015   CREATININE 0.69 10/20/2015   CALCIUM 9.8 10/20/2015   GFRNONAA >60 10/20/2015   GFRAA >60 10/20/2015     Assessment and Plan  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 Can pull picc line at that time - will see back in 2 wk

## 2015-11-12 ENCOUNTER — Ambulatory Visit: Payer: Medicare Other | Admitting: Internal Medicine

## 2016-01-27 ENCOUNTER — Ambulatory Visit: Payer: Medicare Other | Admitting: Internal Medicine

## 2016-02-10 ENCOUNTER — Ambulatory Visit: Payer: Self-pay | Admitting: Podiatry

## 2016-03-07 ENCOUNTER — Telehealth: Payer: Self-pay

## 2016-03-07 NOTE — Telephone Encounter (Signed)
Pt's PCP advised him to come in and be seen here at [facility] prior to his follow up appointment at their office. Pt had his caregiver Ms.Janice Rodel set up his appointment and confirm dates and times. Mrs. Rotel was given a date and stated compliance with keeping appointment.

## 2016-03-09 ENCOUNTER — Ambulatory Visit: Payer: Self-pay | Admitting: Podiatry

## 2016-03-14 ENCOUNTER — Encounter: Payer: Self-pay | Admitting: Podiatry

## 2016-03-14 ENCOUNTER — Ambulatory Visit (INDEPENDENT_AMBULATORY_CARE_PROVIDER_SITE_OTHER): Payer: Medicare Other | Admitting: Podiatry

## 2016-03-14 DIAGNOSIS — L608 Other nail disorders: Secondary | ICD-10-CM

## 2016-03-14 DIAGNOSIS — M79609 Pain in unspecified limb: Secondary | ICD-10-CM

## 2016-03-14 DIAGNOSIS — B351 Tinea unguium: Secondary | ICD-10-CM

## 2016-03-14 DIAGNOSIS — L603 Nail dystrophy: Secondary | ICD-10-CM

## 2016-03-26 NOTE — Progress Notes (Signed)
   SUBJECTIVE Patient  presents to office today complaining of elongated, thickened nails. Pain while ambulating in shoes. Patient is unable to trim their own nails.   OBJECTIVE General Patient is awake, alert, and oriented x 3 and in no acute distress. Derm Skin is dry and supple bilateral. Negative open lesions or macerations. Remaining integument unremarkable. Nails are tender, long, thickened and dystrophic with subungual debris, consistent with onychomycosis, 1-5 bilateral. No signs of infection noted. Vasc  DP and PT pedal pulses palpable bilaterally. Temperature gradient within normal limits.  Neuro Epicritic and protective threshold sensation diminished bilaterally.  Musculoskeletal Exam No symptomatic pedal deformities noted bilateral. Muscular strength within normal limits.  ASSESSMENT 1. Onychodystrophic nails 1-5 bilateral with hyperkeratosis of nails.  2. Onychomycosis of nail due to dermatophyte bilateral 3. Pain in foot bilateral  PLAN OF CARE 1. Patient evaluated today.  2. Instructed to maintain good pedal hygiene and foot care.  3. Mechanical debridement of nails 1-5 bilaterally performed using a nail nipper. Filed with dremel without incident.  4. Return to clinic in 3 mos.    Brent M. Evans, DPM [facility]  Dr. Brent M. Evans, DPM    [address]                                        [place], [place] [address]                Office [phone]  Fax [phone]      

## 2016-03-31 ENCOUNTER — Encounter: Payer: Self-pay | Admitting: Internal Medicine

## 2016-03-31 ENCOUNTER — Ambulatory Visit (INDEPENDENT_AMBULATORY_CARE_PROVIDER_SITE_OTHER): Payer: Medicare Other | Admitting: Internal Medicine

## 2016-03-31 VITALS — BP 102/76 | HR 93 | Temp 98.4°F

## 2016-03-31 DIAGNOSIS — M869 Osteomyelitis, unspecified: Secondary | ICD-10-CM

## 2016-03-31 NOTE — Progress Notes (Signed)
Rfv: follow up on left knee arthrodesis for infected total knee  Patient ID: [name], male   DOB: [birth year], 61 y.o.   MRN: [id number]  HPI [name] has had complicated course of healing and infection from prior surgery to correct injury originally sustained from trauma. He underwent left knee arthrodesis on October 12, 2015.  He was on Iv abtx for extended period of time of 4 wk after his surgery with vancomycin and ertapenem. He finished his course of abtx on 11/09/2015. His OR cultures at enterobacter, amp S enterococcus as well as strep pneumo. His inflammatory markers normalized prior to surgery while he was on abtx. He is back in clinic follow up off of abtx for roughly 3 1/2 months. He initially was to come back to clinic but has missed appointments, only now available to come to clinic.  He states he has some difficulties since his last surgery where he has developed left foot drop,as well as unable to move his toes. Needs assistance with getting out of bed/wheelchair. He has been able to follow up with Dr Eulah Pont to address this as well.  Outpatient Encounter Prescriptions as of 03/31/2016  Medication Sig  . albuterol (PROVENTIL HFA;VENTOLIN HFA) 108 (90 BASE) MCG/ACT inhaler Inhale 1-2 puffs into the lungs every 6 (six) hours as needed for wheezing.  Marland Kitchen aspirin 325 MG tablet Take 1 tablet (325 mg total) by mouth daily.  Marland Kitchen lisinopril (PRINIVIL,ZESTRIL) 20 MG tablet Take 20 mg by mouth every morning.   . methocarbamol (ROBAXIN) 500 MG tablet Take 1 tablet (500 mg total) by mouth every 6 (six) hours as needed for muscle spasms.  Marland Kitchen oxyCODONE (OXY IR/ROXICODONE) 5 MG immediate release tablet Take 1-2 tablets (5-10 mg total) by mouth every 3 (three) hours as needed for breakthrough pain.  . [DISCONTINUED] omeprazole (PRILOSEC) 20 MG capsule Take 1 capsule (20 mg total) by mouth daily.  Marland Kitchen acetaminophen (TYLENOL) 325 MG tablet Take 325 mg by mouth every 6 (six) hours as needed for moderate  pain.  . [DISCONTINUED] aspirin EC 325 MG tablet Take 1 tablet (325 mg total) by mouth daily.  . [DISCONTINUED] Heparin Lock Flush (HEPARIN, PORCINE, LOCK FLUSH IV) Inject 5 mLs into the vein. Flush with 5 mL after ABT use  . [DISCONTINUED] ondansetron (ZOFRAN) 4 MG tablet Take 1 tablet (4 mg total) by mouth every 8 (eight) hours as needed for nausea or vomiting.   No facility-administered encounter medications on file as of 03/31/2016.      Patient Active Problem List   Diagnosis Date Noted  . Knee osteoarthritis 10/12/2015  . Gram-negative infection   . Surgical wound dehiscence 07/30/2015  . Wound dehiscence, surgical 07/29/2015  . Tobacco abuse 07/29/2015  . 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 Wound dehiscence 07/19/2015  . Rupture of left patellar tendon, open, post-total knee replacement 07/19/2015  . Patellar tendon rupture 07/19/2015  . Primary osteoarthritis of left knee 06/18/2015  . Chronic obstructive airway disease with asthma (HCC) 06/18/2015  . Essential hypertension 06/18/2015  . Special screening for malignant neoplasms, colon 12/11/2012     Health Maintenance Due  Topic Date Due  . INFLUENZA VACCINE  09/15/2015     Review of Systems Review of Systems  Negative except what is mentioned above. No warmth to knee or swelling. No fever, chills, nightsweats, or diarrhea.    Physical Exam   BP 102/76   Pulse 93   Temp 98.4 F (36.9 C) (Oral)   gen =  a xo by 3 in NAD Ext: Left knee surgical incision is well healed but cooler sensation to feet Neuro = no dorsiflexion and plantarflexion of left foot Lab Results  Component Value Date   LABRPR Non Reactive 08/03/2015    CBC Lab Results  Component Value Date   WBC 9.7 10/20/2015   RBC 3.94 (L) 10/20/2015   HGB 11.0 (L) 10/20/2015   HCT 34.5 (L) 10/20/2015   PLT 308 10/20/2015   MCV 87.6 10/20/2015   MCH 27.9 10/20/2015   MCHC 31.9 10/20/2015   RDW 15.0 10/20/2015   LYMPHSABS 2.0 08/11/2015     MONOABS 0.6 08/11/2015   EOSABS 0.5 08/11/2015    BMET Lab Results  Component Value Date   NA 135 10/20/2015   K 4.5 10/20/2015   CL 100 (L) 10/20/2015   CO2 28 10/20/2015   GLUCOSE 116 (H) 10/20/2015   BUN 12 10/20/2015   CREATININE 0.69 10/20/2015   CALCIUM 9.8 10/20/2015   GFRNONAA >60 10/20/2015   GFRAA >60 10/20/2015   Lab Results  Component Value Date   ESRSEDRATE 1 03/31/2016   Lab Results  Component Value Date   CRP 4.1 03/31/2016     Assessment and Plan   History of prosthetic joint infection = will check sed rate and crp but based on history not likely to need further abtx  Foot drop = recommend at this appointment next week to discuss with PCP to see if needs further work up and assessment since I would not think he would have foot drop after his knee fusion. also consider peripheral vascular work up

## 2016-04-01 LAB — C-REACTIVE PROTEIN: CRP: 4.1 mg/L (ref <8.0)

## 2016-04-01 LAB — SEDIMENTATION RATE: Sed Rate: 1 mm/hr (ref 0–20)

## 2016-04-04 ENCOUNTER — Ambulatory Visit: Payer: Medicare Other | Admitting: Infectious Diseases

## 2016-05-04 ENCOUNTER — Ambulatory Visit: Payer: Medicare Other | Admitting: Internal Medicine

## 2016-06-01 ENCOUNTER — Encounter: Payer: Self-pay | Admitting: Neurology

## 2016-06-02 ENCOUNTER — Other Ambulatory Visit: Payer: Self-pay | Admitting: *Deleted

## 2016-06-02 DIAGNOSIS — M21372 Foot drop, left foot: Secondary | ICD-10-CM

## 2016-06-13 ENCOUNTER — Ambulatory Visit: Payer: Medicare Other | Admitting: Podiatry

## 2016-06-21 ENCOUNTER — Encounter: Payer: Medicare Other | Admitting: Neurology

## 2016-07-05 ENCOUNTER — Ambulatory Visit (INDEPENDENT_AMBULATORY_CARE_PROVIDER_SITE_OTHER): Payer: Medicare Other | Admitting: Neurology

## 2016-07-05 DIAGNOSIS — M21372 Foot drop, left foot: Secondary | ICD-10-CM

## 2016-07-05 DIAGNOSIS — G5732 Lesion of lateral popliteal nerve, left lower limb: Secondary | ICD-10-CM

## 2016-07-05 NOTE — Procedures (Signed)
[facility]  [address], [address]  [place], [place] [address] Tel: [phone] Fax:  [phone] Test Date:  07/05/2016  [name] DOB: 09/16/1955 Physician: Nita Sickleonika Patel, DO  Sex: Male Height: 5\' 8"  Ref Phys: Margarita Ranaimothy Murphy, M.D.  ID#: [id number] Temp: 31.0C Technician:    Patient Complaints: This is a 61 year-old man with history of left knee fusion complicated by prostethic infection referred for evaluation of left foot drop.  NCV & EMG Findings: Extensive electrodiagnostic testing of the left lower extremity and additional studies of the right shows:  1. Left superficial peroneal sensory response is absent. Left sural and right superficial peroneal sensory responses are within normal limits. 2. Left peroneal motor response is absent at the extensor digitorum brevis and tibialis anterior. Right peroneal motor response is absent at the extensor digitorum brevis, and normal at the tibialis anterior. Left tibial motor responses within normal limits. 3. Left tibial H reflex study is within normal limits. 4. Active motor axon loss changes are seen in the left anterior tibialis, fibularis longus, and extensor hallucis longus muscles; despite maximal activation, there is no motor unit recruitment in these muscles as well as the flexor digitorum longus.    Impression: 1. 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  2. Absent motor unit recruitment in the left flexor digitorum longus is of unclear etiology as the remaining tibial-innervated muscles show normal motor unit recruitment and configuration. A superimposed L5 radiculopathy is considered, however less likely with normal motor units in the gluteus medius muscle. Correlate clinically.   ___________________________ Nita Sickleonika Patel, DO    Nerve Conduction Studies Anti Sensory Summary Table   Site NR Peak (ms) Norm Peak (ms) P-T Amp (V) Norm P-T Amp  Left  Sup Peroneal Anti Sensory (Ant Lat Mall)  31C  12 cm NR  <4.6  >3  Right Sup Peroneal Anti Sensory (Ant Lat Mall)  31C  12 cm    3.0 <4.6 6.2 >3  Left Sural Anti Sensory (Lat Mall)  31C  Calf    3.0 <4.6 7.4 >3   Motor Summary Table   Site NR Onset (ms) Norm Onset (ms) O-P Amp (mV) Norm O-P Amp Site1 Site2 Delta-0 (ms) Dist (cm) Vel (m/s) Norm Vel (m/s)  Left Peroneal Motor (Ext Dig Brev)  31C  Ankle NR  <6.0  >2.5 B Fib Ankle  0.0  >40  B Fib NR     Poplt B Fib  0.0  >40  Poplt NR            Right Peroneal Motor (Ext Dig Brev)  31C  Ankle NR  <6.0  >2.5 B Fib Ankle  0.0  >40  B Fib NR     Poplt B Fib  0.0  >40  Poplt NR            Left Peroneal TA Motor (Tib Ant)  31C  Fib Head NR  <4.5  >3 Poplit Fib Head  0.0  >40  Poplit NR            Right Peroneal TA Motor (Tib Ant)  31C  Fib Head    4.5 <4.5 5.9 >3 Poplit Fib Head 1.2 8.0 67 >40  Poplit    5.7  6.2         Left Tibial Motor (Abd Hall Brev)  31C  Ankle    5.6 <6.0 10.2 >4 Knee Ankle 8.9 37.0 42 >40  Knee  14.5  6.1          H Reflex Studies   NR H-Lat (ms) Lat Norm (ms) L-R H-Lat (ms)  Left Tibial (Gastroc)  31C     34.69 <35    EMG   Side Muscle Ins Act Fibs Psw Fasc Number Recrt Dur Dur. Amp Amp. Poly Poly. Comment  Left RectFemoris Nml Nml Nml Nml 2- Mod-V Nml Nml Nml Nml Nml Nml N/A  Left AntTibialis Nml 2+ Nml Nml NE None - - - - - - ATR  Left ExtHallLong Nml 2+ Nml Nml NE None - - - - - - ATR  Left Fibularis Long Nml 2+ Nml Nml NE None - - - - - - ATR  Left GluteusMed Nml Nml Nml Nml 1- Mod-V Nml Nml Nml Nml Nml Nml N/A  Left BicepsFemS Nml Nml Nml Nml 1- Mod-V Nml Nml Nml Nml Nml Nml N/A  Left Flex Dig Long Nml Nml Nml Nml NE None - - - - - - ATR  Left Gastroc Nml Nml Nml Nml 3- Mod-V Nml Nml Nml Nml Nml Nml N/A      Waveforms:

## 2016-12-05 ENCOUNTER — Ambulatory Visit (INDEPENDENT_AMBULATORY_CARE_PROVIDER_SITE_OTHER): Payer: Medicare Other | Admitting: Podiatry

## 2016-12-05 DIAGNOSIS — B351 Tinea unguium: Secondary | ICD-10-CM | POA: Diagnosis not present

## 2016-12-05 DIAGNOSIS — M79609 Pain in unspecified limb: Secondary | ICD-10-CM | POA: Diagnosis not present
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
## 2017-04-06 ENCOUNTER — Encounter (HOSPITAL_COMMUNITY): Payer: Self-pay | Admitting: Emergency Medicine

## 2017-04-06 ENCOUNTER — Emergency Department (HOSPITAL_COMMUNITY): Payer: Medicare Other

## 2017-04-06 ENCOUNTER — Emergency Department (HOSPITAL_COMMUNITY)
Admission: EM | Admit: 2017-04-06 | Discharge: 2017-04-06 | Disposition: A | Discharge status: Home / Self Care | Payer: Medicare Other | Attending: Emergency Medicine | Admitting: Emergency Medicine

## 2017-04-06 ENCOUNTER — Other Ambulatory Visit: Payer: Self-pay

## 2017-04-06 DIAGNOSIS — J449 Chronic obstructive pulmonary disease, unspecified: Secondary | ICD-10-CM | POA: Diagnosis not present

## 2017-04-06 DIAGNOSIS — B029 Zoster without complications: Secondary | ICD-10-CM | POA: Insufficient documentation

## 2017-04-06 DIAGNOSIS — Z7982 Long term (current) use of aspirin: Secondary | ICD-10-CM | POA: Insufficient documentation

## 2017-04-06 DIAGNOSIS — J45909 Unspecified asthma, uncomplicated: Secondary | ICD-10-CM | POA: Diagnosis not present

## 2017-04-06 DIAGNOSIS — R05 Cough: Secondary | ICD-10-CM | POA: Insufficient documentation

## 2017-04-06 DIAGNOSIS — I10 Essential (primary) hypertension: Secondary | ICD-10-CM | POA: Insufficient documentation

## 2017-04-06 DIAGNOSIS — R059 Cough, unspecified: Secondary | ICD-10-CM

## 2017-04-06 DIAGNOSIS — Z96652 Presence of left artificial knee joint: Secondary | ICD-10-CM | POA: Insufficient documentation

## 2017-04-06 DIAGNOSIS — F17210 Nicotine dependence, cigarettes, uncomplicated: Secondary | ICD-10-CM | POA: Diagnosis not present

## 2017-04-06 DIAGNOSIS — Z79899 Other long term (current) drug therapy: Secondary | ICD-10-CM | POA: Insufficient documentation

## 2017-04-06 LAB — CBC
HCT: 41.8 % (ref 39.0–52.0)
Hemoglobin: 13.7 g/dL (ref 13.0–17.0)
MCH: 31.8 pg (ref 26.0–34.0)
MCHC: 32.8 g/dL (ref 30.0–36.0)
MCV: 97 fL (ref 78.0–100.0)
Platelets: 269 10*3/uL (ref 150–400)
RBC: 4.31 MIL/uL (ref 4.22–5.81)
RDW: 14.3 % (ref 11.5–15.5)
WBC: 8.4 10*3/uL (ref 4.0–10.5)

## 2017-04-06 LAB — BASIC METABOLIC PANEL
Anion gap: 10 (ref 5–15)
BUN: 9 mg/dL (ref 6–20)
CO2: 25 mmol/L (ref 22–32)
Calcium: 9.9 mg/dL (ref 8.9–10.3)
Chloride: 101 mmol/L (ref 101–111)
Creatinine, Ser: 1.09 mg/dL (ref 0.61–1.24)
GFR calc Af Amer: >60 mL/min (ref >60)
GFR calc non Af Amer: >60 mL/min (ref >60)
Glucose, Bld: 113 mg/dL — ABNORMAL HIGH (ref 65–99)
Potassium: 4.3 mmol/L (ref 3.5–5.1)
Sodium: 136 mmol/L (ref 135–145)

## 2017-04-06 LAB — I-STAT TROPONIN, ED: Troponin i, poc: 0 ng/mL (ref 0.00–0.08)

## 2017-04-06 MED ORDER — ACYCLOVIR 800 MG PO TABS: 800.0000 mg | ORAL_TABLET | Freq: Every day | ORAL | 0 refills | Status: DC

## 2017-04-06 MED ORDER — BENZONATATE 100 MG PO CAPS: 100.0000 mg | ORAL_CAPSULE | Freq: Three times a day (TID) | ORAL | 0 refills | Status: DC

## 2017-04-06 NOTE — Discharge Instructions (Signed)
Please read attached information. If you experience any new or worsening signs or symptoms please return to the emergency room for evaluation. Please follow-up with your primary care provider or specialist as discussed. Please use medication prescribed only as directed and discontinue taking if you have any concerning signs or symptoms.   °

## 2017-04-06 NOTE — ED Triage Notes (Signed)
Pt arrives via E[facility] from home states has had a cough for the last 2 years. Yesterday had pain in ribs with coughing on his left side and today with pain on the right. Pt has tried tylenol with little relief of symptoms. VSS.
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
## 2017-06-22 ENCOUNTER — Ambulatory Visit: Payer: Medicare Other | Admitting: Physical Therapy

## 2017-06-28 ENCOUNTER — Other Ambulatory Visit: Payer: Self-pay

## 2017-06-28 ENCOUNTER — Ambulatory Visit: Payer: Medicare Other | Attending: Orthopedic Surgery | Admitting: Physical Therapy

## 2017-06-28 ENCOUNTER — Encounter: Payer: Self-pay | Admitting: Physical Therapy

## 2017-06-28 DIAGNOSIS — M6281 Muscle weakness (generalized): Secondary | ICD-10-CM | POA: Diagnosis present

## 2017-06-28 DIAGNOSIS — M25672 Stiffness of left ankle, not elsewhere classified: Secondary | ICD-10-CM | POA: Insufficient documentation

## 2017-06-28 DIAGNOSIS — M25572 Pain in left ankle and joints of left foot: Secondary | ICD-10-CM | POA: Diagnosis present

## 2017-06-28 DIAGNOSIS — R262 Difficulty in walking, not elsewhere classified: Secondary | ICD-10-CM | POA: Diagnosis present
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
## 2017-07-12 ENCOUNTER — Ambulatory Visit: Payer: Medicare Other | Admitting: Physical Therapy

## 2017-07-12 ENCOUNTER — Encounter: Payer: Medicare Other | Admitting: Podiatry

## 2017-07-18 ENCOUNTER — Ambulatory Visit: Payer: Medicare Other | Attending: Orthopedic Surgery | Admitting: Physical Therapy

## 2017-07-18 DIAGNOSIS — M6281 Muscle weakness (generalized): Secondary | ICD-10-CM | POA: Insufficient documentation

## 2017-07-18 DIAGNOSIS — R262 Difficulty in walking, not elsewhere classified: Secondary | ICD-10-CM | POA: Insufficient documentation

## 2017-07-18 DIAGNOSIS — M25672 Stiffness of left ankle, not elsewhere classified: Secondary | ICD-10-CM | POA: Insufficient documentation

## 2017-07-18 DIAGNOSIS — M25572 Pain in left ankle and joints of left foot: Secondary | ICD-10-CM | POA: Insufficient documentation

## 2017-07-20 ENCOUNTER — Encounter: Payer: Self-pay | Admitting: Physical Therapy

## 2017-07-20 ENCOUNTER — Ambulatory Visit: Payer: Medicare Other | Admitting: Physical Therapy

## 2017-07-20 DIAGNOSIS — M6281 Muscle weakness (generalized): Secondary | ICD-10-CM

## 2017-07-20 DIAGNOSIS — M25572 Pain in left ankle and joints of left foot: Secondary | ICD-10-CM

## 2017-07-20 DIAGNOSIS — M25672 Stiffness of left ankle, not elsewhere classified: Secondary | ICD-10-CM

## 2017-07-20 DIAGNOSIS — R262 Difficulty in walking, not elsewhere classified: Secondary | ICD-10-CM
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
## 2017-07-20 NOTE — Progress Notes (Signed)
This encounter was created in error - please disregard.

## 2017-08-02 ENCOUNTER — Ambulatory Visit: Payer: Medicare Other | Admitting: Physical Therapy

## 2017-08-02 ENCOUNTER — Telehealth: Payer: Self-pay | Admitting: Physical Therapy

## 2017-08-02 NOTE — Telephone Encounter (Signed)
Spoke with patient regarding missed visit. Patient stated he could not get transportation. Therapy asked him to please call if he is going to miss. Per attendance policy his next visit was kept but his last visit was cancelled. He can schedule 1 visit at a time at this time. He reports he will be at his appointment.

## 2017-08-10 ENCOUNTER — Ambulatory Visit: Payer: Medicare Other | Admitting: Physical Therapy

## 2017-08-10 ENCOUNTER — Encounter: Payer: Self-pay | Admitting: Physical Therapy

## 2017-08-10 DIAGNOSIS — M25672 Stiffness of left ankle, not elsewhere classified: Secondary | ICD-10-CM

## 2017-08-10 DIAGNOSIS — M6281 Muscle weakness (generalized): Secondary | ICD-10-CM

## 2017-08-10 DIAGNOSIS — M25572 Pain in left ankle and joints of left foot: Secondary | ICD-10-CM

## 2017-08-10 DIAGNOSIS — R262 Difficulty in walking, not elsewhere classified: Secondary | ICD-10-CM
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
## 2017-08-15 ENCOUNTER — Encounter: Payer: Medicare Other | Admitting: Physical Therapy

## 2017-08-28 ENCOUNTER — Ambulatory Visit: Payer: Medicare Other | Attending: Orthopedic Surgery | Admitting: Physical Therapy

## 2017-10-17 IMAGING — CR DG CHEST 2V
2 series · 2 of 2 positions shown · non-contrast
Comparison: August 22, 2014.

CLINICAL DATA: Preop for left knee replacement.  Cough.

EXAM:
CHEST  2 VIEW

[w chest pa]
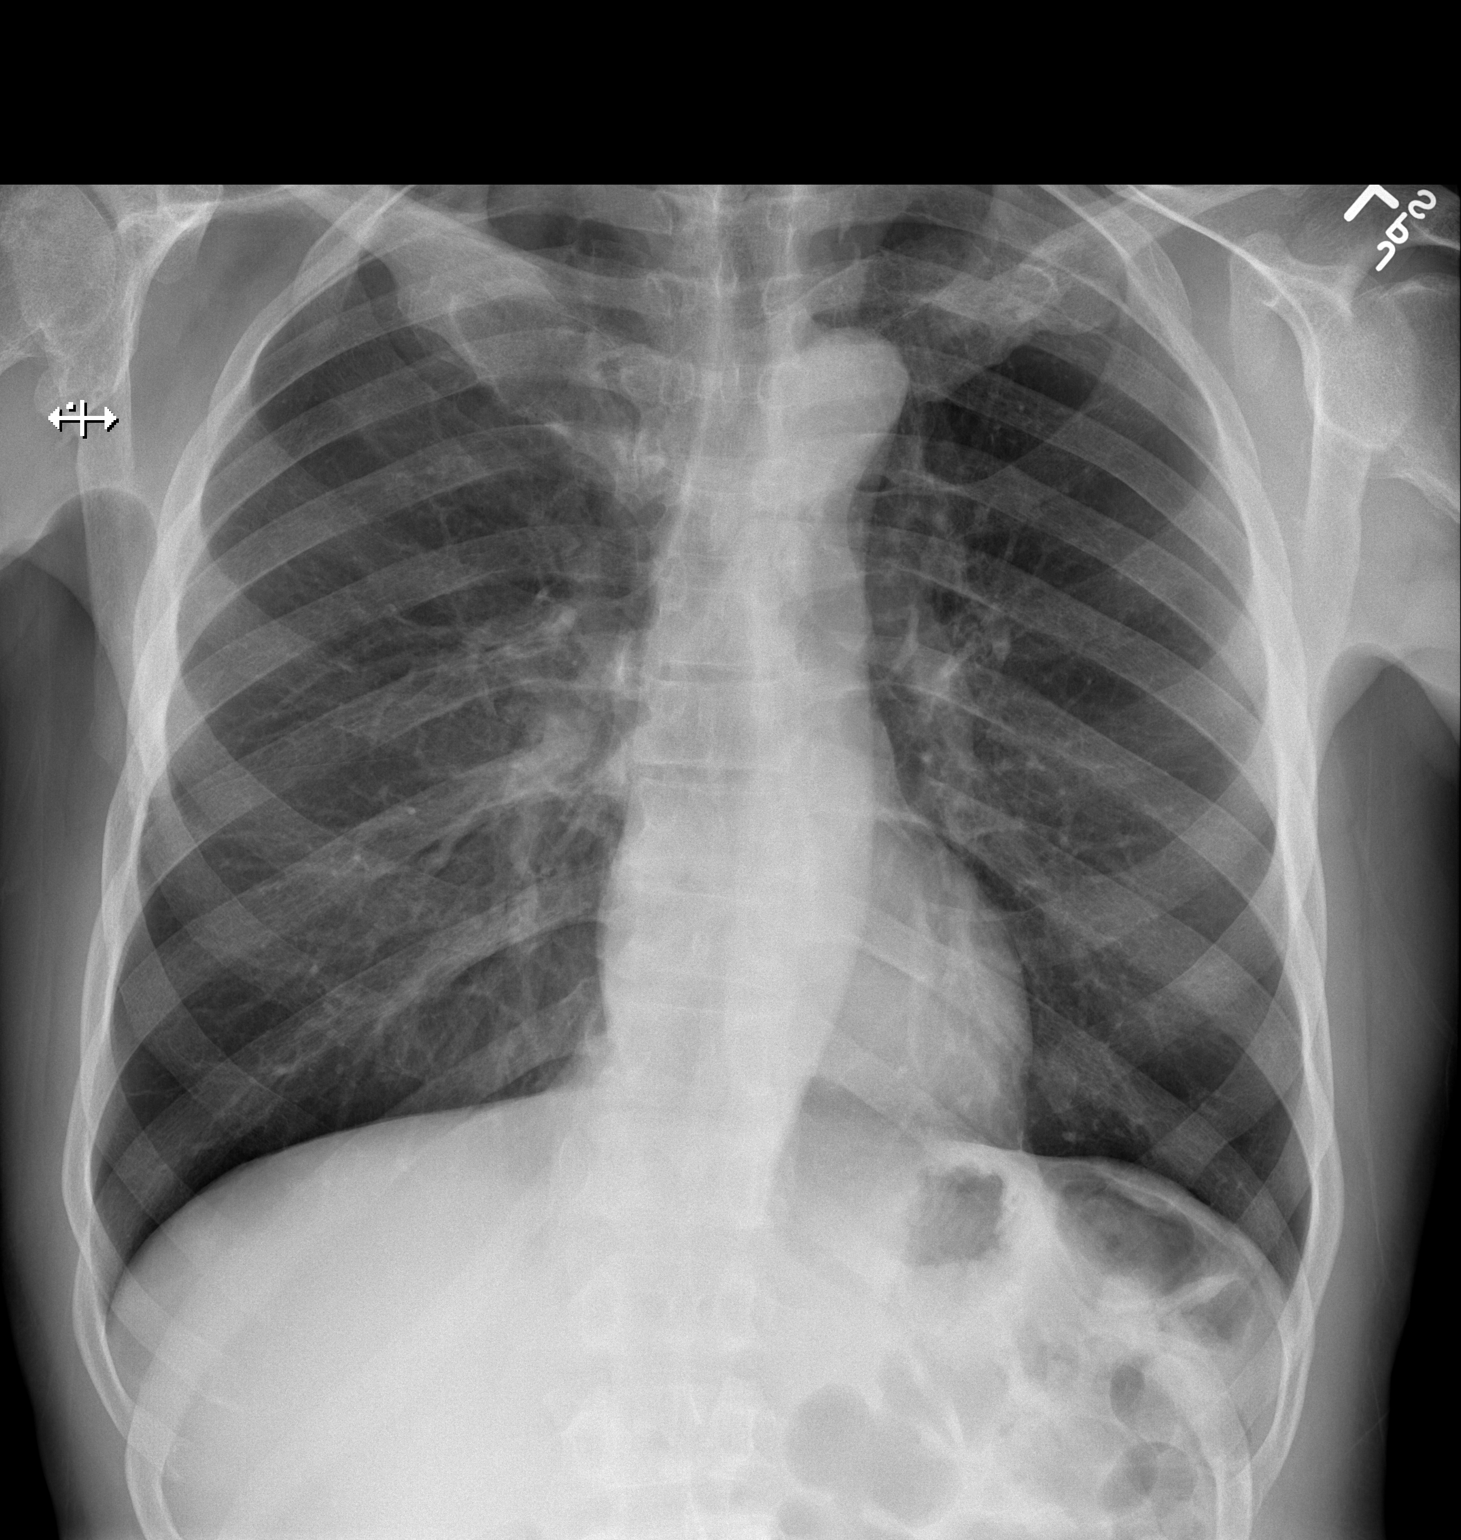

[w chest lat]
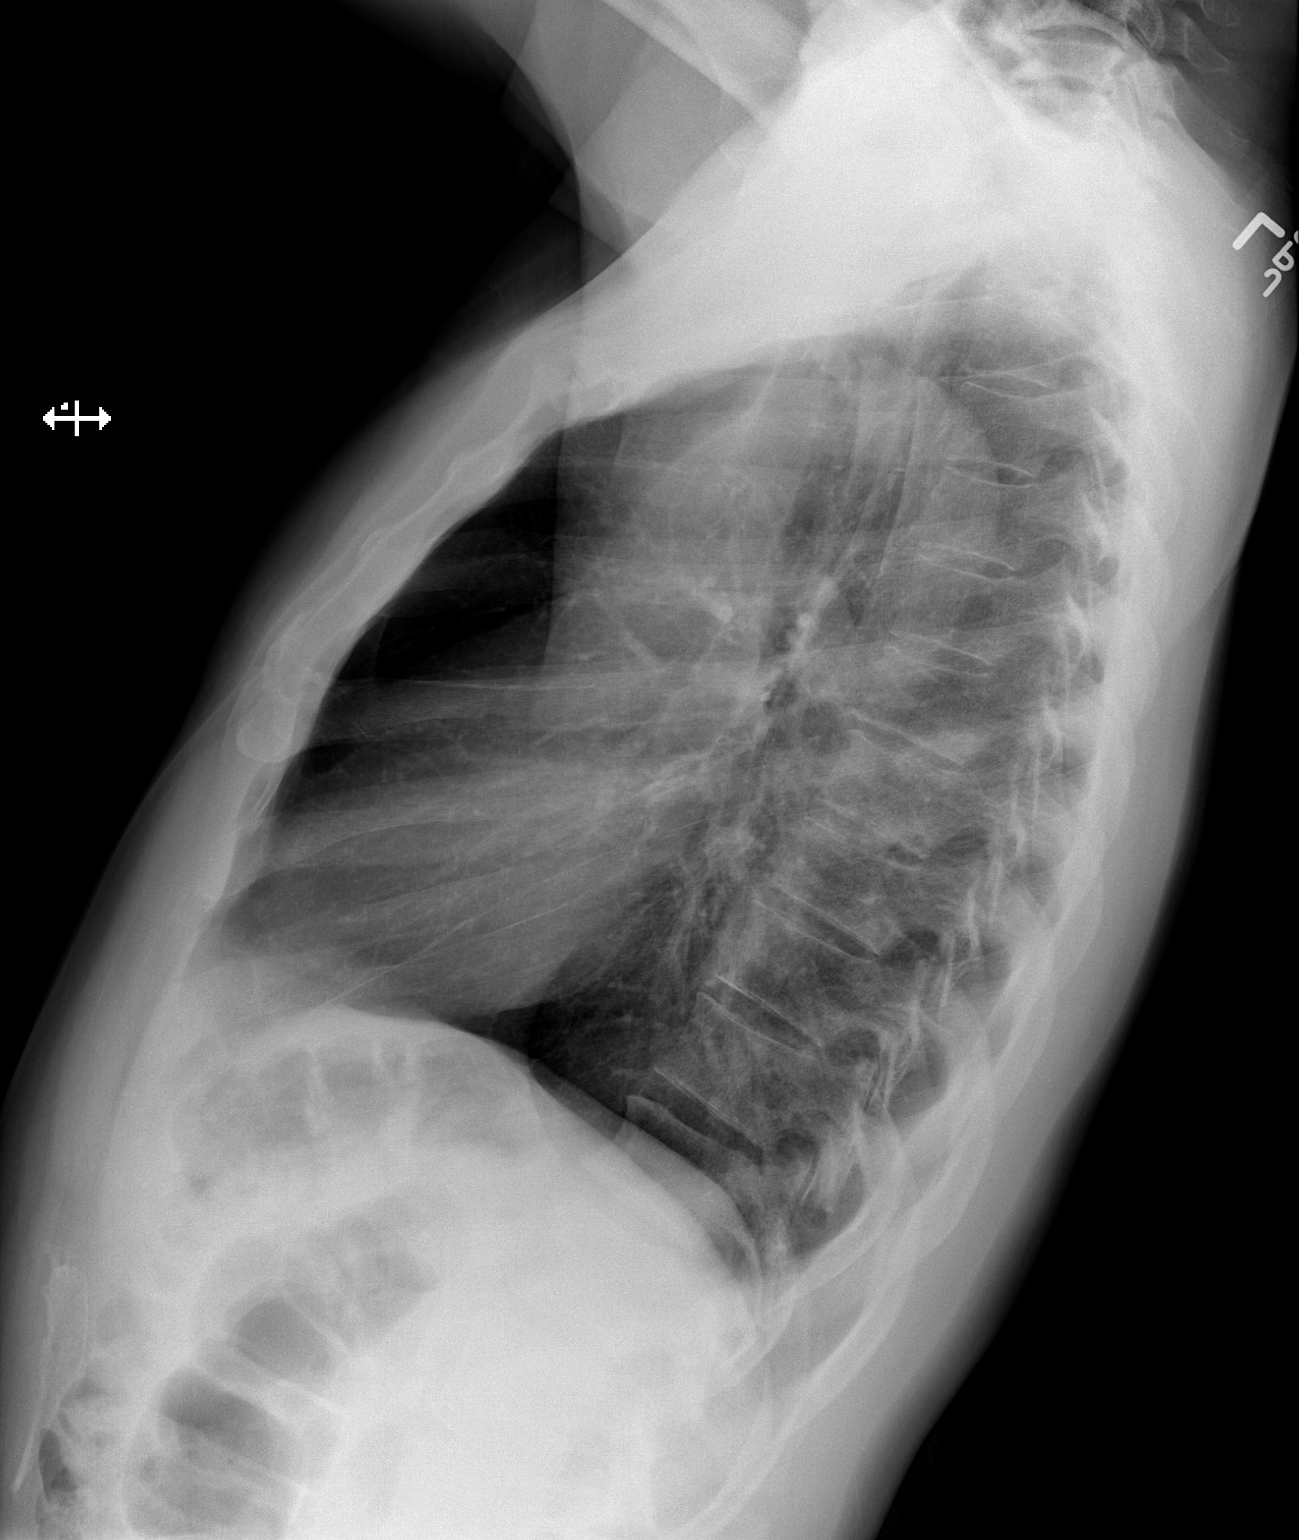

[2 of 2 positions shown; findings below may reference images not displayed]

FINDINGS: The heart size and mediastinal contours are within normal limits.
Both lungs are clear. No pneumothorax or pleural effusion is noted.
The visualized skeletal structures are unremarkable.
IMPRESSION: No active cardiopulmonary disease.

## 2017-10-28 IMAGING — CR DG KNEE 1-2V PORT*L*
2 series · 2 of 2 positions shown · non-contrast
Comparison: 04/27/2015

CLINICAL DATA: Osteoarthritis of left knee, postoperative for knee
replacement.

EXAM:
PORTABLE LEFT KNEE - 1-2 VIEW

[AP]
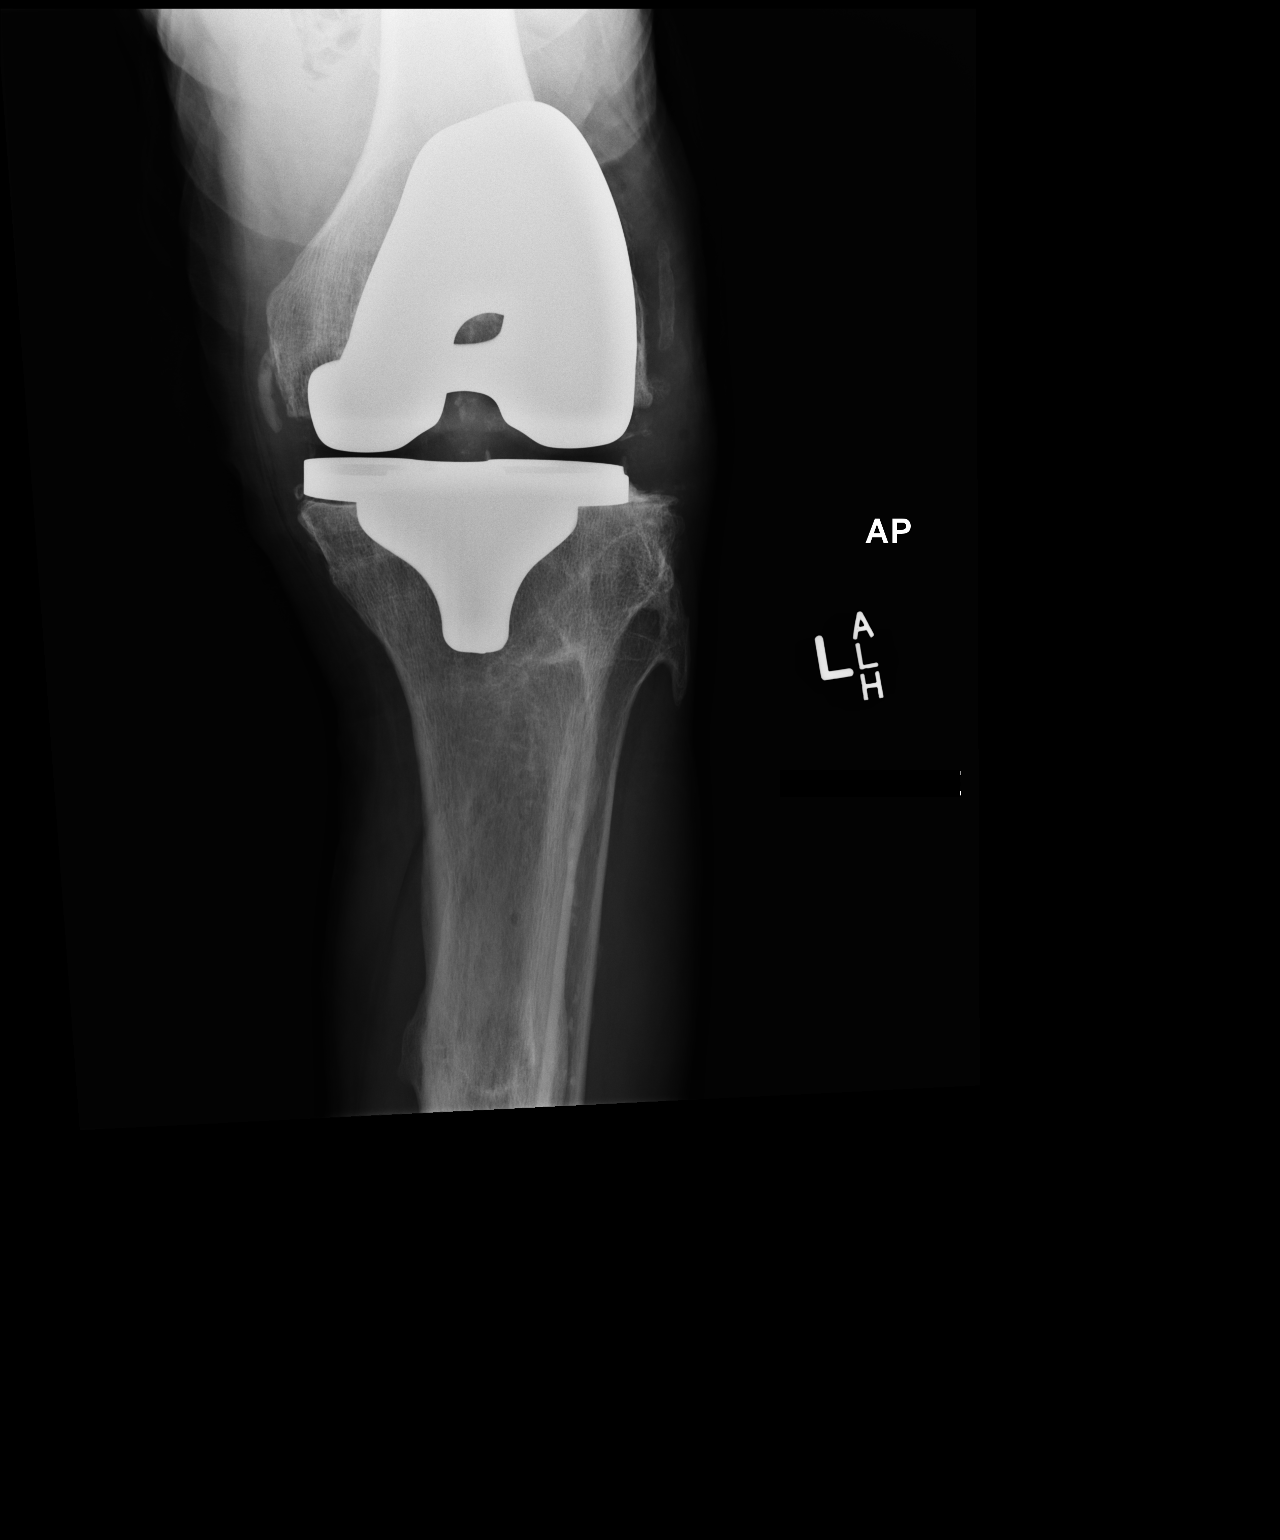

[xtable lateral]
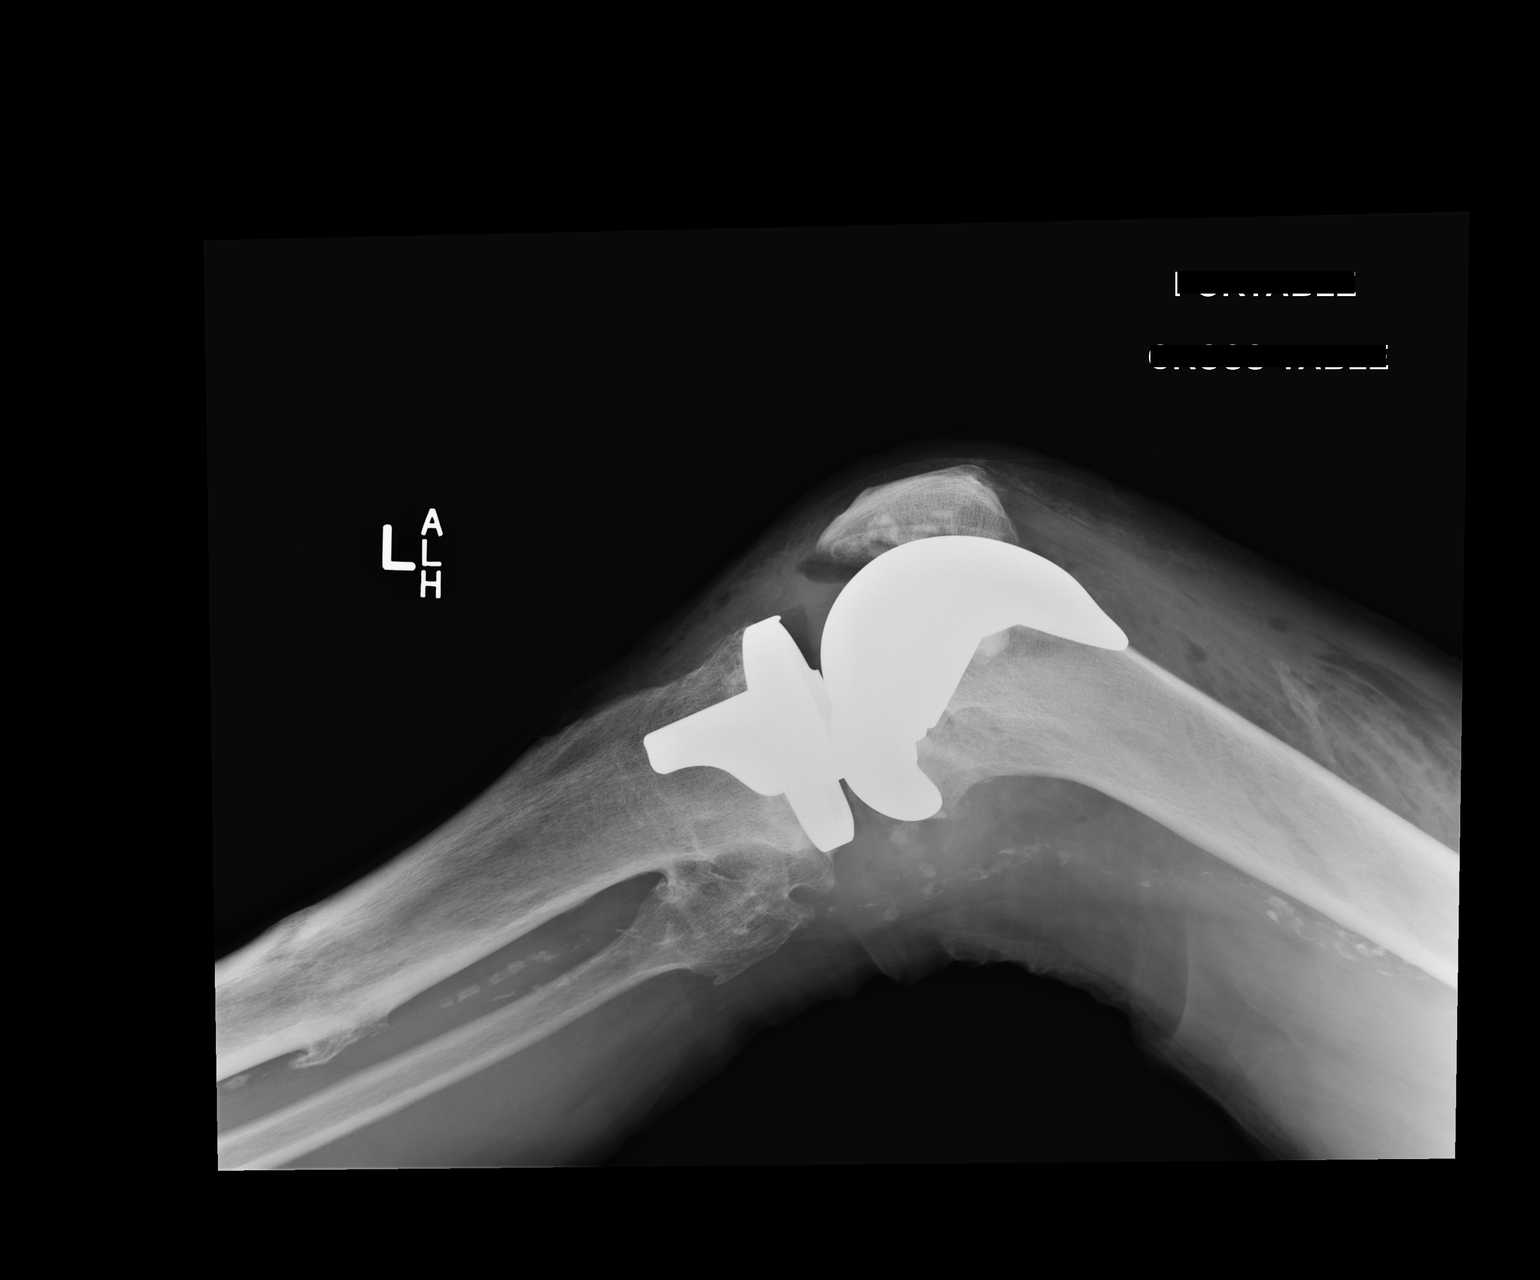

[2 of 2 positions shown; findings below may reference images not displayed]

FINDINGS: Total knee replacement observed. Approximately 1 mm of lucency
between the metal and bony interface of the medial tibial plateau on
the frontal projection. Calcification along the MCL favoring
Pellegrini-Stieda disease. Prominent spurring of the proximal
tibiofibular articulation. Deformity in the tibial shaft proximally
compatible with healed fracture.

Vascular calcifications.  Gas tracks along the quadriceps muscle.
IMPRESSION: 1. Approximately 1 mm of lucency between the medial tibial plateau
in the adjacent tray of the tibial component of the prosthesis.
2. Pellegrini-Stieda disease.
3. Healed fracture the proximal tibial shaft. Exuberant spurring at
the proximal tibiofibular articulation.
4. Vascular calcifications.
5. Gas tracks along the quadriceps musculature, likely incidental.

## 2017-11-21 ENCOUNTER — Encounter: Payer: Self-pay | Admitting: Physical Therapy

## 2017-12-08 ENCOUNTER — Ambulatory Visit: Payer: Medicare Other | Admitting: Podiatry

## 2017-12-29 ENCOUNTER — Ambulatory Visit: Payer: Medicare Other | Admitting: Podiatry

## 2018-01-10 ENCOUNTER — Ambulatory Visit (INDEPENDENT_AMBULATORY_CARE_PROVIDER_SITE_OTHER): Payer: Medicare Other | Admitting: Podiatry

## 2018-01-10 ENCOUNTER — Encounter: Payer: Self-pay | Admitting: Podiatry

## 2018-01-10 DIAGNOSIS — B351 Tinea unguium: Secondary | ICD-10-CM

## 2018-01-10 DIAGNOSIS — M79609 Pain in unspecified limb: Secondary | ICD-10-CM | POA: Diagnosis not present

## 2018-01-10 NOTE — Patient Instructions (Addendum)
Onychomycosis/Fungal Toenails  WHAT IS IT? An infection that lies within the keratin of your nail plate that is caused by a fungus.  WHY ME? Fungal infections affect all ages, sexes, races, and creeds.  There may be many factors that predispose you to a fungal infection such as age, coexisting medical conditions such as diabetes, or an autoimmune disease; stress, medications, fatigue, genetics, etc.  Bottom line: fungus thrives in a warm, moist environment and your shoes offer such a location.  IS IT CONTAGIOUS? Theoretically, yes.  You do not want to share shoes, nail clippers or files with someone who has fungal toenails.  Walking around barefoot in the same room or sleeping in the same bed is unlikely to transfer the organism.  It is important to realize, however, that fungus can spread easily from one nail to the next on the same foot.  HOW DO WE TREAT THIS?  There are several ways to treat this condition.  Treatment may depend on many factors such as age, medications, pregnancy, liver and kidney conditions, etc.  It is best to ask your doctor which options are available to you.  1. No treatment.   Unlike many other medical concerns, you can live with this condition.  However for many people this can be a painful condition and may lead to ingrown toenails or a bacterial infection.  It is recommended that you keep the nails cut short to help reduce the amount of fungal nail. 2. Topical treatment.  These range from herbal remedies to prescription strength nail lacquers.  About 40-50% effective, topicals require twice daily application for approximately 9 to 12 months or until an entirely new nail has grown out.  The most effective topicals are medical grade medications available through physicians offices. 3. Oral antifungal medications.  With an 80-90% cure rate, the most common oral medication requires 3 to 4 months of therapy and stays in your system for a year as the new nail grows out.  Oral  antifungal medications do require blood work to make sure it is a safe drug for you.  A liver function panel will be performed prior to starting the medication and after the first month of treatment.  It is important to have the blood work performed to avoid any harmful side effects.  In general, this medication safe but blood work is required. 4. Laser Therapy.  This treatment is performed by applying a specialized laser to the affected nail plate.  This therapy is noninvasive, fast, and non-painful.  It is not covered by insurance and is therefore, out of pocket.  The results have been very good with a 80-95% cure rate.  [facility] is the only practice in the area to offer this therapy. 5. Permanent Nail Avulsion.  Removing the entire nail so that a new nail will not grow back.  Corns and Calluses Corns are small areas of thickened skin that occur on the top, sides, or tip of a toe. They contain a cone-shaped core with a point that can press on a nerve below. This causes pain. Calluses are areas of thickened skin that can occur anywhere on the body including hands, fingers, palms, soles of the feet, and heels.Calluses are usually larger than corns. What are the causes? Corns and calluses are caused by rubbing (friction) or pressure, such as from shoes that are too tight or do not fit properly. What increases the risk? Corns are more likely to develop in people who have toe   deformities, such as hammer toes. Since calluses can occur with friction to any area of the skin, calluses are more likely to develop in people who:  Work with their hands.  Wear shoes that fit poorly, shoes that are too tight, or shoes that are high-heeled.  Have toes deformities.  What are the signs or symptoms? Symptoms of a corn or callus include:  A hard growth on the skin.  Pain or tenderness under the skin.  Redness and swelling.  Increased discomfort while wearing tight-fitting shoes.  How is this  diagnosed? Corns and calluses may be diagnosed with a medical history and physical exam. How is this treated? Corns and calluses may be treated with:  Removing the cause of the friction or pressure. This may include: ? Changing your shoes. ? Wearing shoe inserts (orthotics) or other protective layers in your shoes, such as a corn pad. ? Wearing gloves.  Medicines to help soften skin in the hardened, thickened areas.  Reducing the size of the corn or callus by removing the dead layers of skin.  Antibiotic medicines to treat infection.  Surgery, if a toe deformity is the cause.  Follow these instructions at home:  Take medicines only as directed by your health care provider.  If you were prescribed an antibiotic, finish all of it even if you start to feel better.  Wear shoes that fit well. Avoid wearing high-heeled shoes and shoes that are too tight or too loose.  Wear any padding, protective layers, gloves, or orthotics as directed by your health care provider.  Soak your hands or feet and then use a file or pumice stone to soften your corn or callus. Do this as directed by your health care provider.  Check your corn or callus every day for signs of infection. Watch for: ? Redness, swelling, or pain. ? Fluid, blood, or pus. Contact a health care provider if:  Your symptoms do not improve with treatment.  You have increased redness, swelling, or pain at the site of your corn or callus.  You have fluid, blood, or pus coming from your corn or callus.  You have new symptoms. This information is not intended to replace advice given to you by your health care provider. Make sure you discuss any questions you have with your health care provider. Document Released: 11/07/2003 Document Revised: 08/21/2015 Document Reviewed: 01/27/2014 Elsevier Interactive Patient Education  2018 [employer]  

## 2018-01-28 NOTE — Progress Notes (Signed)
Subjective: [name] presents today with painful, thick toenails 1-5 b/l that he cannot cut and which interfere with daily activities.  Pain is aggravated when wearing enclosed shoe gear.  Fleet ContrasAvbuere, Edwin, MD is his PCP.   Current Outpatient Medications:  .  acetaminophen (TYLENOL) 325 MG tablet, Take 325 mg by mouth every 6 (six) hours as needed for moderate pain., Disp: , Rfl:  .  acyclovir (ZOVIRAX) 800 MG tablet, Take 1 tablet (800 mg total) by mouth 5 (five) times daily., Disp: 35 tablet, Rfl: 0 .  albuterol (PROVENTIL HFA;VENTOLIN HFA) 108 (90 BASE) MCG/ACT inhaler, Inhale 1-2 puffs into the lungs every 6 (six) hours as needed for wheezing., Disp: 1 Inhaler, Rfl: 0 .  aspirin 325 MG tablet, Take 1 tablet (325 mg total) by mouth daily., Disp: 30 tablet, Rfl: 0 .  azithromycin (ZITHROMAX) 250 MG tablet, TK 2 TS PO  D FOR 1 DAY THEN 1 T QD FOR 4 DAYS, Disp: , Rfl: 0 .  benzonatate (TESSALON) 100 MG capsule, Take 1 capsule (100 mg total) by mouth every 8 (eight) hours., Disp: 21 capsule, Rfl: 0 .  HYDROcodone-acetaminophen (NORCO/VICODIN) 5-325 MG tablet, TK 1 T PO BID PRN P, Disp: , Rfl: 0 .  lisinopril (PRINIVIL,ZESTRIL) 20 MG tablet, Take 20 mg by mouth every morning. , Disp: , Rfl: 5 .  methocarbamol (ROBAXIN) 500 MG tablet, Take 1 tablet (500 mg total) by mouth every 6 (six) hours as needed for muscle spasms., Disp: 40 tablet, Rfl: 0 .  oxyCODONE (OXY IR/ROXICODONE) 5 MG immediate release tablet, Take 1-2 tablets (5-10 mg total) by mouth every 3 (three) hours as needed for breakthrough pain., Disp: 60 tablet, Rfl: 0 .  predniSONE (DELTASONE) 20 MG tablet, , Disp: , Rfl: 0 .  Vitamin D, Ergocalciferol, (DRISDOL) 1.25 MG (50000 UT) CAPS capsule, Take 50,000 Units by mouth once a week., Disp: , Rfl: 5  Allergies  Allergen Reactions  . No Known Allergies     Objective:  Vascular Examination: Capillary refill time immediate x 10 digits Dorsalis pedis and Posterior tibial pulses  palpable b/l Digital hair present x 10 digits Skin temperature gradient WNL b/l  Dermatological Examination: Skin with normal turgor, texture and tone b/l  Toenails 1-5 b/l discolored, thick, dystrophic with subungual debris and pain with palpation to nailbeds due to thickness of nails.  No open wounds.  No interdigital macerations.  Musculoskeletal: Muscle strength 5/5 to all LE muscle groups  Neurological: Sensation diminished with 10 gram monofilament.  Assessment: Painful onychomycosis toenails 1-5 b/l    Plan: 1. Toenails 1-5 b/l were debrided in length and girth without iatrogenic bleeding. 2. Patient to continue soft, supportive shoe gear 3. Patient to report any pedal injuries to medical professional immediately. 4. Follow up 3 months. Patient/POA to call should there be a concern in the interim.

## 2018-06-29 ENCOUNTER — Emergency Department (HOSPITAL_COMMUNITY)
Admission: EM | Admit: 2018-06-29 | Discharge: 2018-06-29 | Disposition: A | Discharge status: Home / Self Care | Payer: Medicare Other | Attending: Emergency Medicine | Admitting: Emergency Medicine

## 2018-06-29 ENCOUNTER — Other Ambulatory Visit: Payer: Self-pay

## 2018-06-29 ENCOUNTER — Emergency Department (HOSPITAL_COMMUNITY): Payer: Medicare Other

## 2018-06-29 ENCOUNTER — Encounter (HOSPITAL_COMMUNITY): Payer: Self-pay

## 2018-06-29 DIAGNOSIS — L03116 Cellulitis of left lower limb: Secondary | ICD-10-CM | POA: Diagnosis not present

## 2018-06-29 DIAGNOSIS — M79605 Pain in left leg: Secondary | ICD-10-CM | POA: Insufficient documentation

## 2018-06-29 DIAGNOSIS — M79672 Pain in left foot: Secondary | ICD-10-CM | POA: Diagnosis present

## 2018-06-29 DIAGNOSIS — Z79899 Other long term (current) drug therapy: Secondary | ICD-10-CM | POA: Insufficient documentation

## 2018-06-29 DIAGNOSIS — Z96652 Presence of left artificial knee joint: Secondary | ICD-10-CM | POA: Diagnosis not present

## 2018-06-29 DIAGNOSIS — Z23 Encounter for immunization: Secondary | ICD-10-CM | POA: Insufficient documentation

## 2018-06-29 DIAGNOSIS — J449 Chronic obstructive pulmonary disease, unspecified: Secondary | ICD-10-CM | POA: Insufficient documentation

## 2018-06-29 DIAGNOSIS — I10 Essential (primary) hypertension: Secondary | ICD-10-CM | POA: Insufficient documentation

## 2018-06-29 DIAGNOSIS — F17210 Nicotine dependence, cigarettes, uncomplicated: Secondary | ICD-10-CM | POA: Insufficient documentation

## 2018-06-29 MED ORDER — HYDROCODONE-ACETAMINOPHEN 5-325 MG PO TABS
1.0000 | ORAL_TABLET | Freq: Once | ORAL | Status: AC
  Administered 2018-06-29: 1 via ORAL
  Filled 2018-06-29: qty 1

## 2018-06-29 MED ORDER — TETANUS-DIPHTH-ACELL PERTUSSIS 5-2.5-18.5 LF-MCG/0.5 IM SUSP
0.5000 mL | Freq: Once | INTRAMUSCULAR | Status: AC
  Administered 2018-06-29: 0.5 mL via INTRAMUSCULAR
  Filled 2018-06-29: qty 0.5

## 2018-06-29 MED ORDER — CEPHALEXIN 500 MG PO CAPS: 500.0000 mg | ORAL_CAPSULE | Freq: Four times a day (QID) | ORAL | 0 refills | Status: AC

## 2018-06-29 MED ORDER — HYDROCODONE-ACETAMINOPHEN 5-325 MG PO TABS: 1.0000 | ORAL_TABLET | ORAL | 0 refills | Status: DC | PRN
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
## 2018-06-29 NOTE — Discharge Instructions (Signed)
Take Keflex as prescribed and complete the full course. Take Norco as needed as prescribed, do not drive or operate machinery while taking Norco. Follow up with your doctor Monday, return to the ER for any worsening or concerning symptoms.

## 2018-06-29 NOTE — ED Triage Notes (Addendum)
Per EMS- Patient reports that he had an orthopedic procedure completed a year ago on the left leg. Patient c/o pain on the bottom of the left foot. Pain worse yesterday and states it is affecting his mobility.

## 2018-06-29 NOTE — ED Notes (Signed)
Patient reports that he is unable to get home and does not have anyone to pick him up. Social worker Christiane Ha called to address concerned. Will see patient.

## 2018-06-29 NOTE — ED Notes (Signed)
Taxi to pick up patient

## 2018-06-29 NOTE — ED Notes (Signed)
Bed: WTR6 Expected date:  Expected time:  Means of arrival:  Comments: EMS-foot pain

## 2019-02-13 ENCOUNTER — Emergency Department (HOSPITAL_COMMUNITY): Payer: Medicare Other

## 2019-02-13 ENCOUNTER — Emergency Department (HOSPITAL_COMMUNITY)
Admission: EM | Admit: 2019-02-13 | Discharge: 2019-02-13 | Disposition: A | Discharge status: Home / Self Care | Payer: Medicare Other | Attending: Emergency Medicine | Admitting: Emergency Medicine

## 2019-02-13 ENCOUNTER — Encounter (HOSPITAL_COMMUNITY): Payer: Self-pay | Admitting: Emergency Medicine

## 2019-02-13 ENCOUNTER — Other Ambulatory Visit: Payer: Self-pay

## 2019-02-13 DIAGNOSIS — J189 Pneumonia, unspecified organism: Secondary | ICD-10-CM | POA: Diagnosis not present

## 2019-02-13 DIAGNOSIS — R0602 Shortness of breath: Secondary | ICD-10-CM | POA: Diagnosis present

## 2019-02-13 DIAGNOSIS — Z79899 Other long term (current) drug therapy: Secondary | ICD-10-CM | POA: Diagnosis not present

## 2019-02-13 DIAGNOSIS — Z20828 Contact with and (suspected) exposure to other viral communicable diseases: Secondary | ICD-10-CM | POA: Insufficient documentation

## 2019-02-13 DIAGNOSIS — J441 Chronic obstructive pulmonary disease with (acute) exacerbation: Secondary | ICD-10-CM

## 2019-02-13 DIAGNOSIS — F17210 Nicotine dependence, cigarettes, uncomplicated: Secondary | ICD-10-CM | POA: Insufficient documentation

## 2019-02-13 DIAGNOSIS — I10 Essential (primary) hypertension: Secondary | ICD-10-CM | POA: Insufficient documentation

## 2019-02-13 LAB — CBC
HCT: 34.1 % — ABNORMAL LOW (ref 39.0–52.0)
Hemoglobin: 9.6 g/dL — ABNORMAL LOW (ref 13.0–17.0)
MCH: 23.5 pg — ABNORMAL LOW (ref 26.0–34.0)
MCHC: 28.2 g/dL — ABNORMAL LOW (ref 30.0–36.0)
MCV: 83.6 fL (ref 80.0–100.0)
Platelets: 197 10*3/uL (ref 150–400)
RBC: 4.08 MIL/uL — ABNORMAL LOW (ref 4.22–5.81)
RDW: 19.8 % — ABNORMAL HIGH (ref 11.5–15.5)
WBC: 7.6 10*3/uL (ref 4.0–10.5)
nRBC: 0 % (ref 0.0–0.2)

## 2019-02-13 LAB — BASIC METABOLIC PANEL
Anion gap: 10 (ref 5–15)
BUN: 7 mg/dL — ABNORMAL LOW (ref 8–23)
CO2: 31 mmol/L (ref 22–32)
Calcium: 9.8 mg/dL (ref 8.9–10.3)
Chloride: 94 mmol/L — ABNORMAL LOW (ref 98–111)
Creatinine, Ser: 0.69 mg/dL (ref 0.61–1.24)
GFR calc Af Amer: >60 mL/min (ref >60)
GFR calc non Af Amer: >60 mL/min (ref >60)
Glucose, Bld: 100 mg/dL — ABNORMAL HIGH (ref 70–99)
Potassium: 4.4 mmol/L (ref 3.5–5.1)
Sodium: 135 mmol/L (ref 135–145)

## 2019-02-13 LAB — POC SARS CORONAVIRUS 2 AG -  ED: SARS Coronavirus 2 Ag: NEGATIVE

## 2019-02-13 MED ORDER — AZITHROMYCIN 250 MG PO TABS: 250.0000 mg | ORAL_TABLET | Freq: Every day | ORAL | 0 refills | Status: DC

## 2019-02-13 MED ORDER — PREDNISONE 20 MG PO TABS: ORAL_TABLET | ORAL | 0 refills | Status: DC

## 2019-02-13 MED ORDER — ALBUTEROL SULFATE HFA 108 (90 BASE) MCG/ACT IN AERS
2.0000 | INHALATION_SPRAY | Freq: Once | RESPIRATORY_TRACT | Status: AC
  Administered 2019-02-13: 2 via RESPIRATORY_TRACT
  Filled 2019-02-13: qty 6.7

## 2019-02-13 NOTE — ED Triage Notes (Signed)
Pt arrives via EMS with asthma and SOB. Unable to get medications. 125 mg solu-medrol, 5 albuterol given. Wears 3L at night but currently on due to O2 sats 88% on RA. Wheezing noted. 20G Lhand.

## 2019-02-13 NOTE — ED Notes (Signed)
Brought Pt back to RM in Wheelchair. Pt was suppose to be on O2 but O2 Tank was Empty. When Pt got in Bed Pt was at 82% on RM Air with Good Wave Form. Unknown how long Pt was without 02. Put Pt on 6L Nasal and O2 Slowly went Up to 100%. Put Pt back down to 2L and Pt is staying 100%

## 2019-02-13 NOTE — Discharge Instructions (Signed)
Take zpack as prescribed   Use albuterol every 4 hrs as needed for cough   Take prednisone as prescribed   Use oxygen as needed   See your doctor  Return to ER if you have worse shortness of breath, chest pain, fever, cough.

## 2019-02-13 NOTE — ED Notes (Signed)
Assisted pt with urinal per pt request.
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
## 2019-02-21 ENCOUNTER — Other Ambulatory Visit: Payer: Medicare Other

## 2019-02-25 ENCOUNTER — Encounter (HOSPITAL_COMMUNITY): Payer: Self-pay | Admitting: Emergency Medicine

## 2019-02-25 ENCOUNTER — Emergency Department (HOSPITAL_COMMUNITY)
Admission: EM | Admit: 2019-02-25 | Discharge: 2019-02-25 | Disposition: A | Discharge status: Home / Self Care | Payer: Medicare Other | Attending: Emergency Medicine | Admitting: Emergency Medicine

## 2019-02-25 ENCOUNTER — Emergency Department (HOSPITAL_COMMUNITY): Payer: Medicare Other

## 2019-02-25 ENCOUNTER — Other Ambulatory Visit: Payer: Self-pay

## 2019-02-25 DIAGNOSIS — Z7982 Long term (current) use of aspirin: Secondary | ICD-10-CM | POA: Insufficient documentation

## 2019-02-25 DIAGNOSIS — Z79899 Other long term (current) drug therapy: Secondary | ICD-10-CM | POA: Insufficient documentation

## 2019-02-25 DIAGNOSIS — Z96652 Presence of left artificial knee joint: Secondary | ICD-10-CM | POA: Insufficient documentation

## 2019-02-25 DIAGNOSIS — I10 Essential (primary) hypertension: Secondary | ICD-10-CM | POA: Insufficient documentation

## 2019-02-25 DIAGNOSIS — F17210 Nicotine dependence, cigarettes, uncomplicated: Secondary | ICD-10-CM | POA: Diagnosis not present

## 2019-02-25 DIAGNOSIS — J441 Chronic obstructive pulmonary disease with (acute) exacerbation: Secondary | ICD-10-CM

## 2019-02-25 DIAGNOSIS — R05 Cough: Secondary | ICD-10-CM | POA: Diagnosis not present

## 2019-02-25 DIAGNOSIS — R0602 Shortness of breath: Secondary | ICD-10-CM | POA: Diagnosis present

## 2019-02-25 MED ORDER — ALBUTEROL SULFATE HFA 108 (90 BASE) MCG/ACT IN AERS
4.0000 | INHALATION_SPRAY | Freq: Once | RESPIRATORY_TRACT | Status: AC
  Administered 2019-02-25: 05:00:00 4 via RESPIRATORY_TRACT
  Filled 2019-02-25: qty 6.7

## 2019-02-25 MED ORDER — PREDNISONE 20 MG PO TABS
60.0000 mg | ORAL_TABLET | Freq: Once | ORAL | Status: AC
  Administered 2019-02-25: 05:00:00 60 mg via ORAL
  Filled 2019-02-25: qty 3

## 2019-02-25 MED ORDER — ALBUTEROL SULFATE (2.5 MG/3ML) 0.083% IN NEBU: 5.0000 mg | INHALATION_SOLUTION | Freq: Once | RESPIRATORY_TRACT | Status: DC

## 2019-02-25 MED ORDER — PREDNISONE 50 MG PO TABS: ORAL_TABLET | ORAL | 0 refills | Status: DC
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
## 2019-02-25 NOTE — Discharge Instructions (Addendum)
Continue to use your inhaler as needed.  Return if you are having any problems.

## 2019-02-25 NOTE — ED Notes (Signed)
Discharge instructions discussed with pt. Pt verbalized understanding. Pt stable and ambulatory. No signature pad available. 

## 2019-04-02 ENCOUNTER — Other Ambulatory Visit: Payer: Self-pay

## 2019-04-02 ENCOUNTER — Emergency Department (HOSPITAL_COMMUNITY)
Admission: EM | Admit: 2019-04-02 | Discharge: 2019-04-03 | Disposition: A | Discharge status: Home / Self Care | Payer: Medicare Other | Attending: Emergency Medicine | Admitting: Emergency Medicine

## 2019-04-02 ENCOUNTER — Encounter (HOSPITAL_COMMUNITY): Payer: Self-pay | Admitting: Emergency Medicine

## 2019-04-02 ENCOUNTER — Emergency Department (HOSPITAL_COMMUNITY): Payer: Medicare Other

## 2019-04-02 DIAGNOSIS — J441 Chronic obstructive pulmonary disease with (acute) exacerbation: Secondary | ICD-10-CM | POA: Diagnosis not present

## 2019-04-02 DIAGNOSIS — R0602 Shortness of breath: Secondary | ICD-10-CM | POA: Diagnosis present

## 2019-04-02 DIAGNOSIS — Z20822 Contact with and (suspected) exposure to covid-19: Secondary | ICD-10-CM | POA: Insufficient documentation

## 2019-04-02 DIAGNOSIS — F17210 Nicotine dependence, cigarettes, uncomplicated: Secondary | ICD-10-CM | POA: Diagnosis not present

## 2019-04-02 DIAGNOSIS — I10 Essential (primary) hypertension: Secondary | ICD-10-CM | POA: Diagnosis not present

## 2019-04-02 LAB — CBC WITH DIFFERENTIAL/PLATELET
Abs Immature Granulocytes: 0.04 10*3/uL (ref 0.00–0.07)
Basophils Absolute: 0.1 10*3/uL (ref 0.0–0.1)
Basophils Relative: 1 %
Eosinophils Absolute: 0.1 10*3/uL (ref 0.0–0.5)
Eosinophils Relative: 1 %
HCT: 33.8 % — ABNORMAL LOW (ref 39.0–52.0)
Hemoglobin: 9.7 g/dL — ABNORMAL LOW (ref 13.0–17.0)
Immature Granulocytes: 0 %
Lymphocytes Relative: 10 %
Lymphs Abs: 1 10*3/uL (ref 0.7–4.0)
MCH: 23.5 pg — ABNORMAL LOW (ref 26.0–34.0)
MCHC: 28.7 g/dL — ABNORMAL LOW (ref 30.0–36.0)
MCV: 82 fL (ref 80.0–100.0)
Monocytes Absolute: 1 10*3/uL (ref 0.1–1.0)
Monocytes Relative: 11 %
Neutro Abs: 7.3 10*3/uL (ref 1.7–7.7)
Neutrophils Relative %: 77 %
Platelets: 306 10*3/uL (ref 150–400)
RBC: 4.12 MIL/uL — ABNORMAL LOW (ref 4.22–5.81)
RDW: 20.9 % — ABNORMAL HIGH (ref 11.5–15.5)
WBC: 9.4 10*3/uL (ref 4.0–10.5)
nRBC: 0 % (ref 0.0–0.2)

## 2019-04-02 LAB — COMPREHENSIVE METABOLIC PANEL
ALT: 29 U/L (ref 0–44)
AST: 31 U/L (ref 15–41)
Albumin: 4.2 g/dL (ref 3.5–5.0)
Alkaline Phosphatase: 75 U/L (ref 38–126)
Anion gap: 10 (ref 5–15)
BUN: 13 mg/dL (ref 8–23)
CO2: 32 mmol/L (ref 22–32)
Calcium: 10 mg/dL (ref 8.9–10.3)
Chloride: 94 mmol/L — ABNORMAL LOW (ref 98–111)
Creatinine, Ser: 0.94 mg/dL (ref 0.61–1.24)
GFR calc Af Amer: >60 mL/min (ref >60)
GFR calc non Af Amer: >60 mL/min (ref >60)
Glucose, Bld: 102 mg/dL — ABNORMAL HIGH (ref 70–99)
Potassium: 4 mmol/L (ref 3.5–5.1)
Sodium: 136 mmol/L (ref 135–145)
Total Bilirubin: 0.4 mg/dL (ref 0.3–1.2)
Total Protein: 7.4 g/dL (ref 6.5–8.1)

## 2019-04-02 MED ORDER — ALBUTEROL SULFATE HFA 108 (90 BASE) MCG/ACT IN AERS
2.0000 | INHALATION_SPRAY | Freq: Once | RESPIRATORY_TRACT | Status: AC
  Administered 2019-04-02: 2 via RESPIRATORY_TRACT
  Filled 2019-04-02: qty 6.7

## 2019-04-02 NOTE — ED Triage Notes (Signed)
Patient reports SOB with productive cough and chest congestion onset this evening , denies fever or chills , he ran out of his inhaler this week , history of COPD .

## 2019-04-03 DIAGNOSIS — J441 Chronic obstructive pulmonary disease with (acute) exacerbation: Secondary | ICD-10-CM | POA: Diagnosis not present

## 2019-04-03 LAB — POC SARS CORONAVIRUS 2 AG -  ED: SARS Coronavirus 2 Ag: NEGATIVE

## 2019-04-03 MED ORDER — ALBUTEROL SULFATE HFA 108 (90 BASE) MCG/ACT IN AERS
6.0000 | INHALATION_SPRAY | Freq: Once | RESPIRATORY_TRACT | Status: AC
  Administered 2019-04-03: 6 via RESPIRATORY_TRACT
  Filled 2019-04-03: qty 6.7

## 2019-04-03 MED ORDER — DEXAMETHASONE SODIUM PHOSPHATE 10 MG/ML IJ SOLN: 10.0000 mg | Freq: Once | INTRAMUSCULAR | Status: DC

## 2019-04-03 MED ORDER — ALBUTEROL (5 MG/ML) CONTINUOUS INHALATION SOLN
10.0000 mg/h | INHALATION_SOLUTION | Freq: Once | RESPIRATORY_TRACT | Status: AC
  Administered 2019-04-03: 10 mg/h via RESPIRATORY_TRACT
  Filled 2019-04-03: qty 20

## 2019-04-03 MED ORDER — ALBUTEROL SULFATE HFA 108 (90 BASE) MCG/ACT IN AERS: 2.0000 | INHALATION_SPRAY | Freq: Four times a day (QID) | RESPIRATORY_TRACT | 0 refills | Status: DC | PRN

## 2019-04-03 MED ORDER — DOXYCYCLINE HYCLATE 100 MG PO CAPS: 100.0000 mg | ORAL_CAPSULE | Freq: Two times a day (BID) | ORAL | 0 refills | Status: DC

## 2019-04-03 MED ORDER — DEXAMETHASONE SODIUM PHOSPHATE 10 MG/ML IJ SOLN
10.0000 mg | Freq: Once | INTRAMUSCULAR | Status: AC
  Administered 2019-04-03: 10 mg via INTRAMUSCULAR
  Filled 2019-04-03: qty 1

## 2019-04-03 MED ORDER — IPRATROPIUM-ALBUTEROL 0.5-2.5 (3) MG/3ML IN SOLN
3.0000 mL | Freq: Once | RESPIRATORY_TRACT | Status: AC
  Administered 2019-04-03: 3 mL via RESPIRATORY_TRACT
  Filled 2019-04-03: qty 3
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
## 2019-04-03 NOTE — ED Notes (Signed)
Ambulated patient on 5L oxygen, pt tolerated well. O2 saturation while ambulating was between 94% and 98%

## 2019-04-03 NOTE — ED Notes (Signed)
Respiratory called to start continuous nebulizer.

## 2019-04-03 NOTE — ED Notes (Signed)
Patient verbalizes understanding of discharge instructions. Opportunity for questioning and answers were provided. Armband removed by staff, pt discharged from ED.  

## 2019-04-03 NOTE — ED Notes (Signed)
Pt tolerating 2L well.

## 2019-04-10 ENCOUNTER — Ambulatory Visit: Payer: Medicare Other | Admitting: Podiatry

## 2019-04-13 ENCOUNTER — Emergency Department (HOSPITAL_COMMUNITY): Payer: Medicare Other

## 2019-04-13 ENCOUNTER — Other Ambulatory Visit: Payer: Self-pay

## 2019-04-13 ENCOUNTER — Emergency Department (HOSPITAL_COMMUNITY)
Admission: EM | Admit: 2019-04-13 | Discharge: 2019-04-14 | Disposition: A | Discharge status: Home / Self Care | Payer: Medicare Other | Attending: Emergency Medicine | Admitting: Emergency Medicine

## 2019-04-13 DIAGNOSIS — Z79899 Other long term (current) drug therapy: Secondary | ICD-10-CM | POA: Insufficient documentation

## 2019-04-13 DIAGNOSIS — Z7982 Long term (current) use of aspirin: Secondary | ICD-10-CM | POA: Insufficient documentation

## 2019-04-13 DIAGNOSIS — J441 Chronic obstructive pulmonary disease with (acute) exacerbation: Secondary | ICD-10-CM | POA: Insufficient documentation

## 2019-04-13 DIAGNOSIS — R0602 Shortness of breath: Secondary | ICD-10-CM | POA: Diagnosis present

## 2019-04-13 DIAGNOSIS — F17210 Nicotine dependence, cigarettes, uncomplicated: Secondary | ICD-10-CM | POA: Insufficient documentation

## 2019-04-13 DIAGNOSIS — I10 Essential (primary) hypertension: Secondary | ICD-10-CM | POA: Insufficient documentation

## 2019-04-13 LAB — CBC WITH DIFFERENTIAL/PLATELET
Abs Immature Granulocytes: 0.03 10*3/uL (ref 0.00–0.07)
Basophils Absolute: 0.1 10*3/uL (ref 0.0–0.1)
Basophils Relative: 1 %
Eosinophils Absolute: 0.3 10*3/uL (ref 0.0–0.5)
Eosinophils Relative: 4 %
HCT: 34.5 % — ABNORMAL LOW (ref 39.0–52.0)
Hemoglobin: 9.8 g/dL — ABNORMAL LOW (ref 13.0–17.0)
Immature Granulocytes: 1 %
Lymphocytes Relative: 15 %
Lymphs Abs: 1 10*3/uL (ref 0.7–4.0)
MCH: 24 pg — ABNORMAL LOW (ref 26.0–34.0)
MCHC: 28.4 g/dL — ABNORMAL LOW (ref 30.0–36.0)
MCV: 84.4 fL (ref 80.0–100.0)
Monocytes Absolute: 0.7 10*3/uL (ref 0.1–1.0)
Monocytes Relative: 10 %
Neutro Abs: 4.6 10*3/uL (ref 1.7–7.7)
Neutrophils Relative %: 69 %
Platelets: 158 10*3/uL (ref 150–400)
RBC: 4.09 MIL/uL — ABNORMAL LOW (ref 4.22–5.81)
RDW: 21.5 % — ABNORMAL HIGH (ref 11.5–15.5)
WBC: 6.6 10*3/uL (ref 4.0–10.5)
nRBC: 0 % (ref 0.0–0.2)

## 2019-04-13 LAB — COMPREHENSIVE METABOLIC PANEL
ALT: 18 U/L (ref 0–44)
AST: 21 U/L (ref 15–41)
Albumin: 3.7 g/dL (ref 3.5–5.0)
Alkaline Phosphatase: 71 U/L (ref 38–126)
Anion gap: 10 (ref 5–15)
BUN: 10 mg/dL (ref 8–23)
CO2: 31 mmol/L (ref 22–32)
Calcium: 9.7 mg/dL (ref 8.9–10.3)
Chloride: 96 mmol/L — ABNORMAL LOW (ref 98–111)
Creatinine, Ser: 0.62 mg/dL (ref 0.61–1.24)
GFR calc Af Amer: >60 mL/min (ref >60)
GFR calc non Af Amer: >60 mL/min (ref >60)
Glucose, Bld: 115 mg/dL — ABNORMAL HIGH (ref 70–99)
Potassium: 4 mmol/L (ref 3.5–5.1)
Sodium: 137 mmol/L (ref 135–145)
Total Bilirubin: 0.1 mg/dL — ABNORMAL LOW (ref 0.3–1.2)
Total Protein: 6.5 g/dL (ref 6.5–8.1)

## 2019-04-13 LAB — BRAIN NATRIURETIC PEPTIDE: B Natriuretic Peptide: 20.3 pg/mL (ref 0.0–100.0)

## 2019-04-13 MED ORDER — PREDNISONE 20 MG PO TABS
60.0000 mg | ORAL_TABLET | Freq: Once | ORAL | Status: AC
  Administered 2019-04-13: 60 mg via ORAL
  Filled 2019-04-13: qty 3

## 2019-04-13 MED ORDER — ALBUTEROL SULFATE (2.5 MG/3ML) 0.083% IN NEBU
5.0000 mg | INHALATION_SOLUTION | Freq: Once | RESPIRATORY_TRACT | Status: AC
  Administered 2019-04-13: 5 mg via RESPIRATORY_TRACT
  Filled 2019-04-13: qty 6

## 2019-04-13 MED ORDER — ALBUTEROL SULFATE HFA 108 (90 BASE) MCG/ACT IN AERS
2.0000 | INHALATION_SPRAY | Freq: Four times a day (QID) | RESPIRATORY_TRACT | Status: DC
  Administered 2019-04-13: 2 via RESPIRATORY_TRACT
  Filled 2019-04-13: qty 6.7

## 2019-04-13 MED ORDER — PREDNISONE 20 MG PO TABS: 40.0000 mg | ORAL_TABLET | Freq: Every day | ORAL | 0 refills | Status: DC

## 2019-04-13 NOTE — ED Triage Notes (Signed)
Pt BIB [facility] from home w/ c/o SOB past couple days, dx w/ pnuemonia 2/18, on doxycycline. Hx asthma, COPD, ran out of albuterol 04/08/19. Pt endorses productive cough, incr WOB, 88% on RA, 99% on 3 L [place]. Temp 100.0 per EMS, 1g Tylenol given.  RR35, BP 177/110, HR 101. Pt A&O, accessory muscle use to breathe.
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
## 2019-04-13 NOTE — Discharge Instructions (Signed)
As discussed, your evaluation today has been largely reassuring.  But, it is important that you monitor your condition carefully, and do not hesitate to return to the ED if you develop new, or concerning changes in your condition. ? ?Otherwise, please follow-up with your physician for appropriate ongoing care. ? ?

## 2019-04-14 NOTE — ED Notes (Signed)
Discharge instructions reviewed with pt. Pt verbalized understanding.   

## 2019-04-15 ENCOUNTER — Other Ambulatory Visit: Payer: Self-pay

## 2019-04-15 ENCOUNTER — Emergency Department (HOSPITAL_COMMUNITY): Payer: Medicare Other

## 2019-04-15 ENCOUNTER — Emergency Department (HOSPITAL_COMMUNITY)
Admission: EM | Admit: 2019-04-15 | Discharge: 2019-04-15 | Disposition: A | Discharge status: Home / Self Care | Payer: Medicare Other | Attending: Emergency Medicine | Admitting: Emergency Medicine

## 2019-04-15 DIAGNOSIS — I10 Essential (primary) hypertension: Secondary | ICD-10-CM | POA: Diagnosis not present

## 2019-04-15 DIAGNOSIS — F17210 Nicotine dependence, cigarettes, uncomplicated: Secondary | ICD-10-CM | POA: Insufficient documentation

## 2019-04-15 DIAGNOSIS — J441 Chronic obstructive pulmonary disease with (acute) exacerbation: Secondary | ICD-10-CM | POA: Diagnosis not present

## 2019-04-15 DIAGNOSIS — Z79899 Other long term (current) drug therapy: Secondary | ICD-10-CM | POA: Insufficient documentation

## 2019-04-15 DIAGNOSIS — Z96652 Presence of left artificial knee joint: Secondary | ICD-10-CM | POA: Insufficient documentation

## 2019-04-15 DIAGNOSIS — R0602 Shortness of breath: Secondary | ICD-10-CM | POA: Diagnosis present

## 2019-04-15 DIAGNOSIS — J45909 Unspecified asthma, uncomplicated: Secondary | ICD-10-CM | POA: Diagnosis not present

## 2019-04-15 DIAGNOSIS — Z7982 Long term (current) use of aspirin: Secondary | ICD-10-CM | POA: Insufficient documentation

## 2019-04-15 LAB — POCT I-STAT EG7
Acid-Base Excess: 5 mmol/L — ABNORMAL HIGH (ref 0.0–2.0)
Acid-Base Excess: 7 mmol/L — ABNORMAL HIGH (ref 0.0–2.0)
Bicarbonate: 35.4 mmol/L — ABNORMAL HIGH (ref 20.0–28.0)
Bicarbonate: 37.3 mmol/L — ABNORMAL HIGH (ref 20.0–28.0)
Calcium, Ion: 1.28 mmol/L (ref 1.15–1.40)
Calcium, Ion: 1.28 mmol/L (ref 1.15–1.40)
HCT: 36 % — ABNORMAL LOW (ref 39.0–52.0)
HCT: 35 % — ABNORMAL LOW (ref 39.0–52.0)
Hemoglobin: 12.2 g/dL — ABNORMAL LOW (ref 13.0–17.0)
Hemoglobin: 11.9 g/dL — ABNORMAL LOW (ref 13.0–17.0)
O2 Saturation: 59 %
O2 Saturation: 99 %
Potassium: 4.5 mmol/L (ref 3.5–5.1)
Potassium: 4.8 mmol/L (ref 3.5–5.1)
Sodium: 135 mmol/L (ref 135–145)
Sodium: 135 mmol/L (ref 135–145)
TCO2: 38 mmol/L — ABNORMAL HIGH (ref 22–32)
TCO2: 40 mmol/L — ABNORMAL HIGH (ref 22–32)
pCO2, Ven: 86.5 mmHg (ref 44.0–60.0)
pCO2, Ven: 86 mmHg (ref 44.0–60.0)
pH, Ven: 7.22 — ABNORMAL LOW (ref 7.250–7.430)
pH, Ven: 7.245 — ABNORMAL LOW (ref 7.250–7.430)
pO2, Ven: 39 mmHg (ref 32.0–45.0)
pO2, Ven: 142 mmHg — ABNORMAL HIGH (ref 32.0–45.0)

## 2019-04-15 LAB — CBC
HCT: 36.4 % — ABNORMAL LOW (ref 39.0–52.0)
Hemoglobin: 10.2 g/dL — ABNORMAL LOW (ref 13.0–17.0)
MCH: 24.2 pg — ABNORMAL LOW (ref 26.0–34.0)
MCHC: 28 g/dL — ABNORMAL LOW (ref 30.0–36.0)
MCV: 86.3 fL (ref 80.0–100.0)
Platelets: 164 10*3/uL (ref 150–400)
RBC: 4.22 MIL/uL (ref 4.22–5.81)
RDW: 21.9 % — ABNORMAL HIGH (ref 11.5–15.5)
WBC: 9.9 10*3/uL (ref 4.0–10.5)
nRBC: 0 % (ref 0.0–0.2)

## 2019-04-15 LAB — BASIC METABOLIC PANEL
Anion gap: 9 (ref 5–15)
BUN: 13 mg/dL (ref 8–23)
CO2: 32 mmol/L (ref 22–32)
Calcium: 9.5 mg/dL (ref 8.9–10.3)
Chloride: 95 mmol/L — ABNORMAL LOW (ref 98–111)
Creatinine, Ser: 0.67 mg/dL (ref 0.61–1.24)
GFR calc Af Amer: >60 mL/min (ref >60)
GFR calc non Af Amer: >60 mL/min (ref >60)
Glucose, Bld: 101 mg/dL — ABNORMAL HIGH (ref 70–99)
Potassium: 4.3 mmol/L (ref 3.5–5.1)
Sodium: 136 mmol/L (ref 135–145)

## 2019-04-15 LAB — BRAIN NATRIURETIC PEPTIDE: B Natriuretic Peptide: 27 pg/mL (ref 0.0–100.0)

## 2019-04-15 MED ORDER — PREDNISONE 10 MG PO TABS: 50.0000 mg | ORAL_TABLET | Freq: Every day | ORAL | 0 refills | Status: AC

## 2019-04-15 MED ORDER — IPRATROPIUM-ALBUTEROL 0.5-2.5 (3) MG/3ML IN SOLN
3.0000 mL | Freq: Three times a day (TID) | RESPIRATORY_TRACT | Status: DC | PRN
  Administered 2019-04-15 (×3): 3 mL via RESPIRATORY_TRACT
  Filled 2019-04-15: qty 3
  Filled 2019-04-15: qty 6

## 2019-04-15 NOTE — ED Notes (Signed)
Per Dr. Renaye Rakers give 3 duoneb treatments back to back.

## 2019-04-15 NOTE — ED Triage Notes (Signed)
Pt BIB [facility] from home. Pt reporting shortness of breath that has been ongoing for several months but worsened today. Pt reports he is on antibiotics at home for pna and has 3 pills left. EMS reports patient was 73% on room air upon their arrival with RR of 40. EMS gave 10mg  of albuterol, 1mg  of atrovent, 2gm of magnesium, 125 of solu-medrol. Pt wears 5L of oxygen via  at baseline. Pt 99% on 5L upon ED arrival. Diminished air movement upon auscultation
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
## 2019-04-15 NOTE — Discharge Instructions (Addendum)
I printed a prescripton for prednisone, a steroid.  You should take 50 mg by mouth once daily for the next 5 days.  You can fill this at ANY pharmacy near you.

## 2019-04-15 NOTE — ED Notes (Signed)
Pt resting comfortably, head of bed adjusted for comfort

## 2019-04-15 NOTE — ED Notes (Signed)
[facility] called to transport pt 

## 2019-04-25 ENCOUNTER — Other Ambulatory Visit: Payer: Self-pay

## 2019-04-25 ENCOUNTER — Emergency Department (HOSPITAL_COMMUNITY)
Admission: EM | Admit: 2019-04-25 | Discharge: 2019-04-25 | Disposition: A | Discharge status: Home / Self Care | Payer: Medicare Other | Attending: Emergency Medicine | Admitting: Emergency Medicine

## 2019-04-25 ENCOUNTER — Emergency Department (HOSPITAL_COMMUNITY): Payer: Medicare Other

## 2019-04-25 ENCOUNTER — Encounter (HOSPITAL_COMMUNITY): Payer: Self-pay

## 2019-04-25 DIAGNOSIS — F17210 Nicotine dependence, cigarettes, uncomplicated: Secondary | ICD-10-CM | POA: Insufficient documentation

## 2019-04-25 DIAGNOSIS — I10 Essential (primary) hypertension: Secondary | ICD-10-CM | POA: Diagnosis not present

## 2019-04-25 DIAGNOSIS — R0602 Shortness of breath: Secondary | ICD-10-CM | POA: Diagnosis present

## 2019-04-25 DIAGNOSIS — Z79899 Other long term (current) drug therapy: Secondary | ICD-10-CM | POA: Diagnosis not present

## 2019-04-25 DIAGNOSIS — J441 Chronic obstructive pulmonary disease with (acute) exacerbation: Secondary | ICD-10-CM

## 2019-04-25 DIAGNOSIS — Z7982 Long term (current) use of aspirin: Secondary | ICD-10-CM | POA: Insufficient documentation

## 2019-04-25 LAB — CBC
HCT: 36 % — ABNORMAL LOW (ref 39.0–52.0)
Hemoglobin: 10.1 g/dL — ABNORMAL LOW (ref 13.0–17.0)
MCH: 24.6 pg — ABNORMAL LOW (ref 26.0–34.0)
MCHC: 28.1 g/dL — ABNORMAL LOW (ref 30.0–36.0)
MCV: 87.8 fL (ref 80.0–100.0)
Platelets: 293 10*3/uL (ref 150–400)
RBC: 4.1 MIL/uL — ABNORMAL LOW (ref 4.22–5.81)
RDW: 22.1 % — ABNORMAL HIGH (ref 11.5–15.5)
WBC: 8.3 10*3/uL (ref 4.0–10.5)
nRBC: 0 % (ref 0.0–0.2)

## 2019-04-25 LAB — BASIC METABOLIC PANEL
Anion gap: 13 (ref 5–15)
BUN: 13 mg/dL (ref 8–23)
CO2: 30 mmol/L (ref 22–32)
Calcium: 9.9 mg/dL (ref 8.9–10.3)
Chloride: 97 mmol/L — ABNORMAL LOW (ref 98–111)
Creatinine, Ser: 0.81 mg/dL (ref 0.61–1.24)
GFR calc Af Amer: >60 mL/min (ref >60)
GFR calc non Af Amer: >60 mL/min (ref >60)
Glucose, Bld: 90 mg/dL (ref 70–99)
Potassium: 4.4 mmol/L (ref 3.5–5.1)
Sodium: 140 mmol/L (ref 135–145)

## 2019-04-25 MED ORDER — ALBUTEROL SULFATE HFA 108 (90 BASE) MCG/ACT IN AERS
8.0000 | INHALATION_SPRAY | Freq: Once | RESPIRATORY_TRACT | Status: AC
  Administered 2019-04-25: 21:00:00 8 via RESPIRATORY_TRACT
  Filled 2019-04-25: qty 6.7

## 2019-04-25 MED ORDER — PREDNISONE 10 MG (21) PO TBPK: ORAL_TABLET | Freq: Every day | ORAL | 0 refills | Status: DC

## 2019-04-25 MED ORDER — ALBUTEROL (5 MG/ML) CONTINUOUS INHALATION SOLN
10.0000 mg/h | INHALATION_SOLUTION | Freq: Once | RESPIRATORY_TRACT | Status: DC
  Filled 2019-04-25: qty 20

## 2019-04-25 MED ORDER — BENZONATATE 100 MG PO CAPS
100.0000 mg | ORAL_CAPSULE | Freq: Once | ORAL | Status: AC
  Administered 2019-04-25: 100 mg via ORAL
  Filled 2019-04-25: qty 1

## 2019-04-25 MED ORDER — IPRATROPIUM BROMIDE 0.02 % IN SOLN
0.5000 mg | Freq: Once | RESPIRATORY_TRACT | Status: DC
  Filled 2019-04-25: qty 2.5

## 2019-04-25 NOTE — ED Triage Notes (Signed)
Pt from home with ems c.o sob, diminished lung sounds noted upon EMS arrival with saturations in the 80s while on 3L [place]. Pt given 5mg  albuterol nebulizer and 125 solumedrol en route. Breathing had improved en route but upon arrival to ED pt states he is feeling worse again. Pt88% on room air, 2L [place] applied in triage which increased his oxygen saturation to 97%. Pt a.o resp slightly labored in triage.
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
## 2019-04-25 NOTE — ED Notes (Signed)
[facility] called for transport.  

## 2019-04-25 NOTE — Discharge Instructions (Signed)
Recommend taking the steroids as prescribed for suspected flareup of your COPD.  If you feel your breathing worsens, you develop chest pain, fever, recommend return to ER for reassessment.  Recommend close follow-up with your primary doctor, ideally to be seen on Monday or Tuesday for close recheck.

## 2019-05-05 ENCOUNTER — Encounter (HOSPITAL_COMMUNITY): Payer: Self-pay | Admitting: Emergency Medicine

## 2019-05-05 ENCOUNTER — Emergency Department (HOSPITAL_COMMUNITY): Payer: Medicare Other

## 2019-05-05 ENCOUNTER — Emergency Department (HOSPITAL_COMMUNITY)
Admission: EM | Admit: 2019-05-05 | Discharge: 2019-05-05 | Disposition: A | Discharge status: Home / Self Care | Payer: Medicare Other | Attending: Emergency Medicine | Admitting: Emergency Medicine

## 2019-05-05 DIAGNOSIS — F17210 Nicotine dependence, cigarettes, uncomplicated: Secondary | ICD-10-CM | POA: Diagnosis not present

## 2019-05-05 DIAGNOSIS — I10 Essential (primary) hypertension: Secondary | ICD-10-CM | POA: Diagnosis not present

## 2019-05-05 DIAGNOSIS — Z79899 Other long term (current) drug therapy: Secondary | ICD-10-CM | POA: Insufficient documentation

## 2019-05-05 DIAGNOSIS — R0602 Shortness of breath: Secondary | ICD-10-CM | POA: Diagnosis present

## 2019-05-05 DIAGNOSIS — J441 Chronic obstructive pulmonary disease with (acute) exacerbation: Secondary | ICD-10-CM

## 2019-05-05 LAB — CBC WITH DIFFERENTIAL/PLATELET
Abs Immature Granulocytes: 0.06 10*3/uL (ref 0.00–0.07)
Basophils Absolute: 0.1 10*3/uL (ref 0.0–0.1)
Basophils Relative: 1 %
Eosinophils Absolute: 0.2 10*3/uL (ref 0.0–0.5)
Eosinophils Relative: 3 %
HCT: 36.9 % — ABNORMAL LOW (ref 39.0–52.0)
Hemoglobin: 10.6 g/dL — ABNORMAL LOW (ref 13.0–17.0)
Immature Granulocytes: 1 %
Lymphocytes Relative: 15 %
Lymphs Abs: 1.3 10*3/uL (ref 0.7–4.0)
MCH: 24.8 pg — ABNORMAL LOW (ref 26.0–34.0)
MCHC: 28.7 g/dL — ABNORMAL LOW (ref 30.0–36.0)
MCV: 86.4 fL (ref 80.0–100.0)
Monocytes Absolute: 0.7 10*3/uL (ref 0.1–1.0)
Monocytes Relative: 8 %
Neutro Abs: 6.4 10*3/uL (ref 1.7–7.7)
Neutrophils Relative %: 72 %
Platelets: 226 10*3/uL (ref 150–400)
RBC: 4.27 MIL/uL (ref 4.22–5.81)
RDW: 21.7 % — ABNORMAL HIGH (ref 11.5–15.5)
WBC: 8.8 10*3/uL (ref 4.0–10.5)
nRBC: 0 % (ref 0.0–0.2)

## 2019-05-05 LAB — BASIC METABOLIC PANEL
Anion gap: 12 (ref 5–15)
BUN: 13 mg/dL (ref 8–23)
CO2: 30 mmol/L (ref 22–32)
Calcium: 9.8 mg/dL (ref 8.9–10.3)
Chloride: 96 mmol/L — ABNORMAL LOW (ref 98–111)
Creatinine, Ser: 0.75 mg/dL (ref 0.61–1.24)
GFR calc Af Amer: >60 mL/min (ref >60)
GFR calc non Af Amer: >60 mL/min (ref >60)
Glucose, Bld: 93 mg/dL (ref 70–99)
Potassium: 4 mmol/L (ref 3.5–5.1)
Sodium: 138 mmol/L (ref 135–145)

## 2019-05-05 MED ORDER — PREDNISONE 10 MG PO TABS: 20.0000 mg | ORAL_TABLET | Freq: Every day | ORAL | 0 refills | Status: DC

## 2019-05-05 MED ORDER — PREDNISONE 20 MG PO TABS
60.0000 mg | ORAL_TABLET | Freq: Once | ORAL | Status: AC
  Administered 2019-05-05: 60 mg via ORAL
  Filled 2019-05-05: qty 3

## 2019-05-05 MED ORDER — ALBUTEROL SULFATE HFA 108 (90 BASE) MCG/ACT IN AERS
2.0000 | INHALATION_SPRAY | Freq: Once | RESPIRATORY_TRACT | Status: AC
  Administered 2019-05-05: 2 via RESPIRATORY_TRACT
  Filled 2019-05-05: qty 6.7

## 2019-05-05 MED ORDER — ALBUTEROL SULFATE HFA 108 (90 BASE) MCG/ACT IN AERS: 1.0000 | INHALATION_SPRAY | Freq: Four times a day (QID) | RESPIRATORY_TRACT | 1 refills | Status: DC | PRN
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
## 2019-05-05 NOTE — ED Notes (Signed)
[facility] Called to take patient home (on file)

## 2019-05-05 NOTE — Discharge Instructions (Signed)
Please return for any problem.  Follow-up with your regular care provider as instructed. °

## 2019-05-05 NOTE — ED Triage Notes (Signed)
Pt here from home for SOB that began yesterday morning. Pt has hx of asthma, used inhaler yesterday but is now empty and has not received his refill yet. Pt endorses productive cough, hx of pneumonia and has finished antibiotics for it. Per EMS lung sounds diminished in all fields, gave 5mg  albuterol w/ relief. Pt denies cp.

## 2019-06-01 ENCOUNTER — Emergency Department (HOSPITAL_COMMUNITY): Payer: Medicare Other

## 2019-06-01 ENCOUNTER — Emergency Department (HOSPITAL_COMMUNITY)
Admission: EM | Admit: 2019-06-01 | Discharge: 2019-06-01 | Disposition: A | Discharge status: Home / Self Care | Payer: Medicare Other | Attending: Emergency Medicine | Admitting: Emergency Medicine

## 2019-06-01 ENCOUNTER — Other Ambulatory Visit: Payer: Self-pay

## 2019-06-01 ENCOUNTER — Encounter (HOSPITAL_COMMUNITY): Payer: Self-pay | Admitting: Emergency Medicine

## 2019-06-01 DIAGNOSIS — R0602 Shortness of breath: Secondary | ICD-10-CM

## 2019-06-01 DIAGNOSIS — Z79899 Other long term (current) drug therapy: Secondary | ICD-10-CM | POA: Diagnosis not present

## 2019-06-01 DIAGNOSIS — Z7982 Long term (current) use of aspirin: Secondary | ICD-10-CM | POA: Diagnosis not present

## 2019-06-01 DIAGNOSIS — Z20822 Contact with and (suspected) exposure to covid-19: Secondary | ICD-10-CM | POA: Diagnosis not present

## 2019-06-01 DIAGNOSIS — J441 Chronic obstructive pulmonary disease with (acute) exacerbation: Secondary | ICD-10-CM | POA: Diagnosis not present

## 2019-06-01 DIAGNOSIS — F17210 Nicotine dependence, cigarettes, uncomplicated: Secondary | ICD-10-CM | POA: Insufficient documentation

## 2019-06-01 DIAGNOSIS — I10 Essential (primary) hypertension: Secondary | ICD-10-CM | POA: Insufficient documentation

## 2019-06-01 LAB — CBC WITH DIFFERENTIAL/PLATELET
Abs Immature Granulocytes: 0.05 10*3/uL (ref 0.00–0.07)
Basophils Absolute: 0.1 10*3/uL (ref 0.0–0.1)
Basophils Relative: 1 %
Eosinophils Absolute: 0.1 10*3/uL (ref 0.0–0.5)
Eosinophils Relative: 2 %
HCT: 36 % — ABNORMAL LOW (ref 39.0–52.0)
Hemoglobin: 10.4 g/dL — ABNORMAL LOW (ref 13.0–17.0)
Immature Granulocytes: 1 %
Lymphocytes Relative: 11 %
Lymphs Abs: 0.9 10*3/uL (ref 0.7–4.0)
MCH: 25.6 pg — ABNORMAL LOW (ref 26.0–34.0)
MCHC: 28.9 g/dL — ABNORMAL LOW (ref 30.0–36.0)
MCV: 88.5 fL (ref 80.0–100.0)
Monocytes Absolute: 0.8 10*3/uL (ref 0.1–1.0)
Monocytes Relative: 10 %
Neutro Abs: 5.8 10*3/uL (ref 1.7–7.7)
Neutrophils Relative %: 75 %
Platelets: 252 10*3/uL (ref 150–400)
RBC: 4.07 MIL/uL — ABNORMAL LOW (ref 4.22–5.81)
RDW: 21.3 % — ABNORMAL HIGH (ref 11.5–15.5)
WBC: 7.7 10*3/uL (ref 4.0–10.5)
nRBC: 0 % (ref 0.0–0.2)

## 2019-06-01 LAB — POCT I-STAT 7, (LYTES, BLD GAS, ICA,H+H)
Acid-Base Excess: 3 mmol/L — ABNORMAL HIGH (ref 0.0–2.0)
Bicarbonate: 29.8 mmol/L — ABNORMAL HIGH (ref 20.0–28.0)
Calcium, Ion: 1.3 mmol/L (ref 1.15–1.40)
HCT: 34 % — ABNORMAL LOW (ref 39.0–52.0)
Hemoglobin: 11.6 g/dL — ABNORMAL LOW (ref 13.0–17.0)
O2 Saturation: 92 %
Patient temperature: 97.6
Potassium: 4 mmol/L (ref 3.5–5.1)
Sodium: 134 mmol/L — ABNORMAL LOW (ref 135–145)
TCO2: 31 mmol/L (ref 22–32)
pCO2 arterial: 51.3 mmHg — ABNORMAL HIGH (ref 32.0–48.0)
pH, Arterial: 7.369 (ref 7.350–7.450)
pO2, Arterial: 65 mmHg — ABNORMAL LOW (ref 83.0–108.0)

## 2019-06-01 LAB — BASIC METABOLIC PANEL
Anion gap: 10 (ref 5–15)
BUN: 7 mg/dL — ABNORMAL LOW (ref 8–23)
CO2: 29 mmol/L (ref 22–32)
Calcium: 9.3 mg/dL (ref 8.9–10.3)
Chloride: 99 mmol/L (ref 98–111)
Creatinine, Ser: 0.75 mg/dL (ref 0.61–1.24)
GFR calc Af Amer: >60 mL/min (ref >60)
GFR calc non Af Amer: >60 mL/min (ref >60)
Glucose, Bld: 121 mg/dL — ABNORMAL HIGH (ref 70–99)
Potassium: 4.8 mmol/L (ref 3.5–5.1)
Sodium: 138 mmol/L (ref 135–145)

## 2019-06-01 LAB — POC SARS CORONAVIRUS 2 AG -  ED: SARS Coronavirus 2 Ag: NEGATIVE

## 2019-06-01 MED ORDER — IPRATROPIUM BROMIDE 0.02 % IN SOLN: 0.5000 mg | Freq: Once | RESPIRATORY_TRACT | Status: DC

## 2019-06-01 MED ORDER — IPRATROPIUM BROMIDE HFA 17 MCG/ACT IN AERS
2.0000 | INHALATION_SPRAY | Freq: Once | RESPIRATORY_TRACT | Status: AC
  Administered 2019-06-01: 2 via RESPIRATORY_TRACT
  Filled 2019-06-01: qty 12.9

## 2019-06-01 MED ORDER — MAGNESIUM SULFATE 2 GM/50ML IV SOLN
2.0000 g | Freq: Once | INTRAVENOUS | Status: AC
  Administered 2019-06-01: 2 g via INTRAVENOUS
  Filled 2019-06-01: qty 50

## 2019-06-01 MED ORDER — ALBUTEROL SULFATE HFA 108 (90 BASE) MCG/ACT IN AERS
10.0000 | INHALATION_SPRAY | Freq: Once | RESPIRATORY_TRACT | Status: AC
  Administered 2019-06-01: 10 via RESPIRATORY_TRACT
  Filled 2019-06-01: qty 6.7

## 2019-06-01 MED ORDER — ALBUTEROL (5 MG/ML) CONTINUOUS INHALATION SOLN: 10.0000 mg/h | INHALATION_SOLUTION | RESPIRATORY_TRACT | Status: DC

## 2019-06-01 MED ORDER — AEROCHAMBER PLUS FLO-VU LARGE MISC
1.0000 | Freq: Once | Status: AC
  Administered 2019-06-01: 1

## 2019-06-01 MED ORDER — PREDNISONE 20 MG PO TABS: ORAL_TABLET | ORAL | 0 refills | Status: DC

## 2019-06-01 MED ORDER — ALBUTEROL SULFATE HFA 108 (90 BASE) MCG/ACT IN AERS
6.0000 | INHALATION_SPRAY | Freq: Once | RESPIRATORY_TRACT | Status: AC
  Administered 2019-06-01: 6 via RESPIRATORY_TRACT
  Filled 2019-06-01: qty 6.7

## 2019-06-01 MED ORDER — METHYLPREDNISOLONE SODIUM SUCC 125 MG IJ SOLR
125.0000 mg | Freq: Once | INTRAMUSCULAR | Status: AC
  Administered 2019-06-01: 125 mg via INTRAVENOUS
  Filled 2019-06-01: qty 2

## 2019-06-01 MED ORDER — AEROCHAMBER PLUS FLO-VU LARGE MISC
Status: AC
  Filled 2019-06-01: qty 1

## 2019-06-01 NOTE — ED Provider Notes (Signed)
Care transferred to me.  After further albuterol, patient is feeling better.  Feels like a recurrent COPD exacerbation.  Sats are 100% on 2 L.  He states that he himself manages his oxygen requirements though he does not have a pulse oximeter.  I discussed he should get 1 and also that there are deleterious effects to hyperoxia for COPD.  Recommended he go off oxygen at this point.  He seems understand this.  Will discharge with prednisone.   Pricilla Loveless, MD 06/01/19 1021
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
## 2019-06-01 NOTE — Discharge Instructions (Addendum)
Use your albuterol inhaler 2 puffs every 4 hours for the next 48 hours.  If you need it significantly more than this and then return to the ER for evaluation.  Follow-up with your lung specialist for evaluation of how much oxygen you need.
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
## 2019-06-26 ENCOUNTER — Ambulatory Visit: Payer: Medicare Other | Admitting: Podiatry

## 2019-07-13 ENCOUNTER — Observation Stay (HOSPITAL_COMMUNITY)
Admission: EM | Admit: 2019-07-13 | Discharge: 2019-07-14 | Disposition: A | Discharge status: Home / Self Care | Payer: Medicare Other | Attending: Internal Medicine | Admitting: Internal Medicine

## 2019-07-13 ENCOUNTER — Encounter (HOSPITAL_COMMUNITY): Payer: Self-pay

## 2019-07-13 ENCOUNTER — Emergency Department (HOSPITAL_COMMUNITY): Payer: Medicare Other

## 2019-07-13 DIAGNOSIS — J45901 Unspecified asthma with (acute) exacerbation: Secondary | ICD-10-CM | POA: Diagnosis present

## 2019-07-13 DIAGNOSIS — M1990 Unspecified osteoarthritis, unspecified site: Secondary | ICD-10-CM | POA: Insufficient documentation

## 2019-07-13 DIAGNOSIS — K219 Gastro-esophageal reflux disease without esophagitis: Secondary | ICD-10-CM | POA: Insufficient documentation

## 2019-07-13 DIAGNOSIS — J440 Chronic obstructive pulmonary disease with acute lower respiratory infection: Secondary | ICD-10-CM | POA: Diagnosis not present

## 2019-07-13 DIAGNOSIS — Z7982 Long term (current) use of aspirin: Secondary | ICD-10-CM | POA: Insufficient documentation

## 2019-07-13 DIAGNOSIS — J208 Acute bronchitis due to other specified organisms: Secondary | ICD-10-CM | POA: Diagnosis not present

## 2019-07-13 DIAGNOSIS — J441 Chronic obstructive pulmonary disease with (acute) exacerbation: Principal | ICD-10-CM | POA: Diagnosis present

## 2019-07-13 DIAGNOSIS — R0902 Hypoxemia: Secondary | ICD-10-CM | POA: Insufficient documentation

## 2019-07-13 DIAGNOSIS — F17210 Nicotine dependence, cigarettes, uncomplicated: Secondary | ICD-10-CM | POA: Diagnosis present

## 2019-07-13 DIAGNOSIS — Z9981 Dependence on supplemental oxygen: Secondary | ICD-10-CM | POA: Insufficient documentation

## 2019-07-13 DIAGNOSIS — I10 Essential (primary) hypertension: Secondary | ICD-10-CM | POA: Diagnosis not present

## 2019-07-13 DIAGNOSIS — Z96652 Presence of left artificial knee joint: Secondary | ICD-10-CM | POA: Diagnosis not present

## 2019-07-13 DIAGNOSIS — Z79899 Other long term (current) drug therapy: Secondary | ICD-10-CM | POA: Insufficient documentation

## 2019-07-13 DIAGNOSIS — B9689 Other specified bacterial agents as the cause of diseases classified elsewhere: Secondary | ICD-10-CM | POA: Diagnosis present

## 2019-07-13 DIAGNOSIS — Z20822 Contact with and (suspected) exposure to covid-19: Secondary | ICD-10-CM | POA: Insufficient documentation

## 2019-07-13 LAB — POC SARS CORONAVIRUS 2 AG -  ED: SARS Coronavirus 2 Ag: NEGATIVE

## 2019-07-13 LAB — CBC
HCT: 38.5 % — ABNORMAL LOW (ref 39.0–52.0)
Hemoglobin: 11.5 g/dL — ABNORMAL LOW (ref 13.0–17.0)
MCH: 25.4 pg — ABNORMAL LOW (ref 26.0–34.0)
MCHC: 29.9 g/dL — ABNORMAL LOW (ref 30.0–36.0)
MCV: 85 fL (ref 80.0–100.0)
Platelets: 227 10*3/uL (ref 150–400)
RBC: 4.53 MIL/uL (ref 4.22–5.81)
RDW: 17.7 % — ABNORMAL HIGH (ref 11.5–15.5)
WBC: 9.1 10*3/uL (ref 4.0–10.5)
nRBC: 0 % (ref 0.0–0.2)

## 2019-07-13 LAB — BRAIN NATRIURETIC PEPTIDE: B Natriuretic Peptide: 124.4 pg/mL — ABNORMAL HIGH (ref 0.0–100.0)

## 2019-07-13 MED ORDER — IPRATROPIUM BROMIDE 0.02 % IN SOLN
0.5000 mg | Freq: Once | RESPIRATORY_TRACT | Status: AC
  Administered 2019-07-13: 0.5 mg via RESPIRATORY_TRACT
  Filled 2019-07-13: qty 2.5

## 2019-07-13 MED ORDER — METHYLPREDNISOLONE SODIUM SUCC 125 MG IJ SOLR
125.0000 mg | Freq: Once | INTRAMUSCULAR | Status: AC
  Administered 2019-07-13: 125 mg via INTRAVENOUS
  Filled 2019-07-13: qty 2

## 2019-07-13 MED ORDER — SODIUM CHLORIDE 0.9 % IV SOLN: INTRAVENOUS | Status: AC

## 2019-07-13 MED ORDER — ALBUTEROL SULFATE (2.5 MG/3ML) 0.083% IN NEBU
5.0000 mg | INHALATION_SOLUTION | RESPIRATORY_TRACT | Status: DC | PRN
  Administered 2019-07-13 (×2): 5 mg via RESPIRATORY_TRACT
  Filled 2019-07-13 (×2): qty 6

## 2019-07-13 MED ORDER — MAGNESIUM SULFATE 2 GM/50ML IV SOLN
2.0000 g | Freq: Once | INTRAVENOUS | Status: AC
  Administered 2019-07-13: 2 g via INTRAVENOUS
  Filled 2019-07-13: qty 50

## 2019-07-13 MED ORDER — ALBUTEROL SULFATE HFA 108 (90 BASE) MCG/ACT IN AERS
4.0000 | INHALATION_SPRAY | Freq: Once | RESPIRATORY_TRACT | Status: DC
  Filled 2019-07-13: qty 6.7

## 2019-07-13 MED ORDER — ACETAMINOPHEN 325 MG PO TABS
650.0000 mg | ORAL_TABLET | Freq: Once | ORAL | Status: AC
  Administered 2019-07-13: 650 mg via ORAL
  Filled 2019-07-13: qty 2
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
## 2019-07-13 NOTE — ED Triage Notes (Signed)
Pt bib [facility] from home w/ c/o resp distress. Pt has hx of COPD, does not wear any home o2. Initially 65% on RA w/ EMS, pt placed on NRB and spo2 up to 98%. Pt received albuterol, atrovent, and duoneb w/ EMS. Pt AOx4, hypertensive at 210/100, NSR on EMS 12 lead, HR 130.

## 2019-07-14 ENCOUNTER — Other Ambulatory Visit: Payer: Self-pay

## 2019-07-14 ENCOUNTER — Encounter (HOSPITAL_COMMUNITY): Payer: Self-pay | Admitting: Internal Medicine

## 2019-07-14 DIAGNOSIS — F17210 Nicotine dependence, cigarettes, uncomplicated: Secondary | ICD-10-CM

## 2019-07-14 DIAGNOSIS — J441 Chronic obstructive pulmonary disease with (acute) exacerbation: Secondary | ICD-10-CM | POA: Diagnosis not present

## 2019-07-14 DIAGNOSIS — B9689 Other specified bacterial agents as the cause of diseases classified elsewhere: Secondary | ICD-10-CM | POA: Diagnosis present

## 2019-07-14 DIAGNOSIS — J45901 Unspecified asthma with (acute) exacerbation: Secondary | ICD-10-CM | POA: Diagnosis not present

## 2019-07-14 DIAGNOSIS — I10 Essential (primary) hypertension: Secondary | ICD-10-CM | POA: Diagnosis not present

## 2019-07-14 LAB — BASIC METABOLIC PANEL
Anion gap: 15 (ref 5–15)
BUN: 12 mg/dL (ref 8–23)
CO2: 25 mmol/L (ref 22–32)
Calcium: 9.8 mg/dL (ref 8.9–10.3)
Chloride: 95 mmol/L — ABNORMAL LOW (ref 98–111)
Creatinine, Ser: 0.83 mg/dL (ref 0.61–1.24)
GFR calc Af Amer: >60 mL/min (ref >60)
GFR calc non Af Amer: >60 mL/min (ref >60)
Glucose, Bld: 121 mg/dL — ABNORMAL HIGH (ref 70–99)
Potassium: 4.4 mmol/L (ref 3.5–5.1)
Sodium: 135 mmol/L (ref 135–145)

## 2019-07-14 LAB — HEMOGLOBIN A1C
Hgb A1c MFr Bld: 6.3 % — ABNORMAL HIGH (ref 4.8–5.6)
Mean Plasma Glucose: 134.11 mg/dL

## 2019-07-14 LAB — POCT I-STAT 7, (LYTES, BLD GAS, ICA,H+H)
Acid-Base Excess: 3 mmol/L — ABNORMAL HIGH (ref 0.0–2.0)
Bicarbonate: 30.2 mmol/L — ABNORMAL HIGH (ref 20.0–28.0)
Calcium, Ion: 1.31 mmol/L (ref 1.15–1.40)
HCT: 33 % — ABNORMAL LOW (ref 39.0–52.0)
Hemoglobin: 11.2 g/dL — ABNORMAL LOW (ref 13.0–17.0)
O2 Saturation: 89 %
Patient temperature: 98.4
Potassium: 4.1 mmol/L (ref 3.5–5.1)
Sodium: 134 mmol/L — ABNORMAL LOW (ref 135–145)
TCO2: 32 mmol/L (ref 22–32)
pCO2 arterial: 61.3 mmHg — ABNORMAL HIGH (ref 32.0–48.0)
pH, Arterial: 7.3 — ABNORMAL LOW (ref 7.350–7.450)
pO2, Arterial: 63 mmHg — ABNORMAL LOW (ref 83.0–108.0)

## 2019-07-14 LAB — D-DIMER, QUANTITATIVE: D-Dimer, Quant: 0.89 ug/mL-FEU — ABNORMAL HIGH (ref 0.00–0.50)

## 2019-07-14 LAB — SARS CORONAVIRUS 2 BY RT PCR (HOSPITAL ORDER, PERFORMED IN ~~LOC~~ HOSPITAL LAB): SARS Coronavirus 2: NEGATIVE

## 2019-07-14 LAB — TROPONIN I (HIGH SENSITIVITY): Troponin I (High Sensitivity): 6 ng/L (ref <18)

## 2019-07-14 LAB — HIV ANTIBODY (ROUTINE TESTING W REFLEX): HIV Screen 4th Generation wRfx: NONREACTIVE

## 2019-07-14 MED ORDER — LISINOPRIL 20 MG PO TABS
20.0000 mg | ORAL_TABLET | Freq: Every day | ORAL | Status: DC
  Administered 2019-07-14: 20 mg via ORAL
  Filled 2019-07-14: qty 1

## 2019-07-14 MED ORDER — ENOXAPARIN SODIUM 40 MG/0.4ML ~~LOC~~ SOLN
40.0000 mg | SUBCUTANEOUS | Status: DC
  Administered 2019-07-14: 40 mg via SUBCUTANEOUS
  Filled 2019-07-14: qty 0.4

## 2019-07-14 MED ORDER — ALBUTEROL SULFATE HFA 108 (90 BASE) MCG/ACT IN AERS
2.0000 | INHALATION_SPRAY | Freq: Four times a day (QID) | RESPIRATORY_TRACT | 1 refills | Status: DC | PRN
Start: 2019-07-14 — End: 2020-02-02

## 2019-07-14 MED ORDER — DOXYCYCLINE MONOHYDRATE 100 MG PO CAPS: 100.0000 mg | ORAL_CAPSULE | Freq: Two times a day (BID) | ORAL | 0 refills | Status: DC

## 2019-07-14 MED ORDER — LISINOPRIL 20 MG PO TABS: 20.0000 mg | ORAL_TABLET | Freq: Every day | ORAL | 1 refills | Status: DC

## 2019-07-14 MED ORDER — SODIUM CHLORIDE 0.9 % IV SOLN
100.0000 mg | Freq: Two times a day (BID) | INTRAVENOUS | Status: DC
  Administered 2019-07-14: 100 mg via INTRAVENOUS
  Filled 2019-07-14 (×2): qty 100

## 2019-07-14 MED ORDER — METHYLPREDNISOLONE SODIUM SUCC 40 MG IJ SOLR: 40.0000 mg | Freq: Two times a day (BID) | INTRAMUSCULAR | Status: DC

## 2019-07-14 MED ORDER — NICOTINE 7 MG/24HR TD PT24: 7.0000 mg | MEDICATED_PATCH | Freq: Every day | TRANSDERMAL | 0 refills | Status: DC

## 2019-07-14 MED ORDER — NICOTINE 7 MG/24HR TD PT24
7.0000 mg | MEDICATED_PATCH | Freq: Every day | TRANSDERMAL | Status: DC
  Administered 2019-07-14: 7 mg via TRANSDERMAL
  Filled 2019-07-14: qty 1

## 2019-07-14 MED ORDER — ASPIRIN 325 MG PO TABS
325.0000 mg | ORAL_TABLET | Freq: Every day | ORAL | Status: DC
  Administered 2019-07-14: 325 mg via ORAL
  Filled 2019-07-14: qty 1

## 2019-07-14 MED ORDER — PREDNISONE 50 MG PO TABS
50.0000 mg | ORAL_TABLET | Freq: Every day | ORAL | 0 refills | Status: DC
Start: 2019-07-14 — End: 2019-07-22

## 2019-07-14 MED ORDER — FLUTICASONE FUROATE-VILANTEROL 100-25 MCG/INH IN AEPB
1.0000 | INHALATION_SPRAY | Freq: Every day | RESPIRATORY_TRACT | Status: DC
  Administered 2019-07-14: 1 via RESPIRATORY_TRACT
  Filled 2019-07-14: qty 28

## 2019-07-14 MED ORDER — IPRATROPIUM-ALBUTEROL 0.5-2.5 (3) MG/3ML IN SOLN: 3.0000 mL | RESPIRATORY_TRACT | Status: DC | PRN

## 2019-07-14 MED ORDER — BREO ELLIPTA 200-25 MCG/INH IN AEPB: 1.0000 | INHALATION_SPRAY | Freq: Every day | RESPIRATORY_TRACT | 1 refills | Status: DC

## 2019-07-14 MED ORDER — IPRATROPIUM-ALBUTEROL 0.5-2.5 (3) MG/3ML IN SOLN
3.0000 mL | Freq: Four times a day (QID) | RESPIRATORY_TRACT | Status: DC
  Administered 2019-07-14: 3 mL via RESPIRATORY_TRACT
  Filled 2019-07-14: qty 3
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
## 2019-07-14 NOTE — Progress Notes (Signed)
Discharge information reviewed with patient. All questions answered at this time. RXs given. Cab voucher obtained from CSW, [place]. RN and patient appreciatef this transportation voucher. No other questions or concerns voice.   Sim Boast, RN
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
## 2019-07-14 NOTE — Progress Notes (Signed)
SATURATION QUALIFICATIONS: (This note is used to comply with regulatory documentation for home oxygen)  Patient Saturations on Room Air at Rest = 86-89%  Patient Saturations on Room Air while Ambulating = 89%  Patient Saturations on  2L Liters of oxygen while Ambulating = 89-92%  Please briefly explain why patient needs home oxygen: Patient noted with dyspnea and SOB on exertion. MD notified of data above.

## 2019-07-14 NOTE — ED Notes (Signed)
Tele  Breakfast Ordered 

## 2019-07-14 NOTE — Progress Notes (Signed)
Patient arrived from the ED via stretcher, a/ox4, VSS. TELE applied and confirmed. Safety precaution and orders reviewed with pt. 2L [place] applied, saturation in the mid 90s. Will continue to monitor,

## 2019-07-14 NOTE — ED Notes (Signed)
Attempted IV start x2 w/o success after pt removed previous IV

## 2019-07-14 NOTE — Progress Notes (Signed)
Patient with discharge order but unable to obtain transportation until later today. MD notified. RN will consult [place]. RXs printed per MD. Patient verbalized he will let RN know regarding his transportation.
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
## 2019-07-22 ENCOUNTER — Ambulatory Visit (INDEPENDENT_AMBULATORY_CARE_PROVIDER_SITE_OTHER): Payer: Medicare Other | Admitting: Internal Medicine

## 2019-07-22 ENCOUNTER — Encounter: Payer: Self-pay | Admitting: Internal Medicine

## 2019-07-22 ENCOUNTER — Telehealth: Payer: Self-pay

## 2019-07-22 ENCOUNTER — Other Ambulatory Visit: Payer: Self-pay

## 2019-07-22 VITALS — BP 144/74 | HR 77 | Temp 98.4°F

## 2019-07-22 DIAGNOSIS — T8131XS Disruption of external operation (surgical) wound, not elsewhere classified, sequela: Secondary | ICD-10-CM | POA: Diagnosis not present

## 2019-07-22 DIAGNOSIS — Y831 Surgical operation with implant of artificial internal device as the cause of abnormal reaction of the patient, or of later complication, without mention of misadventure at the time of the procedure: Secondary | ICD-10-CM | POA: Diagnosis not present

## 2019-07-22 DIAGNOSIS — Z0000 Encounter for general adult medical examination without abnormal findings: Secondary | ICD-10-CM

## 2019-07-22 NOTE — Patient Instructions (Signed)
Call us back if your wound starts to spontaneously drain

## 2019-07-22 NOTE — Telephone Encounter (Signed)
Made patient aware of updated imaging orders. He will work on setting up transportation in order to complete his xrays at [facility].  Valarie Cones

## 2019-07-22 NOTE — Progress Notes (Signed)
Patient ID: [name], male   DOB: 1955-02-22, 64 y.o.   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 Just started to have drainage near prosthesis, not along old incision at the end of may   Had recent COPD exarcebation.  Has not had covid vaccine, scarred of needles   Outpatient Encounter Medications as of 07/22/2019  Medication Sig  . acetaminophen (TYLENOL) 500 MG tablet Take 1,000 mg by mouth every 6 (six) hours as needed for headache (pain).   Marland Kitchen albuterol (VENTOLIN HFA) 108 (90 Base) MCG/ACT inhaler Inhale 2 puffs into the lungs every 6 (six) hours as needed for wheezing or shortness of breath.  . doxycycline (MONODOX) 100 MG capsule Take 1 capsule (100 mg total) by mouth 2 (two) times daily.  . fluticasone furoate-vilanterol (BREO ELLIPTA) 200-25 MCG/INH AEPB Inhale 1 puff into the lungs daily.  Marland Kitchen lisinopril (ZESTRIL) 20 MG tablet Take 1 tablet (20 mg total) by mouth daily with breakfast.  . predniSONE (DELTASONE) 50 MG tablet Take 1 tablet (50 mg total) by mouth daily.  . nicotine (NICODERM CQ - DOSED IN MG/24 HR) 7 mg/24hr patch Place 1 patch (7 mg total) onto the skin daily. (Patient not taking: Reported on 07/22/2019)   No facility-administered encounter medications on file as of 07/22/2019.     Patient Active Problem List   Diagnosis Date Noted  . Acute exacerbation of COPD with asthma (HCC) 07/14/2019  . Nicotine dependence, cigarettes, uncomplicated 07/14/2019  . Acute bacterial bronchitis 07/14/2019  . Knee osteoarthritis 10/12/2015  . Gram-negative infection   . Surgical wound dehiscence 07/30/2015  . Wound dehiscence, surgical 07/29/2015  . Tobacco abuse 07/29/2015  . 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 Wound dehiscence 07/19/2015  . Rupture of left patellar tendon, open, post-total knee  replacement 07/19/2015  . Patellar tendon rupture 07/19/2015  . Primary osteoarthritis of left knee 06/18/2015  . Chronic obstructive airway disease with asthma (HCC) 06/18/2015  . Essential hypertension 06/18/2015  . Special screening for malignant neoplasms, colon 12/11/2012     Health Maintenance Due  Topic Date Due  . COVID-19 Vaccine (1) Never done     Review of Systems Review of Systems  Constitutional: Negative for fever, chills, diaphoresis, activity change, appetite change, fatigue and unexpected weight change.  HENT: Negative for congestion, sore throat, rhinorrhea, sneezing, trouble swallowing and sinus pressure.  Eyes: Negative for photophobia and visual disturbance.  Respiratory: Negative for cough, chest tightness, shortness of breath, wheezing and stridor.  Cardiovascular: Negative for chest pain, palpitations and leg swelling.  Gastrointestinal: Negative for nausea, vomiting, abdominal pain, diarrhea, constipation, blood in stool, abdominal distention and anal bleeding.  Genitourinary: Negative for dysuria, hematuria, flank pain and difficulty urinating.  Musculoskeletal: Negative for myalgias, back pain, joint swelling, arthralgias and gait problem.  Skin: Negative for color change, pallor, rash and wound.  Neurological: Negative for dizziness, tremors, weakness and light-headedness.  Hematological: Negative for adenopathy. Does not bruise/bleed easily.  Psychiatric/Behavioral: Negative for behavioral problems, confusion, sleep disturbance, dysphoric mood, decreased concentration and agitation.    Physical Exam   BP (!) 144/74   Pulse 77   Temp 98.4 F (36.9 C) (Oral)   SpO2 94%   Physical Exam  Constitutional: He is oriented to person, place, and time. He appears well-developed and well-nourished. No distress.  HENT:  Mouth/Throat:  Oropharynx is clear and moist. No oropharyngeal exudate.  Cardiovascular: Normal rate, regular rhythm and normal heart sounds.  Exam reveals no gallop and no friction rub.  No murmur heard.  Pulmonary/Chest: Effort normal and breath sounds normal. No respiratory distress. He has no wheezes.  Abdominal: Soft. Bowel sounds are normal. He exhibits no distension. There is no tenderness.  Lymphadenopathy:  He has no cervical adenopathy.  Neurological: He is alert and oriented to person, place, and time.  Skin: Skin is warm and dry. No rash noted. No erythema. Left knee is not currently draining Psychiatric: He has a normal mood and affect. His behavior is normal.   Lab Results  Component Value Date   LABRPR Non Reactive 08/03/2015    CBC Lab Results  Component Value Date   WBC 9.1 07/13/2019   RBC 4.53 07/13/2019   HGB 11.2 (L) 07/14/2019   HCT 33.0 (L) 07/14/2019   PLT 227 07/13/2019   MCV 85.0 07/13/2019   MCH 25.4 (L) 07/13/2019   MCHC 29.9 (L) 07/13/2019   RDW 17.7 (H) 07/13/2019   LYMPHSABS 0.9 06/01/2019   MONOABS 0.8 06/01/2019   EOSABS 0.1 06/01/2019    BMET Lab Results  Component Value Date   NA 134 (L) 07/14/2019   K 4.1 07/14/2019   CL 95 (L) 07/13/2019   CO2 25 07/13/2019   GLUCOSE 121 (H) 07/13/2019   BUN 12 07/13/2019   CREATININE 0.83 07/13/2019   CALCIUM 9.8 07/13/2019   GFRNONAA >60 07/13/2019   GFRAA >60 07/13/2019    Lab Results  Component Value Date   ESRSEDRATE 9 07/22/2019   Lab Results  Component Value Date   CRP 1.0 07/22/2019     Assessment and Plan  Chronic pji = can do doxycycline 100mg  bid for now since it appears it has helped since dr Percell Miller has initiated abtx. Suspect it is not enterococcus or enterobacter -- since it would not have responded to doxy We need to check sed rate and crp (addendum - WNL) Wound looks like it is healing, not currently draining Follow up with dr Percell Miller If recurrence , maybe a candidate for oritavancin  Healthcare maintenance = Discussed importance of covid vaccine

## 2019-07-23 LAB — SEDIMENTATION RATE: Sed Rate: 9 mm/h (ref 0–20)

## 2019-07-23 LAB — C-REACTIVE PROTEIN: CRP: 1 mg/L (ref <8.0)

## 2019-07-26 ENCOUNTER — Ambulatory Visit
Admission: RE | Admit: 2019-07-26 | Discharge: 2019-07-26 | Disposition: A | Discharge status: Home / Self Care | Payer: Medicare Other | Source: Ambulatory Visit | Attending: Internal Medicine | Admitting: Internal Medicine

## 2019-07-26 ENCOUNTER — Ambulatory Visit: Payer: Medicare Other | Admitting: *Deleted

## 2019-07-26 ENCOUNTER — Other Ambulatory Visit: Payer: Self-pay

## 2019-07-26 DIAGNOSIS — L24A9 Irritant contact dermatitis due friction or contact with other specified body fluids: Secondary | ICD-10-CM

## 2019-07-26 DIAGNOSIS — T8131XS Disruption of external operation (surgical) wound, not elsewhere classified, sequela: Secondary | ICD-10-CM

## 2019-07-26 NOTE — Progress Notes (Signed)
Patient here for evaluation of his knee.  There is greater warmth in his left knee, the dry scab has fallen off. There are 2 new areas of fluctuance where the scabs were previously.  It is not draining yet, but per patient, it should "pop" and start to drain in a day or two. Patient did not get XRAY earlier, will get it today. He uses medicaid transportation, will need 3 days notice to schedule transportation to any visits.  Andree Coss, RN

## 2019-07-27 MED ORDER — DOXYCYCLINE HYCLATE 100 MG PO TABS
100.0000 mg | ORAL_TABLET | Freq: Two times a day (BID) | ORAL | 3 refills | Status: DC
Start: 2019-07-27 — End: 2019-09-25

## 2019-08-24 ENCOUNTER — Encounter (HOSPITAL_COMMUNITY): Payer: Self-pay

## 2019-08-24 ENCOUNTER — Emergency Department (HOSPITAL_COMMUNITY)
Admission: EM | Admit: 2019-08-24 | Discharge: 2019-08-24 | Disposition: A | Discharge status: Home / Self Care | Payer: Medicare Other | Attending: Emergency Medicine | Admitting: Emergency Medicine

## 2019-08-24 ENCOUNTER — Other Ambulatory Visit: Payer: Self-pay

## 2019-08-24 ENCOUNTER — Emergency Department (HOSPITAL_COMMUNITY): Payer: Medicare Other

## 2019-08-24 DIAGNOSIS — Z79899 Other long term (current) drug therapy: Secondary | ICD-10-CM | POA: Diagnosis not present

## 2019-08-24 DIAGNOSIS — I10 Essential (primary) hypertension: Secondary | ICD-10-CM | POA: Insufficient documentation

## 2019-08-24 DIAGNOSIS — F17210 Nicotine dependence, cigarettes, uncomplicated: Secondary | ICD-10-CM | POA: Insufficient documentation

## 2019-08-24 DIAGNOSIS — R079 Chest pain, unspecified: Secondary | ICD-10-CM

## 2019-08-24 DIAGNOSIS — Z20822 Contact with and (suspected) exposure to covid-19: Secondary | ICD-10-CM | POA: Diagnosis not present

## 2019-08-24 DIAGNOSIS — R0789 Other chest pain: Secondary | ICD-10-CM | POA: Insufficient documentation

## 2019-08-24 DIAGNOSIS — Z96652 Presence of left artificial knee joint: Secondary | ICD-10-CM | POA: Insufficient documentation

## 2019-08-24 LAB — BASIC METABOLIC PANEL
Anion gap: 8 (ref 5–15)
BUN: 10 mg/dL (ref 8–23)
CO2: 26 mmol/L (ref 22–32)
Calcium: 9.2 mg/dL (ref 8.9–10.3)
Chloride: 101 mmol/L (ref 98–111)
Creatinine, Ser: 0.67 mg/dL (ref 0.61–1.24)
GFR calc Af Amer: >60 mL/min (ref >60)
GFR calc non Af Amer: >60 mL/min (ref >60)
Glucose, Bld: 107 mg/dL — ABNORMAL HIGH (ref 70–99)
Potassium: 4.1 mmol/L (ref 3.5–5.1)
Sodium: 135 mmol/L (ref 135–145)

## 2019-08-24 LAB — CBC
HCT: 36.1 % — ABNORMAL LOW (ref 39.0–52.0)
Hemoglobin: 10.6 g/dL — ABNORMAL LOW (ref 13.0–17.0)
MCH: 24.3 pg — ABNORMAL LOW (ref 26.0–34.0)
MCHC: 29.4 g/dL — ABNORMAL LOW (ref 30.0–36.0)
MCV: 82.6 fL (ref 80.0–100.0)
Platelets: 284 10*3/uL (ref 150–400)
RBC: 4.37 MIL/uL (ref 4.22–5.81)
RDW: 18.4 % — ABNORMAL HIGH (ref 11.5–15.5)
WBC: 6.7 10*3/uL (ref 4.0–10.5)
nRBC: 0 % (ref 0.0–0.2)

## 2019-08-24 LAB — SARS CORONAVIRUS 2 BY RT PCR (HOSPITAL ORDER, PERFORMED IN ~~LOC~~ HOSPITAL LAB): SARS Coronavirus 2: NEGATIVE

## 2019-08-24 LAB — TROPONIN I (HIGH SENSITIVITY)
Troponin I (High Sensitivity): 6 ng/L (ref <18)
Troponin I (High Sensitivity): 6 ng/L (ref <18)

## 2019-08-24 LAB — D-DIMER, QUANTITATIVE: D-Dimer, Quant: 0.54 ug/mL-FEU — ABNORMAL HIGH (ref 0.00–0.50)

## 2019-08-24 MED ORDER — ALBUTEROL SULFATE HFA 108 (90 BASE) MCG/ACT IN AERS
2.0000 | INHALATION_SPRAY | RESPIRATORY_TRACT | Status: DC
  Administered 2019-08-24: 2 via RESPIRATORY_TRACT
  Filled 2019-08-24: qty 6.7

## 2019-08-24 NOTE — ED Triage Notes (Addendum)
Pt from home via ems; called out for concern that R arm and hand are tingly and numb, initially went into chest R side; x 2 days; no worse or better; no neuro deficits with ems; hx copd, CHF, asthma; labored breathing noted with ems, diminished lung sounds, 5 mg albuterol given PTA; congestion and rhonchi, decreased on R; dyspnea with exertion; pt denies sob; EKG unremarkable with ems  98% RA 152/107 RR 24 HR 80 T 97.62F

## 2019-08-24 NOTE — ED Notes (Signed)
Patient verbalizes understanding of discharge instructions. Opportunity for questioning and answers were provided. Pt discharged from ED. 

## 2019-08-24 NOTE — Discharge Instructions (Addendum)
You were seen in the ED for pain in your right chest as well as right hand and arm tingling  Work-up today was normal.  No evidence of cardiac cause.  Continue to monitor your symptoms.  Follow-up with your primary care doctor in the next 72 hours if your symptoms continue.  Please return to ED if: Your chest pain is worse or on exertion You have a cough that gets worse, or you cough up blood. You have severe pain in chest, back or abdomen. You have chest pain with loss of sensation, weakness or tingling in extremities You have chest pain or shortness of breath with exertion or activity You have sudden, unexplained discomfort in your chest, with radiation arms, back, neck, or jaw. You suddenly have chest pain and begin to sweat, or your skin gets clammy. You feel chest pain with nausea or vomiting, blood in vomit You suddenly feel light-headed or faint. Your heart begins to beat quickly, or it feels like it is skipping beats. You have one sided leg swelling or calf pain You have chest pain with fever, chills, cough or other viral symptoms
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
## 2019-08-26 ENCOUNTER — Ambulatory Visit: Payer: Medicare Other | Admitting: Internal Medicine

## 2019-08-28 ENCOUNTER — Ambulatory Visit: Payer: Medicare Other | Admitting: Podiatry

## 2019-09-04 ENCOUNTER — Emergency Department (HOSPITAL_COMMUNITY)
Admission: EM | Admit: 2019-09-04 | Discharge: 2019-09-04 | Disposition: A | Discharge status: Home / Self Care | Payer: Medicare Other | Attending: Emergency Medicine | Admitting: Emergency Medicine

## 2019-09-04 ENCOUNTER — Encounter (HOSPITAL_COMMUNITY): Payer: Self-pay | Admitting: Emergency Medicine

## 2019-09-04 DIAGNOSIS — R2242 Localized swelling, mass and lump, left lower limb: Secondary | ICD-10-CM | POA: Insufficient documentation

## 2019-09-04 DIAGNOSIS — Z79899 Other long term (current) drug therapy: Secondary | ICD-10-CM | POA: Diagnosis not present

## 2019-09-04 DIAGNOSIS — J449 Chronic obstructive pulmonary disease, unspecified: Secondary | ICD-10-CM | POA: Diagnosis not present

## 2019-09-04 DIAGNOSIS — I10 Essential (primary) hypertension: Secondary | ICD-10-CM | POA: Diagnosis not present

## 2019-09-04 DIAGNOSIS — Z96652 Presence of left artificial knee joint: Secondary | ICD-10-CM | POA: Diagnosis not present

## 2019-09-04 DIAGNOSIS — L0291 Cutaneous abscess, unspecified: Secondary | ICD-10-CM

## 2019-09-04 DIAGNOSIS — L02416 Cutaneous abscess of left lower limb: Secondary | ICD-10-CM | POA: Diagnosis not present

## 2019-09-04 DIAGNOSIS — F17210 Nicotine dependence, cigarettes, uncomplicated: Secondary | ICD-10-CM | POA: Insufficient documentation

## 2019-09-04 DIAGNOSIS — M79662 Pain in left lower leg: Secondary | ICD-10-CM | POA: Diagnosis present

## 2019-09-04 MED ORDER — LIDOCAINE HCL (PF) 1 % IJ SOLN
5.0000 mL | Freq: Once | INTRAMUSCULAR | Status: AC
  Administered 2019-09-04: 5 mL
  Filled 2019-09-04: qty 5

## 2019-09-04 NOTE — ED Notes (Signed)
Patient given discharge instructions. Questions were answered. Patient verbalized understanding of discharge instructions and care at home.  Pt discharged with PTAR

## 2019-09-04 NOTE — ED Triage Notes (Signed)
Pt here with c/o left knee pain , which looks to have an abscess on that knee pt thinks that he has been on antibiotics for that but he is not sure
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
## 2019-09-04 NOTE — Discharge Instructions (Addendum)
Wash daily with soap and water. Use Tylenol or ibuprofen as needed for pain. Continue taking doxycycline twice a day as prescribed. It is very important that you follow-up with your infectious disease doctor for further management of your antibiotics. Return to the emergency room if develop fevers, severe worsening pain, increased questioning from the area, your knee becomes red hot and swollen, or with any new, worsening, or concerning symptoms.

## 2019-09-18 ENCOUNTER — Ambulatory Visit: Payer: Medicare Other | Admitting: Internal Medicine

## 2019-09-25 ENCOUNTER — Emergency Department (HOSPITAL_COMMUNITY): Payer: Medicare Other

## 2019-09-25 ENCOUNTER — Emergency Department (HOSPITAL_COMMUNITY)
Admission: EM | Admit: 2019-09-25 | Discharge: 2019-09-26 | Disposition: A | Discharge status: Home / Self Care | Payer: Medicare Other | Source: Home / Self Care | Attending: Emergency Medicine | Admitting: Emergency Medicine

## 2019-09-25 ENCOUNTER — Emergency Department (HOSPITAL_COMMUNITY)
Admission: EM | Admit: 2019-09-25 | Discharge: 2019-09-25 | Disposition: A | Discharge status: Home / Self Care | Payer: Medicare Other | Source: Home / Self Care

## 2019-09-25 DIAGNOSIS — R0602 Shortness of breath: Secondary | ICD-10-CM | POA: Insufficient documentation

## 2019-09-25 DIAGNOSIS — Z79899 Other long term (current) drug therapy: Secondary | ICD-10-CM | POA: Insufficient documentation

## 2019-09-25 DIAGNOSIS — I10 Essential (primary) hypertension: Secondary | ICD-10-CM | POA: Insufficient documentation

## 2019-09-25 DIAGNOSIS — G8929 Other chronic pain: Secondary | ICD-10-CM | POA: Insufficient documentation

## 2019-09-25 DIAGNOSIS — M791 Myalgia, unspecified site: Secondary | ICD-10-CM | POA: Insufficient documentation

## 2019-09-25 DIAGNOSIS — F17210 Nicotine dependence, cigarettes, uncomplicated: Secondary | ICD-10-CM | POA: Insufficient documentation

## 2019-09-25 DIAGNOSIS — Z7951 Long term (current) use of inhaled steroids: Secondary | ICD-10-CM | POA: Insufficient documentation

## 2019-09-25 DIAGNOSIS — J45901 Unspecified asthma with (acute) exacerbation: Secondary | ICD-10-CM | POA: Insufficient documentation

## 2019-09-25 DIAGNOSIS — M25562 Pain in left knee: Secondary | ICD-10-CM | POA: Insufficient documentation

## 2019-09-25 DIAGNOSIS — Z5321 Procedure and treatment not carried out due to patient leaving prior to being seen by health care provider: Secondary | ICD-10-CM | POA: Insufficient documentation

## 2019-09-25 DIAGNOSIS — Z96652 Presence of left artificial knee joint: Secondary | ICD-10-CM | POA: Insufficient documentation

## 2019-09-25 DIAGNOSIS — M79662 Pain in left lower leg: Secondary | ICD-10-CM | POA: Insufficient documentation

## 2019-09-25 DIAGNOSIS — L03115 Cellulitis of right lower limb: Secondary | ICD-10-CM | POA: Insufficient documentation

## 2019-09-25 LAB — CBC
HCT: 36.5 % — ABNORMAL LOW (ref 39.0–52.0)
Hemoglobin: 10.8 g/dL — ABNORMAL LOW (ref 13.0–17.0)
MCH: 24.5 pg — ABNORMAL LOW (ref 26.0–34.0)
MCHC: 29.6 g/dL — ABNORMAL LOW (ref 30.0–36.0)
MCV: 83 fL (ref 80.0–100.0)
Platelets: 270 10*3/uL (ref 150–400)
RBC: 4.4 MIL/uL (ref 4.22–5.81)
RDW: 19.9 % — ABNORMAL HIGH (ref 11.5–15.5)
WBC: 8.1 10*3/uL (ref 4.0–10.5)
nRBC: 0 % (ref 0.0–0.2)

## 2019-09-25 LAB — BASIC METABOLIC PANEL
Anion gap: 10 (ref 5–15)
BUN: 8 mg/dL (ref 8–23)
CO2: 29 mmol/L (ref 22–32)
Calcium: 9.6 mg/dL (ref 8.9–10.3)
Chloride: 97 mmol/L — ABNORMAL LOW (ref 98–111)
Creatinine, Ser: 0.65 mg/dL (ref 0.61–1.24)
GFR calc Af Amer: >60 mL/min (ref >60)
GFR calc non Af Amer: >60 mL/min (ref >60)
Glucose, Bld: 106 mg/dL — ABNORMAL HIGH (ref 70–99)
Potassium: 4.4 mmol/L (ref 3.5–5.1)
Sodium: 136 mmol/L (ref 135–145)

## 2019-09-25 MED ORDER — OXYCODONE-ACETAMINOPHEN 5-325 MG PO TABS
1.0000 | ORAL_TABLET | ORAL | Status: DC | PRN
  Administered 2019-09-25: 1 via ORAL
  Filled 2019-09-25: qty 1

## 2019-09-25 MED ORDER — CLINDAMYCIN HCL 150 MG PO CAPS
300.0000 mg | ORAL_CAPSULE | Freq: Four times a day (QID) | ORAL | 0 refills | Status: DC
Start: 2019-09-25 — End: 2019-10-05
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
## 2019-09-25 NOTE — ED Triage Notes (Signed)
To ED via [facility] for eval of left knee pain. When EMS arrived at the home pt appeared sob. They gave him a albuterol tx with some relief. Also given 100 mcq Fentanyl for left knee pain. Pt with hx of copd and wears 5L O2 [place] intermittently during the day and all night long. Pt denies fevers at home. Pt with possible abscess to left knee area. Denies injury.

## 2019-09-25 NOTE — Discharge Instructions (Signed)
Please read and follow all provided instructions.  Your diagnoses today include:  1. Cellulitis of right lower extremity   2. Chronic pain of left lower extremity   3. SOB (shortness of breath)     Tests performed today include:  Blood counts and electrolytes  Chest x-ray -no pneumonia  X-ray of your leg -no broken bones  Vital signs. See below for your results today.   Medications prescribed:   Clindamycin - antibiotic  You have been prescribed an antibiotic medicine: take the entire course of medicine even if you are feeling better. Stopping early can cause the antibiotic not to work.  Take any prescribed medications only as directed.   Home care instructions:  Follow any educational materials contained in this packet. Keep affected area above the level of your heart when possible. Wash area gently twice a day with warm soapy water. Do not apply alcohol or hydrogen peroxide. Cover the area if it draining or weeping.   Follow-up instructions: Follow-up with your doctor and infectious disease doctor in the next 3 days for recheck.  Return instructions:  Return to the Emergency Department if you have:  Fever  Worsening symptoms  Worsening pain  Worsening swelling  Redness of the skin that moves away from the affected area, especially if it streaks away from the affected area   Any other emergent concerns  Your vital signs today were: BP (!) 140/93 (BP Location: Right Arm)    Pulse 64    Temp 98.1 F (36.7 C) (Oral)    Resp 18    SpO2 100%  If your blood pressure (BP) was elevated above 135/85 this visit, please have this repeated by your doctor within one month. --------------

## 2019-09-25 NOTE — ED Provider Notes (Signed)
Medical screening examination/treatment/procedure(s) were conducted as a shared visit with non-physician practitioner(s) and myself.  I personally evaluated the patient during the encounter. Briefly, the patient is a 64 y.o. male here for wound check.  Having some shortness of breath but overall on baseline oxygen.  Chest x-ray with no signs of pneumonia.  Overall clear breath sounds.  Not having any current shortness of breath on my evaluation.  Mostly concerned about left chronic shin wound.  Just finished antibiotics today and thinks that he needs continued antibiotics.  Does not have any fever or white count.  Does have some swelling to the left anterior shin but patient states that with warm compresses he is able to get drainage.  Do not feel that he needs I&D at this time.  Having some pain in the upper knee/left lower thigh area but there is no induration or swelling.  Likely more arthritic hip type pain but will get x-rays.  No concern for deep space infection and anticipate continuing antibiotics and having patient follow-up with infectious disease and primary care.  Understands to return if swelling enlarges as he may need an I&D.  Patient states that he feels comfortable with that plan as he has had several I&D's in the past at this time he does not think he needs one as well.  This chart was dictated using voice recognition software.  Despite best efforts to proofread,  errors can occur which can change the documentation meaning.    EKG Interpretation None           Virgina Norfolk, DO 09/25/19 2033

## 2019-09-25 NOTE — ED Triage Notes (Signed)
Pt here for left evaluation of left knee pain and shortness of breath. Pt waiting in lobby, discharged LWBS by previous shift.

## 2019-09-28 ENCOUNTER — Emergency Department (HOSPITAL_COMMUNITY): Payer: Medicare Other

## 2019-09-28 ENCOUNTER — Inpatient Hospital Stay (HOSPITAL_COMMUNITY)
Admission: EM | Admit: 2019-09-28 | Discharge: 2019-10-05 | DRG: 560 | Disposition: A | Discharge status: Home Health | Payer: Medicare Other | Attending: Internal Medicine | Admitting: Internal Medicine

## 2019-09-28 ENCOUNTER — Encounter (HOSPITAL_COMMUNITY): Payer: Self-pay | Admitting: Physician Assistant

## 2019-09-28 ENCOUNTER — Other Ambulatory Visit: Payer: Self-pay

## 2019-09-28 DIAGNOSIS — L02416 Cutaneous abscess of left lower limb: Secondary | ICD-10-CM | POA: Diagnosis present

## 2019-09-28 DIAGNOSIS — Z87820 Personal history of traumatic brain injury: Secondary | ICD-10-CM | POA: Diagnosis not present

## 2019-09-28 DIAGNOSIS — F17210 Nicotine dependence, cigarettes, uncomplicated: Secondary | ICD-10-CM | POA: Diagnosis present

## 2019-09-28 DIAGNOSIS — S86812A Strain of other muscle(s) and tendon(s) at lower leg level, left leg, initial encounter: Secondary | ICD-10-CM | POA: Diagnosis present

## 2019-09-28 DIAGNOSIS — Z20822 Contact with and (suspected) exposure to covid-19: Secondary | ICD-10-CM | POA: Diagnosis present

## 2019-09-28 DIAGNOSIS — E559 Vitamin D deficiency, unspecified: Secondary | ICD-10-CM | POA: Diagnosis present

## 2019-09-28 DIAGNOSIS — J449 Chronic obstructive pulmonary disease, unspecified: Secondary | ICD-10-CM | POA: Diagnosis present

## 2019-09-28 DIAGNOSIS — M1990 Unspecified osteoarthritis, unspecified site: Secondary | ICD-10-CM | POA: Diagnosis present

## 2019-09-28 DIAGNOSIS — M869 Osteomyelitis, unspecified: Secondary | ICD-10-CM | POA: Diagnosis not present

## 2019-09-28 DIAGNOSIS — Z9981 Dependence on supplemental oxygen: Secondary | ICD-10-CM

## 2019-09-28 DIAGNOSIS — Z981 Arthrodesis status: Secondary | ICD-10-CM | POA: Diagnosis not present

## 2019-09-28 DIAGNOSIS — N529 Male erectile dysfunction, unspecified: Secondary | ICD-10-CM | POA: Diagnosis present

## 2019-09-28 DIAGNOSIS — T8450XA Infection and inflammatory reaction due to unspecified internal joint prosthesis, initial encounter: Principal | ICD-10-CM | POA: Diagnosis present

## 2019-09-28 DIAGNOSIS — L02419 Cutaneous abscess of limb, unspecified: Secondary | ICD-10-CM

## 2019-09-28 DIAGNOSIS — L03115 Cellulitis of right lower limb: Secondary | ICD-10-CM | POA: Diagnosis present

## 2019-09-28 DIAGNOSIS — B9562 Methicillin resistant Staphylococcus aureus infection as the cause of diseases classified elsewhere: Secondary | ICD-10-CM | POA: Diagnosis present

## 2019-09-28 DIAGNOSIS — M00062 Staphylococcal arthritis, left knee: Secondary | ICD-10-CM | POA: Diagnosis present

## 2019-09-28 DIAGNOSIS — Y831 Surgical operation with implant of artificial internal device as the cause of abnormal reaction of the patient, or of later complication, without mention of misadventure at the time of the procedure: Secondary | ICD-10-CM | POA: Diagnosis present

## 2019-09-28 DIAGNOSIS — R0902 Hypoxemia: Secondary | ICD-10-CM | POA: Diagnosis present

## 2019-09-28 DIAGNOSIS — Z79899 Other long term (current) drug therapy: Secondary | ICD-10-CM

## 2019-09-28 DIAGNOSIS — K219 Gastro-esophageal reflux disease without esophagitis: Secondary | ICD-10-CM | POA: Diagnosis present

## 2019-09-28 DIAGNOSIS — M25462 Effusion, left knee: Secondary | ICD-10-CM | POA: Diagnosis present

## 2019-09-28 DIAGNOSIS — G8929 Other chronic pain: Secondary | ICD-10-CM | POA: Diagnosis present

## 2019-09-28 DIAGNOSIS — M7989 Other specified soft tissue disorders: Secondary | ICD-10-CM | POA: Diagnosis present

## 2019-09-28 DIAGNOSIS — I10 Essential (primary) hypertension: Secondary | ICD-10-CM | POA: Diagnosis present

## 2019-09-28 DIAGNOSIS — Z96653 Presence of artificial knee joint, bilateral: Secondary | ICD-10-CM | POA: Diagnosis present

## 2019-09-28 HISTORY — DX: Cutaneous abscess of limb, unspecified: L02.419

## 2019-09-28 LAB — BASIC METABOLIC PANEL
Anion gap: 10 (ref 5–15)
BUN: 8 mg/dL (ref 8–23)
CO2: 28 mmol/L (ref 22–32)
Calcium: 9.7 mg/dL (ref 8.9–10.3)
Chloride: 96 mmol/L — ABNORMAL LOW (ref 98–111)
Creatinine, Ser: 0.65 mg/dL (ref 0.61–1.24)
GFR calc Af Amer: >60 mL/min (ref >60)
GFR calc non Af Amer: >60 mL/min (ref >60)
Glucose, Bld: 108 mg/dL — ABNORMAL HIGH (ref 70–99)
Potassium: 4.4 mmol/L (ref 3.5–5.1)
Sodium: 134 mmol/L — ABNORMAL LOW (ref 135–145)

## 2019-09-28 LAB — CBC WITH DIFFERENTIAL/PLATELET
Abs Immature Granulocytes: 0.04 10*3/uL (ref 0.00–0.07)
Basophils Absolute: 0.1 10*3/uL (ref 0.0–0.1)
Basophils Relative: 1 %
Eosinophils Absolute: 0.2 10*3/uL (ref 0.0–0.5)
Eosinophils Relative: 2 %
HCT: 35.2 % — ABNORMAL LOW (ref 39.0–52.0)
Hemoglobin: 10.4 g/dL — ABNORMAL LOW (ref 13.0–17.0)
Immature Granulocytes: 0 %
Lymphocytes Relative: 10 %
Lymphs Abs: 1 10*3/uL (ref 0.7–4.0)
MCH: 24.5 pg — ABNORMAL LOW (ref 26.0–34.0)
MCHC: 29.5 g/dL — ABNORMAL LOW (ref 30.0–36.0)
MCV: 83 fL (ref 80.0–100.0)
Monocytes Absolute: 0.9 10*3/uL (ref 0.1–1.0)
Monocytes Relative: 9 %
Neutro Abs: 8.1 10*3/uL — ABNORMAL HIGH (ref 1.7–7.7)
Neutrophils Relative %: 78 %
Platelets: 306 10*3/uL (ref 150–400)
RBC: 4.24 MIL/uL (ref 4.22–5.81)
RDW: 20 % — ABNORMAL HIGH (ref 11.5–15.5)
WBC: 10.3 10*3/uL (ref 4.0–10.5)
nRBC: 0 % (ref 0.0–0.2)

## 2019-09-28 LAB — SARS CORONAVIRUS 2 BY RT PCR (HOSPITAL ORDER, PERFORMED IN ~~LOC~~ HOSPITAL LAB): SARS Coronavirus 2: NEGATIVE

## 2019-09-28 LAB — SEDIMENTATION RATE: Sed Rate: 1 mm/hr (ref 0–16)

## 2019-09-28 MED ORDER — VANCOMYCIN HCL 750 MG/150ML IV SOLN
750.0000 mg | Freq: Two times a day (BID) | INTRAVENOUS | Status: DC
  Administered 2019-09-29 – 2019-10-01 (×6): 750 mg via INTRAVENOUS
  Filled 2019-09-28 (×6): qty 150

## 2019-09-28 MED ORDER — FLUTICASONE FUROATE-VILANTEROL 200-25 MCG/INH IN AEPB
1.0000 | INHALATION_SPRAY | Freq: Every day | RESPIRATORY_TRACT | Status: DC
  Administered 2019-09-30 – 2019-10-04 (×5): 1 via RESPIRATORY_TRACT
  Filled 2019-09-28: qty 28

## 2019-09-28 MED ORDER — LISINOPRIL 20 MG PO TABS
20.0000 mg | ORAL_TABLET | Freq: Every day | ORAL | Status: DC
  Administered 2019-09-29 – 2019-10-04 (×6): 20 mg via ORAL
  Filled 2019-09-28 (×6): qty 1

## 2019-09-28 MED ORDER — OXYCODONE HCL 5 MG PO TABS
5.0000 mg | ORAL_TABLET | ORAL | Status: DC | PRN
  Administered 2019-09-28 – 2019-10-04 (×14): 5 mg via ORAL
  Filled 2019-09-28 (×14): qty 1

## 2019-09-28 MED ORDER — IBUPROFEN 200 MG PO TABS
400.0000 mg | ORAL_TABLET | Freq: Four times a day (QID) | ORAL | Status: DC | PRN
  Administered 2019-09-30: 400 mg via ORAL
  Filled 2019-09-28: qty 2

## 2019-09-28 MED ORDER — ALBUTEROL SULFATE HFA 108 (90 BASE) MCG/ACT IN AERS
2.0000 | INHALATION_SPRAY | RESPIRATORY_TRACT | Status: DC | PRN
  Administered 2019-09-28 – 2019-10-01 (×3): 2 via RESPIRATORY_TRACT
  Filled 2019-09-28: qty 6.7

## 2019-09-28 MED ORDER — PIPERACILLIN-TAZOBACTAM 3.375 G IVPB: 3.3750 g | Freq: Three times a day (TID) | INTRAVENOUS | Status: DC

## 2019-09-28 MED ORDER — OXYCODONE-ACETAMINOPHEN 5-325 MG PO TABS
2.0000 | ORAL_TABLET | Freq: Once | ORAL | Status: AC
  Administered 2019-09-28: 2 via ORAL
  Filled 2019-09-28: qty 2

## 2019-09-28 MED ORDER — VANCOMYCIN HCL IN DEXTROSE 1-5 GM/200ML-% IV SOLN
1000.0000 mg | Freq: Once | INTRAVENOUS | Status: AC
  Administered 2019-09-28: 1000 mg via INTRAVENOUS
  Filled 2019-09-28: qty 200

## 2019-09-28 MED ORDER — PIPERACILLIN-TAZOBACTAM 3.375 G IVPB
3.3750 g | Freq: Once | INTRAVENOUS | Status: AC
  Administered 2019-09-28: 3.375 g via INTRAVENOUS
  Filled 2019-09-28: qty 50

## 2019-09-28 MED ORDER — ALBUTEROL SULFATE (2.5 MG/3ML) 0.083% IN NEBU: 3.0000 mL | INHALATION_SOLUTION | RESPIRATORY_TRACT | Status: DC | PRN

## 2019-09-28 MED ORDER — SODIUM CHLORIDE 0.9 % IV SOLN
1.0000 g | Freq: Three times a day (TID) | INTRAVENOUS | Status: DC
  Administered 2019-09-28 – 2019-09-29 (×4): 1 g via INTRAVENOUS
  Filled 2019-09-28 (×5): qty 1

## 2019-09-28 MED ORDER — ALBUTEROL SULFATE (2.5 MG/3ML) 0.083% IN NEBU: 2.5000 mg | INHALATION_SOLUTION | Freq: Four times a day (QID) | RESPIRATORY_TRACT | Status: DC | PRN

## 2019-09-28 MED ORDER — ENOXAPARIN SODIUM 40 MG/0.4ML ~~LOC~~ SOLN
40.0000 mg | SUBCUTANEOUS | Status: DC
  Administered 2019-09-28 – 2019-10-04 (×7): 40 mg via SUBCUTANEOUS
  Filled 2019-09-28 (×7): qty 0.4

## 2019-09-28 MED ORDER — BACID PO TABS
2.0000 | ORAL_TABLET | Freq: Three times a day (TID) | ORAL | Status: DC
  Administered 2019-09-28 – 2019-10-04 (×20): 2 via ORAL
  Filled 2019-09-28 (×21): qty 2
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
## 2019-09-28 NOTE — Progress Notes (Addendum)
Pharmacy Antibiotic Note  [name] is a 64 y.o. male admitted on 09/28/2019 with osteomyelitis.  Pharmacy has been consulted for vancomycin and meropenem dosing.  Plan: Vancomycin 1g once, followed by Vancomycin 750 IV every 12 hours.  Goal trough 15-20 mcg/mL.  Meropenem 1g every 8 hours  Height: 5\' 7"  (170.2 cm) Weight: 54.4 kg (120 lb) IBW/kg (Calculated) : 66.1  Temp (24hrs), Avg:98.6 F (37 C), Min:98.3 F (36.8 C), Max:99 F (37.2 C)  Recent Labs  Lab 09/25/19 0855 09/28/19 1205  WBC 8.1 10.3  CREATININE 0.65 0.65    Estimated Creatinine Clearance: 71.8 mL/min (by C-G formula based on SCr of 0.65 mg/dL).    No Known Allergies  Antimicrobials this admission: Vancomycin 8/14 >>  Zosyn 8/14 >> 8/14 Meropenem 8/14 >>   Microbiology results: 8/14 Wound Cx: pending  Thank you for allowing pharmacy to be a part of this patient's care.  9/14, PharmD PGY-1 Acute Care Pharmacy Resident 09/28/2019 1:54 PM
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
## 2019-09-28 NOTE — ED Triage Notes (Signed)
Pt has left knee pain and says goes up into his left hip. Pt was here a few days ago with same symptoms
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
## 2019-09-29 ENCOUNTER — Encounter (HOSPITAL_COMMUNITY): Payer: Self-pay | Admitting: Internal Medicine

## 2019-09-29 LAB — BASIC METABOLIC PANEL
Anion gap: 8 (ref 5–15)
BUN: 10 mg/dL (ref 8–23)
CO2: 30 mmol/L (ref 22–32)
Calcium: 9.4 mg/dL (ref 8.9–10.3)
Chloride: 96 mmol/L — ABNORMAL LOW (ref 98–111)
Creatinine, Ser: 0.7 mg/dL (ref 0.61–1.24)
GFR calc Af Amer: >60 mL/min (ref >60)
GFR calc non Af Amer: >60 mL/min (ref >60)
Glucose, Bld: 101 mg/dL — ABNORMAL HIGH (ref 70–99)
Potassium: 4.6 mmol/L (ref 3.5–5.1)
Sodium: 134 mmol/L — ABNORMAL LOW (ref 135–145)

## 2019-09-29 LAB — CBC
HCT: 36.1 % — ABNORMAL LOW (ref 39.0–52.0)
Hemoglobin: 10.4 g/dL — ABNORMAL LOW (ref 13.0–17.0)
MCH: 24 pg — ABNORMAL LOW (ref 26.0–34.0)
MCHC: 28.8 g/dL — ABNORMAL LOW (ref 30.0–36.0)
MCV: 83.2 fL (ref 80.0–100.0)
Platelets: 296 10*3/uL (ref 150–400)
RBC: 4.34 MIL/uL (ref 4.22–5.81)
RDW: 20.1 % — ABNORMAL HIGH (ref 11.5–15.5)
WBC: 8.1 10*3/uL (ref 4.0–10.5)
nRBC: 0 % (ref 0.0–0.2)

## 2019-09-29 NOTE — Progress Notes (Signed)
Culture reviewed and growing Staph aureus in both cultures sent.   I will stop the meropenem and will continue to monitor the cultures. Gardiner Barefoot, MD

## 2019-09-29 NOTE — Plan of Care (Signed)

## 2019-09-29 NOTE — Progress Notes (Signed)
   ORTHOPAEDIC PROGRESS NOTE  s/p   SUBJECTIVE: Reports mild pain about I and D site on proximal tibia. No chest pain. No SOB. No nausea/vomiting. No other complaints.  Patient has tight congested cough and is requiring nasal canula O2 at this time OBJECTIVE: PE:  Vitals:   09/29/19 0356 09/29/19 0742  BP: 126/73 (!) 126/99  Pulse: 65 89  Resp: 15 16  Temp: 98.8 F (37.1 C) 97.6 F (36.4 C)  SpO2: 100% 100%     ASSESSMENT: HOYLE BARKDULL is a 64 y.o. male doing well postoperatively.  PLAN: Weightbearing: WBAT LLE Insicional and dressing care: dressing clean dry and intact Orthopedic device(s): None Showering: on Tuesday VTE prophylaxis: Lovenox 40mg  qd Pain control: oxycodone Follow - up plan: awaiting culture results and ID recommendations for definitive treatment  Will add physical therapy for gait training with WBAT with a walker Contact information:    Patient ID: JORDYN DOANE, male   DOB: 09/22/1955, 64 y.o.   MRN: [id number]

## 2019-09-29 NOTE — Consult Note (Signed)
WOC Nurse Consult Note: Reason for Consult: Consulted for wound care recommendations. See by Orthopedics PA-C Kirstin Shepperson yesterday and today.  Wound type:Chronic draining wound, I&D site Pressure Injury POA: N/A Measurement: 1.4cm round and 1.2cm round Wound bed: red, moist  Drainage (amount, consistency, odor) purulent yellow drainage with musty odor Periwound: intact, dry Dressing procedure/placement/frequency: I have amended orders to include a once daily soap and water cleanse, rinse and pat dry prior to placement of an antimicrobial nonadherent, xeroform gauze prior to placement of dry gauze, Kerlix roll gauze and ACE bandage. This will be performed daily. We will float heels to prevent pressure injury.  I defer all other POC directives to Orthopedics.  WOC nursing team will not follow, but will remain available to this patient, the nursing and medical teams.  Please re-consult if needed. Thanks, Ladona Mow, MSN, RN, GNP, [place]  Pager# [phone]

## 2019-09-29 NOTE — Progress Notes (Addendum)
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  He was admitted to the hospital and started on vancomycin and Zosyn.  The orthopedic surgery and infectious disease team was consulted for further recommendation.   New events last 24 hours / Subjective: Vancomycin remains, Zosyn was changed to Merrem based off previous cultures by the infectious disease team.  Based on today's cultures, ID stopped the Merrem. The orthopedic surgery team is decided to hold off on any procedure to see how he does on IV antibiotics.  Patient reports his thighs quite sore.  He is not having any fevers.  He has a wrap around his upper tibial area.  No nausea, vomiting, chest pain, or shortness of breath.  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  Vancomycin  Merrem d/c'd  Appreciate ID and Ortho  Oxycodone 5 mg q 4 hrs prn pain  PT  Active Problems:   Chronic obstructive airway disease with asthma (HCC)  Breo daily  Albuterol prn    Essential hypertension  Lisinopril 20 mg/d    Nicotine dependence, cigarettes, uncomplicated  Counsel on cessation  DVT prophylaxis:  enoxaparin (LOVENOX) injection 40 mg Start: 09/28/19 1330  Code Status: Full Family Communication: Self Disposition Plan:   Status is: Inpatient  Remains inpatient appropriate because:IV treatments appropriate due to intensity of illness or inability to take PO   Dispo: The patient is from: Home              Anticipated d/c is to:  Home              Anticipated d/c date is: 2 days              Patient currently is not medically stable to d/c.         Consultants:   ID  Ortho  Procedures:   None this admission  Antimicrobials:  Anti-infectives (From admission, onward)   Start     Dose/Rate Route Frequency Ordered Stop   09/29/19 0200  vancomycin (VANCOREADY) IVPB 750 mg/150 mL     Discontinue     750 mg 150 mL/hr over 60 Minutes Intravenous Every 12 hours 09/28/19 1349     09/28/19 2200  piperacillin-tazobactam (ZOSYN) IVPB 3.375 g  Status:  Discontinued        3.375 g 12.5 mL/hr over 240 Minutes Intravenous Every 8 hours 09/28/19 1349 09/28/19 1533   09/28/19 1615  meropenem (MERREM) 1 g in sodium chloride 0.9 % 100 mL IVPB     Discontinue     1 g 200 mL/hr over 30 Minutes Intravenous Every 8 hours 09/28/19 1604     09/28/19 1300  vancomycin (VANCOCIN) IVPB 1000 mg/200 mL premix        1,000 mg 200 mL/hr over 60 Minutes Intravenous  Once 09/28/19 1246 09/28/19 1450   09/28/19 1300  piperacillin-tazobactam (ZOSYN) IVPB 3.375 g        3.375 g 12.5 mL/hr over 240 Minutes Intravenous  Once 09/28/19 1246 09/28/19 1519  Objective: Vitals:   09/28/19 1610 09/28/19 1929 09/29/19 0356 09/29/19 0742  BP: (!) 155/97 (!) 140/98 126/73 (!) 126/99  Pulse: 95 79 65 89  Resp: 14 18 15 16   Temp: 97.8 F (36.6 C) 98.3 F (36.8 C) 98.8 F (37.1 C) 97.6 F (36.4 C)  TempSrc: Oral Oral Oral Oral  SpO2: 97% 100% 100% 100%  Weight:      Height:        Intake/Output Summary (Last 24 hours) at 09/29/2019 1212 Last data filed at 09/29/2019 1100 Gross per 24 hour  Intake 1279.02 ml  Output 500 ml  Net 779.02 ml   Filed Weights   09/28/19 0134  Weight: 54.4 kg    Examination:  General exam: Appears calm and comfortable  Respiratory system: Clear to auscultation. Respiratory effort normal. No respiratory distress. No conversational dyspnea.  Cardiovascular system: S1 & S2 heard, RRR.    Gastrointestinal system: Abdomen is nondistended, soft and nontender. Normal bowel sounds heard. Central nervous system: Alert and oriented. No focal neurological deficits. Speech clear.  Extremities: Dressing over prox L leg, +TTP over distal anterior L thigh  Skin: No rashes, lesions or ulcers on exposed skin  Psychiatry: Judgement and insight appear normal. Mood & affect appropriate.   Data Reviewed: I have personally reviewed following labs and imaging studies  CBC: Recent Labs  Lab 09/25/19 0855 09/28/19 1205 09/29/19 0138  WBC 8.1 10.3 8.1  NEUTROABS  --  8.1*  --   HGB 10.8* 10.4* 10.4*  HCT 36.5* 35.2* 36.1*  MCV 83.0 83.0 83.2  PLT 270 306 296   Basic Metabolic Panel: Recent Labs  Lab 09/25/19 0855 09/28/19 1205 09/29/19 0138  NA 136 134* 134*  K 4.4 4.4 4.6  CL 97* 96* 96*  CO2 29 28 30   GLUCOSE 106* 108* 101*  BUN 8 8 10   CREATININE 0.65 0.65 0.70  CALCIUM 9.6 9.7 9.4   GFR: Estimated Creatinine Clearance: 71.8 mL/min (by C-G formula based on SCr of 0.7 mg/dL). Liver Function Tests: No results for input(s): AST, ALT, ALKPHOS, BILITOT, PROT, ALBUMIN in the last 168 hours. No results for input(s): LIPASE, AMYLASE in the last 168 hours. No results for input(s): AMMONIA in the last 168 hours. Coagulation Profile: No results for input(s): INR, PROTIME in the last 168 hours. Cardiac Enzymes: No results for input(s): CKTOTAL, CKMB, CKMBINDEX, TROPONINI in the last 168 hours. BNP (last 3 results) No results for input(s): PROBNP in the last 8760 hours. HbA1C: No results for input(s): HGBA1C in the last 72 hours. CBG: No results for input(s): GLUCAP in the last 168 hours. Lipid Profile: No results for input(s): CHOL, HDL, LDLCALC, TRIG, CHOLHDL, LDLDIRECT in the last 72 hours. Thyroid Function Tests: No results for input(s): TSH, T4TOTAL, FREET4, T3FREE, THYROIDAB in the last 72 hours. Anemia Panel: No results for input(s): VITAMINB12, FOLATE, FERRITIN,  TIBC, IRON, RETICCTPCT in the last 72 hours. Sepsis Labs: No results for input(s): PROCALCITON, LATICACIDVEN in the last 168 hours.  Recent Results (from the past 240 hour(s))  Aerobic/Anaerobic Culture (surgical/deep wound)     Status: None (Preliminary result)   Collection Time: 09/28/19 12:05 PM   Specimen: Abscess; Wound  Result Value Ref Range Status   Specimen Description ABSCESS  Final   Special Requests LEFT LEG SPEC B  Final   Gram Stain   Final    FEW WBC PRESENT, PREDOMINANTLY PMN RARE GRAM POSITIVE COCCI IN CLUSTERS Performed at [facility], [address] [place], [place] [address]    Culture PENDING  Incomplete   Report Status PENDING  Incomplete  Aerobic/Anaerobic Culture (surgical/deep wound)     Status: None (Preliminary result)   Collection Time: 09/28/19 12:11 PM   Specimen: Abscess  Result Value Ref Range Status   Specimen Description ABSCESS  Final   Special Requests LEFT LEG SPEC A  Final   Gram Stain   Final    MODERATE WBC PRESENT, PREDOMINANTLY PMN FEW GRAM POSITIVE COCCI IN PAIRS IN CLUSTERS Performed at [facility], [address] [place], [place] [address]    Culture PENDING  Incomplete   Report Status PENDING  Incomplete  SARS Coronavirus 2 by RT PCR (hospital order, performed in [facility] hospital lab) Nasopharyngeal Nasopharyngeal Swab     Status: None   Collection Time: 09/28/19  2:09 PM   Specimen: Nasopharyngeal Swab  Result Value Ref Range Status   SARS Coronavirus 2 NEGATIVE NEGATIVE Final    Comment: (NOTE) SARS-CoV-2 target nucleic acids are NOT DETECTED.  The SARS-CoV-2 RNA is generally detectable in upper and lower respiratory specimens during the acute phase of infection. The lowest concentration of SARS-CoV-2 viral copies this assay can detect is 250 copies / mL. A negative result does not preclude SARS-CoV-2 infection and should not be used as the sole basis for treatment or other patient management decisions.  A  negative result may occur with improper specimen collection / handling, submission of specimen other than nasopharyngeal swab, presence of viral mutation(s) within the areas targeted by this assay, and inadequate number of viral copies (<250 copies / mL). A negative result must be combined with clinical observations, patient history, and epidemiological information.  Fact Sheet for Patients:   BoilerBrush.com.cyhttps://www.fda.gov/media/136312/download  Fact Sheet for Healthcare Providers: https://pope.com/https://www.fda.gov/media/136313/download  This test is not yet approved or  cleared by the [place] FDA and has been authorized for detection and/or diagnosis of SARS-CoV-2 by FDA under an Emergency Use Authorization (EUA).  This EUA will remain in effect (meaning this test can be used) for the duration of the COVID-19 declaration under Section 564(b)(1) of the Act, 21 U.S.C. section 360bbb-3(b)(1), unless the authorization is terminated or revoked sooner.  Performed at [facility], [address] [place], [place] [address]       Radiology Studies: DG Knee 2 Views Left  Result Date: 09/28/2019 CLINICAL DATA:  Pain proximal to the left knee joint EXAM: LEFT KNEE - 1-2 VIEW COMPARISON:  09/25/2019, 07/26/2019, 06/29/2018 FINDINGS: Incompletely visualized prosthesis extending from the femur to the tibia with prior resection of the distal femur and knee joint. Chronic posttraumatic/postsurgical deformity of the proximal tibia. Multiple chronic calcifications about the expected location of the knee. Prominent soft tissue swelling which has increased compared to prior. No soft tissue emphysema. Vascular calcifications. IMPRESSION: 1. Incompletely visualized prosthesis extending from the femur to the tibia with prior resection of the distal femur and knee joint. 2. Increased anterior soft tissue swelling at the expected level of the knee. Findings could be secondary to trauma, inflammation, or infection.  Electronically Signed   By: Jasmine PangKim  Fujinaga M.D.   On: 09/28/2019 02:30      Scheduled Meds: . enoxaparin (LOVENOX) injection  40 mg Subcutaneous Q24H  . fluticasone furoate-vilanterol  1 puff Inhalation Daily  . lactobacillus acidophilus  2 tablet Oral TID  . lisinopril  20 mg Oral Q breakfast   Continuous Infusions: . meropenem (MERREM) IV 1 g (09/29/19 0532)  .  vancomycin 750 mg (09/29/19 0205)     LOS: 1 day      Time spent: 30 minutes   Sharlene Dory, DO [facility] 09/29/2019, 12:12 PM   Available via Epic secure chat 7am-7pm After these hours, please refer to coverage provider listed on [email or web address]

## 2019-09-30 ENCOUNTER — Inpatient Hospital Stay: Payer: Self-pay

## 2019-09-30 LAB — CBC
HCT: 34.3 % — ABNORMAL LOW (ref 39.0–52.0)
Hemoglobin: 10 g/dL — ABNORMAL LOW (ref 13.0–17.0)
MCH: 24.3 pg — ABNORMAL LOW (ref 26.0–34.0)
MCHC: 29.2 g/dL — ABNORMAL LOW (ref 30.0–36.0)
MCV: 83.3 fL (ref 80.0–100.0)
Platelets: 317 10*3/uL (ref 150–400)
RBC: 4.12 MIL/uL — ABNORMAL LOW (ref 4.22–5.81)
RDW: 19.5 % — ABNORMAL HIGH (ref 11.5–15.5)
WBC: 7.9 10*3/uL (ref 4.0–10.5)
nRBC: 0 % (ref 0.0–0.2)

## 2019-09-30 LAB — BASIC METABOLIC PANEL
Anion gap: 8 (ref 5–15)
BUN: 6 mg/dL — ABNORMAL LOW (ref 8–23)
CO2: 31 mmol/L (ref 22–32)
Calcium: 9.4 mg/dL (ref 8.9–10.3)
Chloride: 94 mmol/L — ABNORMAL LOW (ref 98–111)
Creatinine, Ser: 0.62 mg/dL (ref 0.61–1.24)
GFR calc Af Amer: >60 mL/min (ref >60)
GFR calc non Af Amer: >60 mL/min (ref >60)
Glucose, Bld: 104 mg/dL — ABNORMAL HIGH (ref 70–99)
Potassium: 4.4 mmol/L (ref 3.5–5.1)
Sodium: 133 mmol/L — ABNORMAL LOW (ref 135–145)

## 2019-09-30 MED ORDER — RIFAMPIN 300 MG PO CAPS
300.0000 mg | ORAL_CAPSULE | Freq: Two times a day (BID) | ORAL | Status: DC
  Administered 2019-09-30 – 2019-10-04 (×10): 300 mg via ORAL
  Filled 2019-09-30 (×10): qty 1

## 2019-09-30 NOTE — Plan of Care (Addendum)
  Problem: Clinical Measurements: Goal: Ability to maintain clinical measurements within normal limits will improve Outcome: Progressing   Problem: Activity: Goal: Risk for activity intolerance will decrease Outcome: Progressing   Problem: Nutrition: Goal: Adequate nutrition will be maintained Outcome: Progressing   Problem: Coping: Goal: Level of anxiety will decrease Outcome: Progressing   Problem: Elimination: Goal: Will not experience complications related to bowel motility Outcome: Progressing   Problem: Pain Managment: Goal: General experience of comfort will improve Outcome: Progressing   Problem: Safety: Goal: Ability to remain free from injury will improve Outcome: Progressing   Problem: Skin Integrity: Goal: Risk for impaired skin integrity will decrease Outcome: Progressing    Dressing to L leg changed per order. Pt tolerated well, 2/10 pain.

## 2019-09-30 NOTE — Progress Notes (Signed)
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  He was admitted to the hospital and started on vancomycin and Zosyn.  The orthopedic surgery and infectious disease team was consulted for further recommendation. Cultures showed staph aureus x2.    New events last 24 hours / Subjective: Merrem stopped. Vanco continued. Rifampin added. Ortho would prefer to follow up outpatient if stable. Pt reports pain is about the same. No fevers, spreading redness, CP or sob.  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 Lajoyce Corners next week             Oxycodone 5 mg q 4 hrs prn pain             PT  Active Problems:   Chronic obstructive airway disease with asthma (HCC)             Breo daily             Albuterol prn    Essential hypertension             Lisinopril 20 mg/d    Nicotine dependence, cigarettes, uncomplicated             Counsel on cessation  DVT prophylaxis:  enoxaparin (LOVENOX) injection 40 mg Start: 09/28/19 1330  Code Status: Full Family Communication: Self Disposition Plan:   Status is: Inpatient  Remains inpatient appropriate because:Inpatient level of care appropriate due to severity of illness   Dispo: The patient is from: Home              Anticipated d/c is to: Home               Anticipated d/c date is: 2 days              Patient currently is not medically stable to d/c.   Consultants:   ID  Ortho  Procedures:   None  Antimicrobials:  Anti-infectives (From admission, onward)   Start     Dose/Rate Route Frequency Ordered Stop   09/30/19 1000  rifampin (RIFADIN) capsule 300 mg     Discontinue     300 mg Oral Every 12 hours 09/30/19 0945     09/29/19 0200  vancomycin (VANCOREADY) IVPB 750 mg/150 mL     Discontinue     750 mg 150 mL/hr over 60 Minutes Intravenous Every 12 hours 09/28/19 1349     09/28/19 2200  piperacillin-tazobactam (ZOSYN) IVPB 3.375 g  Status:  Discontinued        3.375 g 12.5 mL/hr over 240 Minutes Intravenous Every 8 hours 09/28/19 1349 09/28/19 1533   09/28/19 1615  meropenem (MERREM) 1 g in  sodium chloride 0.9 % 100 mL IVPB  Status:  Discontinued        1 g 200 mL/hr over 30 Minutes Intravenous Every 8 hours 09/28/19 1604 09/29/19 1339   09/28/19 1300  vancomycin (VANCOCIN) IVPB 1000 mg/200 mL premix        1,000 mg 200 mL/hr over 60 Minutes Intravenous  Once 09/28/19 1246 09/28/19 1450   09/28/19 1300  piperacillin-tazobactam (ZOSYN) IVPB 3.375 g        3.375 g 12.5 mL/hr over 240 Minutes Intravenous  Once 09/28/19 1246 09/28/19 1519        Objective: Vitals:   09/29/19 0742 09/29/19 1435 09/29/19 1950 09/30/19 0815  BP: (!) 126/99 113/72 125/77 125/78  Pulse: 89 81 67 79  Resp: 16 15 16 17   Temp: 97.6 F (36.4 C) 97.9 F (36.6 C) 98.3 F (36.8 C) 98.2 F (36.8 C)  TempSrc: Oral Oral Oral Oral  SpO2: 100% 99% 100% 98%  Weight:      Height:        Intake/Output Summary (Last 24 hours) at 09/30/2019 1431 Last data filed at 09/30/2019 1146 Gross per 24 hour  Intake 600 ml  Output 2230 ml  Net -1630 ml   Filed Weights   09/28/19 0134  Weight: 54.4 kg    Examination:  General exam: Appears calm and comfortable  Respiratory system: Clear to auscultation. Respiratory effort normal. No respiratory distress. No  conversational dyspnea.  Cardiovascular system: S1 & S2 heard, RRR. Gastrointestinal system: Abdomen is nondistended, soft and nontender. Normal bowel sounds heard. Central nervous system: Alert and oriented.  Extremities: Symmetric in appearance  Skin: No rashes, lesions or ulcers on exposed skin  Psychiatry: Judgement and insight appear normal. Mood & affect appropriate.   Data Reviewed: I have personally reviewed following labs and imaging studies  CBC: Recent Labs  Lab 09/25/19 0855 09/28/19 1205 09/29/19 0138 09/30/19 0031  WBC 8.1 10.3 8.1 7.9  NEUTROABS  --  8.1*  --   --   HGB 10.8* 10.4* 10.4* 10.0*  HCT 36.5* 35.2* 36.1* 34.3*  MCV 83.0 83.0 83.2 83.3  PLT 270 306 296 317   Basic Metabolic Panel: Recent Labs  Lab 09/25/19 0855 09/28/19 1205 09/29/19 0138 09/30/19 0031  NA 136 134* 134* 133*  K 4.4 4.4 4.6 4.4  CL 97* 96* 96* 94*  CO2 29 28 30 31   GLUCOSE 106* 108* 101* 104*  BUN 8 8 10  6*  CREATININE 0.65 0.65 0.70 0.62  CALCIUM 9.6 9.7 9.4 9.4   GFR: Estimated Creatinine Clearance: 71.8 mL/min (by C-G formula based on SCr of 0.62 mg/dL).  Recent Results (from the past 240 hour(s))  Aerobic/Anaerobic Culture (surgical/deep wound)     Status: None (Preliminary result)   Collection Time: 09/28/19 12:05 PM   Specimen: Abscess; Wound  Result Value Ref Range Status   Specimen Description ABSCESS  Final   Special Requests LEFT LEG SPEC B  Final   Gram Stain   Final    FEW WBC PRESENT, PREDOMINANTLY PMN RARE GRAM POSITIVE COCCI IN CLUSTERS Performed at [facility], [address] [place], [facility] [address]    Culture   Final    FEW METHICILLIN RESISTANT STAPHYLOCOCCUS AUREUS NO ANAEROBES ISOLATED; CULTURE IN PROGRESS FOR 5 DAYS    Report Status PENDING  Incomplete   Organism ID, Bacteria METHICILLIN RESISTANT STAPHYLOCOCCUS AUREUS  Final      Susceptibility   Methicillin resistant staphylococcus aureus - MIC*  CIPROFLOXACIN >=8 RESISTANT  Resistant     ERYTHROMYCIN >=8 RESISTANT Resistant     GENTAMICIN <=0.5 SENSITIVE Sensitive     OXACILLIN >=4 RESISTANT Resistant     TETRACYCLINE >=16 RESISTANT Resistant     VANCOMYCIN 1 SENSITIVE Sensitive     TRIMETH/SULFA <=10 SENSITIVE Sensitive     CLINDAMYCIN >=8 RESISTANT Resistant     RIFAMPIN <=0.5 SENSITIVE Sensitive     Inducible Clindamycin NEGATIVE Sensitive     * FEW METHICILLIN RESISTANT STAPHYLOCOCCUS AUREUS  Aerobic/Anaerobic Culture (surgical/deep wound)     Status: None (Preliminary result)   Collection Time: 09/28/19 12:11 PM   Specimen: Abscess  Result Value Ref Range Status   Specimen Description ABSCESS  Final   Special Requests LEFT LEG SPEC A  Final   Gram Stain   Final    MODERATE WBC PRESENT, PREDOMINANTLY PMN FEW GRAM POSITIVE COCCI IN PAIRS IN CLUSTERS Performed at [facility], [address] [place], [place] [address]    Culture   Final    FEW METHICILLIN RESISTANT STAPHYLOCOCCUS AUREUS NO ANAEROBES ISOLATED; CULTURE IN PROGRESS FOR 5 DAYS    Report Status PENDING  Incomplete   Organism ID, Bacteria METHICILLIN RESISTANT STAPHYLOCOCCUS AUREUS  Final      Susceptibility   Methicillin resistant staphylococcus aureus - MIC*    CIPROFLOXACIN >=8 RESISTANT Resistant     ERYTHROMYCIN >=8 RESISTANT Resistant     GENTAMICIN <=0.5 SENSITIVE Sensitive     OXACILLIN >=4 RESISTANT Resistant     TETRACYCLINE >=16 RESISTANT Resistant     VANCOMYCIN 1 SENSITIVE Sensitive     TRIMETH/SULFA <=10 SENSITIVE Sensitive     CLINDAMYCIN >=8 RESISTANT Resistant     RIFAMPIN <=0.5 SENSITIVE Sensitive     Inducible Clindamycin NEGATIVE Sensitive     * FEW METHICILLIN RESISTANT STAPHYLOCOCCUS AUREUS  SARS Coronavirus 2 by RT PCR (hospital order, performed in [facility] hospital lab) Nasopharyngeal Nasopharyngeal Swab     Status: None   Collection Time: 09/28/19  2:09 PM   Specimen: Nasopharyngeal Swab  Result Value Ref Range Status   SARS Coronavirus 2  NEGATIVE NEGATIVE Final    Comment: (NOTE) SARS-CoV-2 target nucleic acids are NOT DETECTED.  The SARS-CoV-2 RNA is generally detectable in upper and lower respiratory specimens during the acute phase of infection. The lowest concentration of SARS-CoV-2 viral copies this assay can detect is 250 copies / mL. A negative result does not preclude SARS-CoV-2 infection and should not be used as the sole basis for treatment or other patient management decisions.  A negative result may occur with improper specimen collection / handling, submission of specimen other than nasopharyngeal swab, presence of viral mutation(s) within the areas targeted by this assay, and inadequate number of viral copies (<250 copies / mL). A negative result must be combined with clinical observations, patient history, and epidemiological information.  Fact Sheet for Patients:   BoilerBrush.com.cyhttps://www.fda.gov/media/136312/download  Fact Sheet for Healthcare Providers: https://pope.com/https://www.fda.gov/media/136313/download  This test is not yet approved or  cleared by the [place] FDA and has been authorized for detection and/or diagnosis of SARS-CoV-2 by FDA under an Emergency Use Authorization (EUA).  This EUA will remain in effect (meaning this test can be used) for the duration of the COVID-19 declaration under Section 564(b)(1) of the Act, 21 U.S.C. section 360bbb-3(b)(1), unless the authorization is terminated or revoked sooner.  Performed at [facility], [address] [place], [place] [address]      Scheduled Meds: .  enoxaparin (LOVENOX) injection  40 mg Subcutaneous Q24H  . fluticasone furoate-vilanterol  1 puff Inhalation Daily  . lactobacillus acidophilus  2 tablet Oral TID  . lisinopril  20 mg Oral Q breakfast  . rifampin  300 mg Oral Q12H   Continuous Infusions: . vancomycin 750 mg (09/30/19 1415)     LOS: 2 days    Time spent: 25 minutes   Sharlene Dory, DO [facility] 09/30/2019, 2:31 PM   Available via Epic secure chat 7am-7pm After these hours, please refer to coverage provider listed on [email or web address]
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
## 2019-09-30 NOTE — Progress Notes (Signed)
[facility]  Date of Admission:  09/28/2019      Total days of antibiotics 2         ASSESSMENT: [name] is a 64 y.o. 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 Mayer Camel and Percell Miller. Cultures at that time in 2017 Enterobacter + Enterococcus. He has not required any suppressive antibiotics for these over the last few years.   Recently over the last 3 weeks he has had recurrent anterior tibial abscesses and cellulitis requiring bedside I&Ds and PO antibiotic courses. Cultures from purulent drainage reveal MRSA that is now resistant to doxycycline, which he was treated with recently. Will add rifampin given extensive orthopedic hardware and recurrent abscesses. Ulcers today are still draining purulent bloody fluid from 3 sites to the anterior shin. He has very thin skin overlying area without much adipose tissue. ?if he had incomplete drainage from previous encounter vs new evolved site.   Will check CRP & ESR. May need to consider CT of the left leg to look for deeper abscess contributing to pain that may need at least debridement. Plan for longer course of therapy 4-6 weeks given concern for bone/hardware.   May need to consider suppressive bactrim after in an attempt to avoid high amputation.    PLAN: 1. Continue vancomycin IV 2. Add rifampin PO BID 3. ESR / CRP in AM 4. 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 enoxaparin (LOVENOX) injection  40 mg Subcutaneous Q24H  . fluticasone furoate-vilanterol  1 puff Inhalation Daily  . lactobacillus  acidophilus  2 tablet Oral TID  . lisinopril  20 mg Oral Q breakfast  . rifampin  300 mg Oral Q12H     SUBJECTIVE: Feeling better than when he came in.  No trouble with antibiotics. Just has pain at the knee joint and above hear other incisions from original surgery.  Xray done on admission - no CT.    Review of Systems: Review of Systems  Constitutional: Negative for chills, fever, malaise/fatigue and weight loss.  Respiratory: Negative for cough and sputum production.   Cardiovascular: Negative for chest pain and leg swelling.  Gastrointestinal: Negative for abdominal pain, diarrhea and vomiting.  Genitourinary: Negative for dysuria and flank pain.  Musculoskeletal: Positive for joint pain (L thigh / knee). Negative for myalgias and neck pain.  Skin:       Abscesses draining from anterior thigh  Neurological: Negative for dizziness, tingling and headaches.  Psychiatric/Behavioral: Negative for depression and substance abuse. The patient is not nervous/anxious and does not have insomnia.     No Known Allergies  OBJECTIVE: Vitals:   09/29/19 0742 09/29/19 1435 09/29/19 1950 09/30/19 0815  BP: (!) 126/99 113/72 125/77 125/78  Pulse: 89 81 67 79  Resp: '16 15 16 17  ' Temp: 97.6 F (36.4 C) 97.9 F (36.6 C) 98.3 F (36.8 C) 98.2 F (36.8 C)  TempSrc: Oral Oral Oral Oral  SpO2: 100% 99% 100% 98%  Weight:      Height:       Body mass index is  18.79 kg/m.  Physical Exam Constitutional:      Comments: Resting comfortably in bed.   Musculoskeletal:       Legs:  Skin:    General: Skin is warm and dry.     Capillary Refill: Capillary refill takes less than 2 seconds.     Comments: Thin skin overlying left anterior leg. Small draining areas noted with purulence and blood.   Neurological:     Mental Status: He is alert and oriented to person, place, and time.     Lab Results Lab Results  Component Value Date   WBC 7.9 09/30/2019   HGB 10.0 (L) 09/30/2019   HCT  34.3 (L) 09/30/2019   MCV 83.3 09/30/2019   PLT 317 09/30/2019    Lab Results  Component Value Date   CREATININE 0.62 09/30/2019   BUN 6 (L) 09/30/2019   NA 133 (L) 09/30/2019   K 4.4 09/30/2019   CL 94 (L) 09/30/2019   CO2 31 09/30/2019    Lab Results  Component Value Date   ALT 18 04/13/2019   AST 21 04/13/2019   ALKPHOS 71 04/13/2019   BILITOT 0.1 (L) 04/13/2019     Microbiology: Recent Results (from the past 240 hour(s))  Aerobic/Anaerobic Culture (surgical/deep wound)     Status: None (Preliminary result)   Collection Time: 09/28/19 12:05 PM   Specimen: Abscess; Wound  Result Value Ref Range Status   Specimen Description ABSCESS  Final   Special Requests LEFT LEG SPEC B  Final   Gram Stain   Final    FEW WBC PRESENT, PREDOMINANTLY PMN RARE GRAM POSITIVE COCCI IN CLUSTERS Performed at [facility], [address], [place], [place] [address]    Culture   Final    FEW METHICILLIN RESISTANT STAPHYLOCOCCUS AUREUS NO ANAEROBES ISOLATED; CULTURE IN PROGRESS FOR 5 DAYS    Report Status PENDING  Incomplete   Organism ID, Bacteria METHICILLIN RESISTANT STAPHYLOCOCCUS AUREUS  Final      Susceptibility   Methicillin resistant staphylococcus aureus - MIC*    CIPROFLOXACIN >=8 RESISTANT Resistant     ERYTHROMYCIN >=8 RESISTANT Resistant     GENTAMICIN <=0.5 SENSITIVE Sensitive     OXACILLIN >=4 RESISTANT Resistant     TETRACYCLINE >=16 RESISTANT Resistant     VANCOMYCIN 1 SENSITIVE Sensitive     TRIMETH/SULFA <=10 SENSITIVE Sensitive     CLINDAMYCIN >=8 RESISTANT Resistant     RIFAMPIN <=0.5 SENSITIVE Sensitive     Inducible Clindamycin NEGATIVE Sensitive     * FEW METHICILLIN RESISTANT STAPHYLOCOCCUS AUREUS  Aerobic/Anaerobic Culture (surgical/deep wound)     Status: None (Preliminary result)   Collection Time: 09/28/19 12:11 PM   Specimen: Abscess  Result Value Ref Range Status   Specimen Description ABSCESS  Final   Special Requests LEFT LEG SPEC A  Final    Gram Stain   Final    MODERATE WBC PRESENT, PREDOMINANTLY PMN FEW GRAM POSITIVE COCCI IN PAIRS IN CLUSTERS Performed at [facility], [address], [place], [place] [address]    Culture   Final    FEW METHICILLIN RESISTANT STAPHYLOCOCCUS AUREUS NO ANAEROBES ISOLATED; CULTURE IN PROGRESS FOR 5 DAYS    Report Status PENDING  Incomplete   Organism ID, Bacteria METHICILLIN RESISTANT STAPHYLOCOCCUS AUREUS  Final      Susceptibility   Methicillin resistant staphylococcus aureus - MIC*    CIPROFLOXACIN >=8 RESISTANT Resistant     ERYTHROMYCIN >=8 RESISTANT Resistant  GENTAMICIN <=0.5 SENSITIVE Sensitive     OXACILLIN >=4 RESISTANT Resistant     TETRACYCLINE >=16 RESISTANT Resistant     VANCOMYCIN 1 SENSITIVE Sensitive     TRIMETH/SULFA <=10 SENSITIVE Sensitive     CLINDAMYCIN >=8 RESISTANT Resistant     RIFAMPIN <=0.5 SENSITIVE Sensitive     Inducible Clindamycin NEGATIVE Sensitive     * FEW METHICILLIN RESISTANT STAPHYLOCOCCUS AUREUS  SARS Coronavirus 2 by RT PCR (hospital order, performed in [place] hospital lab) Nasopharyngeal Nasopharyngeal Swab     Status: None   Collection Time: 09/28/19  2:09 PM   Specimen: Nasopharyngeal Swab  Result Value Ref Range Status   SARS Coronavirus 2 NEGATIVE NEGATIVE Final    Comment: (NOTE) SARS-CoV-2 target nucleic acids are NOT DETECTED.  The SARS-CoV-2 RNA is generally detectable in upper and lower respiratory specimens during the acute phase of infection. The lowest concentration of SARS-CoV-2 viral copies this assay can detect is 250 copies / mL. A negative result does not preclude SARS-CoV-2 infection and should not be used as the sole basis for treatment or other patient management decisions.  A negative result may occur with improper specimen collection / handling, submission of specimen other than nasopharyngeal swab, presence of viral mutation(s) within the areas targeted by this assay, and inadequate number of viral  copies (<250 copies / mL). A negative result must be combined with clinical observations, patient history, and epidemiological information.  Fact Sheet for Patients:   StrictlyIdeas.no  Fact Sheet for Healthcare Providers: BankingDealers.co.za  This test is not yet approved or  cleared by the [place] FDA and has been authorized for detection and/or diagnosis of SARS-CoV-2 by FDA under an Emergency Use Authorization (EUA).  This EUA will remain in effect (meaning this test can be used) for the duration of the COVID-19 declaration under Section 564(b)(1) of the Act, 21 U.S.C. section 360bbb-3(b)(1), unless the authorization is terminated or revoked sooner.  Performed at [facility], [address], [place], [place] [address]      Janene Madeira, MSN, NP-C [email or web address] Pager: [phone] Office: [phone] [place]: [phone]

## 2019-10-01 ENCOUNTER — Inpatient Hospital Stay (HOSPITAL_COMMUNITY): Payer: Medicare Other

## 2019-10-01 LAB — BASIC METABOLIC PANEL
Anion gap: 9 (ref 5–15)
BUN: 10 mg/dL (ref 8–23)
CO2: 30 mmol/L (ref 22–32)
Calcium: 9.5 mg/dL (ref 8.9–10.3)
Chloride: 95 mmol/L — ABNORMAL LOW (ref 98–111)
Creatinine, Ser: 0.61 mg/dL (ref 0.61–1.24)
GFR calc Af Amer: >60 mL/min (ref >60)
GFR calc non Af Amer: >60 mL/min (ref >60)
Glucose, Bld: 114 mg/dL — ABNORMAL HIGH (ref 70–99)
Potassium: 4.5 mmol/L (ref 3.5–5.1)
Sodium: 134 mmol/L — ABNORMAL LOW (ref 135–145)

## 2019-10-01 LAB — CBC
HCT: 36.8 % — ABNORMAL LOW (ref 39.0–52.0)
Hemoglobin: 10.8 g/dL — ABNORMAL LOW (ref 13.0–17.0)
MCH: 24.4 pg — ABNORMAL LOW (ref 26.0–34.0)
MCHC: 29.3 g/dL — ABNORMAL LOW (ref 30.0–36.0)
MCV: 83.3 fL (ref 80.0–100.0)
Platelets: 327 10*3/uL (ref 150–400)
RBC: 4.42 MIL/uL (ref 4.22–5.81)
RDW: 19.5 % — ABNORMAL HIGH (ref 11.5–15.5)
WBC: 8.5 10*3/uL (ref 4.0–10.5)
nRBC: 0 % (ref 0.0–0.2)

## 2019-10-01 LAB — C-REACTIVE PROTEIN: CRP: 6.1 mg/dL — ABNORMAL HIGH (ref <1.0)

## 2019-10-01 LAB — SEDIMENTATION RATE: Sed Rate: 28 mm/hr — ABNORMAL HIGH (ref 0–16)

## 2019-10-01 LAB — VANCOMYCIN, TROUGH: Vancomycin Tr: 5 ug/mL — ABNORMAL LOW (ref 15–20)

## 2019-10-01 MED ORDER — SODIUM CHLORIDE 0.9% FLUSH: 10.0000 mL | INTRAVENOUS | Status: DC | PRN

## 2019-10-01 MED ORDER — VANCOMYCIN HCL 750 MG/150ML IV SOLN
750.0000 mg | Freq: Three times a day (TID) | INTRAVENOUS | Status: DC
  Administered 2019-10-01 – 2019-10-04 (×8): 750 mg via INTRAVENOUS
  Filled 2019-10-01 (×9): qty 150

## 2019-10-01 MED ORDER — SODIUM CHLORIDE 0.9% FLUSH
10.0000 mL | Freq: Two times a day (BID) | INTRAVENOUS | Status: DC
  Administered 2019-10-03 – 2019-10-04 (×2): 10 mL

## 2019-10-01 MED ORDER — CHLORHEXIDINE GLUCONATE CLOTH 2 % EX PADS
6.0000 | MEDICATED_PAD | Freq: Every day | CUTANEOUS | Status: DC
  Administered 2019-10-01 – 2019-10-04 (×4): 6 via TOPICAL

## 2019-10-01 MED ORDER — GUAIFENESIN-DM 100-10 MG/5ML PO SYRP
5.0000 mL | ORAL_SOLUTION | ORAL | Status: DC | PRN
  Administered 2019-10-01: 5 mL via ORAL
  Filled 2019-10-01: qty 10

## 2019-10-01 MED ORDER — IOHEXOL 300 MG/ML  SOLN
100.0000 mL | Freq: Once | INTRAMUSCULAR | Status: AC | PRN
  Administered 2019-10-01: 100 mL via INTRAVENOUS

## 2019-10-01 NOTE — Progress Notes (Signed)
Pharmacy Antibiotic Note  [name] is a 64 y.o. male admitted on 09/28/2019 with osteomyelitis.   Continues on IV vancomycin with LOT of 4 to 6 weeks Vancomycin trough low at  5 this afternoon  Plan: Increase Vancomycin to 750 mg iv Q 8 hours Follow up long-term antibiotic plan  Height: 5\' 7"  (170.2 cm) Weight: 54.4 kg (120 lb) IBW/kg (Calculated) : 66.1  Temp (24hrs), Avg:98.5 F (36.9 C), Min:98.2 F (36.8 C), Max:98.8 F (37.1 C)  Recent Labs  Lab 09/25/19 0855 09/28/19 1205 09/29/19 0138 09/30/19 0031 10/01/19 0732 10/01/19 1300  WBC 8.1 10.3 8.1 7.9 8.5  --   CREATININE 0.65 0.65 0.70 0.62 0.61  --   VANCOTROUGH  --   --   --   --   --  5*    Estimated Creatinine Clearance: 71.8 mL/min (by C-G formula based on SCr of 0.61 mg/dL).    No Known Allergies  Antimicrobials this admission: Vancomycin 8/14 >>  Zosyn 8/14 >> 8/14 Meropenem 8/14 >> 8/15  Microbiology results: 8/14 Wound Cx: MRSA  Thank you  9/14, PharmD  10/01/2019 3:47 PM

## 2019-10-01 NOTE — Progress Notes (Signed)
[facility]  Date of Admission:  09/28/2019      Total days of antibiotics 4  Vancomycin + Rifampin         \  ASSESSMENT: [name] is a 64 y.o. male with concern for recurrent abscess to left leg overlying extensive fusion hardware. Cultures indicate MRSA and on correct therapy with Vancomycin.  He is not interested in home IV antibiotics and thinks it is too cumbersome with other medical equipment. In discussion with his insurance team we need to submit a prior authorization for long acting dalbavancin to use - hopeful we can ensure home administration for this to be given weekly.  Follow CT findings once complete to determine further cares.    PLAN: 1. Follow final cultures form swab (still pending) 2. Will work on home delivered Dalbavancin weekly  3. CT of the femur with contrast to r/o deep abscess  4. 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 Chlorhexidine Gluconate Cloth  6 each Topical Daily  . enoxaparin (LOVENOX) injection  40 mg Subcutaneous Q24H  . fluticasone furoate-vilanterol  1 puff Inhalation Daily  . lactobacillus acidophilus  2 tablet Oral TID  . lisinopril  20 mg Oral Q breakfast  . rifampin  300 mg Oral Q12H  . sodium chloride flush  10-40 mL Intracatheter Q12H    SUBJECTIVE: Feeling OK. Still with leg pain that is unchanged. Thinks overall he feels better though.  Would not want to do home IV antibiotics.    Review of Systems: Review of Systems  Constitutional: Negative for chills, fever and weight loss.  Respiratory: Negative for  cough and shortness of breath.   Cardiovascular: Positive for leg swelling (improved ). Negative for chest pain.  Gastrointestinal: Negative for abdominal pain, diarrhea, nausea and vomiting.  Genitourinary: Negative for dysuria.  Musculoskeletal: Positive for joint pain. Negative for back pain and myalgias.  Skin: Negative for rash.       Anterior leg wrapped with clean dressings.   Neurological: Negative for dizziness and focal weakness.    No Known Allergies  OBJECTIVE: Vitals:   09/30/19 2001 10/01/19 0300 10/01/19 0745 10/01/19 0809  BP: (!) 141/94 140/85 110/74   Pulse: 65 70 65   Resp: 18 18 16    Temp: 98.2 F (36.8 C) 98.5 F (36.9 C) 98.8 F (37.1 C)   TempSrc: Oral Oral Oral   SpO2: 98% 99% 97% 97%  Weight:      Height:       Body mass index is 18.79 kg/m.  Physical Exam Constitutional:      Comments: Sitting upright in recliner. Appears well today.   HENT:     Mouth/Throat:     Mouth: Mucous membranes are moist.     Pharynx: Oropharynx is clear.  Eyes:     General: No scleral icterus. Cardiovascular:     Rate and Rhythm: Normal rate and regular rhythm.     Pulses: Normal pulses.     Heart sounds: No murmur heard.   Pulmonary:     Effort: Pulmonary effort is normal.  Breath sounds: Normal breath sounds.  Abdominal:     General: Abdomen is flat. There is no distension.     Tenderness: There is no abdominal tenderness.  Musculoskeletal:     Comments: Anterior left lower extremity wrapped in clean dressings.  Pain at left knee cap and upper lateral femur.   Skin:    General: Skin is warm and dry.     Capillary Refill: Capillary refill takes less than 2 seconds.  Neurological:     Mental Status: He is alert and oriented to person, place, and time.     Lab Results Lab Results  Component Value Date   WBC 8.5 10/01/2019   HGB 10.8 (L) 10/01/2019   HCT 36.8 (L) 10/01/2019   MCV 83.3 10/01/2019   PLT 327 10/01/2019    Lab Results  Component  Value Date   CREATININE 0.61 10/01/2019   BUN 10 10/01/2019   NA 134 (L) 10/01/2019   K 4.5 10/01/2019   CL 95 (L) 10/01/2019   CO2 30 10/01/2019    Lab Results  Component Value Date   ALT 18 04/13/2019   AST 21 04/13/2019   ALKPHOS 71 04/13/2019   BILITOT 0.1 (L) 04/13/2019     Microbiology: Wound Cx 8/14 >> MRSA (R-clinda, doxy)   Henry Alberts, MSN, NP-C [facility]  [email or web address] Pager: [phone] Office: [phone] [facility] Main Line: [phone]

## 2019-10-01 NOTE — Plan of Care (Signed)

## 2019-10-01 NOTE — Progress Notes (Signed)
Peripherally Inserted Central Catheter Placement  The IV Nurse has discussed with the patient and/or persons authorized to consent for the patient, the purpose of this procedure and the potential benefits and risks involved with this procedure.  The benefits include less needle sticks, lab draws from the catheter, and the patient may be discharged home with the catheter. Risks include, but not limited to, infection, bleeding, blood clot (thrombus formation), and puncture of an artery; nerve damage and irregular heartbeat and possibility to perform a PICC exchange if needed/ordered by physician.  Alternatives to this procedure were also discussed.  Bard Power PICC patient education guide, fact sheet on infection prevention and patient information card has been provided to patient /or left at bedside.    PICC Placement Documentation  PICC Single Lumen 10/01/19 PICC Right Basilic 39 cm 0 cm (Active)  Indication for Insertion or Continuance of Line Home intravenous therapies (PICC only) 10/01/19 1244  Exposed Catheter (cm) 0 cm 10/01/19 1244  Site Assessment Clean;Dry;Intact 10/01/19 1244  Line Status Flushed;Saline locked;Blood return noted 10/01/19 1244  Dressing Type Transparent;Securing device 10/01/19 1244  Dressing Status Clean;Dry;Intact;Antimicrobial disc in place 10/01/19 1244  Safety Lock Not Applicable 10/01/19 1244  Dressing Intervention New dressing 10/01/19 1244  Dressing Change Due 10/08/19 10/01/19 1244       [name] 10/01/2019, 12:45 PM

## 2019-10-01 NOTE — Progress Notes (Signed)
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 Rifampin was added. Further management as per infectious disease.  CT leg for today.  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  Hypertension: Blood pressure stable on lisinopril.  DVT prophylaxis: enoxaparin (LOVENOX) injection 40 mg Start: 09/28/19 1330   Code Status: Full code Family Communication: None Disposition Plan: Status is: Inpatient  Remains inpatient appropriate because:IV treatments appropriate due to intensity of illness or inability to take PO   Dispo: The patient is from: Home              Anticipated d/c is to: Home              Anticipated d/c date is: 2 days              Patient currently is not medically stable to d/c.         Consultants:   Infectious  disease  Orthopedics  Procedures:   None  Antimicrobials:  Antibiotics Given (last 72 hours)    Date/Time Action Medication Dose Rate   09/28/19 1338 New Bag/Given   vancomycin (VANCOCIN) IVPB 1000 mg/200 mL premix 1,000 mg 200 mL/hr   09/28/19 1340 New Bag/Given   piperacillin-tazobactam (ZOSYN) IVPB 3.375 g 3.375 g 12.5 mL/hr   09/28/19 1720 New Bag/Given   meropenem (MERREM) 1 g in sodium chloride 0.9 % 100 mL IVPB 1 g 200 mL/hr   09/28/19 2132 New Bag/Given   meropenem (MERREM) 1 g in sodium chloride 0.9 % 100 mL IVPB 1 g 200 mL/hr   09/29/19 0205 New Bag/Given   vancomycin (VANCOREADY) IVPB 750 mg/150 mL 750 mg 150 mL/hr   09/29/19 0532 New Bag/Given   meropenem (MERREM) 1 g in sodium chloride 0.9 % 100 mL IVPB 1 g 200 mL/hr   09/29/19 1318 New Bag/Given   meropenem (MERREM) 1 g in sodium chloride 0.9 % 100 mL IVPB 1 g 200 mL/hr   09/29/19 1436 New Bag/Given   vancomycin (VANCOREADY) IVPB 750 mg/150 mL 750 mg 150 mL/hr   09/30/19 0207 New Bag/Given   vancomycin (VANCOREADY) IVPB 750 mg/150 mL 750 mg 150 mL/hr   09/30/19 1118 Given   rifampin (RIFADIN) capsule 300 mg 300 mg    09/30/19 1415 New Bag/Given   vancomycin (VANCOREADY) IVPB 750 mg/150 mL 750 mg 150 mL/hr   09/30/19 2203 Given   rifampin (RIFADIN) capsule 300 mg 300 mg    10/01/19 0235 New Bag/Given  vancomycin (VANCOREADY) IVPB 750 mg/150 mL 750 mg 150 mL/hr   10/01/19 0920 Given   rifampin (RIFADIN) capsule 300 mg 300 mg          Subjective: Patient seen and examined.  No overnight events.  He denies any complaints to me.  He states he is getting up and walking around on that knee.  Patient says he will talk to his home health aide whether she can help him take injection antibiotic through the PICC line.  He is wondering about going home and wants to go home as soon as possible.  Objective: Vitals:   09/30/19 2001 10/01/19 0300 10/01/19 0745 10/01/19 0809  BP: (!) 141/94 140/85 110/74   Pulse: 65 70  65   Resp: 18 18 16    Temp: 98.2 F (36.8 C) 98.5 F (36.9 C) 98.8 F (37.1 C)   TempSrc: Oral Oral Oral   SpO2: 98% 99% 97% 97%  Weight:      Height:        Intake/Output Summary (Last 24 hours) at 10/01/2019 1125 Last data filed at 10/01/2019 0900 Gross per 24 hour  Intake 480 ml  Output 1920 ml  Net -1440 ml   Filed Weights   09/28/19 0134  Weight: 54.4 kg    Examination:  General exam: Appears calm and comfortable  On 2 L oxygen.  Not in any distress. Respiratory system: Clear to auscultation. Respiratory effort normal. Cardiovascular system: S1 & S2 heard, RRR.  Gastrointestinal system: Abdomen is nondistended, soft and nontender. No organomegaly or masses felt. Normal bowel sounds heard. Central nervous system: Alert and oriented. No focal neurological deficits. Extremities:  Left knee, stiff, minimal abscess drain is present on dressing, dressing intact.  Distal neurovascular status intact.   Data Reviewed: I have personally reviewed following labs and imaging studies  CBC: Recent Labs  Lab 09/25/19 0855 09/28/19 1205 09/29/19 0138 09/30/19 0031 10/01/19 0732  WBC 8.1 10.3 8.1 7.9 8.5  NEUTROABS  --  8.1*  --   --   --   HGB 10.8* 10.4* 10.4* 10.0* 10.8*  HCT 36.5* 35.2* 36.1* 34.3* 36.8*  MCV 83.0 83.0 83.2 83.3 83.3  PLT 270 306 296 317 327   Basic Metabolic Panel: Recent Labs  Lab 09/25/19 0855 09/28/19 1205 09/29/19 0138 09/30/19 0031 10/01/19 0732  NA 136 134* 134* 133* 134*  K 4.4 4.4 4.6 4.4 4.5  CL 97* 96* 96* 94* 95*  CO2 29 28 30 31 30   GLUCOSE 106* 108* 101* 104* 114*  BUN 8 8 10  6* 10  CREATININE 0.65 0.65 0.70 0.62 0.61  CALCIUM 9.6 9.7 9.4 9.4 9.5   GFR: Estimated Creatinine Clearance: 71.8 mL/min (by C-G formula based on SCr of 0.61 mg/dL). Liver Function Tests: No results for input(s): AST, ALT, ALKPHOS, BILITOT, PROT, ALBUMIN in the last 168 hours. No results for input(s): LIPASE, AMYLASE in the last 168 hours. No  results for input(s): AMMONIA in the last 168 hours. Coagulation Profile: No results for input(s): INR, PROTIME in the last 168 hours. Cardiac Enzymes: No results for input(s): CKTOTAL, CKMB, CKMBINDEX, TROPONINI in the last 168 hours. BNP (last 3 results) No results for input(s): PROBNP in the last 8760 hours. HbA1C: No results for input(s): HGBA1C in the last 72 hours. CBG: No results for input(s): GLUCAP in the last 168 hours. Lipid Profile: No results for input(s): CHOL, HDL, LDLCALC, TRIG, CHOLHDL, LDLDIRECT in the last 72 hours. Thyroid Function Tests: No results for input(s):  TSH, T4TOTAL, FREET4, T3FREE, THYROIDAB in the last 72 hours. Anemia Panel: No results for input(s): VITAMINB12, FOLATE, FERRITIN, TIBC, IRON, RETICCTPCT in the last 72 hours. Sepsis Labs: No results for input(s): PROCALCITON, LATICACIDVEN in the last 168 hours.  Recent Results (from the past 240 hour(s))  Aerobic/Anaerobic Culture (surgical/deep wound)     Status: None (Preliminary result)   Collection Time: 09/28/19 12:05 PM   Specimen: Abscess; Wound  Result Value Ref Range Status   Specimen Description ABSCESS  Final   Special Requests LEFT LEG SPEC B  Final   Gram Stain   Final    FEW WBC PRESENT, PREDOMINANTLY PMN RARE GRAM POSITIVE COCCI IN CLUSTERS Performed at [facility], [address] [place], [place] [address]    Culture   Final    FEW METHICILLIN RESISTANT STAPHYLOCOCCUS AUREUS NO ANAEROBES ISOLATED; CULTURE IN PROGRESS FOR 5 DAYS    Report Status PENDING  Incomplete   Organism ID, Bacteria METHICILLIN RESISTANT STAPHYLOCOCCUS AUREUS  Final      Susceptibility   Methicillin resistant staphylococcus aureus - MIC*    CIPROFLOXACIN >=8 RESISTANT Resistant     ERYTHROMYCIN >=8 RESISTANT Resistant     GENTAMICIN <=0.5 SENSITIVE Sensitive     OXACILLIN >=4 RESISTANT Resistant     TETRACYCLINE >=16 RESISTANT Resistant     VANCOMYCIN 1 SENSITIVE Sensitive     TRIMETH/SULFA <=10  SENSITIVE Sensitive     CLINDAMYCIN >=8 RESISTANT Resistant     RIFAMPIN <=0.5 SENSITIVE Sensitive     Inducible Clindamycin NEGATIVE Sensitive     * FEW METHICILLIN RESISTANT STAPHYLOCOCCUS AUREUS  Aerobic/Anaerobic Culture (surgical/deep wound)     Status: None (Preliminary result)   Collection Time: 09/28/19 12:11 PM   Specimen: Abscess  Result Value Ref Range Status   Specimen Description ABSCESS  Final   Special Requests LEFT LEG SPEC A  Final   Gram Stain   Final    MODERATE WBC PRESENT, PREDOMINANTLY PMN FEW GRAM POSITIVE COCCI IN PAIRS IN CLUSTERS Performed at [facility], [address] [place], [place] [address]    Culture   Final    FEW METHICILLIN RESISTANT STAPHYLOCOCCUS AUREUS NO ANAEROBES ISOLATED; CULTURE IN PROGRESS FOR 5 DAYS    Report Status PENDING  Incomplete   Organism ID, Bacteria METHICILLIN RESISTANT STAPHYLOCOCCUS AUREUS  Final      Susceptibility   Methicillin resistant staphylococcus aureus - MIC*    CIPROFLOXACIN >=8 RESISTANT Resistant     ERYTHROMYCIN >=8 RESISTANT Resistant     GENTAMICIN <=0.5 SENSITIVE Sensitive     OXACILLIN >=4 RESISTANT Resistant     TETRACYCLINE >=16 RESISTANT Resistant     VANCOMYCIN 1 SENSITIVE Sensitive     TRIMETH/SULFA <=10 SENSITIVE Sensitive     CLINDAMYCIN >=8 RESISTANT Resistant     RIFAMPIN <=0.5 SENSITIVE Sensitive     Inducible Clindamycin NEGATIVE Sensitive     * FEW METHICILLIN RESISTANT STAPHYLOCOCCUS AUREUS  SARS Coronavirus 2 by RT PCR (hospital order, performed in [facility] hospital lab) Nasopharyngeal Nasopharyngeal Swab     Status: None   Collection Time: 09/28/19  2:09 PM   Specimen: Nasopharyngeal Swab  Result Value Ref Range Status   SARS Coronavirus 2 NEGATIVE NEGATIVE Final    Comment: (NOTE) SARS-CoV-2 target nucleic acids are NOT DETECTED.  The SARS-CoV-2 RNA is generally detectable in upper and lower respiratory specimens during the acute phase of infection. The  lowest concentration of SARS-CoV-2 viral copies this assay can detect  is 250 copies / mL. A negative result does not preclude SARS-CoV-2 infection and should not be used as the sole basis for treatment or other patient management decisions.  A negative result may occur with improper specimen collection / handling, submission of specimen other than nasopharyngeal swab, presence of viral mutation(s) within the areas targeted by this assay, and inadequate number of viral copies (<250 copies / mL). A negative result must be combined with clinical observations, patient history, and epidemiological information.  Fact Sheet for Patients:   BoilerBrush.com.cy  Fact Sheet for Healthcare Providers: https://pope.com/  This test is not yet approved or  cleared by the [place] FDA and has been authorized for detection and/or diagnosis of SARS-CoV-2 by FDA under an Emergency Use Authorization (EUA).  This EUA will remain in effect (meaning this test can be used) for the duration of the COVID-19 declaration under Section 564(b)(1) of the Act, 21 U.S.C. section 360bbb-3(b)(1), unless the authorization is terminated or revoked sooner.  Performed at [facility], [address] [place], [place] [address]          Radiology Studies: [place] EKG SITE RITE  Result Date: 09/30/2019 If Site Rite image not attached, placement could not be confirmed due to current cardiac rhythm.       Scheduled Meds: . enoxaparin (LOVENOX) injection  40 mg Subcutaneous Q24H  . fluticasone furoate-vilanterol  1 puff Inhalation Daily  . lactobacillus acidophilus  2 tablet Oral TID  . lisinopril  20 mg Oral Q breakfast  . rifampin  300 mg Oral Q12H   Continuous Infusions: . vancomycin 750 mg (10/01/19 0235)     LOS: 3 days    Time spent: 25 minutes    Dorcas Carrow, MD [facility] Pager [phone]

## 2019-10-02 LAB — CBC
HCT: 36.3 % — ABNORMAL LOW (ref 39.0–52.0)
Hemoglobin: 10.9 g/dL — ABNORMAL LOW (ref 13.0–17.0)
MCH: 24.7 pg — ABNORMAL LOW (ref 26.0–34.0)
MCHC: 30 g/dL (ref 30.0–36.0)
MCV: 82.3 fL (ref 80.0–100.0)
Platelets: 322 10*3/uL (ref 150–400)
RBC: 4.41 MIL/uL (ref 4.22–5.81)
RDW: 19.7 % — ABNORMAL HIGH (ref 11.5–15.5)
WBC: 7.8 10*3/uL (ref 4.0–10.5)
nRBC: 0 % (ref 0.0–0.2)

## 2019-10-02 LAB — BASIC METABOLIC PANEL
Anion gap: 8 (ref 5–15)
BUN: 8 mg/dL (ref 8–23)
CO2: 30 mmol/L (ref 22–32)
Calcium: 9.4 mg/dL (ref 8.9–10.3)
Chloride: 94 mmol/L — ABNORMAL LOW (ref 98–111)
Creatinine, Ser: 0.65 mg/dL (ref 0.61–1.24)
GFR calc Af Amer: >60 mL/min (ref >60)
GFR calc non Af Amer: >60 mL/min (ref >60)
Glucose, Bld: 113 mg/dL — ABNORMAL HIGH (ref 70–99)
Potassium: 4.3 mmol/L (ref 3.5–5.1)
Sodium: 132 mmol/L — ABNORMAL LOW (ref 135–145)

## 2019-10-02 NOTE — Progress Notes (Signed)
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  Orthopedic and infectious disease were consulted.  Initially patient was on IV vancomycin, meropenem and Zosyn thereafter wound cultures grew MRSA therefore transition to vancomycin alone.  PICC line placed on 10/01/2019.   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 Eulah Pont with Dr. Lajoyce Corners will be seeing the patient.  History of COPD with chronic hypoxia on 2 L nasal cannula -As needed bronchodilators.  Remains on 2 L nasal cannula  Essential hypertension -Continue 20 mg of daily lisinopril   DVT prophylaxis: Lovenox Code Status: Full code Family Communication: Patient tells me he has none  Status is: Inpatient  Remains inpatient appropriate because:IV treatments appropriate due to intensity of illness or inability to take PO   Dispo: The patient is from: Home              Anticipated d/c is to: Home              Anticipated d/c date is: 2  days              Patient currently is not medically stable to d/c.  Maintain hospital stay for IV antibiotics until further long-term plan is determined for his infected knee   Body mass index is 18.79 kg/m.     Subjective: Patient states he feels okay besides pain in the left knee upon movement.  He has been out of bed to chair  Review of Systems Otherwise negative except as per HPI, including: General: Denies fever, chills, night sweats or unintended weight loss. Resp: Denies cough, wheezing, shortness of breath. Cardiac: Denies chest pain, palpitations, orthopnea, paroxysmal nocturnal dyspnea. GI: Denies abdominal pain, nausea, vomiting, diarrhea or constipation GU: Denies dysuria, frequency, hesitancy or incontinence MS: Denies muscle aches, joint pain or swelling Neuro: Denies headache, neurologic deficits (focal weakness, numbness, tingling), abnormal gait Psych: Denies anxiety, depression, SI/HI/AVH Skin: Denies new rashes or lesions ID: Denies sick contacts, exotic exposures, travel  Examination:  General exam: Appears calm and comfortable  Respiratory system: Clear to auscultation. Respiratory effort normal. Cardiovascular system: S1 & S2 heard, RRR. No JVD, murmurs, rubs, gallops or clicks. No pedal edema. Gastrointestinal system: Abdomen is nondistended, soft and nontender. No organomegaly or masses felt. Normal bowel sounds heard. Central nervous system: Alert and oriented. No focal neurological deficits. Extremities: Limited range of motion of his left knee Skin: Left knee dressing noted without any evidence of active bleeding or erythema Psychiatry: Judgement and insight appear normal. Mood & affect appropriate.     Objective: Vitals:   10/01/19 1427 10/01/19  1959 10/02/19 0433 10/02/19 0751  BP: 113/76 128/83 123/75 140/75  Pulse: 83 81 72 68  Resp: Temp: 98.4 F (36.9 C) 99 F (37.2 C) 98.8 F (37.1 C) 98.7 F (37.1 C)  TempSrc: Oral Oral  Oral   SpO2: 97% 97% 94% 96%  Weight:      Height:        Intake/Output Summary (Last 24 hours) at 10/02/2019 1610 Last data filed at 10/02/2019 0300 Gross per 24 hour  Intake 510 ml  Output 950 ml  Net -440 ml   Filed Weights   09/28/19 0134  Weight: 54.4 kg     Data Reviewed:   CBC: Recent Labs  Lab 09/28/19 1205 09/29/19 0138 09/30/19 0031 10/01/19 0732 10/02/19 0341  WBC 10.3 8.1 7.9 8.5 7.8  NEUTROABS 8.1*  --   --   --   --   HGB 10.4* 10.4* 10.0* 10.8* 10.9*  HCT 35.2* 36.1* 34.3* 36.8* 36.3*  MCV 83.0 83.2 83.3 83.3 82.3  PLT 306 296 317 327 322   Basic Metabolic Panel: Recent Labs  Lab 09/28/19 1205 09/29/19 0138 09/30/19 0031 10/01/19 0732 10/02/19 0341  NA 134* 134* 133* 134* 132*  K 4.4 4.6 4.4 4.5 4.3  CL 96* 96* 94* 95* 94*  CO2 GLUCOSE 108* 101* 104* 114* 113*  BUN 8 10 6* 10 8  CREATININE 0.65 0.70 0.62 0.61 0.65  CALCIUM 9.7 9.4 9.4 9.5 9.4   GFR: Estimated Creatinine Clearance: 71.8 mL/min (by C-G formula based on SCr of 0.65 mg/dL). Liver Function Tests: No results for input(s): AST, ALT, ALKPHOS, BILITOT, PROT, ALBUMIN in the last 168 hours. No results for input(s): LIPASE, AMYLASE in the last 168 hours. No results for input(s): AMMONIA in the last 168 hours. Coagulation Profile: No results for input(s): INR, PROTIME in the last 168 hours. Cardiac Enzymes: No results for input(s): CKTOTAL, CKMB, CKMBINDEX, TROPONINI in the last 168 hours. BNP (last 3 results) No results for input(s): PROBNP in the last 8760 hours. HbA1C: No results for input(s): HGBA1C in the last 72 hours. CBG: No results for input(s): GLUCAP in the last 168 hours. Lipid Profile: No results for input(s): CHOL, HDL, LDLCALC, TRIG, CHOLHDL, LDLDIRECT in the last 72 hours. Thyroid Function Tests: No results for input(s): TSH, T4TOTAL, FREET4, T3FREE, THYROIDAB in the last 72 hours. Anemia Panel: No results for input(s): VITAMINB12, FOLATE,  FERRITIN, TIBC, IRON, RETICCTPCT in the last 72 hours. Sepsis Labs: No results for input(s): PROCALCITON, LATICACIDVEN in the last 168 hours.  Recent Results (from the past 240 hour(s))  Aerobic/Anaerobic Culture (surgical/deep wound)     Status: None (Preliminary result)   Collection Time: 09/28/19 12:05 PM   Specimen: Abscess; Wound  Result Value Ref Range Status   Specimen Description ABSCESS  Final   Special Requests LEFT LEG SPEC B  Final   Gram Stain   Final    FEW WBC PRESENT, PREDOMINANTLY PMN RARE GRAM POSITIVE COCCI IN CLUSTERS Performed at [facility], [address], [place], [place] [address]    Culture   Final    FEW METHICILLIN RESISTANT STAPHYLOCOCCUS AUREUS NO ANAEROBES ISOLATED; CULTURE IN PROGRESS FOR 5 DAYS    Report Status PENDING  Incomplete   Organism ID, Bacteria METHICILLIN RESISTANT STAPHYLOCOCCUS AUREUS  Final      Susceptibility   Methicillin resistant staphylococcus aureus - MIC*    CIPROFLOXACIN >=8 RESISTANT Resistant  ERYTHROMYCIN >=8 RESISTANT Resistant     GENTAMICIN <=0.5 SENSITIVE Sensitive     OXACILLIN >=4 RESISTANT Resistant     TETRACYCLINE >=16 RESISTANT Resistant     VANCOMYCIN 1 SENSITIVE Sensitive     TRIMETH/SULFA <=10 SENSITIVE Sensitive     CLINDAMYCIN >=8 RESISTANT Resistant     RIFAMPIN <=0.5 SENSITIVE Sensitive     Inducible Clindamycin NEGATIVE Sensitive     * FEW METHICILLIN RESISTANT STAPHYLOCOCCUS AUREUS  Aerobic/Anaerobic Culture (surgical/deep wound)     Status: None (Preliminary result)   Collection Time: 09/28/19 12:11 PM   Specimen: Abscess  Result Value Ref Range Status   Specimen Description ABSCESS  Final   Special Requests LEFT LEG SPEC A  Final   Gram Stain   Final    MODERATE WBC PRESENT, PREDOMINANTLY PMN FEW GRAM POSITIVE COCCI IN PAIRS IN CLUSTERS Performed at [facility], [address] [place], [place] [address]    Culture   Final    FEW METHICILLIN RESISTANT STAPHYLOCOCCUS  AUREUS NO ANAEROBES ISOLATED; CULTURE IN PROGRESS FOR 5 DAYS    Report Status PENDING  Incomplete   Organism ID, Bacteria METHICILLIN RESISTANT STAPHYLOCOCCUS AUREUS  Final      Susceptibility   Methicillin resistant staphylococcus aureus - MIC*    CIPROFLOXACIN >=8 RESISTANT Resistant     ERYTHROMYCIN >=8 RESISTANT Resistant     GENTAMICIN <=0.5 SENSITIVE Sensitive     OXACILLIN >=4 RESISTANT Resistant     TETRACYCLINE >=16 RESISTANT Resistant     VANCOMYCIN 1 SENSITIVE Sensitive     TRIMETH/SULFA <=10 SENSITIVE Sensitive     CLINDAMYCIN >=8 RESISTANT Resistant     RIFAMPIN <=0.5 SENSITIVE Sensitive     Inducible Clindamycin NEGATIVE Sensitive     * FEW METHICILLIN RESISTANT STAPHYLOCOCCUS AUREUS  SARS Coronavirus 2 by RT PCR (hospital order, performed in [facility] hospital lab) Nasopharyngeal Nasopharyngeal Swab     Status: None   Collection Time: 09/28/19  2:09 PM   Specimen: Nasopharyngeal Swab  Result Value Ref Range Status   SARS Coronavirus 2 NEGATIVE NEGATIVE Final    Comment: (NOTE) SARS-CoV-2 target nucleic acids are NOT DETECTED.  The SARS-CoV-2 RNA is generally detectable in upper and lower respiratory specimens during the acute phase of infection. The lowest concentration of SARS-CoV-2 viral copies this assay can detect is 250 copies / mL. A negative result does not preclude SARS-CoV-2 infection and should not be used as the sole basis for treatment or other patient management decisions.  A negative result may occur with improper specimen collection / handling, submission of specimen other than nasopharyngeal swab, presence of viral mutation(s) within the areas targeted by this assay, and inadequate number of viral copies (<250 copies / mL). A negative result must be combined with clinical observations, patient history, and epidemiological information.  Fact Sheet for Patients:   BoilerBrush.com.cyhttps://www.fda.gov/media/136312/download  Fact Sheet for Healthcare  Providers: https://pope.com/https://www.fda.gov/media/136313/download  This test is not yet approved or  cleared by the [place] FDA and has been authorized for detection and/or diagnosis of SARS-CoV-2 by FDA under an Emergency Use Authorization (EUA).  This EUA will remain in effect (meaning this test can be used) for the duration of the COVID-19 declaration under Section 564(b)(1) of the Act, 21 U.S.C. section 360bbb-3(b)(1), unless the authorization is terminated or revoked sooner.  Performed at [facility], [address] [place], [place] [address]          Radiology Studies: CT FEMUR LEFT W  CONTRAST  Result Date: 10/01/2019 CLINICAL DATA:  Osteomyelitis of the femur EXAM: CT OF THE LOWER RIGHT EXTREMITY WITH CONTRAST TECHNIQUE: Multidetector CT imaging of the lower right extremity was performed according to the standard protocol following intravenous contrast administration. COMPARISON:  Radiographs from 09/25/2019 CONTRAST:  OMNIPAQUE IOHEXOL 300 MG/ML  SOLN FINDINGS: Bones/Joint/Cartilage Cemented intramedullary knee arthrodesis nail system noted with spacer and locking bolts in place. Along the femoral component, there is thick periosteal reaction especially laterally for example on image 165/3. There is also some lucency tracking between the seem in surrounding the femoral nail and the adjacent and ostium, and on image 172/3 there appears to be a channel through the cortex posterolaterally extending towards hazy complex density in the soft tissues, this could represent a drainage site although I do not see a well-defined extension to the skin. On image 176/3 there is a similar channel through the bone attaching to the lucency between the C mint and the and ostium, other potential channels include anteriorly on image 192/3. Along the distal bony margin of the femur, there is a fluid collection surrounding the proximal spacer and bolt system, and distending the soft tissues for  example anteriorly on image 231/3. Although postoperative fluid collection might certainly be a consideration, abscess is not excluded. The fluid collection along the anterolateral margin of the spacer has superficial marginally about 4 mm deep to the skin surface. There some small bony fragments anteriorly. Patella is absent. Bridging bony fusion between the proximal tibia and fibula. As in the femur, along the cemented tibial nail, there is some lucency tracking between the cement and the sclerotic edge of the medullary space especially proximally as shown for example on image 257/3. There are some channels extending through the bone for example anteriorly in the tibia on image 265/3 and on image 277/3 where there is a defect both of the bone and the seem band. A smaller anterior channel as shown on image 290/3. There is chronic periosteal reaction in this portion of the tibia for example as shown on image 308/3. There is some ossification along the interosseous membrane on the left shown on bottom most images such image 356/3. There is some similar findings on the right along with deformity of the right tibia as shown on image 357/3, only partially included on today's exam. There is also a right knee effusion and right knee osteoarthritis. Ligaments Suboptimally assessed by CT. Muscles and Tendons There is some mild atrophy of the gastrocnemius musculature. Soft tissues Subcutaneous edema especially anteriorly at the level of the arthrodesis spacer, cellulitis not excluded. Aside from the large fluid collection around the spacer a drainable fluid collection is not observed. IMPRESSION: 1. Cemented intramedullary knee arthrodesis nail system. High likelihood of chronic infection giving the lucency extending between the segment and the adjacent bone with several channels tracking through the bone both along the distal femoral bone and in the proximal tibia, with associated chronic periostitis. Moreover, surrounding  the spacer but especially anteriorly, there is a notable fluid collection measuring up to about 2.9 cm in anterior-posterior dimension, with superficial margin only about 4 mm deep to the skin surface, which could represent infected fluid. Electronically Signed   By: Gaylyn Rong M.D.   On: 10/01/2019 20:32   [place] EKG SITE RITE  Result Date: 09/30/2019 If Site Rite image not attached, placement could not be confirmed due to current cardiac rhythm.       Scheduled Meds: . Chlorhexidine Gluconate Cloth  6 each Topical Daily  . enoxaparin (LOVENOX) injection  40 mg Subcutaneous Q24H  . fluticasone furoate-vilanterol  1 puff Inhalation Daily  . lactobacillus acidophilus  2 tablet Oral TID  . lisinopril  20 mg Oral Q breakfast  . rifampin  300 mg Oral Q12H  . sodium chloride flush  10-40 mL Intracatheter Q12H   Continuous Infusions: . vancomycin 750 mg (10/02/19 0637)     LOS: 4 days   Time spent= 35 mins    Moriah Shawley Joline Maxcy, MD [facility]  If 7PM-7AM, please contact night-coverage  10/02/2019, 8:22 AM

## 2019-10-02 NOTE — Plan of Care (Signed)
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
## 2019-10-02 NOTE — Progress Notes (Signed)
[facility]  Date of Admission:  09/28/2019      Total days of antibiotics 5  Vancomycin + Rifampin         \  ASSESSMENT: [name] is a 64 y.o. male with concern for recurrent abscess to left leg overlying extensive fusion hardware. Superficial cultures from anterior tibia indicate MRSA. CT scan indicates a high degree of concern for chronic infection with lucency extending between the segment of the adjacent bones with possible areas of tracking. Surrounding the spacer anteriorly there is a notable fluid collection measuring 2.9 cm anteriorly.   Will ask Dr. Eulah Pont / ortho to review CT scan for any surgery management. If he has deeper infection would need to sample this given his history of other organisms causing PJI previously.   Planning on IV dalbavancin outpatient at home pending further surgery recs.     PLAN: 1. Need to see what ortho's thoughts are re: CT with likely higher up chronic infection  2. For now continue vancomycin alone.     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 Active Problems:   Chronic obstructive airway disease with asthma (HCC)   Essential hypertension   Rupture of left patellar tendon, open, post-total knee replacement   Prosthetic joint infection (HCC)   Nicotine dependence, cigarettes, uncomplicated    Chlorhexidine Gluconate Cloth  6 each Topical Daily   enoxaparin (LOVENOX) injection  40 mg Subcutaneous Q24H   fluticasone furoate-vilanterol  1 puff Inhalation Daily   lactobacillus acidophilus  2 tablet Oral TID   lisinopril  20 mg Oral Q breakfast   rifampin  300 mg Oral Q12H   sodium chloride flush  10-40 mL Intracatheter Q12H    SUBJECTIVE: Feeling OK.   Review of Systems: Review of Systems  Constitutional: Negative for chills, fever and weight loss.  Respiratory: Negative for cough and shortness of  breath.   Cardiovascular: Positive for leg swelling (improved ). Negative for chest pain.  Gastrointestinal: Negative for abdominal pain, diarrhea, nausea and vomiting.  Genitourinary: Negative for dysuria.  Musculoskeletal: Positive for joint pain. Negative for back pain and myalgias.  Skin: Negative for rash.       Anterior leg wrapped with clean dressings.   Neurological: Negative for dizziness and focal weakness.    No Known Allergies  OBJECTIVE: Vitals:   10/01/19 1427 10/01/19 1959 10/02/19 0433 10/02/19 0751  BP: 113/76 128/83 123/75 140/75  Pulse: 83 81 72 68  Resp: 17 18 16 16   Temp: 98.4 F (36.9 C) 99 F (37.2 C) 98.8 F (37.1 C) 98.7 F (37.1 C)  TempSrc: Oral Oral Oral   SpO2: 97% 97% 94% 96%  Weight:      Height:       Body mass index is 18.79 kg/m.  Physical Exam Constitutional:      Comments: Sitting upright in recliner. Appears well today.   HENT:     Mouth/Throat:     Mouth: Mucous membranes are moist.     Pharynx: Oropharynx is clear.  Eyes:     General: No scleral icterus. Cardiovascular:     Rate and Rhythm: Normal rate and regular rhythm.     Pulses: Normal pulses.     Heart sounds: No murmur heard.   Pulmonary:     Effort: Pulmonary effort is normal.     Breath sounds: Normal breath sounds.  Abdominal:     General: Abdomen is flat. There is no distension.     Tenderness: There is no abdominal tenderness.  Musculoskeletal:     Comments: Anterior left lower extremity wrapped in clean dressings.  Pain at left knee cap and upper lateral femur.   Skin:    General: Skin is warm and dry.     Capillary Refill: Capillary refill takes less than 2 seconds.  Neurological:     Mental Status: He is alert and oriented to person, place, and time.     Lab Results Lab Results  Component Value Date   WBC 7.8 10/02/2019   HGB 10.9 (L) 10/02/2019   HCT 36.3 (L) 10/02/2019   MCV 82.3 10/02/2019   PLT 322 10/02/2019    Lab Results  Component  Value Date   CREATININE 0.65 10/02/2019   BUN 8 10/02/2019   NA 132 (L) 10/02/2019   K 4.3 10/02/2019   CL 94 (L) 10/02/2019   CO2 30 10/02/2019    Lab Results  Component Value Date   ALT 18 04/13/2019   AST 21 04/13/2019   ALKPHOS 71 04/13/2019   BILITOT 0.1 (L) 04/13/2019     Microbiology: Wound Cx 8/14 >> MRSA (R-clinda, doxy)   [name], MSN, NP-C [facility]  [email or web address] Pager: [phone] Office: [phone] [facility] Main Line: [phone]

## 2019-10-03 LAB — BASIC METABOLIC PANEL
Anion gap: 9 (ref 5–15)
BUN: 7 mg/dL — ABNORMAL LOW (ref 8–23)
CO2: 29 mmol/L (ref 22–32)
Calcium: 9.3 mg/dL (ref 8.9–10.3)
Chloride: 96 mmol/L — ABNORMAL LOW (ref 98–111)
Creatinine, Ser: 0.63 mg/dL (ref 0.61–1.24)
GFR calc Af Amer: >60 mL/min (ref >60)
GFR calc non Af Amer: >60 mL/min (ref >60)
Glucose, Bld: 163 mg/dL — ABNORMAL HIGH (ref 70–99)
Potassium: 3.9 mmol/L (ref 3.5–5.1)
Sodium: 134 mmol/L — ABNORMAL LOW (ref 135–145)

## 2019-10-03 LAB — AEROBIC/ANAEROBIC CULTURE W GRAM STAIN (SURGICAL/DEEP WOUND)

## 2019-10-03 LAB — CBC
HCT: 33.1 % — ABNORMAL LOW (ref 39.0–52.0)
Hemoglobin: 9.8 g/dL — ABNORMAL LOW (ref 13.0–17.0)
MCH: 25 pg — ABNORMAL LOW (ref 26.0–34.0)
MCHC: 29.6 g/dL — ABNORMAL LOW (ref 30.0–36.0)
MCV: 84.4 fL (ref 80.0–100.0)
Platelets: 326 10*3/uL (ref 150–400)
RBC: 3.92 MIL/uL — ABNORMAL LOW (ref 4.22–5.81)
RDW: 19.9 % — ABNORMAL HIGH (ref 11.5–15.5)
WBC: 6.3 10*3/uL (ref 4.0–10.5)
nRBC: 0 % (ref 0.0–0.2)

## 2019-10-03 NOTE — Progress Notes (Signed)
I have reviewed all images and discussed case with Dr. Lajoyce Corners. He is planning to consult as well as this is a very complex patient with a difficult problem.    Sheral Apley

## 2019-10-03 NOTE — Progress Notes (Signed)
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  Orthopedic and infectious disease were consulted.  Initially patient was on IV vancomycin, meropenem and Zosyn thereafter wound cultures grew MRSA therefore transition to vancomycin alone.  PICC line placed on 10/01/2019.  Ortho is currently following the patient regarding complicated left knee status.   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 Lajoyce Corners to consult  History of COPD with chronic hypoxia on 2 L nasal cannula -As needed bronchodilators.  Remains on 2 L nasal cannula  Essential hypertension -Lisinopril 20 mg daily   DVT prophylaxis: Lovenox Code Status: Full code Family Communication: Patient tells me he has none  Status is: Inpatient  Remains inpatient appropriate because:IV treatments appropriate due to intensity of illness or inability to take PO   Dispo: The patient is from: Home              Anticipated d/c is  to: Home              Anticipated d/c date is: 2 days              Patient currently is not medically stable to d/c.  Complicated left knee infection getting IV antibiotics.  Awaiting plan from orthopedic.   Body mass index is 18.79 kg/m.     Subjective: He is seen and examined at bedside this morning, he is in good spirits and does not have any new complaints  Review of Systems Otherwise negative except as per HPI, including: General: Denies fever, chills, night sweats or unintended weight loss. Resp: Denies cough, wheezing, shortness of breath. Cardiac: Denies chest pain, palpitations, orthopnea, paroxysmal nocturnal dyspnea. GI: Denies abdominal pain, nausea, vomiting, diarrhea or constipation GU: Denies dysuria, frequency, hesitancy or incontinence MS: Denies muscle aches, joint pain or swelling Neuro: Denies headache, neurologic deficits (focal weakness, numbness, tingling), abnormal gait Psych: Denies anxiety, depression, SI/HI/AVH Skin: Denies new rashes or lesions ID: Denies sick contacts, exotic exposures, travel  Examination: Constitutional: Not in acute distress Respiratory: Clear to auscultation bilaterally Cardiovascular: Normal sinus rhythm, no rubs Abdomen: Nontender nondistended good bowel sounds Musculoskeletal: No edema noted.  Limited range of motion of left lower extremity Skin: Left knee lower extremity dressing noted Neurologic: CN 2-12 grossly intact.  And nonfocal Psychiatric: Normal judgment and insight. Alert and oriented x 3. Normal mood.  Objective: Vitals:   10/02/19 0751 10/02/19 1321 10/02/19 2125 10/03/19 0507  BP: 140/75 130/77 118/63 123/64  Pulse: 68 70 76 63  Resp: Temp: 98.7 F (37.1 C) 98.3 F (36.8 C)  98.4 F (36.9 C) 98 F (36.7 C)  TempSrc:  Oral Oral Oral  SpO2: 96% 96% 100% 100%  Weight:      Height:        Intake/Output Summary (Last 24 hours) at 10/03/2019 0739 Last data filed at 10/02/2019 2128 Gross per  24 hour  Intake 720 ml  Output 1325 ml  Net -605 ml   Filed Weights   09/28/19 0134  Weight: 54.4 kg     Data Reviewed:   CBC: Recent Labs  Lab 09/28/19 1205 09/28/19 1205 09/29/19 0138 09/30/19 0031 10/01/19 0732 10/02/19 0341 10/03/19 0435  WBC 10.3   < > 8.1 7.9 8.5 7.8 6.3  NEUTROABS 8.1*  --   --   --   --   --   --   HGB 10.4*   < > 10.4* 10.0* 10.8* 10.9* 9.8*  HCT 35.2*   < > 36.1* 34.3* 36.8* 36.3* 33.1*  MCV 83.0   < > 83.2 83.3 83.3 82.3 84.4  PLT 306   < > 296 317 327 322 326   < > = values in this interval not displayed.   Basic Metabolic Panel: Recent Labs  Lab 09/29/19 0138 09/30/19 0031 10/01/19 0732 10/02/19 0341 10/03/19 0435  NA 134* 133* 134* 132* 134*  K 4.6 4.4 4.5 4.3 3.9  CL 96* 94* 95* 94* 96*  CO2 30 31 30 30 29   GLUCOSE 101* 104* 114* 113* 163*  BUN 10 6* 10 8 7*  CREATININE 0.70 0.62 0.61 0.65 0.63  CALCIUM 9.4 9.4 9.5 9.4 9.3   GFR: Estimated Creatinine Clearance: 71.8 mL/min (by C-G formula based on SCr of 0.63 mg/dL). Liver Function Tests: No results for input(s): AST, ALT, ALKPHOS, BILITOT, PROT, ALBUMIN in the last 168 hours. No results for input(s): LIPASE, AMYLASE in the last 168 hours. No results for input(s): AMMONIA in the last 168 hours. Coagulation Profile: No results for input(s): INR, PROTIME in the last 168 hours. Cardiac Enzymes: No results for input(s): CKTOTAL, CKMB, CKMBINDEX, TROPONINI in the last 168 hours. BNP (last 3 results) No results for input(s): PROBNP in the last 8760 hours. HbA1C: No results for input(s): HGBA1C in the last 72 hours. CBG: No results for input(s): GLUCAP in the last 168 hours. Lipid Profile: No results for input(s): CHOL, HDL, LDLCALC, TRIG, CHOLHDL, LDLDIRECT in the last 72 hours. Thyroid Function Tests: No results for input(s): TSH, T4TOTAL, FREET4, T3FREE, THYROIDAB in the last 72 hours. Anemia Panel: No results for input(s): VITAMINB12, FOLATE, FERRITIN, TIBC, IRON,  RETICCTPCT in the last 72 hours. Sepsis Labs: No results for input(s): PROCALCITON, LATICACIDVEN in the last 168 hours.  Recent Results (from the past 240 hour(s))  Aerobic/Anaerobic Culture (surgical/deep wound)     Status: None (Preliminary result)   Collection Time: 09/28/19 12:05 PM   Specimen: Abscess; Wound  Result Value Ref Range Status   Specimen Description ABSCESS  Final   Special Requests LEFT LEG SPEC B  Final   Gram Stain   Final    FEW WBC PRESENT, PREDOMINANTLY PMN RARE GRAM POSITIVE COCCI IN CLUSTERS Performed at [facility], [address] [place], [place]    Culture   Final    FEW METHICILLIN RESISTANT STAPHYLOCOCCUS AUREUS NO ANAEROBES ISOLATED; CULTURE IN PROGRESS FOR 5 DAYS    Report Status PENDING  Incomplete   Organism ID, Bacteria METHICILLIN RESISTANT STAPHYLOCOCCUS AUREUS  Final      Susceptibility   Methicillin resistant  staphylococcus aureus - MIC*    CIPROFLOXACIN >=8 RESISTANT Resistant     ERYTHROMYCIN >=8 RESISTANT Resistant     GENTAMICIN <=0.5 SENSITIVE Sensitive     OXACILLIN >=4 RESISTANT Resistant     TETRACYCLINE >=16 RESISTANT Resistant     VANCOMYCIN 1 SENSITIVE Sensitive     TRIMETH/SULFA <=10 SENSITIVE Sensitive     CLINDAMYCIN >=8 RESISTANT Resistant     RIFAMPIN <=0.5 SENSITIVE Sensitive     Inducible Clindamycin NEGATIVE Sensitive     * FEW METHICILLIN RESISTANT STAPHYLOCOCCUS AUREUS  Aerobic/Anaerobic Culture (surgical/deep wound)     Status: None (Preliminary result)   Collection Time: 09/28/19 12:11 PM   Specimen: Abscess  Result Value Ref Range Status   Specimen Description ABSCESS  Final   Special Requests LEFT LEG SPEC A  Final   Gram Stain   Final    MODERATE WBC PRESENT, PREDOMINANTLY PMN FEW GRAM POSITIVE COCCI IN PAIRS IN CLUSTERS Performed at [facility], [address], [place], [place] [address]    Culture   Final    FEW METHICILLIN RESISTANT STAPHYLOCOCCUS AUREUS NO ANAEROBES ISOLATED;  CULTURE IN PROGRESS FOR 5 DAYS    Report Status PENDING  Incomplete   Organism ID, Bacteria METHICILLIN RESISTANT STAPHYLOCOCCUS AUREUS  Final      Susceptibility   Methicillin resistant staphylococcus aureus - MIC*    CIPROFLOXACIN >=8 RESISTANT Resistant     ERYTHROMYCIN >=8 RESISTANT Resistant     GENTAMICIN <=0.5 SENSITIVE Sensitive     OXACILLIN >=4 RESISTANT Resistant     TETRACYCLINE >=16 RESISTANT Resistant     VANCOMYCIN 1 SENSITIVE Sensitive     TRIMETH/SULFA <=10 SENSITIVE Sensitive     CLINDAMYCIN >=8 RESISTANT Resistant     RIFAMPIN <=0.5 SENSITIVE Sensitive     Inducible Clindamycin NEGATIVE Sensitive     * FEW METHICILLIN RESISTANT STAPHYLOCOCCUS AUREUS  SARS Coronavirus 2 by RT PCR (hospital order, performed in [facility] hospital lab) Nasopharyngeal Nasopharyngeal Swab     Status: None   Collection Time: 09/28/19  2:09 PM   Specimen: Nasopharyngeal Swab  Result Value Ref Range Status   SARS Coronavirus 2 NEGATIVE NEGATIVE Final    Comment: (NOTE) SARS-CoV-2 target nucleic acids are NOT DETECTED.  The SARS-CoV-2 RNA is generally detectable in upper and lower respiratory specimens during the acute phase of infection. The lowest concentration of SARS-CoV-2 viral copies this assay can detect is 250 copies / mL. A negative result does not preclude SARS-CoV-2 infection and should not be used as the sole basis for treatment or other patient management decisions.  A negative result may occur with improper specimen collection / handling, submission of specimen other than nasopharyngeal swab, presence of viral mutation(s) within the areas targeted by this assay, and inadequate number of viral copies (<250 copies / mL). A negative result must be combined with clinical observations, patient history, and epidemiological information.  Fact Sheet for Patients:   BoilerBrush.com.cy  Fact Sheet for Healthcare  Providers: https://pope.com/  This test is not yet approved or  cleared by the [place] FDA and has been authorized for detection and/or diagnosis of SARS-CoV-2 by FDA under an Emergency Use Authorization (EUA).  This EUA will remain in effect (meaning this test can be used) for the duration of the COVID-19 declaration under Section 564(b)(1) of the Act, 21 U.S.C. section 360bbb-3(b)(1), unless the authorization is terminated or revoked sooner.  Performed at [facility], [address], [place], [place] [address]  Radiology Studies: CT FEMUR LEFT W CONTRAST  Result Date: 10/01/2019 CLINICAL DATA:  Osteomyelitis of the femur EXAM: CT OF THE LOWER RIGHT EXTREMITY WITH CONTRAST TECHNIQUE: Multidetector CT imaging of the lower right extremity was performed according to the standard protocol following intravenous contrast administration. COMPARISON:  Radiographs from 09/25/2019 CONTRAST:  OMNIPAQUE IOHEXOL 300 MG/ML  SOLN FINDINGS: Bones/Joint/Cartilage Cemented intramedullary knee arthrodesis nail system noted with spacer and locking bolts in place. Along the femoral component, there is thick periosteal reaction especially laterally for example on image 165/3. There is also some lucency tracking between the seem in surrounding the femoral nail and the adjacent and ostium, and on image 172/3 there appears to be a channel through the cortex posterolaterally extending towards hazy complex density in the soft tissues, this could represent a drainage site although I do not see a well-defined extension to the skin. On image 176/3 there is a similar channel through the bone attaching to the lucency between the C mint and the and ostium, other potential channels include anteriorly on image 192/3. Along the distal bony margin of the femur, there is a fluid collection surrounding the proximal spacer and bolt system, and distending the soft tissues for  example anteriorly on image 231/3. Although postoperative fluid collection might certainly be a consideration, abscess is not excluded. The fluid collection along the anterolateral margin of the spacer has superficial marginally about 4 mm deep to the skin surface. There some small bony fragments anteriorly. Patella is absent. Bridging bony fusion between the proximal tibia and fibula. As in the femur, along the cemented tibial nail, there is some lucency tracking between the cement and the sclerotic edge of the medullary space especially proximally as shown for example on image 257/3. There are some channels extending through the bone for example anteriorly in the tibia on image 265/3 and on image 277/3 where there is a defect both of the bone and the seem band. A smaller anterior channel as shown on image 290/3. There is chronic periosteal reaction in this portion of the tibia for example as shown on image 308/3. There is some ossification along the interosseous membrane on the left shown on bottom most images such image 356/3. There is some similar findings on the right along with deformity of the right tibia as shown on image 357/3, only partially included on today's exam. There is also a right knee effusion and right knee osteoarthritis. Ligaments Suboptimally assessed by CT. Muscles and Tendons There is some mild atrophy of the gastrocnemius musculature. Soft tissues Subcutaneous edema especially anteriorly at the level of the arthrodesis spacer, cellulitis not excluded. Aside from the large fluid collection around the spacer a drainable fluid collection is not observed. IMPRESSION: 1. Cemented intramedullary knee arthrodesis nail system. High likelihood of chronic infection giving the lucency extending between the segment and the adjacent bone with several channels tracking through the bone both along the distal femoral bone and in the proximal tibia, with associated chronic periostitis. Moreover, surrounding  the spacer but especially anteriorly, there is a notable fluid collection measuring up to about 2.9 cm in anterior-posterior dimension, with superficial margin only about 4 mm deep to the skin surface, which could represent infected fluid. Electronically Signed   By: Gaylyn Rong M.D.   On: 10/01/2019 20:32        Scheduled Meds: . Chlorhexidine Gluconate Cloth  6 each Topical Daily  . enoxaparin (LOVENOX) injection  40 mg Subcutaneous Q24H  . fluticasone furoate-vilanterol  1 puff Inhalation Daily  . lactobacillus acidophilus  2 tablet Oral TID  . lisinopril  20 mg Oral Q breakfast  . rifampin  300 mg Oral Q12H  . sodium chloride flush  10-40 mL Intracatheter Q12H   Continuous Infusions: . vancomycin 750 mg (10/03/19 0643)     LOS: 5 days   Time spent= 35 mins    Rennie Rouch Joline Maxcyhirag Nariah Morgano, MD [facility]  If 7PM-7AM, please contact night-coverage  10/03/2019, 7:39 AM

## 2019-10-03 NOTE — Plan of Care (Signed)
  Problem: Safety: Goal: Ability to remain free from injury will improve Outcome: Progressing   

## 2019-10-03 NOTE — Plan of Care (Signed)
  Problem: Education: Goal: Knowledge of General Education information will improve Description: Including pain rating scale, medication(s)/side effects and non-pharmacologic comfort measures Outcome: Progressing   Problem: Clinical Measurements: Goal: Ability to maintain clinical measurements within normal limits will improve Outcome: Progressing Goal: Diagnostic test results will improve Outcome: Progressing Goal: Respiratory complications will improve Outcome: Progressing   Problem: Activity: Goal: Risk for activity intolerance will decrease Outcome: Progressing   

## 2019-10-03 NOTE — Plan of Care (Signed)

## 2019-10-03 NOTE — Progress Notes (Signed)
[facility]  Date of Admission:  09/28/2019      Total days of antibiotics 6  Vancomycin + Rifampin         \  ASSESSMENT: [name] is a 64 y.o. male with concern for recurrent abscess to left leg overlying extensive fusion hardware. Superficial cultures from anterior tibia indicate MRSA. CT scan indicates a high degree of concern for chronic infection with lucency extending between the segment of the adjacent bones with possible areas of tracking. Surrounding the spacer anteriorly there is a notable fluid collection measuring 2.9 cm anteriorly.   Dr. Lajoyce Corners to evaluate for surgical interventions. Best option for cure may be amputation with concern over deeper infection.  Continue IV vancomycin for now pending further     PLAN: 1. Need to see what ortho's thoughts are re: CT with likely higher up chronic infection  2. For now continue vancomycin + rifampin.     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 Chlorhexidine Gluconate Cloth  6 each Topical Daily  . enoxaparin (LOVENOX) injection  40 mg Subcutaneous Q24H  . fluticasone furoate-vilanterol  1 puff Inhalation Daily  . lactobacillus acidophilus  2 tablet Oral TID  . lisinopril  20 mg Oral Q breakfast  . rifampin  300 mg Oral Q12H  . sodium chloride flush  10-40 mL Intracatheter Q12H    SUBJECTIVE: Feeling OK. Up in the chair. Still with some pain in the left lateral thigh    Review of Systems: Review of Systems  Constitutional: Negative for chills, fever and weight loss.  Respiratory: Negative for cough and shortness of breath.   Cardiovascular:  Positive for leg swelling (improved ). Negative for chest pain.  Gastrointestinal: Negative for abdominal pain, diarrhea, nausea and vomiting.  Genitourinary: Negative for dysuria.  Musculoskeletal: Positive for joint pain. Negative for back pain and myalgias.  Skin: Negative for rash.       Anterior leg wrapped with clean dressings.   Neurological: Negative for dizziness and focal weakness.    No Known Allergies  OBJECTIVE: Vitals:   10/02/19 0751 10/02/19 1321 10/02/19 2125 10/03/19 0507  BP: 140/75 130/77 118/63 123/64  Pulse: 68 70 76 63  Resp: 16 16 18 20   Temp: 98.7 F (37.1 C) 98.3 F (36.8 C) 98.4 F (36.9 C) 98 F (36.7 C)  TempSrc:  Oral Oral Oral  SpO2: 96% 96% 100% 100%  Weight:      Height:       Body mass index is 18.79 kg/m.  Physical Exam Constitutional:      Comments: Sitting upright in recliner. Appears well today.   HENT:     Mouth/Throat:     Mouth: Mucous membranes are moist.     Pharynx: Oropharynx is clear.  Eyes:     General: No scleral icterus. Cardiovascular:     Rate and Rhythm: Normal rate and regular rhythm.     Pulses: Normal pulses.     Heart sounds: No murmur heard.   Pulmonary:     Effort: Pulmonary effort is normal.     Breath sounds: Normal breath sounds.  Abdominal:  General: Abdomen is flat. There is no distension.     Tenderness: There is no abdominal tenderness.  Musculoskeletal:     Comments: Anterior left lower extremity wrapped in clean dressings.  Pain at left knee cap and upper lateral femur.   Skin:    General: Skin is warm and dry.     Capillary Refill: Capillary refill takes less than 2 seconds.  Neurological:     Mental Status: He is alert and oriented to person, place, and time.     Lab Results Lab Results  Component Value Date   WBC 6.3 10/03/2019   HGB 9.8 (L) 10/03/2019   HCT 33.1 (L) 10/03/2019   MCV 84.4 10/03/2019   PLT 326 10/03/2019    Lab Results  Component Value Date   CREATININE 0.63  10/03/2019   BUN 7 (L) 10/03/2019   NA 134 (L) 10/03/2019   K 3.9 10/03/2019   CL 96 (L) 10/03/2019   CO2 29 10/03/2019    Lab Results  Component Value Date   ALT 18 04/13/2019   AST 21 04/13/2019   ALKPHOS 71 04/13/2019   BILITOT 0.1 (L) 04/13/2019     Microbiology: Wound Cx 8/14 >> MRSA (R-clinda, doxy)   [name], MSN, NP-C [facility]  [email or web address] Pager: [phone] Office: [phone] [facility] Main Line: [phone]

## 2019-10-04 ENCOUNTER — Telehealth: Payer: Self-pay

## 2019-10-04 DIAGNOSIS — L02419 Cutaneous abscess of limb, unspecified: Secondary | ICD-10-CM

## 2019-10-04 DIAGNOSIS — M869 Osteomyelitis, unspecified: Secondary | ICD-10-CM

## 2019-10-04 LAB — CBC
HCT: 32.4 % — ABNORMAL LOW (ref 39.0–52.0)
Hemoglobin: 9.6 g/dL — ABNORMAL LOW (ref 13.0–17.0)
MCH: 24.9 pg — ABNORMAL LOW (ref 26.0–34.0)
MCHC: 29.6 g/dL — ABNORMAL LOW (ref 30.0–36.0)
MCV: 83.9 fL (ref 80.0–100.0)
Platelets: 330 10*3/uL (ref 150–400)
RBC: 3.86 MIL/uL — ABNORMAL LOW (ref 4.22–5.81)
RDW: 19.3 % — ABNORMAL HIGH (ref 11.5–15.5)
WBC: 6.1 10*3/uL (ref 4.0–10.5)
nRBC: 0 % (ref 0.0–0.2)

## 2019-10-04 LAB — BASIC METABOLIC PANEL
Anion gap: 8 (ref 5–15)
BUN: 7 mg/dL — ABNORMAL LOW (ref 8–23)
CO2: 28 mmol/L (ref 22–32)
Calcium: 9.4 mg/dL (ref 8.9–10.3)
Chloride: 97 mmol/L — ABNORMAL LOW (ref 98–111)
Creatinine, Ser: 0.57 mg/dL — ABNORMAL LOW (ref 0.61–1.24)
GFR calc Af Amer: >60 mL/min (ref >60)
GFR calc non Af Amer: >60 mL/min (ref >60)
Glucose, Bld: 109 mg/dL — ABNORMAL HIGH (ref 70–99)
Potassium: 4.1 mmol/L (ref 3.5–5.1)
Sodium: 133 mmol/L — ABNORMAL LOW (ref 135–145)

## 2019-10-04 MED ORDER — POLYETHYLENE GLYCOL 3350 17 G PO PACK: 17.0000 g | PACK | Freq: Every day | ORAL | 0 refills | Status: DC | PRN

## 2019-10-04 MED ORDER — SODIUM CHLORIDE 0.9 % IV SOLN
500.0000 mg | Freq: Every day | INTRAVENOUS | Status: DC
  Administered 2019-10-04: 500 mg via INTRAVENOUS
  Filled 2019-10-04: qty 10

## 2019-10-04 MED ORDER — DAPTOMYCIN IV (FOR PTA / DISCHARGE USE ONLY): 500.0000 mg | INTRAVENOUS | 0 refills | Status: AC

## 2019-10-04 MED ORDER — RIFAMPIN 300 MG PO CAPS: 300.0000 mg | ORAL_CAPSULE | Freq: Two times a day (BID) | ORAL | 0 refills | Status: DC

## 2019-10-04 MED ORDER — OXYCODONE HCL 5 MG PO TABS: 5.0000 mg | ORAL_TABLET | Freq: Four times a day (QID) | ORAL | 0 refills | Status: DC | PRN

## 2019-10-04 MED ORDER — SODIUM CHLORIDE 0.9 % IV SOLN
500.0000 mg | Freq: Every day | INTRAVENOUS | Status: DC
  Filled 2019-10-04: qty 10

## 2019-10-04 MED ORDER — SENNOSIDES-DOCUSATE SODIUM 8.6-50 MG PO TABS
1.0000 | ORAL_TABLET | Freq: Every evening | ORAL | 0 refills | Status: DC | PRN
Start: 2019-10-04 — End: 2019-10-04

## 2019-10-04 MED ORDER — SENNOSIDES-DOCUSATE SODIUM 8.6-50 MG PO TABS
1.0000 | ORAL_TABLET | Freq: Every evening | ORAL | 0 refills | Status: DC | PRN
Start: 2019-10-04 — End: 2020-05-27

## 2019-10-04 MED ORDER — HEPARIN SOD (PORK) LOCK FLUSH 100 UNIT/ML IV SOLN
250.0000 [IU] | INTRAVENOUS | Status: AC | PRN
  Administered 2019-10-04: 250 [IU]
  Filled 2019-10-04: qty 2.5

## 2019-10-04 MED FILL — SENEXON-S 8.6-50 MG TABS: 8.6-50 | 30 days supply | Qty: 30 | Fill #0

## 2019-10-04 MED FILL — rifAMPin 300 MG CAPS: 300 | 30 days supply | Qty: 60 | Fill #0

## 2019-10-04 MED FILL — POLYETHYLENE GLYCOL 3350 PO: 17 | 14 days supply | Qty: 238 | Fill #0

## 2019-10-04 MED FILL — oxyCODONE HCL 5 MG TABS: 5 | 7 days supply | Qty: 30 | Fill #0

## 2019-10-04 NOTE — Progress Notes (Signed)
    [facility]    Date of Admission:  09/28/2019   Total days of antibiotics 7           ID: [name] is a 64 y.o. 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 Going to do physical therapy. Spoke with Dr Lajoyce Corners and thinking about amputation at later date  Medications:  . Chlorhexidine Gluconate Cloth  6 each Topical Daily  . enoxaparin (LOVENOX) injection  40 mg Subcutaneous Q24H  . fluticasone furoate-vilanterol  1 puff Inhalation Daily  . lactobacillus acidophilus  2 tablet Oral TID  . lisinopril  20 mg Oral Q breakfast  . rifampin  300 mg Oral Q12H  . sodium chloride flush  10-40 mL Intracatheter Q12H    Objective: Vital signs in last 24 hours: Temp:  [97.8 F (36.6 C)-98.4 F (36.9 C)] 98 F (36.7 C) (08/20 0756) Pulse Rate:  [60-68] 60 (08/20 0756) Resp:  [16-17] 17 (08/20 0756) BP: (123-136)/(78-91) 123/78 (08/20 0756) SpO2:  [100 %] 100 % (08/20 0840) Physical Exam  Constitutional: He is oriented to person, place, and time. He appears well-developed and well-nourished. No distress.  HENT:  Mouth/Throat: Oropharynx is clear and moist. No oropharyngeal exudate.  Cardiovascular: Normal rate, regular rhythm and normal heart sounds. Exam reveals no gallop and no friction rub.  No murmur heard.  Pulmonary/Chest: Effort normal and breath sounds normal. No respiratory distress. He has no wheezes.  Abdominal: Soft. Bowel sounds are normal. He exhibits no distension. There is no tenderness.  VQM:GQQP line in c/d/i right arm Neurological: He is  alert and oriented to person, place, and time.  Skin: Skin is warm and dry. No rash noted. No erythema.  Psychiatric: He has a normal mood and affect. His behavior is normal.     Lab Results Recent Labs    10/03/19 0435 10/04/19 0415  WBC 6.3 6.1  HGB 9.8* 9.6*  HCT 33.1* 32.4*  NA 134* 133*  K 3.9 4.1  CL 96* 97*  CO2 29 28  BUN 7* 7*  CREATININE 0.63 0.57*    Microbiology: Methicillin resistant staphylococcus aureus      MIC    CIPROFLOXACIN >=8 RESISTANT  Resistant    CLINDAMYCIN >=8 RESISTANT  Resistant    ERYTHROMYCIN >=8 RESISTANT  Resistant    GENTAMICIN <=0.5 SENSI... Sensitive    Inducible Clindamycin NEGATIVE  Sensitive    OXACILLIN >=4 RESISTANT  Resistant    RIFAMPIN <=0.5 SENSI... Sensitive    TETRACYCLINE >=16 RESIST... Resistant    TRIMETH/SULFA <=10 SENSIT... Sensitive    VANCOMYCIN 1 SENSITIVE  Sensitive         Susceptibility Comments   Studies/Results: No results found.   Assessment/Plan: mrsa chronic wound/fusion of left knee = will change to daptomycin 8mg /kg daily x 6 wks. Will do daily ck and cr weekly. We will see back in 4-6 wk and also follow up in ID clinic and orthopedics.  [facility] Infect[facility] Diseases Cell: [phone] Pager: [phone]  10/04/2019, 4:04 PM
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
## 2019-10-04 NOTE — Plan of Care (Signed)

## 2019-10-04 NOTE — Telephone Encounter (Signed)
-----   Message from Judyann Munson, MD sent at 10/04/2019  4:10 PM EDT ----- Can I see him back in 4 wk

## 2019-10-04 NOTE — Discharge Summary (Signed)
Physician Discharge Summary  [name] YNW:[id number] DOB: Jun 15, 1955 DOA: 09/28/2019  PCP: Nolene Ebbs, MD  Admit date: 09/28/2019 Discharge date: 10/04/2019  Admitted From: Home  Disposition:  Home  Recommendations for Outpatient Follow-up:  1. Follow up with PCP in 1-2 weeks 2. Please obtain BMP/CBC in one week your next doctors visit.  3. Home Health ordered 4. Daptomycin IV at home, Rifampin PO. Last day of IV Abx for now is 11/09/19 5. Dr Sharol Given, follow up outpatient.   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  Orthopedic and infectious disease were consulted.  Initially patient was on IV vancomycin, meropenem and Zosyn thereafter wound cultures grew MRSA therefore transition to vancomycin alone.  PICC line placed on 10/01/2019.  Ortho is currently following the patient regarding complicated left knee status. It was determined patient will follow up with ortho outpatient Dr Sharol Given for decision on Amptutation, in the meantime will d/c home with IV Daptomycin and PO Rifampin.   Patient is medically stable to be discharged home with outpatient follow-up recommendations as stated above.   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  Last day as of now planned is on 11/09/2019. -Length of antibiotics to be determined by infectious disease -Case discussed between orthopedic provider, Dr. Sharol Given will follow up patient outpatient for amputation  History of COPD with chronic hypoxia on 2 L nasal cannula -As needed bronchodilators.  Remains on 2 L nasal cannula  Essential hypertension -Lisinopril 20 mg daily  Home health services have been ordered.   Body mass index is 18.79 kg/m.         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 Active Problems:   Chronic obstructive airway disease with asthma (HCC)   Essential hypertension   Rupture of left patellar tendon, open, post-total knee replacement   Prosthetic joint infection (HCC)   Nicotine dependence, cigarettes, uncomplicated   Osteomyelitis of left knee region [facility])      Consultations:  Orthopedic  Infectious disease  Subjective: Patient feels great, he wishes to go home  Discharge Exam: Vitals:   10/04/19 0756 10/04/19 0840  BP: 123/78   Pulse: 60   Resp: 17   Temp: 98 F (36.7 C)   SpO2: 100% 100%   Vitals:   10/03/19 1954 10/04/19 0320 10/04/19 0756 10/04/19 0840  BP: (!) 126/91 136/79 123/78   Pulse: 67 68 60   Resp: _0 Temp: 97.8 F (36.6 C) 98.4 F (36.9 C) 98 F (36.7 C)   TempSrc: Oral Oral Oral   SpO2: 100% 100% 100% 100%  Weight:  Height:        General: Pt is alert, awake, not in acute distress Cardiovascular: RRR, S1/S2 +, no rubs, no gallops Respiratory: CTA bilaterally, no wheezing, no rhonchi Abdominal: Soft, NT, ND, bowel sounds + Extremities: no edema, no cyanosis Left lower extremity dressing noted.  Discharge Instructions  Discharge Instructions    [facility] pharmacist to adjust dose for Vancomycin, Aminoglycosides and other anti-infective therapies as requested by physician.   Complete by: As directed    [facility] to provide Cath Flo 37m   Complete by: As directed    Administer for PICC line occlusion and as ordered by physician for other access device issues.   Anaphylaxis Kit: Provided to treat any anaphylactic reaction to the medication being provided to the patient if First Dose or when requested by physician   Complete by: As directed    Epinephrine 162mml vial / amp: Administer 0.47m7m0.47ml15mubcutaneously once for moderate to severe anaphylaxis, nurse to call physician and pharmacy when reaction occurs and call 911 if needed for immediate care   Diphenhydramine 50mg40mIV vial: Administer 25-50mg 21mM PRN for first dose reaction, rash, itching, mild reaction, nurse to call physician and pharmacy when reaction occurs   Sodium Chloride 0.9% NS 500ml I55mdminister if needed for hypovolemic blood pressure drop or as ordered by physician after call to physician with anaphylactic reaction   Change dressing on IV access line weekly and PRN   Complete by: As directed    Flush IV access with Sodium Chloride 0.9% and Heparin 10 units/ml or 100 units/ml   Complete by: As directed    Home infusion instructions - [facility]   Complete by: As directed    Instructions: Flush IV access with Sodium Chloride 0.9% and Heparin 10units/ml or 100units/ml   Change dressing on IV access line: Weekly and PRN   Instructions Cath Flo 2mg: Ad347mister for PICC Line occlusion and as ordered by physician for other access device   [facility] pharmacist to adjust dose for: Vancomycin, Aminoglycosides and other anti-infective therapies as requested by physician   Method of administration may be changed at the discretion of home infusion pharmacist based upon assessment of the patient and/or caregiver's ability to  self-administer the medication ordered   Complete by: As directed      Allergies as of 10/04/2019   No Known Allergies     Medication List    STOP taking these medications   Breo Ellipta 200-25 MCG/INH Aepb Generic drug: fluticasone furoate-vilanterol   clindamycin 150 MG capsule Commonly known as: CLEOCIN     TAKE these medications   acetaminophen 500 MG tablet Commonly known as: TYLENOL Take 1,000 mg by mouth every 6 (six) hours as needed for headache (pain).   albuterol 108 (90 Base) MCG/ACT inhaler Commonly known as: VENTOLIN HFA Inhale 2 puffs into the lungs every 6 (six) hours as needed for wheezing or shortness of breath.   daptomycin  IVPB Commonly known as: CUBICIN Inject 500 mg into the vein daily. Indication: MRSA leg abscess w/ hardware First Dose: Yes Last Day of Therapy:  11/09/2019 Labs - Once weekly:  CBC/D, BMP, and CPK Labs - Every other week:  ESR and CRP Method of administration: IV Push Method of administration may be changed at the discretion of home infusion pharmacist based upon assessment of the patient and/or caregiver's ability to self-administer the medication ordered.   lisinopril 20 MG tablet Commonly known as: ZESTRIL  Take 1 tablet (20 mg total) by mouth daily with breakfast.   oxyCODONE 5 MG immediate release tablet Commonly known as: Oxy IR/ROXICODONE Take 1 tablet (5 mg total) by mouth every 6 (six) hours as needed for moderate pain.   polyethylene glycol 17 g packet Commonly known as: MiraLax Take 17 g by mouth daily as needed for moderate constipation.   rifampin 300 MG capsule Commonly known as: RIFADIN Take 1 capsule (300 mg total) by mouth every 12 (twelve) hours.   senna-docusate 8.6-50 MG tablet Commonly known as: Senokot-S Take 1 tablet by mouth at bedtime as needed for mild constipation.            Discharge Care Instructions  (From admission, onward)         Start     Ordered   10/04/19 0000  Change dressing  on IV access line weekly and PRN  (Home infusion instructions - [facility] )        10/04/19 1152          Follow-up Information    Newt Minion, MD Follow up in 1 week(s).   Specialty: Orthopedic Surgery Contact information: [place] [address] [phone]        Nolene Ebbs, MD. Schedule an appointment as soon as possible for a visit in 2 week(s).   Specialty: Internal Medicine Contact information: [place] [address] [phone]        [facility] Follow up.   Specialty: Home Health Services Why: [facility] will be providing you with a RN, medical social worker, physical therapist and occupational therapist. Contact information: [address] [place]  [address] [phone]        [facility] Follow up.   Why: [facility] will be providing you with IV antibiotic coordination to have your IV antibiotics delivered to the home.             No Known Allergies  You were cared for by a hospitalist during your hospital stay. If you have any questions about your discharge medications or the care you received while you were in the hospital after you are discharged, you can call the unit and asked to speak with the hospitalist on call if the hospitalist that took care of you is not available. Once you are discharged, your primary care physician will handle any further medical issues. Please note that no refills for any discharge medications will be authorized once you are discharged, as it is imperative that you return to your primary care physician (or establish a relationship with a primary care physician if you do not have one) for your aftercare needs so that they can reassess your need for medications and monitor your lab values.   Procedures/Studies: DG Knee 2 Views Left  Result Date: 09/28/2019 CLINICAL DATA:  Pain proximal to the left knee joint EXAM: LEFT KNEE - 1-2 VIEW COMPARISON:   09/25/2019, 07/26/2019, 06/29/2018 FINDINGS: Incompletely visualized prosthesis extending from the femur to the tibia with prior resection of the distal femur and knee joint. Chronic posttraumatic/postsurgical deformity of the proximal tibia. Multiple chronic calcifications about the expected location of the knee. Prominent soft tissue swelling which has increased compared to prior. No soft tissue emphysema. Vascular calcifications. IMPRESSION: 1. Incompletely visualized prosthesis extending from the femur to the tibia with prior resection of the distal femur and knee joint. 2. Increased anterior soft tissue swelling at the expected level of the knee. Findings could  be secondary to trauma, inflammation, or infection. Electronically Signed   By: Donavan Foil M.D.   On: 09/28/2019 02:30   DG Tibia/Fibula Left  Result Date: 09/25/2019 CLINICAL DATA:  Left lower leg abscess pain EXAM: LEFT TIBIA AND FIBULA - 2 VIEW COMPARISON:  07/26/2019, 06/29/2018 FINDINGS: Previous distal femoral and knee resection with placement of prosthesis extending from the shaft of the femur to the midshaft of the tibia. Similar appearance of the hardware. Chronic deformity at the proximal tibia. Numerous soft tissue calcifications at the previous location of the knee. Soft tissue swelling at the distal anterior thigh. Vascular calcifications. IMPRESSION: Postsurgical changes of the distal femur and tibia with stable hardware appearance and chronic tibial deformity. No definite acute osseous abnormality is seen. Electronically Signed   By: Donavan Foil M.D.   On: 09/25/2019 21:36   CT FEMUR LEFT W CONTRAST  Result Date: 10/01/2019 CLINICAL DATA:  Osteomyelitis of the femur EXAM: CT OF THE LOWER RIGHT EXTREMITY WITH CONTRAST TECHNIQUE: Multidetector CT imaging of the lower right extremity was performed according to the standard protocol following intravenous contrast administration. COMPARISON:  Radiographs from 09/25/2019 CONTRAST:   145m OMNIPAQUE IOHEXOL 300 MG/ML  SOLN FINDINGS: Bones/Joint/Cartilage Cemented intramedullary knee arthrodesis nail system noted with spacer and locking bolts in place. Along the femoral component, there is thick periosteal reaction especially laterally for example on image 165/3. There is also some lucency tracking between the seem in surrounding the femoral nail and the adjacent and ostium, and on image 172/3 there appears to be a channel through the cortex posterolaterally extending towards hazy complex density in the soft tissues, this could represent a drainage site although I do not see a well-defined extension to the skin. On image 176/3 there is a similar channel through the bone attaching to the lucency between the C mint and the and ostium, other potential channels include anteriorly on image 192/3. Along the distal bony margin of the femur, there is a fluid collection surrounding the proximal spacer and bolt system, and distending the soft tissues for example anteriorly on image 231/3. Although postoperative fluid collection might certainly be a consideration, abscess is not excluded. The fluid collection along the anterolateral margin of the spacer has superficial marginally about 4 mm deep to the skin surface. There some small bony fragments anteriorly. Patella is absent. Bridging bony fusion between the proximal tibia and fibula. As in the femur, along the cemented tibial nail, there is some lucency tracking between the cement and the sclerotic edge of the medullary space especially proximally as shown for example on image 257/3. There are some channels extending through the bone for example anteriorly in the tibia on image 265/3 and on image 277/3 where there is a defect both of the bone and the seem band. A smaller anterior channel as shown on image 290/3. There is chronic periosteal reaction in this portion of the tibia for example as shown on image 308/3. There is some ossification along the  interosseous membrane on the left shown on bottom most images such image 356/3. There is some similar findings on the right along with deformity of the right tibia as shown on image 357/3, only partially included on today's exam. There is also a right knee effusion and right knee osteoarthritis. Ligaments Suboptimally assessed by CT. Muscles and Tendons There is some mild atrophy of the gastrocnemius musculature. Soft tissues Subcutaneous edema especially anteriorly at the level of the arthrodesis spacer, cellulitis not excluded. Aside from the large  fluid collection around the spacer a drainable fluid collection is not observed. IMPRESSION: 1. Cemented intramedullary knee arthrodesis nail system. High likelihood of chronic infection giving the lucency extending between the segment and the adjacent bone with several channels tracking through the bone both along the distal femoral bone and in the proximal tibia, with associated chronic periostitis. Moreover, surrounding the spacer but especially anteriorly, there is a notable fluid collection measuring up to about 2.9 cm in anterior-posterior dimension, with superficial margin only about 4 mm deep to the skin surface, which could represent infected fluid. Electronically Signed   By: Van Clines M.D.   On: 10/01/2019 20:32   DG Chest Portable 1 View  Result Date: 09/25/2019 CLINICAL DATA:  Shortness of breath EXAM: PORTABLE CHEST 1 VIEW COMPARISON:  07/25/2019 chest radiograph and prior. FINDINGS: Stable cardiomediastinal silhouette. No focal airspace opacity. No pneumothorax or pleural effusion. Multilevel spondylosis and right glenohumeral osteoarthrosis. IMPRESSION: No acute airspace disease. Electronically Signed   By: Primitivo Gauze M.D.   On: 09/25/2019 09:07   DG Femur Min 2 Views Left  Result Date: 09/25/2019 CLINICAL DATA:  Left leg pain anterior side of femur EXAM: LEFT FEMUR 2 VIEWS COMPARISON:  07/26/2019 FINDINGS: No acute displaced  fracture or malalignment is seen. Extensive vascular calcification. Prior distal femoral resection and placement of femoral prosthetic that is contiguous with tibial component. Mild generalized soft tissue swelling. Multiple oval and rounded calcifications anterior to the distal femoral prosthetic. Smooth cortical bone thickening at the femur presumably due to chronic remodeling from femoral prosthesis. IMPRESSION: 1. No acute osseous abnormality. 2. Prior distal femoral resection and placement of femoral prosthetic that is contiguous with the tibial component. Generalized soft tissue swelling at the distal anterior thigh. Electronically Signed   By: Donavan Foil M.D.   On: 09/25/2019 21:32   [place] EKG SITE RITE  Result Date: 09/30/2019 If Site Rite image not attached, placement could not be confirmed due to current cardiac rhythm.     The results of significant diagnostics from this hospitalization (including imaging, microbiology, ancillary and laboratory) are listed below for reference.     Microbiology: Recent Results (from the past 240 hour(s))  Aerobic/Anaerobic Culture (surgical/deep wound)     Status: None   Collection Time: 09/28/19 12:05 PM   Specimen: Abscess; Wound  Result Value Ref Range Status   Specimen Description ABSCESS  Final   Special Requests LEFT LEG SPEC B  Final   Gram Stain   Final    FEW WBC PRESENT, PREDOMINANTLY PMN RARE GRAM POSITIVE COCCI IN CLUSTERS    Culture   Final    FEW METHICILLIN RESISTANT STAPHYLOCOCCUS AUREUS NO ANAEROBES ISOLATED Performed at [facility], [address], [place], [place] [address]    Report Status 10/03/2019 FINAL  Final   Organism ID, Bacteria METHICILLIN RESISTANT STAPHYLOCOCCUS AUREUS  Final      Susceptibility   Methicillin resistant staphylococcus aureus - MIC*    CIPROFLOXACIN >=8 RESISTANT Resistant     ERYTHROMYCIN >=8 RESISTANT Resistant     GENTAMICIN <=0.5 SENSITIVE Sensitive     OXACILLIN >=4 RESISTANT  Resistant     TETRACYCLINE >=16 RESISTANT Resistant     VANCOMYCIN 1 SENSITIVE Sensitive     TRIMETH/SULFA <=10 SENSITIVE Sensitive     CLINDAMYCIN >=8 RESISTANT Resistant     RIFAMPIN <=0.5 SENSITIVE Sensitive     Inducible Clindamycin NEGATIVE Sensitive     * FEW METHICILLIN RESISTANT STAPHYLOCOCCUS AUREUS  Aerobic/Anaerobic Culture (surgical/deep  wound)     Status: None   Collection Time: 09/28/19 12:11 PM   Specimen: Abscess  Result Value Ref Range Status   Specimen Description ABSCESS  Final   Special Requests LEFT LEG SPEC A  Final   Gram Stain   Final    MODERATE WBC PRESENT, PREDOMINANTLY PMN FEW GRAM POSITIVE COCCI IN PAIRS IN CLUSTERS    Culture   Final    FEW METHICILLIN RESISTANT STAPHYLOCOCCUS AUREUS NO ANAEROBES ISOLATED Performed at [facility], [place] [address], [place], [place] [address]    Report Status 10/03/2019 FINAL  Final   Organism ID, Bacteria METHICILLIN RESISTANT STAPHYLOCOCCUS AUREUS  Final      Susceptibility   Methicillin resistant staphylococcus aureus - MIC*    CIPROFLOXACIN >=8 RESISTANT Resistant     ERYTHROMYCIN >=8 RESISTANT Resistant     GENTAMICIN <=0.5 SENSITIVE Sensitive     OXACILLIN >=4 RESISTANT Resistant     TETRACYCLINE >=16 RESISTANT Resistant     VANCOMYCIN 1 SENSITIVE Sensitive     TRIMETH/SULFA <=10 SENSITIVE Sensitive     CLINDAMYCIN >=8 RESISTANT Resistant     RIFAMPIN <=0.5 SENSITIVE Sensitive     Inducible Clindamycin NEGATIVE Sensitive     * FEW METHICILLIN RESISTANT STAPHYLOCOCCUS AUREUS  SARS Coronavirus 2 by RT PCR (hospital order, performed in [facility] hospital lab) Nasopharyngeal Nasopharyngeal Swab     Status: None   Collection Time: 09/28/19  2:09 PM   Specimen: Nasopharyngeal Swab  Result Value Ref Range Status   SARS Coronavirus 2 NEGATIVE NEGATIVE Final    Comment: (NOTE) SARS-CoV-2 target nucleic acids are NOT DETECTED.  The SARS-CoV-2 RNA is generally detectable in upper and lower respiratory  specimens during the acute phase of infection. The lowest concentration of SARS-CoV-2 viral copies this assay can detect is 250 copies / mL. A negative result does not preclude SARS-CoV-2 infection and should not be used as the sole basis for treatment or other patient management decisions.  A negative result may occur with improper specimen collection / handling, submission of specimen other than nasopharyngeal swab, presence of viral mutation(s) within the areas targeted by this assay, and inadequate number of viral copies (<250 copies / mL). A negative result must be combined with clinical observations, patient history, and epidemiological information.  Fact Sheet for Patients:   StrictlyIdeas.no  Fact Sheet for Healthcare Providers: BankingDealers.co.za  This test is not yet approved or  cleared by the [place] FDA and has been authorized for detection and/or diagnosis of SARS-CoV-2 by FDA under an Emergency Use Authorization (EUA).  This EUA will remain in effect (meaning this test can be used) for the duration of the COVID-19 declaration under Section 564(b)(1) of the Act, 21 U.S.C. section 360bbb-3(b)(1), unless the authorization is terminated or revoked sooner.  Performed at [facility], [place] [address], [place], [place] [address]      Labs: BNP (last 3 results) Recent Labs    04/13/19 2141 04/15/19 1756 07/13/19 2119  BNP 20.3 27.0 631.4*   Basic Metabolic Panel: Recent Labs  Lab 09/30/19 0031 10/01/19 0732 10/02/19 0341 10/03/19 0435 10/04/19 0415  NA 133* 134* 132* 134* 133*  K 4.4 4.5 4.3 3.9 4.1  CL 94* 95* 94* 96* 97*  CO2 _0 GLUCOSE 104* 114* 113* 163* 109*  BUN 6* 10 8 7* 7*  CREATININE 0.62 0.61 0.65 0.63 0.57*  CALCIUM 9.4 9.5 9.4 9.3 9.4   Liver Function  Tests: No results for input(s): AST, ALT, ALKPHOS, BILITOT, PROT, ALBUMIN in the last 168 hours. No results for  input(s): LIPASE, AMYLASE in the last 168 hours. No results for input(s): AMMONIA in the last 168 hours. CBC: Recent Labs  Lab 09/28/19 1205 09/29/19 0138 09/30/19 0031 10/01/19 0732 10/02/19 0341 10/03/19 0435 10/04/19 0415  WBC 10.3   < > 7.9 8.5 7.8 6.3 6.1  NEUTROABS 8.1*  --   --   --   --   --   --   HGB 10.4*   < > 10.0* 10.8* 10.9* 9.8* 9.6*  HCT 35.2*   < > 34.3* 36.8* 36.3* 33.1* 32.4*  MCV 83.0   < > 83.3 83.3 82.3 84.4 83.9  PLT 306   < > 317 327 322 326 330   < > = values in this interval not displayed.   Cardiac Enzymes: No results for input(s): CKTOTAL, CKMB, CKMBINDEX, TROPONINI in the last 168 hours. BNP: Invalid input(s): POCBNP CBG: No results for input(s): GLUCAP in the last 168 hours. D-Dimer No results for input(s): DDIMER in the last 72 hours. Hgb A1c No results for input(s): HGBA1C in the last 72 hours. Lipid Profile No results for input(s): CHOL, HDL, LDLCALC, TRIG, CHOLHDL, LDLDIRECT in the last 72 hours. Thyroid function studies No results for input(s): TSH, T4TOTAL, T3FREE, THYROIDAB in the last 72 hours.  Invalid input(s): FREET3 Anemia work up No results for input(s): VITAMINB12, FOLATE, FERRITIN, TIBC, IRON, RETICCTPCT in the last 72 hours. Urinalysis    Component Value Date/Time   COLORURINE YELLOW 06/26/2015 1401   APPEARANCEUR CLEAR 06/26/2015 1401   LABSPEC 1.021 06/26/2015 1401   PHURINE 7.5 06/26/2015 1401   GLUCOSEU NEGATIVE 06/26/2015 1401   HGBUR NEGATIVE 06/26/2015 1401   BILIRUBINUR NEGATIVE 06/26/2015 1401   KETONESUR NEGATIVE 06/26/2015 1401   PROTEINUR NEGATIVE 06/26/2015 1401   UROBILINOGEN 0.2 03/21/2013 1135   NITRITE NEGATIVE 06/26/2015 1401   LEUKOCYTESUR NEGATIVE 06/26/2015 1401   Sepsis Labs Invalid input(s): PROCALCITONIN,  WBC,  LACTICIDVEN Microbiology Recent Results (from the past 240 hour(s))  Aerobic/Anaerobic Culture (surgical/deep wound)     Status: None   Collection Time: 09/28/19 12:05 PM    Specimen: Abscess; Wound  Result Value Ref Range Status   Specimen Description ABSCESS  Final   Special Requests LEFT LEG SPEC B  Final   Gram Stain   Final    FEW WBC PRESENT, PREDOMINANTLY PMN RARE GRAM POSITIVE COCCI IN CLUSTERS    Culture   Final    FEW METHICILLIN RESISTANT STAPHYLOCOCCUS AUREUS NO ANAEROBES ISOLATED Performed at [facility], [address], [place], [place] [address]    Report Status 10/03/2019 FINAL  Final   Organism ID, Bacteria METHICILLIN RESISTANT STAPHYLOCOCCUS AUREUS  Final      Susceptibility   Methicillin resistant staphylococcus aureus - MIC*    CIPROFLOXACIN >=8 RESISTANT Resistant     ERYTHROMYCIN >=8 RESISTANT Resistant     GENTAMICIN <=0.5 SENSITIVE Sensitive     OXACILLIN >=4 RESISTANT Resistant     TETRACYCLINE >=16 RESISTANT Resistant     VANCOMYCIN 1 SENSITIVE Sensitive     TRIMETH/SULFA <=10 SENSITIVE Sensitive     CLINDAMYCIN >=8 RESISTANT Resistant     RIFAMPIN <=0.5 SENSITIVE Sensitive     Inducible Clindamycin NEGATIVE Sensitive     * FEW METHICILLIN RESISTANT STAPHYLOCOCCUS AUREUS  Aerobic/Anaerobic Culture (surgical/deep wound)     Status: None   Collection Time: 09/28/19 12:11 PM   Specimen:  Abscess  Result Value Ref Range Status   Specimen Description ABSCESS  Final   Special Requests LEFT LEG SPEC A  Final   Gram Stain   Final    MODERATE WBC PRESENT, PREDOMINANTLY PMN FEW GRAM POSITIVE COCCI IN PAIRS IN CLUSTERS    Culture   Final    FEW METHICILLIN RESISTANT STAPHYLOCOCCUS AUREUS NO ANAEROBES ISOLATED Performed at [facility], [place] [address] [place], [place] [address]    Report Status 10/03/2019 FINAL  Final   Organism ID, Bacteria METHICILLIN RESISTANT STAPHYLOCOCCUS AUREUS  Final      Susceptibility   Methicillin resistant staphylococcus aureus - MIC*    CIPROFLOXACIN >=8 RESISTANT Resistant     ERYTHROMYCIN >=8 RESISTANT Resistant     GENTAMICIN <=0.5 SENSITIVE Sensitive     OXACILLIN >=4  RESISTANT Resistant     TETRACYCLINE >=16 RESISTANT Resistant     VANCOMYCIN 1 SENSITIVE Sensitive     TRIMETH/SULFA <=10 SENSITIVE Sensitive     CLINDAMYCIN >=8 RESISTANT Resistant     RIFAMPIN <=0.5 SENSITIVE Sensitive     Inducible Clindamycin NEGATIVE Sensitive     * FEW METHICILLIN RESISTANT STAPHYLOCOCCUS AUREUS  SARS Coronavirus 2 by RT PCR (hospital order, performed in [place] hospital lab) Nasopharyngeal Nasopharyngeal Swab     Status: None   Collection Time: 09/28/19  2:09 PM   Specimen: Nasopharyngeal Swab  Result Value Ref Range Status   SARS Coronavirus 2 NEGATIVE NEGATIVE Final    Comment: (NOTE) SARS-CoV-2 target nucleic acids are NOT DETECTED.  The SARS-CoV-2 RNA is generally detectable in upper and lower respiratory specimens during the acute phase of infection. The lowest concentration of SARS-CoV-2 viral copies this assay can detect is 250 copies / mL. A negative result does not preclude SARS-CoV-2 infection and should not be used as the sole basis for treatment or other patient management decisions.  A negative result may occur with improper specimen collection / handling, submission of specimen other than nasopharyngeal swab, presence of viral mutation(s) within the areas targeted by this assay, and inadequate number of viral copies (<250 copies / mL). A negative result must be combined with clinical observations, patient history, and epidemiological information.  Fact Sheet for Patients:   StrictlyIdeas.no  Fact Sheet for Healthcare Providers: BankingDealers.co.za  This test is not yet approved or  cleared by the [place] FDA and has been authorized for detection and/or diagnosis of SARS-CoV-2 by FDA under an Emergency Use Authorization (EUA).  This EUA will remain in effect (meaning this test can be used) for the duration of the COVID-19 declaration under Section 564(b)(1) of the Act, 21  U.S.C. section 360bbb-3(b)(1), unless the authorization is terminated or revoked sooner.  Performed at [facility], [place] [address] [place], [place] [address]      Time coordinating discharge:  I have spent 35 minutes face to face with the patient and on the ward discussing the patients care, assessment, plan and disposition with other care givers. >50% of the time was devoted counseling the patient about the risks and benefits of treatment/Discharge disposition and coordinating care.   SIGNED:   Damita Lack, MD  [facility] 10/04/2019, 12:04 PM   If 7PM-7AM, please contact night-coverage
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
## 2019-10-04 NOTE — Telephone Encounter (Signed)
Patient scheduled with Durwin Nora, NP as Dr. Drue Second did not have anything in 4 weeks.  [name]

## 2019-10-04 NOTE — Progress Notes (Signed)
PHARMACY CONSULT NOTE FOR:  OUTPATIENT  PARENTERAL ANTIBIOTIC THERAPY (OPAT)  Indication: Abscess of left leg with hardware Regimen: Daptomycin 500 mg q 24 hours + rifampin 300 mg PO q 12 hours End date: 11/09/2019  IV antibiotic discharge orders are pended. To discharging provider:  please sign these orders via discharge navigator,  Select New Orders & click on the button choice - Manage This Unsigned Work.     Thank you for allowing pharmacy to be a part of this patient's care.  Sharin Mons, PharmD, BCPS, BCIDP Infectious Diseases Clinical Pharmacist Phone: [phone] 10/04/2019, 11:04 AM

## 2019-10-04 NOTE — TOC Transition Note (Signed)
Transition of Care [facility]) - CM/SW Discharge Note   Patient Details  Name: [name] MRN: [id number] Date of Birth: 1955/03/16  [facility]) CM/SW Contact:  Janae Bridgeman, RN Phone Number: 10/04/2019, 2:34 PM   Clinical Narrative:    Case management spoke with the patient and family and the patient's PTAR was scheduled for 4 pm to transport home.  Jeri Modena, RNCM attempted to teach the patient IV antibiotic administration but was unable to do so - family was called and spoke with Andree Elk, family [phone]- and she will be giving the patient the medication at home. She is a Engineer, civil (consulting) and lives at the house.     Final next level of care: Home w Home Health Services Barriers to Discharge: No Barriers Identified   Patient Goals and CMS Choice Patient states their goals for this hospitalization and ongoing recovery are:: Patient plans to go home with home health services and IV antibiotics. CMS Medicare.gov Compare Post Acute Care list provided to:: Patient Choice offered to / list presented to : Patient  Discharge Placement                       Discharge Plan and Services   Discharge Planning Services: CM Consult, Medication Assistance Post Acute Care Choice: Home Health                    HH Arranged: RN, PT, OT, IV Antibiotics HH Agency: [facility] Date [facility] Agency Contacted: 10/04/19 Time HH Agency Contacted: 1000 Representative spoke with at [facility] Agency: Delila Spence,  [facility] - Jeri Modena  Social Determinants of Health (SDOH) Interventions     Readmission Risk Interventions Readmission Risk Prevention Plan 10/04/2019  Transportation Screening Complete  PCP or Specialist Appt within 5-7 Days Complete  Home Care Screening Complete  Medication Review (RN CM) Complete  Some recent data might be hidden

## 2019-10-04 NOTE — Progress Notes (Signed)
Provided discharge education/instructions, all questions and concerns addressed, Pt not in distress, meds from [facility] pharmacy delivered to room, PICC capped by IV team. Pt to discharge home with belongings via PTAR.

## 2019-10-04 NOTE — TOC Initial Note (Signed)
Transition of Care [facility] - Initial/Assessment Note    Patient Details  Name: [name] MRN: [id number] Date of Birth: 1955/05/01  Transition of Care [facility]) CM/SW Contact:    Curlene Labrum, RN Phone Number: 10/04/2019, 11:32 AM  Clinical Narrative:                 Case management met with the patient regarding transitions to home regarding Left knee infection - need for home antibiotics and coordination of IV antibiotics and teaching for home.  The patient lives with his girlfriend at the house and receives care (home health aide) through [facility] 5 days per week Thayer Headings, aide [phone].  I spoke to the patient about home health services to receive IV antibiotics and home health services.  The patient was given choice regarding home health but did not have a preference.  I spoke with Tommi Rumps, [facility] at [facility] and patient was set up with RN, PT, OT and MSW to facility PT services and teaching for antibiotic administration.  The patient currently has all dme at home including a wheelchair, RW.  The patient will need a PTAR ride home today after IV antibiotic teaching is completed by Carolynn Sayers, RN at the bedside through [place] [facility]).  The patient receives O2 therapy at home - unknown company at 3 liters/minute.  The patient also uses transportation to appointmens through social services.  Once all IV teaching is done today - Patient will take the [place] home.  Medications will be delivered through [facility].  Expected Discharge Plan: [place] Barriers to Discharge: No Barriers Identified   Patient Goals and CMS Choice Patient states their goals for this hospitalization and ongoing recovery are:: Patient plans to go home with home health services and IV antibiotics. CMS Medicare.gov Compare Post Acute Care list provided to:: Patient Choice offered to / list presented to : Patient  Expected Discharge Plan and  Services Expected Discharge Plan: [place]   Discharge Planning Services: CM Consult, Medication Assistance Post Acute Care Choice: [place] arrangements for the past 2 months: [place] Arranged: RN, PT, OT, IV Antibiotics HH Agency: [facility], [place] Date [facility] Agency Contacted: 10/04/19 Time HH Agency Contacted: 1000 Representative spoke with at [place]: Meredeth Ide,  [place]  Prior Living Arrangements/Services Living arrangements for the past 2 months: [place] Lives with:: Significant Other Patient language and need for interpreter reviewed:: Yes Do you feel safe going back to the place where you live?: Yes      Need for Family Participation in Patient Care: Yes (Comment) Care giver support system in place?: Yes (comment) Current home services: Homehealth aide (Patient has aide 5 days a week through [facility] aide [phone]) Criminal Activity/Legal Involvement Pertinent to Current Situation/Hospitalization: No - Comment as needed  Activities of Daily Living Home Assistive Devices/Equipment: None ADL Screening (condition at time of admission) Patient's cognitive ability adequate to safely complete daily activities?: Yes Is the patient deaf or have difficulty hearing?: No Does the patient have difficulty seeing, even when wearing glasses/contacts?: Yes Does the patient have difficulty concentrating, remembering, or making decisions?: Yes Patient able to express need for assistance with ADLs?: Yes  Does the patient have difficulty dressing or bathing?: Yes Independently performs ADLs?: No Does the patient have difficulty walking or climbing stairs?: Yes Weakness of Legs: Left Weakness of Arms/Hands: None  Permission Sought/Granted Permission sought to share information with : Case Manager Permission granted to share information with :  Yes, Verbal Permission Granted     Permission granted to share info w AGENCY: [facility] - [phone]  Permission granted to share info w Relationship: girlfriend - Fredrik Cove - [phone]     Emotional Assessment Appearance:: Appears stated age Attitude/Demeanor/Rapport: Gracious Affect (typically observed): Accepting Orientation: : Oriented to Self, Oriented to Place, Oriented to  Time, Oriented to Situation Alcohol / Substance Use: Not Applicable Psych Involvement: No (comment)  Admission diagnosis:  Osteomyelitis [facility]) [M86.9] Patient Active Problem List   Diagnosis Date Noted  . Osteomyelitis of left knee region ([place])   . 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 Acute exacerbation of COPD with asthma ([place]) 07/14/2019  . Nicotine dependence, cigarettes, uncomplicated 23/95/3202  . Acute bacterial bronchitis 07/14/2019  . Knee osteoarthritis 10/12/2015  . Gram-negative infection   . Surgical wound dehiscence 07/30/2015  . Wound dehiscence, surgical 07/29/2015  . Tobacco abuse 07/29/2015  . 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 Wound dehiscence 07/19/2015  . Rupture of left patellar tendon, open, post-total knee replacement 07/19/2015  . Patellar tendon rupture 07/19/2015  . Primary osteoarthritis of left knee 06/18/2015  . Chronic obstructive airway disease with asthma ([place]) 06/18/2015  . Essential hypertension 06/18/2015  . Special screening for malignant neoplasms, colon 12/11/2012   PCP:  Nolene Ebbs, MD Pharmacy:   [facility] [place] [facility] [place] [address] Phone: [phone] Fax: [phone]  [facility] [place], [place] - [address] [place] [address] Phone: [phone] Fax:  [phone]     Social Determinants of Health (SDOH) Interventions    Readmission Risk Interventions Readmission Risk Prevention Plan 10/04/2019  Transportation Screening Complete  PCP or Specialist Appt within 5-7 Days Complete  Home Care Screening Complete  Medication Review (RN CM) Complete  Some recent data might be hidden

## 2019-10-05 NOTE — Progress Notes (Signed)
Pt discharged home by PTAR. Pt stable on discharge and all belonging taking with.

## 2019-10-07 ENCOUNTER — Telehealth: Payer: Self-pay | Admitting: Orthopedic Surgery

## 2019-10-07 NOTE — Telephone Encounter (Signed)
Dr. Lajoyce Corners saw pt in consult in the hospital for osteo of fused knee. Had suggested pt to proceed with AKA and he was not ready for this. Per Dr. Lajoyce Corners to continue with dry dressing change daily and can follow up in the office when he is ready to proceed with surgery. Will call with any other questions.

## 2019-10-07 NOTE — Telephone Encounter (Signed)
Kathie Rhodes from [facility] nursing called. She would like verbal orders for home health and wound care. Her call back number is [phone]

## 2019-10-08 ENCOUNTER — Telehealth (HOSPITAL_COMMUNITY): Payer: Self-pay | Admitting: Pharmacist

## 2019-10-08 ENCOUNTER — Telehealth: Payer: Self-pay

## 2019-10-08 MED ORDER — LINEZOLID 600 MG PO TABS: 600.0000 mg | ORAL_TABLET | Freq: Two times a day (BID) | ORAL | 0 refills | Status: AC

## 2019-10-08 NOTE — Progress Notes (Signed)
[name] was unable to get IV antibiotics at home per communication with [facility]. I spoke with Dr. Drue Second and we will switch him to oral linezolid for about 4 weeks and he will follow-up in the clinic on September 20th.    I sent a prescription to [facility] and they will have it ready tomorrow afternoon. I spoke with patient's significant other, Kathie Rhodes, and relayed this information to her. She will pass along to Mr. Roxan Hockey and assist with obtaining and giving the medication.    Sharin Mons, PharmD, BCPS, BCIDP Infectious Diseases Clinical Pharmacist Phone: [phone]

## 2019-10-08 NOTE — Telephone Encounter (Signed)
Denise with [facility] would like wound care orders for patient and would like to know what the next step would be for the patient?  I did advise her of previous message in patient's chart.  Cb# [phone].  Please advise.  Thank you.

## 2019-10-09 NOTE — Telephone Encounter (Signed)
Called and sw Angelique Blonder and advised that this is just a dry dressing change daily. This is not a skilled need and that the pt will change this himself daily. The pt was supposed to also have a caregiver to help with PIC line abx and not does not so he has been changed over to oral abx coverage and will not have surgery. Advised we are happy to see him if he changes his mind.

## 2019-10-14 ENCOUNTER — Ambulatory Visit: Payer: Medicare Other | Admitting: Internal Medicine

## 2019-10-29 ENCOUNTER — Ambulatory Visit: Payer: Medicare Other | Admitting: Orthopedic Surgery

## 2019-11-04 ENCOUNTER — Ambulatory Visit: Payer: Medicare Other | Admitting: Infectious Diseases

## 2019-11-25 ENCOUNTER — Ambulatory Visit: Payer: Medicare Other | Admitting: Podiatry

## 2019-11-26 ENCOUNTER — Telehealth: Payer: Self-pay

## 2019-11-26 NOTE — Telephone Encounter (Signed)
Please see below and advise. Patient requests phone call from you to discuss surgery. Per chart, you had recommended AKA when seen at hospital as consult.

## 2019-11-26 NOTE — Telephone Encounter (Signed)
Patient would like a call back from Dr. Lajoyce Corners to discuss surgery for his left knee before he makes an appointment.  Cb# [phone].  Please advise.  Thank you.

## 2019-11-26 NOTE — Telephone Encounter (Signed)
I called patient regarding the proposed surgery for an above-the-knee amputation for the infected knee effusion with retained hardware.  Patient states he wants to visit family and would like to return to schedule surgery in about a month.  He will call the office to set up a appointment to be evaluated prior to surgery and we will retain 2 view radiographs of the femur and tibia to incorporate the retained hardware.

## 2020-02-02 ENCOUNTER — Emergency Department (HOSPITAL_COMMUNITY)
Admission: EM | Admit: 2020-02-02 | Discharge: 2020-02-02 | Disposition: A | Discharge status: Home / Self Care | Payer: Medicare Other | Attending: Emergency Medicine | Admitting: Emergency Medicine

## 2020-02-02 ENCOUNTER — Emergency Department (HOSPITAL_COMMUNITY): Payer: Medicare Other

## 2020-02-02 DIAGNOSIS — F17210 Nicotine dependence, cigarettes, uncomplicated: Secondary | ICD-10-CM | POA: Insufficient documentation

## 2020-02-02 DIAGNOSIS — R000 Tachycardia, unspecified: Secondary | ICD-10-CM | POA: Insufficient documentation

## 2020-02-02 DIAGNOSIS — I10 Essential (primary) hypertension: Secondary | ICD-10-CM | POA: Diagnosis not present

## 2020-02-02 DIAGNOSIS — Z20822 Contact with and (suspected) exposure to covid-19: Secondary | ICD-10-CM | POA: Diagnosis not present

## 2020-02-02 DIAGNOSIS — Z79899 Other long term (current) drug therapy: Secondary | ICD-10-CM | POA: Insufficient documentation

## 2020-02-02 DIAGNOSIS — Z720 Tobacco use: Secondary | ICD-10-CM

## 2020-02-02 DIAGNOSIS — J441 Chronic obstructive pulmonary disease with (acute) exacerbation: Secondary | ICD-10-CM

## 2020-02-02 DIAGNOSIS — Z96652 Presence of left artificial knee joint: Secondary | ICD-10-CM | POA: Insufficient documentation

## 2020-02-02 DIAGNOSIS — R0602 Shortness of breath: Secondary | ICD-10-CM | POA: Diagnosis present

## 2020-02-02 LAB — RESP PANEL BY RT-PCR (FLU A&B, COVID) ARPGX2
Influenza A by PCR: NEGATIVE
Influenza B by PCR: NEGATIVE
SARS Coronavirus 2 by RT PCR: NEGATIVE

## 2020-02-02 LAB — CBC WITH DIFFERENTIAL/PLATELET
Abs Immature Granulocytes: 0.01 10*3/uL (ref 0.00–0.07)
Basophils Absolute: 0.1 10*3/uL (ref 0.0–0.1)
Basophils Relative: 1 %
Eosinophils Absolute: 0.3 10*3/uL (ref 0.0–0.5)
Eosinophils Relative: 5 %
HCT: 43.3 % (ref 39.0–52.0)
Hemoglobin: 12.6 g/dL — ABNORMAL LOW (ref 13.0–17.0)
Immature Granulocytes: 0 %
Lymphocytes Relative: 21 %
Lymphs Abs: 1.3 10*3/uL (ref 0.7–4.0)
MCH: 28.2 pg (ref 26.0–34.0)
MCHC: 29.1 g/dL — ABNORMAL LOW (ref 30.0–36.0)
MCV: 96.9 fL (ref 80.0–100.0)
Monocytes Absolute: 0.6 10*3/uL (ref 0.1–1.0)
Monocytes Relative: 10 %
Neutro Abs: 3.8 10*3/uL (ref 1.7–7.7)
Neutrophils Relative %: 63 %
Platelets: 210 10*3/uL (ref 150–400)
RBC: 4.47 MIL/uL (ref 4.22–5.81)
RDW: 17.2 % — ABNORMAL HIGH (ref 11.5–15.5)
WBC: 6 10*3/uL (ref 4.0–10.5)
nRBC: 0 % (ref 0.0–0.2)

## 2020-02-02 LAB — BASIC METABOLIC PANEL
Anion gap: 11 (ref 5–15)
BUN: 9 mg/dL (ref 8–23)
CO2: 28 mmol/L (ref 22–32)
Calcium: 9.7 mg/dL (ref 8.9–10.3)
Chloride: 99 mmol/L (ref 98–111)
Creatinine, Ser: 0.76 mg/dL (ref 0.61–1.24)
GFR, Estimated: >60 mL/min (ref >60)
Glucose, Bld: 96 mg/dL (ref 70–99)
Potassium: 4.2 mmol/L (ref 3.5–5.1)
Sodium: 138 mmol/L (ref 135–145)

## 2020-02-02 LAB — BRAIN NATRIURETIC PEPTIDE: B Natriuretic Peptide: 19.3 pg/mL (ref 0.0–100.0)

## 2020-02-02 MED ORDER — ALBUTEROL SULFATE HFA 108 (90 BASE) MCG/ACT IN AERS
1.0000 | INHALATION_SPRAY | RESPIRATORY_TRACT | Status: DC | PRN
  Administered 2020-02-02: 2 via RESPIRATORY_TRACT
  Filled 2020-02-02: qty 6.7

## 2020-02-02 MED ORDER — ALBUTEROL SULFATE HFA 108 (90 BASE) MCG/ACT IN AERS: 2.0000 | INHALATION_SPRAY | Freq: Four times a day (QID) | RESPIRATORY_TRACT | 1 refills | Status: DC | PRN

## 2020-02-02 MED ORDER — FLUTICASONE-SALMETEROL 250-50 MCG/DOSE IN AEPB: 1.0000 | INHALATION_SPRAY | Freq: Two times a day (BID) | RESPIRATORY_TRACT | 0 refills | Status: DC

## 2020-02-02 MED ORDER — SODIUM CHLORIDE 0.9 % IV SOLN: Freq: Once | INTRAVENOUS | Status: AC

## 2020-02-02 MED ORDER — AEROCHAMBER PLUS FLO-VU LARGE MISC: 1.0000 | Freq: Once | Status: AC

## 2020-02-02 MED ORDER — PREDNISONE 10 MG (21) PO TBPK: ORAL_TABLET | ORAL | 0 refills | Status: DC

## 2020-02-02 MED ORDER — DEXAMETHASONE SODIUM PHOSPHATE 10 MG/ML IJ SOLN
10.0000 mg | Freq: Once | INTRAMUSCULAR | Status: AC
  Administered 2020-02-02: 10 mg via INTRAMUSCULAR
  Filled 2020-02-02: qty 1

## 2020-02-02 MED ORDER — ALBUTEROL (5 MG/ML) CONTINUOUS INHALATION SOLN
10.0000 mg/h | INHALATION_SOLUTION | RESPIRATORY_TRACT | Status: DC
  Administered 2020-02-02: 10 mg/h via RESPIRATORY_TRACT
  Filled 2020-02-02: qty 20

## 2020-02-02 MED ORDER — METHYLPREDNISOLONE SODIUM SUCC 125 MG IJ SOLR
125.0000 mg | Freq: Once | INTRAMUSCULAR | Status: DC
  Filled 2020-02-02: qty 2

## 2020-02-02 MED ORDER — AEROCHAMBER PLUS FLO-VU LARGE MISC
Status: AC
  Administered 2020-02-02: 1
  Filled 2020-02-02: qty 1

## 2020-02-02 MED ORDER — ALBUTEROL SULFATE HFA 108 (90 BASE) MCG/ACT IN AERS: 2.0000 | INHALATION_SPRAY | RESPIRATORY_TRACT | 0 refills | Status: DC | PRN

## 2020-02-02 NOTE — Discharge Instructions (Addendum)
Continue to try to stop smoking.  Use the inhaler with a spacer to make it more effective.

## 2020-02-02 NOTE — ED Notes (Signed)
Social work at bedside to discuss getting patient a ride home

## 2020-02-02 NOTE — ED Notes (Signed)
Patient provided sandwich bag

## 2020-02-02 NOTE — ED Notes (Signed)
Ambulated pt in the hallway. Pt's O2 was a continuous 91%-92%. Pt's HR was 116 and did elevate to 130s with continuous ambulation. Pt stated that he felt "burning" in his chest and stated that ingesting "mustard helps with the burning". Once pt returned to the bed, pt's O2 was 95% and HR was 106. Informed Dr. Particia Nearing.

## 2020-02-02 NOTE — ED Notes (Signed)
Patient ambulated by EDT Tanya. Pts sats remained 91-92% on room, HR stayed between 110-120. Pt did endorse some burning to his chest after arriving back to stretcher. Dr. Particia Nearing made aware.

## 2020-02-02 NOTE — TOC Initial Note (Signed)
Transition of Care [facility]) - Initial/Assessment Note    Patient Details  Name: [name] MRN: [id number] Date of Birth: Feb 01, 1956  [facility]) CM/SW Contact:    Lockie Pares, RN Phone Number: 02/02/2020, 2:23 PM  Clinical Narrative:                 Patient lives at [address] [place], [place] He is requesting transportation. He states he cannot bend his left leg to get into a regular vehicle such as a taxi, unless he is in the front seat. [facility] transport called to see if he could sit in the front seat.  [facility]uy stated they could do this, yet when the vehicle came, they stated they could not. Taxi called for patient by Michel Bickers RNCM       Patient Goals and CMS Choice        Expected Discharge Plan and Services                                                Prior Living Arrangements/Services                       Activities of Daily Living      Permission Sought/Granted                  Emotional Assessment              Admission diagnosis:  Respiratory Distress Patient Active Problem List   Diagnosis Date Noted  . Osteomyelitis of left knee region (HCC)   . 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 Acute exacerbation of COPD with asthma (HCC) 07/14/2019  . Nicotine dependence, cigarettes, uncomplicated 07/14/2019  . Acute bacterial bronchitis 07/14/2019  . Knee osteoarthritis 10/12/2015  . Gram-negative infection   . Surgical wound dehiscence 07/30/2015  . Wound dehiscence, surgical 07/29/2015  . Tobacco abuse 07/29/2015  . 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 Wound dehiscence 07/19/2015  . Rupture of left patellar tendon, open, post-total knee replacement 07/19/2015  . Patellar tendon rupture 07/19/2015  . Primary osteoarthritis of left knee 06/18/2015  . Chronic obstructive airway disease with asthma (HCC) 06/18/2015   . Essential hypertension 06/18/2015  . Special screening for malignant neoplasms, colon 12/11/2012   PCP:  Fleet Contras, MD Pharmacy:   [facility] [phone] - [name], [place] - [address] [place]  [address] Phone: [phone] Fax: [phone]  [facility] - [place], [place] - [address] [place] [address] Phone: [phone] Fax: [phone]     Social Determinants of Health (SDOH) Interventions    Readmission Risk Interventions Readmission Risk Prevention Plan 10/04/2019  Transportation Screening Complete  PCP or Specialist Appt within 5-7 Days Complete  Home Care Screening Complete  Medication Review (RN CM) Complete  Some recent data might be hidden

## 2020-02-02 NOTE — ED Notes (Signed)
This RN spoke with RT Cal regarding CAT, unable to start neb at this time due to pending covid swab. Dr. Particia Nearing made aware
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
## 2020-02-02 NOTE — ED Notes (Signed)
Pt given a urinal.
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
## 2020-02-02 NOTE — ED Triage Notes (Signed)
Pt bib gems from home c/o sob that started last night. Pt denies chest pain. Hx of asthma and COPD. [facility] reports VSS, 100% on room air. Albuterol and Duoneb given PTA.

## 2020-02-02 NOTE — ED Notes (Signed)
Patient verbalized understanding of dc instructions, vss, ambulatory with nad.   

## 2020-02-06 ENCOUNTER — Emergency Department (HOSPITAL_COMMUNITY)
Admission: EM | Admit: 2020-02-06 | Discharge: 2020-02-06 | Disposition: A | Discharge status: Home / Self Care | Payer: Medicare Other | Attending: Emergency Medicine | Admitting: Emergency Medicine

## 2020-02-06 ENCOUNTER — Emergency Department (HOSPITAL_COMMUNITY): Payer: Medicare Other

## 2020-02-06 DIAGNOSIS — F17210 Nicotine dependence, cigarettes, uncomplicated: Secondary | ICD-10-CM | POA: Diagnosis not present

## 2020-02-06 DIAGNOSIS — J45909 Unspecified asthma, uncomplicated: Secondary | ICD-10-CM | POA: Insufficient documentation

## 2020-02-06 DIAGNOSIS — Z79899 Other long term (current) drug therapy: Secondary | ICD-10-CM | POA: Insufficient documentation

## 2020-02-06 DIAGNOSIS — Z96652 Presence of left artificial knee joint: Secondary | ICD-10-CM | POA: Diagnosis not present

## 2020-02-06 DIAGNOSIS — Z20822 Contact with and (suspected) exposure to covid-19: Secondary | ICD-10-CM | POA: Diagnosis not present

## 2020-02-06 DIAGNOSIS — D649 Anemia, unspecified: Secondary | ICD-10-CM | POA: Diagnosis not present

## 2020-02-06 DIAGNOSIS — I10 Essential (primary) hypertension: Secondary | ICD-10-CM | POA: Insufficient documentation

## 2020-02-06 DIAGNOSIS — R0600 Dyspnea, unspecified: Secondary | ICD-10-CM | POA: Diagnosis present

## 2020-02-06 DIAGNOSIS — J441 Chronic obstructive pulmonary disease with (acute) exacerbation: Secondary | ICD-10-CM | POA: Insufficient documentation

## 2020-02-06 LAB — CBC WITH DIFFERENTIAL/PLATELET
Abs Immature Granulocytes: 0.04 10*3/uL (ref 0.00–0.07)
Basophils Absolute: 0 10*3/uL (ref 0.0–0.1)
Basophils Relative: 0 %
Eosinophils Absolute: 0 10*3/uL (ref 0.0–0.5)
Eosinophils Relative: 0 %
HCT: 34.4 % — ABNORMAL LOW (ref 39.0–52.0)
Hemoglobin: 10.7 g/dL — ABNORMAL LOW (ref 13.0–17.0)
Immature Granulocytes: 0 %
Lymphocytes Relative: 3 %
Lymphs Abs: 0.3 10*3/uL — ABNORMAL LOW (ref 0.7–4.0)
MCH: 29.4 pg (ref 26.0–34.0)
MCHC: 31.1 g/dL (ref 30.0–36.0)
MCV: 94.5 fL (ref 80.0–100.0)
Monocytes Absolute: 0.3 10*3/uL (ref 0.1–1.0)
Monocytes Relative: 3 %
Neutro Abs: 8.8 10*3/uL — ABNORMAL HIGH (ref 1.7–7.7)
Neutrophils Relative %: 94 %
Platelets: 208 10*3/uL (ref 150–400)
RBC: 3.64 MIL/uL — ABNORMAL LOW (ref 4.22–5.81)
RDW: 17.5 % — ABNORMAL HIGH (ref 11.5–15.5)
WBC: 9.4 10*3/uL (ref 4.0–10.5)
nRBC: 0 % (ref 0.0–0.2)

## 2020-02-06 LAB — COMPREHENSIVE METABOLIC PANEL
ALT: 16 U/L (ref 0–44)
AST: 18 U/L (ref 15–41)
Albumin: 3.8 g/dL (ref 3.5–5.0)
Alkaline Phosphatase: 73 U/L (ref 38–126)
Anion gap: 10 (ref 5–15)
BUN: 12 mg/dL (ref 8–23)
CO2: 30 mmol/L (ref 22–32)
Calcium: 9.1 mg/dL (ref 8.9–10.3)
Chloride: 99 mmol/L (ref 98–111)
Creatinine, Ser: 0.7 mg/dL (ref 0.61–1.24)
GFR, Estimated: >60 mL/min (ref >60)
Glucose, Bld: 130 mg/dL — ABNORMAL HIGH (ref 70–99)
Potassium: 4.6 mmol/L (ref 3.5–5.1)
Sodium: 139 mmol/L (ref 135–145)
Total Bilirubin: 0.4 mg/dL (ref 0.3–1.2)
Total Protein: 6.7 g/dL (ref 6.5–8.1)

## 2020-02-06 LAB — TROPONIN I (HIGH SENSITIVITY)
Troponin I (High Sensitivity): 7 ng/L (ref <18)
Troponin I (High Sensitivity): 6 ng/L (ref <18)

## 2020-02-06 LAB — RESP PANEL BY RT-PCR (FLU A&B, COVID) ARPGX2
Influenza A by PCR: NEGATIVE
Influenza B by PCR: NEGATIVE
SARS Coronavirus 2 by RT PCR: NEGATIVE

## 2020-02-06 LAB — BRAIN NATRIURETIC PEPTIDE: B Natriuretic Peptide: 44.6 pg/mL (ref 0.0–100.0)

## 2020-02-06 MED ORDER — DOXYCYCLINE HYCLATE 100 MG PO CAPS: 100.0000 mg | ORAL_CAPSULE | Freq: Two times a day (BID) | ORAL | 0 refills | Status: AC

## 2020-02-06 MED ORDER — IPRATROPIUM-ALBUTEROL 0.5-2.5 (3) MG/3ML IN SOLN
3.0000 mL | Freq: Once | RESPIRATORY_TRACT | Status: AC
  Administered 2020-02-06: 3 mL via RESPIRATORY_TRACT
  Filled 2020-02-06: qty 3

## 2020-02-06 MED ORDER — ALBUTEROL SULFATE HFA 108 (90 BASE) MCG/ACT IN AERS: 2.0000 | INHALATION_SPRAY | RESPIRATORY_TRACT | 0 refills | Status: DC | PRN

## 2020-02-06 NOTE — ED Notes (Signed)
Pt to be discharged; requesting assistance with transport home; SW aware.

## 2020-02-06 NOTE — Discharge Instructions (Addendum)
Thank you for choosing [facility] for your care today.  To do: I have included a prescription for an antibiotic, which you should fill and take along with the prednisone and your inhaler. If you do not have a pulmonologist you are established with, you can call the phone number below to get set up with Dr. Delton Coombes who is a local pulmonologist.  It is important to see pulmonology regularly to manage your disease and help with your symptoms of shortness of breath. Please follow up with your primary care doctor in the next few days. If you do not have a PCP you are established with, you can call [phone]  or [phone]  to access [facility] Find A Doctor service. You can also visit [email or web address] Please come back to the Emergency Department if you have shortness of breath, chest pain, confusion/mental status changes, if you have so much nausea/vomiting that you cannot keep down fluids, or if you have any other symptoms that worry you.  Take care. Hope you start feeling better soon.  Corliss Blacker, MD Emergency Medicine

## 2020-02-06 NOTE — ED Notes (Signed)
SW arranged a Benedetto Goad ride home; pt states he does not need O2 for ride home.

## 2020-02-06 NOTE — ED Triage Notes (Signed)
Pt presents w/ c/o worsening SOB and non-productive cough since d/c from this hospital 3 days prior.   Pt with hx of COPD denies fever.
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
## 2020-02-15 ENCOUNTER — Emergency Department (HOSPITAL_COMMUNITY)
Admission: EM | Admit: 2020-02-15 | Discharge: 2020-02-16 | Disposition: A | Discharge status: Home / Self Care | Payer: Medicare Other | Attending: Emergency Medicine | Admitting: Emergency Medicine

## 2020-02-15 ENCOUNTER — Emergency Department (HOSPITAL_COMMUNITY): Payer: Medicare Other

## 2020-02-15 ENCOUNTER — Other Ambulatory Visit: Payer: Self-pay

## 2020-02-15 DIAGNOSIS — Z79899 Other long term (current) drug therapy: Secondary | ICD-10-CM | POA: Insufficient documentation

## 2020-02-15 DIAGNOSIS — J45909 Unspecified asthma, uncomplicated: Secondary | ICD-10-CM | POA: Diagnosis not present

## 2020-02-15 DIAGNOSIS — Z96652 Presence of left artificial knee joint: Secondary | ICD-10-CM | POA: Insufficient documentation

## 2020-02-15 DIAGNOSIS — Z85038 Personal history of other malignant neoplasm of large intestine: Secondary | ICD-10-CM | POA: Diagnosis not present

## 2020-02-15 DIAGNOSIS — F17210 Nicotine dependence, cigarettes, uncomplicated: Secondary | ICD-10-CM | POA: Diagnosis not present

## 2020-02-15 DIAGNOSIS — R059 Cough, unspecified: Secondary | ICD-10-CM | POA: Diagnosis present

## 2020-02-15 DIAGNOSIS — M25562 Pain in left knee: Secondary | ICD-10-CM | POA: Insufficient documentation

## 2020-02-15 DIAGNOSIS — Z7951 Long term (current) use of inhaled steroids: Secondary | ICD-10-CM | POA: Diagnosis not present

## 2020-02-15 DIAGNOSIS — I10 Essential (primary) hypertension: Secondary | ICD-10-CM | POA: Insufficient documentation

## 2020-02-15 DIAGNOSIS — J449 Chronic obstructive pulmonary disease, unspecified: Secondary | ICD-10-CM | POA: Diagnosis not present

## 2020-02-15 DIAGNOSIS — G8929 Other chronic pain: Secondary | ICD-10-CM

## 2020-02-15 LAB — BASIC METABOLIC PANEL
Anion gap: 10 (ref 5–15)
BUN: 11 mg/dL (ref 8–23)
CO2: 30 mmol/L (ref 22–32)
Calcium: 9.4 mg/dL (ref 8.9–10.3)
Chloride: 98 mmol/L (ref 98–111)
Creatinine, Ser: 0.93 mg/dL (ref 0.61–1.24)
GFR, Estimated: >60 mL/min (ref >60)
Glucose, Bld: 105 mg/dL — ABNORMAL HIGH (ref 70–99)
Potassium: 4 mmol/L (ref 3.5–5.1)
Sodium: 138 mmol/L (ref 135–145)

## 2020-02-15 LAB — CBC
HCT: 36.7 % — ABNORMAL LOW (ref 39.0–52.0)
Hemoglobin: 10.9 g/dL — ABNORMAL LOW (ref 13.0–17.0)
MCH: 28.8 pg (ref 26.0–34.0)
MCHC: 29.7 g/dL — ABNORMAL LOW (ref 30.0–36.0)
MCV: 96.8 fL (ref 80.0–100.0)
Platelets: 202 10*3/uL (ref 150–400)
RBC: 3.79 MIL/uL — ABNORMAL LOW (ref 4.22–5.81)
RDW: 17 % — ABNORMAL HIGH (ref 11.5–15.5)
WBC: 12 10*3/uL — ABNORMAL HIGH (ref 4.0–10.5)
nRBC: 0 % (ref 0.0–0.2)

## 2020-02-15 LAB — TROPONIN I (HIGH SENSITIVITY)
Troponin I (High Sensitivity): 6 ng/L (ref <18)
Troponin I (High Sensitivity): 6 ng/L (ref <18)

## 2020-02-15 NOTE — ED Triage Notes (Signed)
Pt presents to ED BIB [facility]. Pt c/o L CP and L leg pain x3w.  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 CP worsens w/ cough and motion. Pt has chronic cough d/t COPD and chronic bronchitis. EMS VS - 130/7 HR - 98 RR - 20 100% 2L home O2

## 2020-02-15 NOTE — ED Notes (Signed)
Updated on wait for treatment room. 

## 2020-02-16 ENCOUNTER — Emergency Department (HOSPITAL_COMMUNITY): Payer: Medicare Other

## 2020-02-16 DIAGNOSIS — J449 Chronic obstructive pulmonary disease, unspecified: Secondary | ICD-10-CM | POA: Diagnosis not present

## 2020-02-16 MED ORDER — OXYCODONE-ACETAMINOPHEN 5-325 MG PO TABS: 1.0000 | ORAL_TABLET | Freq: Four times a day (QID) | ORAL | 0 refills | Status: DC | PRN

## 2020-02-16 MED ORDER — OXYCODONE-ACETAMINOPHEN 5-325 MG PO TABS
1.0000 | ORAL_TABLET | Freq: Once | ORAL | Status: AC
Start: 2020-02-16 — End: 2020-02-16
  Administered 2020-02-16: 1 via ORAL
  Filled 2020-02-16: qty 1

## 2020-02-16 NOTE — ED Notes (Signed)
Pt states wants ride home; SW not available and pt is not eligible for insurance coverage for ride home; able to offer bus ticket to patient.

## 2020-02-16 NOTE — ED Notes (Signed)
Pt transported to xray 
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
## 2020-02-16 NOTE — ED Notes (Signed)
Pt refuses bus ticket; now says he will pay for cab home.

## 2020-02-27 ENCOUNTER — Ambulatory Visit: Payer: Medicare Other | Admitting: Orthopedic Surgery

## 2020-03-09 ENCOUNTER — Telehealth: Payer: Self-pay

## 2020-03-09 NOTE — Telephone Encounter (Signed)
Looked in pt chart and see that Dr. 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 The pt wanted to discuss with family and has not come in tot he office for eval nor had the surgery. I lm on vm to advise that we are happy to se him in the office and discuss whatever concerns he has or if he was wanting to proceed with surgery. To call and make an appt with Dr. Lajoyce Corners.

## 2020-03-09 NOTE — Telephone Encounter (Signed)
Patient would like a call back he stated he is supposed to be receiving a prosthetic leg/knee [phone]

## 2020-03-23 ENCOUNTER — Emergency Department (HOSPITAL_COMMUNITY): Payer: Medicare Other

## 2020-03-23 ENCOUNTER — Encounter (HOSPITAL_COMMUNITY): Payer: Self-pay | Admitting: Emergency Medicine

## 2020-03-23 ENCOUNTER — Emergency Department (HOSPITAL_COMMUNITY)
Admission: EM | Admit: 2020-03-23 | Discharge: 2020-03-24 | Disposition: A | Discharge status: Home / Self Care | Payer: Medicare Other | Attending: Emergency Medicine | Admitting: Emergency Medicine

## 2020-03-23 DIAGNOSIS — S81802A Unspecified open wound, left lower leg, initial encounter: Secondary | ICD-10-CM | POA: Diagnosis not present

## 2020-03-23 DIAGNOSIS — X58XXXA Exposure to other specified factors, initial encounter: Secondary | ICD-10-CM | POA: Insufficient documentation

## 2020-03-23 DIAGNOSIS — F17210 Nicotine dependence, cigarettes, uncomplicated: Secondary | ICD-10-CM | POA: Diagnosis not present

## 2020-03-23 DIAGNOSIS — J449 Chronic obstructive pulmonary disease, unspecified: Secondary | ICD-10-CM | POA: Insufficient documentation

## 2020-03-23 DIAGNOSIS — J45909 Unspecified asthma, uncomplicated: Secondary | ICD-10-CM | POA: Diagnosis not present

## 2020-03-23 DIAGNOSIS — M79605 Pain in left leg: Secondary | ICD-10-CM

## 2020-03-23 DIAGNOSIS — Z79899 Other long term (current) drug therapy: Secondary | ICD-10-CM | POA: Diagnosis not present

## 2020-03-23 DIAGNOSIS — R059 Cough, unspecified: Secondary | ICD-10-CM | POA: Insufficient documentation

## 2020-03-23 DIAGNOSIS — Z96652 Presence of left artificial knee joint: Secondary | ICD-10-CM | POA: Insufficient documentation

## 2020-03-23 DIAGNOSIS — I10 Essential (primary) hypertension: Secondary | ICD-10-CM | POA: Insufficient documentation

## 2020-03-23 DIAGNOSIS — Z7951 Long term (current) use of inhaled steroids: Secondary | ICD-10-CM | POA: Insufficient documentation

## 2020-03-23 DIAGNOSIS — S8992XA Unspecified injury of left lower leg, initial encounter: Secondary | ICD-10-CM | POA: Diagnosis present

## 2020-03-23 LAB — COMPREHENSIVE METABOLIC PANEL
ALT: 13 U/L (ref 0–44)
AST: 17 U/L (ref 15–41)
Albumin: 3.9 g/dL (ref 3.5–5.0)
Alkaline Phosphatase: 106 U/L (ref 38–126)
Anion gap: 11 (ref 5–15)
BUN: 6 mg/dL — ABNORMAL LOW (ref 8–23)
CO2: 30 mmol/L (ref 22–32)
Calcium: 9.9 mg/dL (ref 8.9–10.3)
Chloride: 94 mmol/L — ABNORMAL LOW (ref 98–111)
Creatinine, Ser: 0.67 mg/dL (ref 0.61–1.24)
GFR, Estimated: >60 mL/min (ref >60)
Glucose, Bld: 97 mg/dL (ref 70–99)
Potassium: 4.1 mmol/L (ref 3.5–5.1)
Sodium: 135 mmol/L (ref 135–145)
Total Bilirubin: 0.5 mg/dL (ref 0.3–1.2)
Total Protein: 7.9 g/dL (ref 6.5–8.1)

## 2020-03-23 LAB — CBC WITH DIFFERENTIAL/PLATELET
Abs Immature Granulocytes: 0.02 10*3/uL (ref 0.00–0.07)
Basophils Absolute: 0.1 10*3/uL (ref 0.0–0.1)
Basophils Relative: 1 %
Eosinophils Absolute: 0.3 10*3/uL (ref 0.0–0.5)
Eosinophils Relative: 3 %
HCT: 43.4 % (ref 39.0–52.0)
Hemoglobin: 12.9 g/dL — ABNORMAL LOW (ref 13.0–17.0)
Immature Granulocytes: 0 %
Lymphocytes Relative: 10 %
Lymphs Abs: 1 10*3/uL (ref 0.7–4.0)
MCH: 28.4 pg (ref 26.0–34.0)
MCHC: 29.7 g/dL — ABNORMAL LOW (ref 30.0–36.0)
MCV: 95.6 fL (ref 80.0–100.0)
Monocytes Absolute: 0.6 10*3/uL (ref 0.1–1.0)
Monocytes Relative: 6 %
Neutro Abs: 8 10*3/uL — ABNORMAL HIGH (ref 1.7–7.7)
Neutrophils Relative %: 80 %
Platelets: 341 10*3/uL (ref 150–400)
RBC: 4.54 MIL/uL (ref 4.22–5.81)
RDW: 14.6 % (ref 11.5–15.5)
WBC: 10 10*3/uL (ref 4.0–10.5)
nRBC: 0 % (ref 0.0–0.2)

## 2020-03-23 LAB — URINALYSIS, ROUTINE W REFLEX MICROSCOPIC
Bilirubin Urine: NEGATIVE
Glucose, UA: NEGATIVE mg/dL
Hgb urine dipstick: NEGATIVE
Ketones, ur: NEGATIVE mg/dL
Leukocytes,Ua: NEGATIVE
Nitrite: NEGATIVE
Protein, ur: NEGATIVE mg/dL
Specific Gravity, Urine: 1.02 (ref 1.005–1.030)
pH: 6 (ref 5.0–8.0)

## 2020-03-23 LAB — LACTIC ACID, PLASMA: Lactic Acid, Venous: 2.1 mmol/L (ref 0.5–1.9)

## 2020-03-23 MED ORDER — OXYCODONE-ACETAMINOPHEN 5-325 MG PO TABS
1.0000 | ORAL_TABLET | ORAL | Status: DC | PRN
  Administered 2020-03-23: 1 via ORAL
  Filled 2020-03-23: qty 1

## 2020-03-23 NOTE — ED Triage Notes (Signed)
Pt arrives via [facility] from home for c/o wounds to LLE, endorses purulent and bloody drainage from wounds. Advises he cannot move his leg and that it feels cold to him. Temperature equal bilaterally when palpated. 134/84, HR 104, 18RR, 97% o2 ra, 99.5 temp, 176 CBG. Has cough with white phlegm.  Wears home O2 2L at baseline.

## 2020-03-24 ENCOUNTER — Emergency Department (HOSPITAL_COMMUNITY): Payer: Medicare Other

## 2020-03-24 DIAGNOSIS — S81802A Unspecified open wound, left lower leg, initial encounter: Secondary | ICD-10-CM | POA: Diagnosis not present

## 2020-03-24 MED ORDER — AEROCHAMBER PLUS FLO-VU LARGE MISC
1.0000 | Freq: Once | Status: AC
  Administered 2020-03-24: 1

## 2020-03-24 MED ORDER — OXYCODONE-ACETAMINOPHEN 5-325 MG PO TABS
1.0000 | ORAL_TABLET | Freq: Once | ORAL | Status: AC
  Administered 2020-03-24: 1 via ORAL
  Filled 2020-03-24: qty 1

## 2020-03-24 MED ORDER — DOXYCYCLINE HYCLATE 100 MG PO TABS
100.0000 mg | ORAL_TABLET | Freq: Once | ORAL | Status: AC
  Administered 2020-03-24: 100 mg via ORAL
  Filled 2020-03-24: qty 1

## 2020-03-24 MED ORDER — DOXYCYCLINE HYCLATE 100 MG PO CAPS: 100.0000 mg | ORAL_CAPSULE | Freq: Two times a day (BID) | ORAL | 0 refills | Status: DC

## 2020-03-24 MED ORDER — ALBUTEROL SULFATE HFA 108 (90 BASE) MCG/ACT IN AERS
2.0000 | INHALATION_SPRAY | Freq: Once | RESPIRATORY_TRACT | Status: AC
  Administered 2020-03-24: 2 via RESPIRATORY_TRACT
  Filled 2020-03-24: qty 6.7

## 2020-03-24 NOTE — ED Notes (Signed)
[facility] called @ 0919-per [place], RN called by Marylene Land

## 2020-03-24 NOTE — ED Notes (Signed)
Patient verbalized understanding of dc instructions, vss. Pt transported home via [facility].

## 2020-03-24 NOTE — ED Notes (Signed)
ED secretary to contact [facility] for transport home upon discharge, per Dr. Fredirick Maudlin request.
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
## 2020-03-24 NOTE — ED Notes (Signed)
Kathie Rhodes, gf, [phone] would like to speak to pt

## 2020-03-24 NOTE — ED Notes (Signed)
ED Provider at bedside. 

## 2020-03-24 NOTE — ED Notes (Signed)
PTAR at bedside 

## 2020-03-24 NOTE — ED Notes (Signed)
Patient transported to X-ray 

## 2020-04-09 ENCOUNTER — Ambulatory Visit: Payer: Medicare Other | Admitting: Orthopedic Surgery

## 2020-04-10 ENCOUNTER — Telehealth: Payer: Self-pay | Admitting: Orthopedic Surgery

## 2020-04-10 NOTE — Telephone Encounter (Signed)
Called and LVM asking pt to give me a CB

## 2020-04-10 NOTE — Telephone Encounter (Signed)
-----   Message from Rodena Medin, [place] sent at 04/10/2020  8:28 AM EST ----- Regarding: appt Another NS from yesterday can you please call and see if he can r/s some time next week?

## 2020-04-13 NOTE — Telephone Encounter (Signed)
Noted  

## 2020-04-13 NOTE — Telephone Encounter (Signed)
Called pt again and his caregiver answered his mobile line stating transportation hasn't been answering so as soon as she can secure a ride she will CB to set an appt.

## 2020-05-02 ENCOUNTER — Emergency Department (HOSPITAL_COMMUNITY): Payer: Medicare Other

## 2020-05-02 ENCOUNTER — Emergency Department (HOSPITAL_COMMUNITY)
Admission: EM | Admit: 2020-05-02 | Discharge: 2020-05-02 | Disposition: A | Discharge status: Home / Self Care | Payer: Medicare Other | Attending: Emergency Medicine | Admitting: Emergency Medicine

## 2020-05-02 DIAGNOSIS — Z96652 Presence of left artificial knee joint: Secondary | ICD-10-CM | POA: Diagnosis not present

## 2020-05-02 DIAGNOSIS — J45909 Unspecified asthma, uncomplicated: Secondary | ICD-10-CM | POA: Diagnosis not present

## 2020-05-02 DIAGNOSIS — Z85038 Personal history of other malignant neoplasm of large intestine: Secondary | ICD-10-CM | POA: Insufficient documentation

## 2020-05-02 DIAGNOSIS — E119 Type 2 diabetes mellitus without complications: Secondary | ICD-10-CM | POA: Diagnosis not present

## 2020-05-02 DIAGNOSIS — I10 Essential (primary) hypertension: Secondary | ICD-10-CM | POA: Insufficient documentation

## 2020-05-02 DIAGNOSIS — T148XXA Other injury of unspecified body region, initial encounter: Secondary | ICD-10-CM

## 2020-05-02 DIAGNOSIS — L089 Local infection of the skin and subcutaneous tissue, unspecified: Secondary | ICD-10-CM

## 2020-05-02 DIAGNOSIS — J449 Chronic obstructive pulmonary disease, unspecified: Secondary | ICD-10-CM | POA: Diagnosis not present

## 2020-05-02 DIAGNOSIS — Z79899 Other long term (current) drug therapy: Secondary | ICD-10-CM | POA: Insufficient documentation

## 2020-05-02 LAB — CBC WITH DIFFERENTIAL/PLATELET
Abs Immature Granulocytes: 0.06 10*3/uL (ref 0.00–0.07)
Basophils Absolute: 0 10*3/uL (ref 0.0–0.1)
Basophils Relative: 0 %
Eosinophils Absolute: 0.1 10*3/uL (ref 0.0–0.5)
Eosinophils Relative: 2 %
HCT: 36.8 % — ABNORMAL LOW (ref 39.0–52.0)
Hemoglobin: 11.2 g/dL — ABNORMAL LOW (ref 13.0–17.0)
Immature Granulocytes: 1 %
Lymphocytes Relative: 11 %
Lymphs Abs: 0.9 10*3/uL (ref 0.7–4.0)
MCH: 27.9 pg (ref 26.0–34.0)
MCHC: 30.4 g/dL (ref 30.0–36.0)
MCV: 91.8 fL (ref 80.0–100.0)
Monocytes Absolute: 0.8 10*3/uL (ref 0.1–1.0)
Monocytes Relative: 10 %
Neutro Abs: 6.4 10*3/uL (ref 1.7–7.7)
Neutrophils Relative %: 76 %
Platelets: 321 10*3/uL (ref 150–400)
RBC: 4.01 MIL/uL — ABNORMAL LOW (ref 4.22–5.81)
RDW: 14.8 % (ref 11.5–15.5)
WBC: 8.4 10*3/uL (ref 4.0–10.5)
nRBC: 0 % (ref 0.0–0.2)

## 2020-05-02 LAB — COMPREHENSIVE METABOLIC PANEL
ALT: 13 U/L (ref 0–44)
AST: 13 U/L — ABNORMAL LOW (ref 15–41)
Albumin: 3.6 g/dL (ref 3.5–5.0)
Alkaline Phosphatase: 76 U/L (ref 38–126)
Anion gap: 8 (ref 5–15)
BUN: 10 mg/dL (ref 8–23)
CO2: 31 mmol/L (ref 22–32)
Calcium: 9.9 mg/dL (ref 8.9–10.3)
Chloride: 97 mmol/L — ABNORMAL LOW (ref 98–111)
Creatinine, Ser: 0.64 mg/dL (ref 0.61–1.24)
GFR, Estimated: >60 mL/min (ref >60)
Glucose, Bld: 97 mg/dL (ref 70–99)
Potassium: 4.3 mmol/L (ref 3.5–5.1)
Sodium: 136 mmol/L (ref 135–145)
Total Bilirubin: 0.6 mg/dL (ref 0.3–1.2)
Total Protein: 7.3 g/dL (ref 6.5–8.1)

## 2020-05-02 LAB — LACTIC ACID, PLASMA: Lactic Acid, Venous: 1.3 mmol/L (ref 0.5–1.9)

## 2020-05-02 MED ORDER — DOXYCYCLINE HYCLATE 100 MG PO CAPS: 100.0000 mg | ORAL_CAPSULE | Freq: Two times a day (BID) | ORAL | 0 refills | Status: DC

## 2020-05-02 MED ORDER — DOXYCYCLINE HYCLATE 100 MG PO TABS
100.0000 mg | ORAL_TABLET | Freq: Once | ORAL | Status: AC
  Administered 2020-05-02: 100 mg via ORAL
  Filled 2020-05-02: qty 1

## 2020-05-02 MED ORDER — ALBUTEROL SULFATE HFA 108 (90 BASE) MCG/ACT IN AERS
2.0000 | INHALATION_SPRAY | Freq: Once | RESPIRATORY_TRACT | Status: AC
  Administered 2020-05-02: 2 via RESPIRATORY_TRACT
  Filled 2020-05-02: qty 6.7

## 2020-05-02 NOTE — ED Notes (Signed)
Patient transported to X-ray 

## 2020-05-02 NOTE — ED Triage Notes (Signed)
Pt to triage via [facility] from home.  Reports L leg pain.  Pt had toilet paper and paper towels wrapped around crusted leg.  Pt reports wound x 1 month.  Leg red, swollen, and drainage per EMS.  Leg cleaned and bandaged by EMS.

## 2020-05-02 NOTE — Discharge Instructions (Addendum)
Your testing is normal, your x-ray is normal as far as infection goes, please follow-up with your family doctor for a recheck this week, you will need to follow back up with doctor Lajoyce Corners - call for appointment in the morning  Doxycycline 100mg  by mouth twice daily until the medicine is completely finished - this medicine is a strong antibiotic that treats certain infections.    ER for worsening symptoms

## 2020-05-02 NOTE — ED Notes (Addendum)
Pt requesting ride home, reports unable to walk from wheelchair into back of taxi due to leg/ wound pain

## 2020-05-02 NOTE — ED Provider Notes (Signed)
MSE was initiated and I personally evaluated the patient and placed orders (if any) at  2:47 PM on May 02, 2020. 65 year old male complaining of pain to the left lower extremity.  He has a wound on the left anterior lower leg.  Is unclear how long this has been present.  Is complaining of draining yellow pus and being painful.  Denies fever, chills, nausea, vomiting.  He is a smoker The patient appears stable so that the remainder of the MSE may be completed by another provider.    Margarita Grizzle, MD 05/02/20 [phone]

## 2020-05-02 NOTE — ED Notes (Signed)
Pt now stating willing to take taxi voucher
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
## 2020-05-04 ENCOUNTER — Telehealth: Payer: Self-pay

## 2020-05-04 NOTE — Telephone Encounter (Signed)
I called pt and he has an appt on 05/07/20 @ 9:15 he states that he will try and set up transportation with his daughter. I advised if there is anything that I need to do to please let me know I am happy to move the appt day and time for whatever best works with his schedule.

## 2020-05-04 NOTE — Telephone Encounter (Signed)
Pt called asking to speak with Dr. Lajoyce Corners regarding an apt about a surgery on his leg.   I told the pt that I can make him an apt. He told me he would need to call me back to get a ride, but still insisted on speaking with Dr. Lajoyce Corners

## 2020-05-07 ENCOUNTER — Encounter: Payer: Self-pay | Admitting: Orthopedic Surgery

## 2020-05-07 ENCOUNTER — Ambulatory Visit: Payer: Medicare Other | Admitting: Orthopedic Surgery

## 2020-05-07 ENCOUNTER — Ambulatory Visit (INDEPENDENT_AMBULATORY_CARE_PROVIDER_SITE_OTHER): Payer: Medicare Other | Admitting: Orthopedic Surgery

## 2020-05-07 DIAGNOSIS — T847XXS Infection and inflammatory reaction due to other internal orthopedic prosthetic devices, implants and grafts, sequela: Secondary | ICD-10-CM | POA: Diagnosis not present

## 2020-05-07 DIAGNOSIS — M869 Osteomyelitis, unspecified: Secondary | ICD-10-CM

## 2020-05-08 ENCOUNTER — Encounter: Payer: Self-pay | Admitting: Orthopedic Surgery
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
## 2020-05-15 ENCOUNTER — Other Ambulatory Visit: Payer: Self-pay | Admitting: Physician Assistant

## 2020-05-19 ENCOUNTER — Other Ambulatory Visit (HOSPITAL_COMMUNITY)
Admission: RE | Admit: 2020-05-19 | Discharge: 2020-05-19 | Disposition: A | Discharge status: Home / Self Care | Payer: Medicare Other | Source: Ambulatory Visit | Attending: Orthopedic Surgery | Admitting: Orthopedic Surgery

## 2020-05-19 ENCOUNTER — Other Ambulatory Visit: Payer: Self-pay

## 2020-05-19 DIAGNOSIS — Z20822 Contact with and (suspected) exposure to covid-19: Secondary | ICD-10-CM | POA: Insufficient documentation

## 2020-05-19 DIAGNOSIS — Z01812 Encounter for preprocedural laboratory examination: Secondary | ICD-10-CM | POA: Insufficient documentation

## 2020-05-19 LAB — SARS CORONAVIRUS 2 (TAT 6-24 HRS): SARS Coronavirus 2: NEGATIVE

## 2020-05-21 ENCOUNTER — Other Ambulatory Visit: Payer: Self-pay

## 2020-05-21 ENCOUNTER — Encounter (HOSPITAL_COMMUNITY): Payer: Self-pay | Admitting: Orthopedic Surgery

## 2020-05-22 ENCOUNTER — Inpatient Hospital Stay (HOSPITAL_COMMUNITY): Payer: Medicare Other | Admitting: Certified Registered"

## 2020-05-22 ENCOUNTER — Other Ambulatory Visit: Payer: Self-pay

## 2020-05-22 ENCOUNTER — Inpatient Hospital Stay (HOSPITAL_COMMUNITY)
Admission: RE | Admit: 2020-05-22 | Discharge: 2020-05-27 | DRG: 474 | Disposition: A | Discharge status: Skilled Nursing Facility | Payer: Medicare Other | Attending: Orthopedic Surgery | Admitting: Orthopedic Surgery

## 2020-05-22 ENCOUNTER — Encounter (HOSPITAL_COMMUNITY)
Admission: RE | Disposition: A | Discharge status: Home / Self Care | Payer: Self-pay | Source: Home / Self Care | Attending: Orthopedic Surgery

## 2020-05-22 ENCOUNTER — Encounter (HOSPITAL_COMMUNITY): Payer: Self-pay | Admitting: Orthopedic Surgery

## 2020-05-22 DIAGNOSIS — Z20822 Contact with and (suspected) exposure to covid-19: Secondary | ICD-10-CM | POA: Diagnosis present

## 2020-05-22 DIAGNOSIS — Z7951 Long term (current) use of inhaled steroids: Secondary | ICD-10-CM

## 2020-05-22 DIAGNOSIS — I10 Essential (primary) hypertension: Secondary | ICD-10-CM | POA: Diagnosis present

## 2020-05-22 DIAGNOSIS — Z79899 Other long term (current) drug therapy: Secondary | ICD-10-CM | POA: Diagnosis not present

## 2020-05-22 DIAGNOSIS — Z9981 Dependence on supplemental oxygen: Secondary | ICD-10-CM

## 2020-05-22 DIAGNOSIS — F17210 Nicotine dependence, cigarettes, uncomplicated: Secondary | ICD-10-CM | POA: Diagnosis present

## 2020-05-22 DIAGNOSIS — J9621 Acute and chronic respiratory failure with hypoxia: Secondary | ICD-10-CM | POA: Diagnosis present

## 2020-05-22 DIAGNOSIS — J449 Chronic obstructive pulmonary disease, unspecified: Secondary | ICD-10-CM | POA: Diagnosis present

## 2020-05-22 DIAGNOSIS — U071 COVID-19: Secondary | ICD-10-CM | POA: Diagnosis not present

## 2020-05-22 DIAGNOSIS — M86652 Other chronic osteomyelitis, left thigh: Secondary | ICD-10-CM | POA: Diagnosis present

## 2020-05-22 DIAGNOSIS — Z87820 Personal history of traumatic brain injury: Secondary | ICD-10-CM

## 2020-05-22 DIAGNOSIS — T84621A Infection and inflammatory reaction due to internal fixation device of left femur, initial encounter: Secondary | ICD-10-CM | POA: Diagnosis present

## 2020-05-22 DIAGNOSIS — M86252 Subacute osteomyelitis, left femur: Secondary | ICD-10-CM | POA: Diagnosis not present

## 2020-05-22 DIAGNOSIS — M009 Pyogenic arthritis, unspecified: Secondary | ICD-10-CM

## 2020-05-22 DIAGNOSIS — Y831 Surgical operation with implant of artificial internal device as the cause of abnormal reaction of the patient, or of later complication, without mention of misadventure at the time of the procedure: Secondary | ICD-10-CM | POA: Diagnosis present

## 2020-05-22 DIAGNOSIS — M86172 Other acute osteomyelitis, left ankle and foot: Secondary | ICD-10-CM | POA: Diagnosis present

## 2020-05-22 HISTORY — PX: AMPUTATION: SHX166

## 2020-05-22 HISTORY — PX: HARDWARE REMOVAL: SHX979

## 2020-05-22 LAB — POCT I-STAT, CHEM 8
BUN: 11 mg/dL (ref 8–23)
Calcium, Ion: 1.07 mmol/L — ABNORMAL LOW (ref 1.15–1.40)
Chloride: 101 mmol/L (ref 98–111)
Creatinine, Ser: 0.6 mg/dL — ABNORMAL LOW (ref 0.61–1.24)
Glucose, Bld: 90 mg/dL (ref 70–99)
HCT: 42 % (ref 39.0–52.0)
Hemoglobin: 14.3 g/dL (ref 13.0–17.0)
Potassium: 4.8 mmol/L (ref 3.5–5.1)
Sodium: 133 mmol/L — ABNORMAL LOW (ref 135–145)
TCO2: 26 mmol/L (ref 22–32)

## 2020-05-22 LAB — CBC
HCT: 41.2 % (ref 39.0–52.0)
Hemoglobin: 12.6 g/dL — ABNORMAL LOW (ref 13.0–17.0)
MCH: 27.9 pg (ref 26.0–34.0)
MCHC: 30.6 g/dL (ref 30.0–36.0)
MCV: 91.2 fL (ref 80.0–100.0)
Platelets: 323 10*3/uL (ref 150–400)
RBC: 4.52 MIL/uL (ref 4.22–5.81)
RDW: 15.5 % (ref 11.5–15.5)
WBC: 10 10*3/uL (ref 4.0–10.5)
nRBC: 0 % (ref 0.0–0.2)

## 2020-05-22 LAB — BASIC METABOLIC PANEL
Anion gap: 12 (ref 5–15)
BUN: 9 mg/dL (ref 8–23)
CO2: 26 mmol/L (ref 22–32)
Calcium: 10.1 mg/dL (ref 8.9–10.3)
Chloride: 97 mmol/L — ABNORMAL LOW (ref 98–111)
Creatinine, Ser: 0.72 mg/dL (ref 0.61–1.24)
GFR, Estimated: >60 mL/min (ref >60)
Glucose, Bld: 87 mg/dL (ref 70–99)
Potassium: 4.8 mmol/L (ref 3.5–5.1)
Sodium: 135 mmol/L (ref 135–145)

## 2020-05-22 LAB — TYPE AND SCREEN
ABO/RH(D): AB POS
Antibody Screen: NEGATIVE

## 2020-05-22 SURGERY — REMOVAL, HARDWARE
Anesthesia: Regional | Site: Knee | Laterality: Left

## 2020-05-22 MED ORDER — PHENYLEPHRINE 40 MCG/ML (10ML) SYRINGE FOR IV PUSH (FOR BLOOD PRESSURE SUPPORT)
PREFILLED_SYRINGE | INTRAVENOUS | Status: DC | PRN
  Administered 2020-05-22: 80 ug via INTRAVENOUS

## 2020-05-22 MED ORDER — PROMETHAZINE HCL 25 MG/ML IJ SOLN
INTRAMUSCULAR | Status: AC
  Filled 2020-05-22: qty 1

## 2020-05-22 MED ORDER — KETAMINE HCL 50 MG/5ML IJ SOSY
PREFILLED_SYRINGE | INTRAMUSCULAR | Status: AC
  Filled 2020-05-22: qty 5

## 2020-05-22 MED ORDER — PROMETHAZINE HCL 25 MG/ML IJ SOLN
6.2500 mg | INTRAMUSCULAR | Status: DC | PRN
  Administered 2020-05-22: 12.5 mg via INTRAVENOUS

## 2020-05-22 MED ORDER — HYDRALAZINE HCL 20 MG/ML IJ SOLN: 5.0000 mg | INTRAMUSCULAR | Status: DC | PRN

## 2020-05-22 MED ORDER — ALBUTEROL SULFATE HFA 108 (90 BASE) MCG/ACT IN AERS
2.0000 | INHALATION_SPRAY | RESPIRATORY_TRACT | Status: DC | PRN
  Administered 2020-05-22 – 2020-05-24 (×2): 2 via RESPIRATORY_TRACT
  Filled 2020-05-22: qty 6.7

## 2020-05-22 MED ORDER — FENTANYL CITRATE (PF) 100 MCG/2ML IJ SOLN
INTRAMUSCULAR | Status: DC | PRN
  Administered 2020-05-22: 50 ug via INTRAVENOUS

## 2020-05-22 MED ORDER — ACETAMINOPHEN 500 MG PO TABS
1000.0000 mg | ORAL_TABLET | Freq: Once | ORAL | Status: AC
  Administered 2020-05-22: 1000 mg via ORAL
  Filled 2020-05-22: qty 2

## 2020-05-22 MED ORDER — 0.9 % SODIUM CHLORIDE (POUR BTL) OPTIME
TOPICAL | Status: DC | PRN
  Administered 2020-05-22: 1000 mL

## 2020-05-22 MED ORDER — PROPOFOL 500 MG/50ML IV EMUL
INTRAVENOUS | Status: DC | PRN
  Administered 2020-05-22: 25 ug/kg/min via INTRAVENOUS

## 2020-05-22 MED ORDER — BUPIVACAINE LIPOSOME 1.3 % IJ SUSP
INTRAMUSCULAR | Status: DC | PRN
  Administered 2020-05-22: 10 mL via PERINEURAL

## 2020-05-22 MED ORDER — PHENOL 1.4 % MT LIQD: 1.0000 | OROMUCOSAL | Status: DC | PRN

## 2020-05-22 MED ORDER — ACETAMINOPHEN 325 MG PO TABS
325.0000 mg | ORAL_TABLET | Freq: Four times a day (QID) | ORAL | Status: DC | PRN
  Administered 2020-05-26 (×2): 650 mg via ORAL
  Filled 2020-05-22 (×2): qty 2

## 2020-05-22 MED ORDER — CHLORHEXIDINE GLUCONATE 0.12 % MT SOLN
15.0000 mL | Freq: Once | OROMUCOSAL | Status: AC
  Administered 2020-05-22: 15 mL via OROMUCOSAL
  Filled 2020-05-22: qty 15

## 2020-05-22 MED ORDER — MIDAZOLAM HCL 5 MG/5ML IJ SOLN
INTRAMUSCULAR | Status: DC | PRN
  Administered 2020-05-22: 1 mg via INTRAVENOUS

## 2020-05-22 MED ORDER — HYDROMORPHONE HCL 1 MG/ML IJ SOLN: 0.2500 mg | INTRAMUSCULAR | Status: DC | PRN

## 2020-05-22 MED ORDER — ALUM & MAG HYDROXIDE-SIMETH 200-200-20 MG/5ML PO SUSP: 15.0000 mL | ORAL | Status: DC | PRN

## 2020-05-22 MED ORDER — ROPIVACAINE HCL 5 MG/ML IJ SOLN
INTRAMUSCULAR | Status: DC | PRN
  Administered 2020-05-22: 20 mL via PERINEURAL

## 2020-05-22 MED ORDER — MIDAZOLAM HCL 2 MG/2ML IJ SOLN
INTRAMUSCULAR | Status: AC
  Filled 2020-05-22: qty 2

## 2020-05-22 MED ORDER — POTASSIUM CHLORIDE CRYS ER 20 MEQ PO TBCR: 20.0000 meq | EXTENDED_RELEASE_TABLET | Freq: Every day | ORAL | Status: DC | PRN

## 2020-05-22 MED ORDER — OXYCODONE HCL 5 MG/5ML PO SOLN: 5.0000 mg | Freq: Once | ORAL | Status: DC | PRN

## 2020-05-22 MED ORDER — CEFAZOLIN SODIUM-DEXTROSE 2-4 GM/100ML-% IV SOLN
2.0000 g | INTRAVENOUS | Status: AC
  Administered 2020-05-22: 2 g via INTRAVENOUS
  Filled 2020-05-22: qty 100

## 2020-05-22 MED ORDER — ONDANSETRON HCL 4 MG/2ML IJ SOLN: 4.0000 mg | Freq: Four times a day (QID) | INTRAMUSCULAR | Status: DC | PRN

## 2020-05-22 MED ORDER — TRANEXAMIC ACID-NACL 1000-0.7 MG/100ML-% IV SOLN
INTRAVENOUS | Status: AC
  Filled 2020-05-22: qty 100

## 2020-05-22 MED ORDER — KETOROLAC TROMETHAMINE 30 MG/ML IJ SOLN: 30.0000 mg | Freq: Once | INTRAMUSCULAR | Status: DC | PRN

## 2020-05-22 MED ORDER — TRANEXAMIC ACID-NACL 1000-0.7 MG/100ML-% IV SOLN
1000.0000 mg | Freq: Once | INTRAVENOUS | Status: AC
  Administered 2020-05-22: 1000 mg via INTRAVENOUS
  Filled 2020-05-22: qty 100

## 2020-05-22 MED ORDER — PHENYLEPHRINE 40 MCG/ML (10ML) SYRINGE FOR IV PUSH (FOR BLOOD PRESSURE SUPPORT)
PREFILLED_SYRINGE | INTRAVENOUS | Status: AC
  Filled 2020-05-22: qty 10

## 2020-05-22 MED ORDER — PHENYLEPHRINE HCL-NACL 10-0.9 MG/250ML-% IV SOLN
INTRAVENOUS | Status: DC | PRN
  Administered 2020-05-22: 40 ug/min via INTRAVENOUS

## 2020-05-22 MED ORDER — PHENYLEPHRINE HCL (PRESSORS) 10 MG/ML IV SOLN
INTRAVENOUS | Status: AC
  Filled 2020-05-22: qty 1

## 2020-05-22 MED ORDER — SODIUM CHLORIDE 0.9 % IV SOLN: INTRAVENOUS | Status: DC

## 2020-05-22 MED ORDER — MOMETASONE FURO-FORMOTEROL FUM 200-5 MCG/ACT IN AERO
2.0000 | INHALATION_SPRAY | Freq: Two times a day (BID) | RESPIRATORY_TRACT | Status: DC
  Administered 2020-05-22 – 2020-05-27 (×11): 2 via RESPIRATORY_TRACT
  Filled 2020-05-22: qty 8.8

## 2020-05-22 MED ORDER — LABETALOL HCL 5 MG/ML IV SOLN: 10.0000 mg | INTRAVENOUS | Status: DC | PRN

## 2020-05-22 MED ORDER — LACTATED RINGERS IV SOLN: INTRAVENOUS | Status: DC

## 2020-05-22 MED ORDER — EPHEDRINE 5 MG/ML INJ
INTRAVENOUS | Status: AC
  Filled 2020-05-22: qty 10

## 2020-05-22 MED ORDER — VANCOMYCIN HCL 500 MG IV SOLR
INTRAVENOUS | Status: DC | PRN
  Administered 2020-05-22: 1000 mg

## 2020-05-22 MED ORDER — OXYCODONE HCL 5 MG PO TABS
10.0000 mg | ORAL_TABLET | ORAL | Status: DC | PRN
  Administered 2020-05-22 – 2020-05-27 (×13): 15 mg via ORAL
  Filled 2020-05-22 (×13): qty 3

## 2020-05-22 MED ORDER — LACTATED RINGERS IV SOLN: INTRAVENOUS | Status: DC | PRN

## 2020-05-22 MED ORDER — OXYCODONE HCL 5 MG PO TABS
5.0000 mg | ORAL_TABLET | ORAL | Status: DC | PRN
  Administered 2020-05-22: 10 mg via ORAL
  Filled 2020-05-22: qty 2

## 2020-05-22 MED ORDER — EPHEDRINE SULFATE-NACL 50-0.9 MG/10ML-% IV SOSY
PREFILLED_SYRINGE | INTRAVENOUS | Status: DC | PRN
  Administered 2020-05-22: 5 mg via INTRAVENOUS
  Administered 2020-05-22: 10 mg via INTRAVENOUS

## 2020-05-22 MED ORDER — ORAL CARE MOUTH RINSE: 15.0000 mL | Freq: Once | OROMUCOSAL | Status: AC

## 2020-05-22 MED ORDER — LISINOPRIL 20 MG PO TABS
20.0000 mg | ORAL_TABLET | Freq: Every day | ORAL | Status: DC
  Administered 2020-05-23 – 2020-05-27 (×5): 20 mg via ORAL
  Filled 2020-05-22 (×6): qty 1

## 2020-05-22 MED ORDER — FENTANYL CITRATE (PF) 250 MCG/5ML IJ SOLN
INTRAMUSCULAR | Status: AC
  Filled 2020-05-22: qty 5

## 2020-05-22 MED ORDER — PANTOPRAZOLE SODIUM 40 MG PO TBEC
40.0000 mg | DELAYED_RELEASE_TABLET | Freq: Every day | ORAL | Status: DC
  Administered 2020-05-22 – 2020-05-27 (×6): 40 mg via ORAL
  Filled 2020-05-22 (×6): qty 1

## 2020-05-22 MED ORDER — VITAMIN D (ERGOCALCIFEROL) 1.25 MG (50000 UNIT) PO CAPS
50000.0000 [IU] | ORAL_CAPSULE | ORAL | Status: DC
  Administered 2020-05-22: 50000 [IU] via ORAL
  Filled 2020-05-22: qty 1

## 2020-05-22 MED ORDER — IPRATROPIUM-ALBUTEROL 0.5-2.5 (3) MG/3ML IN SOLN
RESPIRATORY_TRACT | Status: AC
  Administered 2020-05-22: 3 mL via RESPIRATORY_TRACT
  Filled 2020-05-22: qty 3

## 2020-05-22 MED ORDER — DOCUSATE SODIUM 100 MG PO CAPS
100.0000 mg | ORAL_CAPSULE | Freq: Every day | ORAL | Status: DC
  Administered 2020-05-23 – 2020-05-27 (×4): 100 mg via ORAL
  Filled 2020-05-22 (×6): qty 1

## 2020-05-22 MED ORDER — ONDANSETRON HCL 4 MG/2ML IJ SOLN
INTRAMUSCULAR | Status: DC | PRN
  Administered 2020-05-22: 4 mg via INTRAVENOUS

## 2020-05-22 MED ORDER — MEPERIDINE HCL 25 MG/ML IJ SOLN: 6.2500 mg | INTRAMUSCULAR | Status: DC | PRN

## 2020-05-22 MED ORDER — PROPOFOL 10 MG/ML IV BOLUS
INTRAVENOUS | Status: DC | PRN
  Administered 2020-05-22: 20 mg via INTRAVENOUS

## 2020-05-22 MED ORDER — ONDANSETRON HCL 4 MG/2ML IJ SOLN
INTRAMUSCULAR | Status: AC
  Filled 2020-05-22: qty 2

## 2020-05-22 MED ORDER — GENTAMICIN SULFATE 40 MG/ML IJ SOLN
INTRAMUSCULAR | Status: AC
  Filled 2020-05-22: qty 6

## 2020-05-22 MED ORDER — IPRATROPIUM-ALBUTEROL 0.5-2.5 (3) MG/3ML IN SOLN: 3.0000 mL | Freq: Once | RESPIRATORY_TRACT | Status: AC

## 2020-05-22 MED ORDER — ZINC SULFATE 220 (50 ZN) MG PO CAPS
220.0000 mg | ORAL_CAPSULE | Freq: Every day | ORAL | Status: DC
  Administered 2020-05-22 – 2020-05-27 (×6): 220 mg via ORAL
  Filled 2020-05-22 (×6): qty 1

## 2020-05-22 MED ORDER — METOPROLOL TARTRATE 5 MG/5ML IV SOLN: 2.0000 mg | INTRAVENOUS | Status: DC | PRN

## 2020-05-22 MED ORDER — CEFAZOLIN SODIUM-DEXTROSE 1-4 GM/50ML-% IV SOLN
1.0000 g | Freq: Three times a day (TID) | INTRAVENOUS | Status: AC
  Administered 2020-05-22 – 2020-05-23 (×3): 1 g via INTRAVENOUS
  Filled 2020-05-22 (×3): qty 50

## 2020-05-22 MED ORDER — HYDROMORPHONE HCL 1 MG/ML IJ SOLN
0.5000 mg | INTRAMUSCULAR | Status: DC | PRN
Start: 2020-05-22 — End: 2020-05-27
  Administered 2020-05-22: 1 mg via INTRAVENOUS
  Filled 2020-05-22: qty 1

## 2020-05-22 MED ORDER — VANCOMYCIN HCL 1000 MG IV SOLR
INTRAVENOUS | Status: AC
  Filled 2020-05-22: qty 1000

## 2020-05-22 MED ORDER — MAGNESIUM SULFATE 2 GM/50ML IV SOLN
2.0000 g | Freq: Every day | INTRAVENOUS | Status: DC | PRN
  Filled 2020-05-22: qty 50

## 2020-05-22 MED ORDER — TRANEXAMIC ACID-NACL 1000-0.7 MG/100ML-% IV SOLN
INTRAVENOUS | Status: DC | PRN
  Administered 2020-05-22: 1000 mg via INTRAVENOUS

## 2020-05-22 MED ORDER — OXYCODONE HCL 5 MG PO TABS: 5.0000 mg | ORAL_TABLET | Freq: Once | ORAL | Status: DC | PRN

## 2020-05-22 MED ORDER — ASCORBIC ACID 500 MG PO TABS
1000.0000 mg | ORAL_TABLET | Freq: Every day | ORAL | Status: DC
  Administered 2020-05-22 – 2020-05-27 (×6): 1000 mg via ORAL
  Filled 2020-05-22 (×6): qty 2

## 2020-05-22 MED ORDER — BOOST / RESOURCE BREEZE PO LIQD CUSTOM
1.0000 | Freq: Two times a day (BID) | ORAL | Status: DC
  Administered 2020-05-22 – 2020-05-24 (×5): 1 via ORAL

## 2020-05-22 MED ORDER — GUAIFENESIN-DM 100-10 MG/5ML PO SYRP
15.0000 mL | ORAL_SOLUTION | ORAL | Status: DC | PRN
  Administered 2020-05-23 – 2020-05-24 (×3): 15 mL via ORAL
  Filled 2020-05-22 (×3): qty 20

## 2020-05-22 MED ORDER — GENTAMICIN SULFATE 40 MG/ML IJ SOLN
INTRAMUSCULAR | Status: DC | PRN
  Administered 2020-05-22: 240 mg

## 2020-05-22 MED ORDER — PROPOFOL 10 MG/ML IV BOLUS
INTRAVENOUS | Status: AC
  Filled 2020-05-22: qty 20

## 2020-05-22 SURGICAL SUPPLY — 58 items
BLADE SAW RECIP 87.9 MT (BLADE) ×3 IMPLANT
BLADE SURG 21 STRL SS (BLADE) ×3 IMPLANT
BNDG COHESIVE 4X5 TAN STRL (GAUZE/BANDAGES/DRESSINGS) ×3 IMPLANT
BNDG COHESIVE 6X5 TAN STRL LF (GAUZE/BANDAGES/DRESSINGS) ×3 IMPLANT
BNDG GAUZE ELAST 4 BULKY (GAUZE/BANDAGES/DRESSINGS) ×3 IMPLANT
CANISTER WOUND CARE 500ML ATS (WOUND CARE) ×3 IMPLANT
COVER SURGICAL LIGHT HANDLE (MISCELLANEOUS) ×6 IMPLANT
COVER WAND RF STERILE (DRAPES) ×3 IMPLANT
CUFF TOURN SGL QUICK 42 (TOURNIQUET CUFF) IMPLANT
DRAPE INCISE IOBAN 66X45 STRL (DRAPES) ×6 IMPLANT
DRAPE ORTHO SPLIT 77X108 STRL (DRAPES)
DRAPE SURG ORHT 6 SPLT 77X108 (DRAPES) IMPLANT
DRAPE U-SHAPE 47X51 STRL (DRAPES) ×3 IMPLANT
DRESSING PREVENA PLUS CUSTOM (GAUZE/BANDAGES/DRESSINGS) ×2 IMPLANT
DRSG EMULSION OIL 3X3 NADH (GAUZE/BANDAGES/DRESSINGS) ×3 IMPLANT
DRSG PAD ABDOMINAL 8X10 ST (GAUZE/BANDAGES/DRESSINGS) ×3 IMPLANT
DRSG PREVENA PLUS CUSTOM (GAUZE/BANDAGES/DRESSINGS) ×3
DURAPREP 26ML APPLICATOR (WOUND CARE) ×3 IMPLANT
ELECT REM PT RETURN 9FT ADLT (ELECTROSURGICAL) ×3
ELECTRODE REM PT RTRN 9FT ADLT (ELECTROSURGICAL) ×2 IMPLANT
GAUZE SPONGE 4X4 12PLY STRL (GAUZE/BANDAGES/DRESSINGS) ×3 IMPLANT
GLOVE BIOGEL PI IND STRL 7.5 (GLOVE) ×2 IMPLANT
GLOVE BIOGEL PI IND STRL 9 (GLOVE) ×2 IMPLANT
GLOVE BIOGEL PI INDICATOR 7.5 (GLOVE) ×1
GLOVE BIOGEL PI INDICATOR 9 (GLOVE) ×1
GLOVE SURG ORTHO 9.0 STRL STRW (GLOVE) ×3 IMPLANT
GLOVE SURG SS PI 6.5 STRL IVOR (GLOVE) ×3 IMPLANT
GOWN STRL REUS W/ TWL LRG LVL3 (GOWN DISPOSABLE) ×2 IMPLANT
GOWN STRL REUS W/ TWL XL LVL3 (GOWN DISPOSABLE) ×6 IMPLANT
GOWN STRL REUS W/TWL LRG LVL3 (GOWN DISPOSABLE) ×3
GOWN STRL REUS W/TWL XL LVL3 (GOWN DISPOSABLE) ×9
KIT BASIN OR (CUSTOM PROCEDURE TRAY) ×3 IMPLANT
KIT STIMULAN RAPID CURE  10CC (Orthopedic Implant) ×3 IMPLANT
KIT STIMULAN RAPID CURE 10CC (Orthopedic Implant) ×2 IMPLANT
KIT TURNOVER KIT B (KITS) ×3 IMPLANT
MANIFOLD NEPTUNE II (INSTRUMENTS) ×3 IMPLANT
NS IRRIG 1000ML POUR BTL (IV SOLUTION) ×3 IMPLANT
PACK ORTHO EXTREMITY (CUSTOM PROCEDURE TRAY) ×3 IMPLANT
PAD ARMBOARD 7.5X6 YLW CONV (MISCELLANEOUS) ×6 IMPLANT
PREVENA RESTOR ARTHOFORM 46X30 (CANNISTER) ×3 IMPLANT
PREVENA RESTOR AXIOFORM 29X28 (GAUZE/BANDAGES/DRESSINGS) ×3 IMPLANT
SPONGE LAP 18X18 RF (DISPOSABLE) IMPLANT
STAPLER VISISTAT 35W (STAPLE) ×3 IMPLANT
STOCKINETTE IMPERVIOUS 9X36 MD (GAUZE/BANDAGES/DRESSINGS) IMPLANT
STOCKINETTE IMPERVIOUS LG (DRAPES) ×3 IMPLANT
SUT ETHILON 2 0 PSLX (SUTURE) ×6 IMPLANT
SUT SILK 2 0 (SUTURE) ×3
SUT SILK 2-0 18XBRD TIE 12 (SUTURE) ×2 IMPLANT
SUT VIC AB 0 CT1 27 (SUTURE) ×6
SUT VIC AB 0 CT1 27XBRD ANBCTR (SUTURE) ×4 IMPLANT
SUT VIC AB 2-0 CT1 27 (SUTURE) ×3
SUT VIC AB 2-0 CT1 TAPERPNT 27 (SUTURE) ×2 IMPLANT
TOWEL GREEN STERILE (TOWEL DISPOSABLE) ×3 IMPLANT
TOWEL GREEN STERILE FF (TOWEL DISPOSABLE) ×3 IMPLANT
TUBE CONNECTING 20X1/4 (TUBING) ×3 IMPLANT
UNDERPAD 30X36 HEAVY ABSORB (UNDERPADS AND DIAPERS) ×3 IMPLANT
WATER STERILE IRR 1000ML POUR (IV SOLUTION) ×3 IMPLANT
YANKAUER SUCT BULB TIP NO VENT (SUCTIONS) ×3 IMPLANT

## 2020-05-22 NOTE — Transfer of Care (Signed)
Immediate Anesthesia Transfer of Care Note  [name]  Procedure(s) Performed: REMOVAL OF HARDWARE LEFT LEG (Left ) LEFT ABOVE KNEE AMPUTATION (Left Knee)  Patient Location: PACU  Anesthesia Type:MAC and Spinal  Level of Consciousness: awake, alert  and oriented  Airway & Oxygen Therapy: Patient Spontanous Breathing  Post-op Assessment: Report given to RN and Post -op Vital signs reviewed and stable  Post vital signs: Reviewed and stable  Last Vitals:  Vitals Value Taken Time  BP 82/55 05/22/20 0906  Temp    Pulse 89 05/22/20 0910  Resp 23 05/22/20 0910  SpO2 100 % 05/22/20 0910  Vitals shown include unvalidated device data.  Last Pain:  Vitals:   05/22/20 0704  TempSrc:   PainSc: 10-Worst pain ever         Complications: No complications documented.

## 2020-05-22 NOTE — Plan of Care (Signed)
  Problem: Pain Managment: Goal: General experience of comfort will improve Outcome: Progressing   Problem: Safety: Goal: Ability to remain free from injury will improve Outcome: Progressing   Problem: Nutrition: Goal: Adequate nutrition will be maintained Outcome: Progressing   

## 2020-05-22 NOTE — Progress Notes (Signed)
Received patient from PACU, pt is alert and oriented x4, sleepy. No pain or discomfort. VS table.

## 2020-05-22 NOTE — Interval H&P Note (Signed)
History and Physical Interval Note:  05/22/2020 6:49 AM  [name]  has presented today for surgery, with the diagnosis of Left Leg Chronic Osteomyelitis.  The various methods of treatment have been discussed with the patient and family. After consideration of risks, benefits and other options for treatment, the patient has consented to  Procedure(s): REMOVAL OF HARDWARE LEFT LEG (Left) LEFT ABOVE KNEE AMPUTATION (Left) as a surgical intervention.  The patient's history has been reviewed, patient examined, no change in status, stable for surgery.  I have reviewed the patient's chart and labs.  Questions were answered to the patient's satisfaction.     Nadara Mustard

## 2020-05-22 NOTE — Anesthesia Postprocedure Evaluation (Signed)
Anesthesia Post Note  [name]  Procedure(s) Performed: REMOVAL OF HARDWARE LEFT LEG (Left ) LEFT ABOVE KNEE AMPUTATION (Left Knee)     Patient location during evaluation: PACU Anesthesia Type: Regional and Spinal Level of consciousness: awake and alert, oriented and patient cooperative Pain management: pain level controlled Vital Signs Assessment: post-procedure vital signs reviewed and stable Respiratory status: spontaneous breathing, nonlabored ventilation and respiratory function stable Cardiovascular status: blood pressure returned to baseline and stable Postop Assessment: no apparent nausea or vomiting, no headache, no backache and spinal receding Anesthetic complications: no   No complications documented.  Last Vitals:  Vitals:   05/22/20 1104 05/22/20 1134  BP: 97/72 102/75  Pulse: 63 60  Resp: (!) 25 15  Temp:  (!) 36.4 C  SpO2: 100% 98%    Last Pain:  Vitals:   05/22/20 1134  TempSrc: Axillary  PainSc: 0-No pain                 Lannie Fields

## 2020-05-22 NOTE — Anesthesia Procedure Notes (Signed)
Spinal  Patient location during procedure: OR Start time: 05/22/2020 7:58 AM End time: 05/22/2020 8:01 AM Reason for block: surgical anesthesia Staffing Performed: anesthesiologist  Anesthesiologist: Lannie Fields, DO Preanesthetic Checklist Completed: patient identified, IV checked, risks and benefits discussed, surgical consent, monitors and equipment checked, pre-op evaluation and timeout performed Spinal Block Patient position: sitting Prep: DuraPrep and site prepped and draped Patient monitoring: cardiac monitor, continuous pulse ox and blood pressure Approach: midline Location: L3-4 Injection technique: single-shot Needle Needle type: Pencan  Needle gauge: 24 G Needle length: 9 cm Assessment Sensory level: T6 Events: CSF return Additional Notes Functioning IV was confirmed and monitors were applied. Sterile prep and drape, including hand hygiene and sterile gloves were used. The patient was positioned and the spine was prepped. The skin was anesthetized with lidocaine.  Free flow of clear CSF was obtained prior to injecting local anesthetic into the CSF.  The spinal needle aspirated freely following injection.  The needle was carefully withdrawn.  The patient tolerated the procedure well.

## 2020-05-22 NOTE — Interval H&P Note (Signed)
History and Physical Interval Note:  05/22/2020 11:24 AM  [name]  has presented today for surgery, with the diagnosis of Left Leg Chronic Osteomyelitis.  The various methods of treatment have been discussed with the patient and family. After consideration of risks, benefits and other options for treatment, the patient has consented to  Procedure(s): REMOVAL OF HARDWARE LEFT LEG (Left) LEFT ABOVE KNEE AMPUTATION (Left) as a surgical intervention.  The patient's history has been reviewed, patient examined, no change in status, stable for surgery.  I have reviewed the patient's chart and labs.  Questions were answered to the patient's satisfaction.     Nadara Mustard

## 2020-05-22 NOTE — Anesthesia Preprocedure Evaluation (Addendum)
Anesthesia Evaluation  Patient identified by MRN, date of birth, ID band Patient awake    Reviewed: Allergy & Precautions, NPO status , Patient's Chart, lab work & pertinent test results  Airway Mallampati: I  TM Distance: >3 FB Neck ROM: Full    Dental  (+) Edentulous Upper, Partial Lower   Pulmonary shortness of breath and with exertion, asthma , COPD (oxygen qhs),  oxygen dependent, Current Smoker,  50 pack year history, 1ppd currently x 50 years  2LPM [place] oxygen at night   Pulmonary exam normal breath sounds clear to auscultation       Cardiovascular hypertension, Pt. on medications Normal cardiovascular exam Rhythm:Regular Rate:Normal     Neuro/Psych Seizures - (related to [employer], remote past), Well Controlled,  negative psych ROS   GI/Hepatic GERD  Controlled and Medicated,(+)     substance abuse (1-2 cans of beer per day, crack cocaine last week)  alcohol use and cocaine use,   Endo/Other  negative endocrine ROS  Renal/GU negative Renal ROS  negative genitourinary   Musculoskeletal  (+) Arthritis , Osteoarthritis,  LLE chronic osteo   Abdominal   Peds  Hematology negative hematology ROS (+)   Anesthesia Other Findings   Reproductive/Obstetrics negative OB ROS                            Anesthesia Physical Anesthesia Plan  ASA: III  Anesthesia Plan: General and Regional   Post-op Pain Management: GA combined w/ Regional for post-op pain   Induction: Intravenous  PONV Risk Score and Plan: Ondansetron, Dexamethasone and Treatment may vary due to age or medical condition  Airway Management Planned: LMA  Additional Equipment: None  Intra-op Plan:   Post-operative Plan: Extubation in OR  Informed Consent:   Plan Discussed with:   Anesthesia Plan Comments:         Anesthesia Quick Evaluation

## 2020-05-22 NOTE — Op Note (Signed)
05/22/2020  9:13 AM  PATIENT:  [name]    PRE-OPERATIVE DIAGNOSIS:  Left Leg Chronic Osteomyelitis  POST-OPERATIVE DIAGNOSIS:  Same  PROCEDURE:  REMOVAL OF DEEP KNEE FUSION NAIL HARDWARE LEFT LEG,  LEFT ABOVE KNEE AMPUTATION Placement of antibiotic beads stimulant 10 cc with 1 g vancomycin and 240 mg gentamicin. Application of Prevena customizable and [place] Border form wound VAC.  SURGEON:  Nadara Mustard, MD  PHYSICIAN ASSISTANT:None ANESTHESIA:   General  PREOPERATIVE INDICATIONS:  [name] is a  65 y.o. male with a diagnosis of Left Leg Chronic Osteomyelitis who failed conservative measures and elected for surgical management.    The risks benefits and alternatives were discussed with the patient preoperatively including but not limited to the risks of infection, bleeding, nerve injury, cardiopulmonary complications, the need for revision surgery, among others, and the patient was willing to proceed.  OPERATIVE IMPLANTS: Stimulant antibiotic beads with 1 g vancomycin 240 mg gentamicin  @ENCIMAGES @  OPERATIVE FINDINGS: No abscess or purulence at the level of amputation.  The femoral cortical bone had normal appearance no lytic or destructive changes.  OPERATIVE PROCEDURE: Patient was brought the operating room and underwent a spinal anesthetic after regional block.  After adequate levels anesthesia were obtained patient's left lower extremity was prepped using DuraPrep draped into a sterile field a timeout was called.  The infected knee was draped out with impervious stockinette.  A fishmouth incision was made just proximal to the previous fusion incision.  This was carried down through the intermuscular septum medially the vascular bundle was clamped and suture ligated with 2-0 silk.  The remainder of the fishmouth amputation was completed.  A reciprocating saw was used to perform an amputation through the femur this was carried down to the implant.  The Rem B with a  high-speed bur was used to make a longitudinal window in the femur to allow the cement to be removed and the intramedullary fusion nail to be removed without complications.  This was irrigated with normal saline.  The femoral canal was packed open with the stimulant beads 1 g vancomycin 240 mg of gentamicin.  The fascial layer was closed using #1 Vicryl skin with was closed using nylon and staples a Praveena customizable and arthroform wound VAC was applied outlined with derma tack this had a good suction fit patient was taken the PACU in stable condition   DISCHARGE PLANNING:  Antibiotic duration: 24 hours antibiotics IV  Weightbearing: Nonweightbearing on the left  Pain medication: Opioid pathway  Dressing care/ Wound VAC: Continue wound VAC for 1 week  Ambulatory devices: Walker  Discharge to: Anticipate discharge to skilled nursing.  Follow-up: In the office 1 week post operative.
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
## 2020-05-22 NOTE — Anesthesia Procedure Notes (Signed)
Anesthesia Regional Block: Femoral nerve block   Pre-Anesthetic Checklist: ,, timeout performed, Correct Patient, Correct Site, Correct Laterality, Correct Procedure, Correct Position, site marked, Risks and benefits discussed,  Surgical consent,  Pre-op evaluation,  At surgeon's request and post-op pain management  Laterality: Left  Prep: Maximum Sterile Barrier Precautions used, chloraprep       Needles:  Injection technique: Single-shot  Needle Type: Echogenic Stimulator Needle     Needle Length: 9cm  Needle Gauge: 22     Additional Needles:   Procedures:,,,, ultrasound used (permanent image in chart),,,,  Narrative:  Start time: 05/22/2020 7:40 AM End time: 05/22/2020 7:45 AM Injection made incrementally with aspirations every 5 mL.  Performed by: Personally  Anesthesiologist: Lannie Fields, DO  Additional Notes: Monitors applied. No increased pain on injection. No increased resistance to injection. Injection made in 5cc increments. Good needle visualization. Patient tolerated procedure well.

## 2020-05-23 ENCOUNTER — Encounter (HOSPITAL_COMMUNITY): Payer: Self-pay | Admitting: Orthopedic Surgery

## 2020-05-23 LAB — CBC
HCT: 33.8 % — ABNORMAL LOW (ref 39.0–52.0)
Hemoglobin: 10.6 g/dL — ABNORMAL LOW (ref 13.0–17.0)
MCH: 28 pg (ref 26.0–34.0)
MCHC: 31.4 g/dL (ref 30.0–36.0)
MCV: 89.2 fL (ref 80.0–100.0)
Platelets: 313 10*3/uL (ref 150–400)
RBC: 3.79 MIL/uL — ABNORMAL LOW (ref 4.22–5.81)
RDW: 15.1 % (ref 11.5–15.5)
WBC: 19.1 10*3/uL — ABNORMAL HIGH (ref 4.0–10.5)
nRBC: 0 % (ref 0.0–0.2)

## 2020-05-23 LAB — BASIC METABOLIC PANEL
Anion gap: 8 (ref 5–15)
BUN: 11 mg/dL (ref 8–23)
CO2: 29 mmol/L (ref 22–32)
Calcium: 9.4 mg/dL (ref 8.9–10.3)
Chloride: 95 mmol/L — ABNORMAL LOW (ref 98–111)
Creatinine, Ser: 0.72 mg/dL (ref 0.61–1.24)
GFR, Estimated: >60 mL/min (ref >60)
Glucose, Bld: 156 mg/dL — ABNORMAL HIGH (ref 70–99)
Potassium: 4.4 mmol/L (ref 3.5–5.1)
Sodium: 132 mmol/L — ABNORMAL LOW (ref 135–145)

## 2020-05-23 MED ORDER — CHLORHEXIDINE GLUCONATE CLOTH 2 % EX PADS
6.0000 | MEDICATED_PAD | Freq: Every day | CUTANEOUS | Status: DC
  Administered 2020-05-23 – 2020-05-27 (×4): 6 via TOPICAL

## 2020-05-23 NOTE — Progress Notes (Signed)
Patient ID: [name], male   DOB: 03-18-1955, 65 y.o.   MRN: [id number] Patient is postoperative day 1 left above-the-knee amputation with removal of the fusion nail.  He has no complaints there is no drainage in the wound VAC canister plan for discharge to skilled nursing.  I will contact his daughter this morning.

## 2020-05-23 NOTE — Progress Notes (Signed)
Inpatient Rehab Admissions Coordinator:   CIR consult received. PT and OT are recommending SNF and current payor trends with [facility] Medicare indicate that they will not approve CIR admission for amputations. I discussed rehab with Pt. And he stated that he would like to go to the SNF that he went to before but could not recall the name. He states his daughter knows the name of the facility. I reached out to case manager and asked her to call Pt.'s daughter. CIR will sign off.   Megan Salon, MS, CCC-SLP Rehab Admissions Coordinator  [phone] (celll) [phone] (office)
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
## 2020-05-24 LAB — BASIC METABOLIC PANEL
Anion gap: 4 — ABNORMAL LOW (ref 5–15)
BUN: 8 mg/dL (ref 8–23)
CO2: 30 mmol/L (ref 22–32)
Calcium: 9 mg/dL (ref 8.9–10.3)
Chloride: 100 mmol/L (ref 98–111)
Creatinine, Ser: 0.53 mg/dL — ABNORMAL LOW (ref 0.61–1.24)
GFR, Estimated: >60 mL/min (ref >60)
Glucose, Bld: 120 mg/dL — ABNORMAL HIGH (ref 70–99)
Potassium: 4 mmol/L (ref 3.5–5.1)
Sodium: 134 mmol/L — ABNORMAL LOW (ref 135–145)

## 2020-05-24 LAB — CBC
HCT: 30.7 % — ABNORMAL LOW (ref 39.0–52.0)
Hemoglobin: 9.6 g/dL — ABNORMAL LOW (ref 13.0–17.0)
MCH: 28.3 pg (ref 26.0–34.0)
MCHC: 31.3 g/dL (ref 30.0–36.0)
MCV: 90.6 fL (ref 80.0–100.0)
Platelets: 250 10*3/uL (ref 150–400)
RBC: 3.39 MIL/uL — ABNORMAL LOW (ref 4.22–5.81)
RDW: 15.5 % (ref 11.5–15.5)
WBC: 10.7 10*3/uL — ABNORMAL HIGH (ref 4.0–10.5)
nRBC: 0 % (ref 0.0–0.2)
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
## 2020-05-24 NOTE — TOC Initial Note (Signed)
Transition of Care [facility]) - Initial/Assessment Note    Patient Details  Name: [name] MRN: [id number] Date of Birth: 12-31-1955  Transition of Care [facility]) CM/SW Contact:    Gabrielle Dare Phone Number: 05/24/2020, 12:42 PM  Clinical Narrative:                 CSW met with pt at bedside.  CSW introduce self and explained role.  Pt lives in a single story home with his significant other for a year.  Pt is agreeable to SNF for rehab.  Pt has not received covid immunization.  Pt is not interested in receiving the shots.  Pt would like to go to [facility] since he has been there in the past.  Pt gave CSW verbal permission to send out in hub.  TOC will continue to assist with disposition planning.    Expected Discharge Plan: Skilled Nursing Facility Barriers to Discharge: Insurance Authorization,Continued Medical Work up,SNF Pending bed offer   Patient Goals and CMS Choice Patient states their goals for this hospitalization and ongoing recovery are:: "To be able to walk again" CMS Medicare.gov Compare Post Acute Care list provided to:: Other (Comment Required) (Web address for medicare.gov was given to daughter Cecilio Asper) Choice offered to / list presented to : Adult Children (Web address for [employer].gov was given to daughter)  Expected Discharge Plan and Services Expected Discharge Plan: [place] In-house Referral: Clinical Social Work     Living arrangements for the past 2 months: Single Family Home                                      Prior Living Arrangements/Services Living arrangements for the past 2 months: Single Family Home Lives with:: Significant Other Patient language and need for interpreter reviewed:: Yes Do you feel safe going back to the place where you live?: Yes      Need for Family Participation in Patient Care: Yes (Comment) Care giver support system in place?: Yes (comment)   Criminal Activity/Legal Involvement Pertinent to  Current Situation/Hospitalization: No - Comment as needed  Activities of Daily Living Home Assistive Devices/Equipment: Eyeglasses,Wheelchair ADL Screening (condition at time of admission) Patient's cognitive ability adequate to safely complete daily activities?: Yes Is the patient deaf or have difficulty hearing?: No Does the patient have difficulty seeing, even when wearing glasses/contacts?: No Does the patient have difficulty concentrating, remembering, or making decisions?: No Patient able to express need for assistance with ADLs?: Yes Does the patient have difficulty dressing or bathing?: No Independently performs ADLs?: Yes (appropriate for developmental age) Does the patient have difficulty walking or climbing stairs?: No Weakness of Legs: Left Weakness of Arms/Hands: None  Permission Sought/Granted Permission sought to share information with : [place] granted to share information with : Yes, Verbal Permission Granted  Share Information with NAME: [name] [phone]  Permission granted to share info w AGENCY: yes  Permission granted to share info w Relationship: Daughter  Permission granted to share info w Contact Information: yes  Emotional Assessment Appearance:: Appears stated age Attitude/Demeanor/Rapport: Gracious,Engaged Affect (typically observed): Accepting,Pleasant,Appropriate Orientation: : Oriented to Self,Oriented to Place,Oriented to  Time,Oriented to Situation Alcohol / Substance Use: Not Applicable Psych Involvement: No (comment)  Admission diagnosis:  Acute osteomyelitis of left foot [facility]) [A41.660] Patient Active Problem List   Diagnosis Date Noted  . Acute osteomyelitis of  left foot ([place]) 05/22/2020  . Subacute osteomyelitis of left femur ([place])   . Pyogenic arthritis of left knee joint ([place])   . Osteomyelitis of left knee region ([place])   . 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 Acute exacerbation of COPD with asthma ([place]) 07/14/2019  . Nicotine dependence, cigarettes, uncomplicated 16/83/7290  . Acute bacterial bronchitis 07/14/2019  . Knee osteoarthritis 10/12/2015  . Gram-negative infection   . Surgical wound dehiscence 07/30/2015  . Wound dehiscence, surgical 07/29/2015  . Tobacco abuse 07/29/2015  . 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 Wound dehiscence 07/19/2015  . Rupture of left patellar tendon, open, post-total knee replacement 07/19/2015  . Patellar tendon rupture 07/19/2015  . Primary osteoarthritis of left knee 06/18/2015  . Chronic obstructive airway disease with asthma ([place]) 06/18/2015  . Essential hypertension 06/18/2015  . Special screening for malignant neoplasms, colon 12/11/2012   PCP:  Nolene Ebbs, MD Pharmacy:   [place], [place] - [address] [place] [address] Phone: [phone] Fax: [phone]  [place], [place] - [address] [place] [address] Phone: [phone] Fax: [phone]     Social Determinants of Health (SDOH) Interventions    Readmission Risk Interventions Readmission Risk Prevention Plan 10/04/2019  Transportation Screening Complete  PCP or Specialist Appt within 5-7 Days Complete  Home Care Screening Complete  Medication Review (RN CM) Complete  Some recent data might be hidden

## 2020-05-24 NOTE — Plan of Care (Signed)
  Problem: Education: Goal: Knowledge of General Education information will improve Description: Including pain rating scale, medication(s)/side effects and non-pharmacologic comfort measures Outcome: Progressing   Problem: Health Behavior/Discharge Planning: Goal: Ability to manage health-related needs will improve Outcome: Progressing   Problem: Nutrition: Goal: Adequate nutrition will be maintained Outcome: Progressing   Problem: Elimination: Goal: Will not experience complications related to urinary retention Outcome: Progressing   Problem: Activity: Goal: Risk for activity intolerance will decrease Outcome: Not Progressing   Problem: Pain Managment: Goal: General experience of comfort will improve Outcome: Not Progressing

## 2020-05-24 NOTE — NC FL2 (Addendum)
[place] MEDICAID FL2 LEVEL OF CARE SCREENING TOOL     IDENTIFICATION  Patient Name: [name] Birthdate: 04-16-1955 Sex: male Admission Date (Current Location): 05/22/2020  [facility] and [place] Number:  Producer, television/film/video and Address:  [facility] [place], [address] [place], [place] [address]      Provider Number: [id number]  Attending Physician Name and Address:  Nadara Mustard, MD  Relative Name and Phone Number:       Current Level of Care: Hospital Recommended Level of Care: Skilled Nursing Facility Prior Approval Number:    Date Approved/Denied:   PASRR Number: [id number]  Discharge Plan: SNF    Current Diagnoses: Patient Active Problem List   Diagnosis Date Noted  . Acute osteomyelitis of left foot ([facility]) 05/22/2020  . Subacute osteomyelitis of left femur ([facility])   . Pyogenic arthritis of left knee joint ([facility])   . Osteomyelitis of left knee region ([facility])   . 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 Acute exacerbation of COPD with asthma ([facility]) 07/14/2019  . Nicotine dependence, cigarettes, uncomplicated 07/14/2019  . Acute bacterial bronchitis 07/14/2019  . Knee osteoarthritis 10/12/2015  . Gram-negative infection   . Surgical wound dehiscence 07/30/2015  . Wound dehiscence, surgical 07/29/2015  . Tobacco abuse 07/29/2015  . 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 Wound dehiscence 07/19/2015  . Rupture of left patellar tendon, open, post-total knee replacement 07/19/2015  . Patellar tendon rupture 07/19/2015  . Primary osteoarthritis of left knee 06/18/2015  . Chronic obstructive airway disease with asthma ([facility]) 06/18/2015  . Essential hypertension 06/18/2015  . Special screening for malignant neoplasms, colon 12/11/2012    Orientation RESPIRATION BLADDER Height & Weight     Self,Time,Situation,Place  O2 Continent Weight: 198 lb 13.7 oz (90.2 kg) Height:   5\' 6"  (167.6 cm)  BEHAVIORAL SYMPTOMS/MOOD NEUROLOGICAL BOWEL NUTRITION STATUS      Continent Diet (Please see discharge summary)  AMBULATORY STATUS COMMUNICATION OF NEEDS Skin   Supervision Verbally Surgical wounds (closed incision Lft Leg,Negative Pressure Wound Therapy Thigh Anterior,Distal Left)                       Personal Care Assistance Level of Assistance  Bathing,Feeding,Dressing Bathing Assistance: Limited assistance Feeding assistance: Independent Dressing Assistance: Limited assistance     Functional Limitations Info  Sight,Hearing,Speech Sight Info: Adequate Hearing Info: Adequate Speech Info: Adequate    SPECIAL CARE FACTORS FREQUENCY  PT (By licensed PT),OT (By licensed OT)     PT Frequency: 5x per week OT Frequency: 5x per week            Contractures Contractures Info: Not present    Additional Factors Info  Code Status,Allergies,Isolation Precautions Code Status Info: FULL Allergies Info: NKA     Isolation Precautions Info: MRSA     Current Medications (05/24/2020):  This is the current hospital active medication list Current Facility-Administered Medications  Medication Dose Route Frequency Provider Last Rate Last Admin  . 0.9 %  sodium chloride infusion   Intravenous Continuous Persons, 07/24/2020, [place]   Stopped at 05/22/20 2144  . acetaminophen (TYLENOL) tablet 325-650 mg  325-650 mg Oral Q6H PRN Persons, 2145, PA      . albuterol (VENTOLIN HFA) 108 (90 Base) MCG/ACT inhaler 2 puff  2 puff Inhalation Q4H PRN Persons, [place], PA   2 puff at 05/24/20 0845  . alum &  mag hydroxide-simeth (MAALOX/MYLANTA) 200-200-20 MG/5ML suspension 15-30 mL  15-30 mL Oral Q2H PRN Persons, [place], [place]      . ascorbic acid (VITAMIN C) tablet 1,000 mg  1,000 mg Oral Daily Persons, [place], PA   1,000 mg at 05/24/20 0815  . Chlorhexidine Gluconate Cloth 2 % PADS 6 each  6 each Topical Daily Nadara Mustard, MD   6 each at 05/23/20 1233  . docusate  sodium (COLACE) capsule 100 mg  100 mg Oral Daily Persons, [place], [place]   100 mg at 05/24/20 0815  . feeding supplement (BOOST / RESOURCE BREEZE) liquid 1 Container  1 Container Oral BID BM Persons, [place], [place]   1 Container at 05/24/20 [id number]  . guaiFENesin-dextromethorphan (ROBITUSSIN DM) 100-10 MG/5ML syrup 15 mL  15 mL Oral Q4H PRN Persons, [place], PA   15 mL at 05/24/20 0009  . hydrALAZINE (APRESOLINE) injection 5 mg  5 mg Intravenous Q20 Min PRN Persons, [place], [place]      . HYDROmorphone (DILAUDID) injection 0.5-1 mg  0.5-1 mg Intravenous Q4H PRN Persons, [place], PA   1 mg at 05/22/20 1849  . labetalol (NORMODYNE) injection 10 mg  10 mg Intravenous Q10 min PRN Persons, [place], PA      . lisinopril (ZESTRIL) tablet 20 mg  20 mg Oral Q breakfast Persons, [place], PA   20 mg at 05/24/20 0815  . magnesium sulfate IVPB 2 g 50 mL  2 g Intravenous Daily PRN Persons, [place], PA      . metoprolol tartrate (LOPRESSOR) injection 2-5 mg  2-5 mg Intravenous Q2H PRN Persons, [place], PA      . mometasone-formoterol (DULERA) 200-5 MCG/ACT inhaler 2 puff  2 puff Inhalation BID Persons, [place], PA   2 puff at 05/24/20 0845  . ondansetron (ZOFRAN) injection 4 mg  4 mg Intravenous Q6H PRN Persons, [place], PA      . oxyCODONE (Oxy IR/ROXICODONE) immediate release tablet 10-15 mg  10-15 mg Oral Q4H PRN Persons, [place], PA   15 mg at 05/24/20 0815  . oxyCODONE (Oxy IR/ROXICODONE) immediate release tablet 5-10 mg  5-10 mg Oral Q4H PRN Persons, [place], PA   10 mg at 05/22/20 1626  . pantoprazole (PROTONIX) EC tablet 40 mg  40 mg Oral Daily Persons, [place], PA   40 mg at 05/24/20 0815  . phenol (CHLORASEPTIC) mouth spray 1 spray  1 spray Mouth/Throat PRN Persons, [place], PA      . potassium chloride SA (KLOR-CON) CR tablet 20-40 mEq  20-40 mEq Oral Daily PRN Persons, [place], [place]      . Vitamin D (Ergocalciferol) (DRISDOL) capsule 50,000 Units  50,000 Units Oral Q Fri Persons, [place], [place]   50,000 Units at 05/22/20 1228  . zinc sulfate capsule 220 mg  220 mg Oral Daily Persons, [place], [place]   220 mg at 05/24/20 6301     Discharge Medications: Please see discharge summary for a list of discharge medications.  Relevant Imaging Results:  Relevant Lab Results:   Additional Information SSN [id number] may need wound vac at discharge  Eduard Roux, LCSW

## 2020-05-24 NOTE — Progress Notes (Signed)
Subjective: 2 Days Post-Op Procedure(s) (LRB): REMOVAL OF HARDWARE LEFT LEG (Left) LEFT ABOVE KNEE AMPUTATION (Left) Patient reports pain as severe.   Complaining of right knee pain.  Objective: Vital signs in last 24 hours: Temp:  [97.8 F (36.6 C)-98.6 F (37 C)] 97.8 F (36.6 C) (04/10 0500) Pulse Rate:  [63-76] 73 (04/10 0500) Resp:  [17-24] 17 (04/10 0500) BP: (109-120)/(73-95) 120/89 (04/10 0500) SpO2:  [93 %-100 %] 98 % (04/10 0500)  Intake/Output from previous day: 04/09 0701 - 04/10 0700 In: 633.2 [I.V.:633.2] Out: -  Intake/Output this shift: Total I/O In: -  Out: 2000 [Urine:2000]  Recent Labs    05/22/20 0642 05/22/20 0727 05/23/20 0133 05/24/20 0227  HGB 12.6* 14.3 10.6* 9.6*   Recent Labs    05/23/20 0133 05/24/20 0227  WBC 19.1* 10.7*  RBC 3.79* 3.39*  HCT 33.8* 30.7*  PLT 313 250   Recent Labs    05/23/20 0133 05/24/20 0227  NA 132* 134*  K 4.4 4.0  CL 95* 100  CO2 29 30  BUN 11 8  CREATININE 0.72 0.53*  GLUCOSE 156* 120*  CALCIUM 9.4 9.0   No results for input(s): LABPT, INR in the last 72 hours. Physical exam: Left lower extremity dressings in place with VAC in place canister empty Right lower extremity able to move right leg about with good range of motion of the hip.  He has pain with flexion extension of the right knee.  Slight effusion.  Slight warmth.  No erythema.  Right calf supple nontender dorsiflexion 5 flexion right ankle intact.  Right knee prepped aspiration performed 30 cc of turbid yellow fluid obtained patient tolerates well.   Assessment/Plan: 2 Days Post-Op Procedure(s) (LRB): REMOVAL OF HARDWARE LEFT LEG (Left) LEFT ABOVE KNEE AMPUTATION (Left) Right knee pain  Plan up with physical therapy.  Discharge to SNF later this week.     Ezabella Teska 05/24/2020, 8:14 AM

## 2020-05-25 ENCOUNTER — Encounter (HOSPITAL_COMMUNITY): Payer: Self-pay | Admitting: Orthopedic Surgery

## 2020-05-25 NOTE — Progress Notes (Signed)
Patient ID: [name], male   DOB: 10/21/1955, 65 y.o.   MRN: [id number] Patient is postoperative day 3 left above-the-knee amputation.  There is no drainage in the wound VAC canister.  Patient states he is ready for discharge to skilled nursing.
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
## 2020-05-26 LAB — RESP PANEL BY RT-PCR (FLU A&B, COVID) ARPGX2
Influenza A by PCR: NEGATIVE
Influenza B by PCR: NEGATIVE
SARS Coronavirus 2 by RT PCR: POSITIVE — AB

## 2020-05-26 LAB — SURGICAL PATHOLOGY

## 2020-05-26 MED ORDER — ZINC SULFATE 220 (50 ZN) MG PO CAPS: 220.0000 mg | ORAL_CAPSULE | Freq: Every day | ORAL | Status: DC

## 2020-05-26 MED ORDER — ASCORBIC ACID 1000 MG PO TABS: 1000.0000 mg | ORAL_TABLET | Freq: Every day | ORAL | Status: DC

## 2020-05-26 MED ORDER — OXYCODONE HCL 5 MG PO TABS: 5.0000 mg | ORAL_TABLET | ORAL | 0 refills | Status: DC | PRN

## 2020-05-26 NOTE — TOC Transition Note (Addendum)
Transition of Care [facility]) - CM/SW Discharge Note   Patient Details  Name: [name] MRN: [id number] Date of Birth: 11/16/1955  [facility]) CM/SW Contact:  Epifanio Lesches, RN Phone Number: 05/26/2020, 3:28 PM   Clinical Narrative:    Patient will DC to: [facility] Anticipated DC date: 05/26/2020 Family notified: yes, daughter Abran Duke) Transport by: Sharin Mons   Per MD patient ready for DC today . RN, patient, patient's family, and facility notified of DC. Discharge Summary and FL2 sent to facility. RN to call report prior to discharge ([phone]). Rm # [phone] - A. DC packet on chart. Ambulance transport [phone]) requested for patient , time pick up 5pm.  RNCM will sign off for now as intervention is no longer needed. Please consult [place] again if new needs arise.    Final next level of care: Skilled Nursing Facility Barriers to Discharge: No Barriers Identified   Patient Goals and CMS Choice Patient states their goals for this hospitalization and ongoing recovery are:: "To be able to walk again" CMS Medicare.gov Compare Post Acute Care list provided to:: Other (Comment Required) (Web address for medicare.gov was given to daughter Evonnie Pat) Choice offered to / list presented to : Adult Children (Web address for [employer].gov was given to daughter)  Discharge Placement                       Discharge Plan and Services In-house Referral: Clinical Social Work                                   Social Determinants of Health (SDOH) Interventions     Readmission Risk Interventions Readmission Risk Prevention Plan 10/04/2019  Transportation Screening Complete  PCP or Specialist Appt within 5-7 Days Complete  Home Care Screening Complete  Medication Review (RN CM) Complete  Some recent data might be hidden

## 2020-05-26 NOTE — Discharge Summary (Signed)
Discharge Diagnoses:  Active Problems:   Subacute osteomyelitis of left femur (HCC)   Pyogenic arthritis of left knee joint (HCC)   Acute osteomyelitis of left foot (HCC)   Surgeries: Procedure(s): REMOVAL OF HARDWARE LEFT LEG LEFT ABOVE KNEE AMPUTATION on 05/22/2020    Consultants:   Discharged Condition: Improved  Hospital Course: CONWAY FEDORA is an 65 y.o. male who was admitted 05/22/2020 with a chief complaint of left leg osteomyelitis, with a final diagnosis of Left Leg Chronic Osteomyelitis.  Patient was brought to the operating room on 05/22/2020 and underwent Procedure(s): REMOVAL OF HARDWARE LEFT LEG LEFT ABOVE KNEE AMPUTATION.    Patient was given perioperative antibiotics:  Anti-infectives (From admission, onward)   Start     Dose/Rate Route Frequency Ordered Stop   05/22/20 1600  ceFAZolin (ANCEF) IVPB 1 g/50 mL premix        1 g 100 mL/hr over 30 Minutes Intravenous Every 8 hours 05/22/20 1025 05/23/20 0815   05/22/20 0823  vancomycin (VANCOCIN) powder  Status:  Discontinued          As needed 05/22/20 0823 05/22/20 0859   05/22/20 0822  gentamicin (GARAMYCIN) injection  Status:  Discontinued          As needed 05/22/20 0823 05/22/20 0859   05/22/20 0645  ceFAZolin (ANCEF) IVPB 2g/100 mL premix        2 g 200 mL/hr over 30 Minutes Intravenous On call to O.R. 05/22/20 7829 05/22/20 5621    .  Patient was given sequential compression devices, early ambulation, and aspirin for DVT prophylaxis.  Recent vital signs:  Patient Vitals for the past 24 hrs:  BP Temp Temp src Pulse Resp SpO2  05/26/20 0804 -- -- -- -- -- 99 %  05/26/20 0730 115/73 98.2 F (36.8 C) Oral 66 19 100 %  05/26/20 0234 113/65 98.6 F (37 C) Oral -- 18 95 %  05/25/20 2016 104/67 99 F (37.2 C) Oral 80 17 99 %  05/25/20 1557 108/67 100 F (37.8 C) Oral 81 18 100 %  .  Recent laboratory studies: No results found.  Discharge Medications:   Allergies as of 05/26/2020   No Known Allergies      Medication List    STOP taking these medications   doxycycline 100 MG capsule Commonly known as: VIBRAMYCIN   oxyCODONE-acetaminophen 5-325 MG tablet Commonly known as: Percocet   polyethylene glycol 17 g packet Commonly known as: MiraLax   senna-docusate 8.6-50 MG tablet Commonly known as: Senokot-S     TAKE these medications   albuterol (2.5 MG/3ML) 0.083% nebulizer solution Commonly known as: PROVENTIL Take 2.5 mg by nebulization every 6 (six) hours as needed for wheezing or shortness of breath.   albuterol 108 (90 Base) MCG/ACT inhaler Commonly known as: VENTOLIN HFA Inhale 2 puffs into the lungs every 4 (four) hours as needed for wheezing or shortness of breath.   ascorbic acid 1000 MG tablet Commonly known as: VITAMIN C Take 1 tablet (1,000 mg total) by mouth daily.   Fluticasone-Salmeterol 250-50 MCG/DOSE Aepb Commonly known as: Advair Diskus Inhale 1 puff into the lungs 2 (two) times daily.   lisinopril 20 MG tablet Commonly known as: ZESTRIL Take 1 tablet (20 mg total) by mouth daily with breakfast.   oxyCODONE 5 MG immediate release tablet Commonly known as: Oxy IR/ROXICODONE Take 1-2 tablets (5-10 mg total) by mouth every 4 (four) hours as needed for moderate pain (pain score 4-6).   OXYGEN Inhale  1-3 L into the lungs See admin instructions. Use overnight and when resting in bed   tiZANidine 4 MG tablet Commonly known as: ZANAFLEX Take 4 mg by mouth every morning.   Vitamin D (Ergocalciferol) 1.25 MG (50000 UNIT) Caps capsule Commonly known as: DRISDOL Take 50,000 Units by mouth every other day.   zinc sulfate 220 (50 Zn) MG capsule Take 1 capsule (220 mg total) by mouth daily.       Diagnostic Studies: DG Tibia/Fibula Left  Result Date: 05/02/2020 CLINICAL DATA:  Known in the left lower leg. EXAM: LEFT TIBIA AND FIBULA - 2 VIEW COMPARISON:  March 24, 2020 FINDINGS: Postoperative changes are identified. There is no acute fracture or  dislocation. Soft tissues are unremarkable. IMPRESSION: Postoperative changes. No acute radiographic abnormality identified. Electronically Signed   By: Sherian Rein M.D.   On: 05/02/2020 15:46    Patient benefited maximally from their hospital stay and there were no complications.     Disposition: Discharge disposition: 03-Skilled Nursing Facility      Discharge Instructions    Call MD / Call 911   Complete by: As directed    If you experience chest pain or shortness of breath, CALL 911 and be transported to the hospital emergency room.  If you develope a fever above 101 F, pus (white drainage) or increased drainage or redness at the wound, or calf pain, call your surgeon's office.   Constipation Prevention   Complete by: As directed    Drink plenty of fluids.  Prune juice may be helpful.  You may use a stool softener, such as Colace (over the counter) 100 mg twice a day.  Use MiraLax (over the counter) for constipation as needed.   Diet - low sodium heart healthy   Complete by: As directed    Discharge instructions   Complete by: As directed    Call office if prevena vac alarms   Increase activity slowly as tolerated   Complete by: As directed    Negative Pressure Wound Therapy - Incisional   Complete by: As directed    Show patient how to attach prevena vac. Should call office if alarms      Follow-up Information    Maryelizabeth Eberle, [place], [place] In 1 week.   Specialty: Orthopedic Surgery Contact information: [address] [place] [address] [phone]                Signed: [place] Karole Oo 05/26/2020, 1:23 PM

## 2020-05-26 NOTE — Progress Notes (Signed)
Covid test result came back positive; Primary MD, PT[facility],& [facility] were notified & discharge order cancelled.
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
## 2020-05-26 NOTE — TOC Progression Note (Signed)
Transition of Care [facility]) - Progression Note    Patient Details  Name: AADAN CHENIER MRN: [id number] Date of Birth: May 31, 1955  [facility]) CM/SW Contact  Epifanio Lesches, RN Phone Number: 05/26/2020, 12:50 PM  Clinical Narrative:    [facility] SNF Rehab. admissions reviewing for acceptance. Updated COVID requested, MD and nurse made aware. Authorization for SNF placement received : [facility] health reference ID [id number],  Plan auth. # [id number], approved for 3 days 4/01-17-13, Evalyn Casco CM, fax clinicals to [phone].  TOC team monitoring and following for  TOC needs.  Expected Discharge Plan: Skilled Nursing Facility Barriers to Discharge: No SNF bed  Expected Discharge Plan and Services Expected Discharge Plan: Skilled Nursing Facility In-house Referral: Clinical Social Work     Living arrangements for the past 2 months: Single Family Home                                       Social Determinants of Health (SDOH) Interventions    Readmission Risk Interventions Readmission Risk Prevention Plan 10/04/2019  Transportation Screening Complete  PCP or Specialist Appt within 5-7 Days Complete  Home Care Screening Complete  Medication Review (RN CM) Complete  Some recent data might be hidden

## 2020-05-26 NOTE — Progress Notes (Signed)
Pt report given to Lynden Ang, receiving RN for [facility];still awaiting for Covid test result

## 2020-05-26 NOTE — Progress Notes (Signed)
Patient is status post left above-knee amputation postop day 4.  Pending rehab.  He said he had difficulty moving his right leg yesterday but is able to move it today.  Can lift it off the bed dorsiflexion plantarflexion.  0 cc in his wound VAC canister no green checks  Plan to discharge to SNF when bed available we will place prescription on chart

## 2020-05-27 NOTE — Progress Notes (Signed)
Report called to [place] at [facility]. All questions answered. Pt belongings gathered to be sent with him. Pt waiting on PT[facility] for transport.
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
## 2020-05-27 NOTE — Progress Notes (Signed)
Is postop day 5 status post above-knee amputation.  He was due to go to a nursing facility last evening.  Unfortunately his Covid test came out positive.  Wound VAC is unplugged and is lost suction.  He has not had any drainage in the canister.  I have elected to remove the dressing.  He is well opposed wound edges and controlled swelling.  No wound dehiscence no ascending cellulitis.  A clean dry dressing was applied Currently patient is asymptomatic for Covid.  We will continue to monitor

## 2020-05-27 NOTE — TOC Transition Note (Signed)
Transition of Care [facility]) - CM/SW Discharge Note   Patient Details  Name: [name] MRN: [id number] Date of Birth: Apr 15, 1955  Transition of Care [facility]) CM/SW Contact:  Epifanio Lesches, RN Phone Number: 05/27/2020, 11:30 AM   Clinical Narrative:     Patient will DC to: [facility] SNF Anticipated DC date: 05/27/2020 Family notified: yes , daughter, Abran Duke Transport bySharin Mons  NCM received call for [facility] SNF admission. [place] made NCM aware they wouldn't be able to receive until 4/25( 15 days out) 2/2 positive COVID noted on 4/12.  NCM noted [facility]'s acceptance in the hub. Called [facility] admission and requested bed offer and [facility] stated they can extend bed offer and pt can come today. NCM made pt aware and pt accepted.  NCM called [employer]/ Misty Stanley and made them aware of SNF change....  Plan auth. # [id number], approved for 3 days 4/13-4-15, Evalyn Casco CM, fax clinicals to [phone].  Per MD patient ready for DC today . RN, patient, patient's family, and facility notified of DC. Discharge Summary and FL2 sent to facility. RN to call report prior to discharge [phone]). DC packet on chart. Ambulance transport requested for patient.   RNCM will sign off for now as intervention is no longer needed. Please consult [place] again if new needs arise.   Final next level of care: Skilled Nursing Facility [facility] SNF) Barriers to Discharge: No Barriers Identified   Patient Goals and CMS Choice Patient states their goals for this hospitalization and ongoing recovery are:: "To be able to walk again" CMS Medicare.gov Compare Post Acute Care list provided to:: Other (Comment Required) (Web address for medicare.gov was given to daughter Evonnie Pat) Choice offered to / list presented to : Adult Children (Web address for [employer].gov was given to daughter)  Discharge Placement                       Discharge Plan and Services In-house Referral: Clinical Social  Work                                   Social Determinants of Health (SDOH) Interventions     Readmission Risk Interventions Readmission Risk Prevention Plan 10/04/2019  Transportation Screening Complete  PCP or Specialist Appt within 5-7 Days Complete  Home Care Screening Complete  Medication Review (RN CM) Complete  Some recent data might be hidden

## 2020-05-28 ENCOUNTER — Non-Acute Institutional Stay (SKILLED_NURSING_FACILITY): Payer: Medicare Other | Admitting: Internal Medicine

## 2020-05-28 ENCOUNTER — Encounter: Payer: Self-pay | Admitting: Internal Medicine

## 2020-05-28 DIAGNOSIS — F17210 Nicotine dependence, cigarettes, uncomplicated: Secondary | ICD-10-CM | POA: Diagnosis not present

## 2020-05-28 DIAGNOSIS — D62 Acute posthemorrhagic anemia: Secondary | ICD-10-CM | POA: Insufficient documentation

## 2020-05-28 DIAGNOSIS — U071 COVID-19: Secondary | ICD-10-CM | POA: Insufficient documentation

## 2020-05-28 DIAGNOSIS — M869 Osteomyelitis, unspecified: Secondary | ICD-10-CM

## 2020-05-28 NOTE — Assessment & Plan Note (Signed)
Nico Derm 21 mg patch daily while @ SNF
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
## 2020-05-28 NOTE — Assessment & Plan Note (Signed)
Monitor for blood loss @ SNF FeSO4 supplement

## 2020-05-28 NOTE — Patient Instructions (Signed)
See assessment and plan under each diagnosis in the problem list and acutely for this visit 

## 2020-05-28 NOTE — Assessment & Plan Note (Addendum)
He denies all COVID symptoms; he is in quarantine @ SNF

## 2020-05-29 NOTE — Assessment & Plan Note (Signed)
PT/OT @ SNF 

## 2020-06-02 ENCOUNTER — Non-Acute Institutional Stay (SKILLED_NURSING_FACILITY): Payer: Medicare Other | Admitting: Adult Health

## 2020-06-02 ENCOUNTER — Encounter: Payer: Self-pay | Admitting: Adult Health

## 2020-06-02 DIAGNOSIS — I10 Essential (primary) hypertension: Secondary | ICD-10-CM

## 2020-06-02 DIAGNOSIS — J449 Chronic obstructive pulmonary disease, unspecified: Secondary | ICD-10-CM

## 2020-06-02 DIAGNOSIS — M869 Osteomyelitis, unspecified: Secondary | ICD-10-CM

## 2020-06-02 DIAGNOSIS — R12 Heartburn: Secondary | ICD-10-CM

## 2020-06-02 DIAGNOSIS — U071 COVID-19: Secondary | ICD-10-CM

## 2020-06-02 DIAGNOSIS — J4489 Other specified chronic obstructive pulmonary disease: Secondary | ICD-10-CM
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
## 2020-06-03 ENCOUNTER — Telehealth: Payer: Self-pay

## 2020-06-03 NOTE — Telephone Encounter (Signed)
Apanza from [employer] called to cancel patients appointment she stated the patient tested positive for covid and is on his 8th day she would like a call back regarding when patient should be seen since he is a post op call [phone]

## 2020-06-03 NOTE — Telephone Encounter (Signed)
Verified with management that ok to see in office this Friday will be day 10 pt is s/p a AKA on 05/22/20

## 2020-06-04 ENCOUNTER — Encounter: Payer: Medicare Other | Admitting: Orthopedic Surgery

## 2020-06-04 ENCOUNTER — Other Ambulatory Visit: Payer: Self-pay | Admitting: Adult Health

## 2020-06-04 MED ORDER — OXYCODONE HCL 5 MG PO TABS: 5.0000 mg | ORAL_TABLET | ORAL | 0 refills | Status: DC | PRN

## 2020-06-05 ENCOUNTER — Ambulatory Visit (INDEPENDENT_AMBULATORY_CARE_PROVIDER_SITE_OTHER): Payer: Medicare Other | Admitting: Physician Assistant

## 2020-06-05 ENCOUNTER — Encounter: Payer: Self-pay | Admitting: Physician Assistant

## 2020-06-05 DIAGNOSIS — M869 Osteomyelitis, unspecified: Secondary | ICD-10-CM
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
## 2020-06-09 ENCOUNTER — Non-Acute Institutional Stay (SKILLED_NURSING_FACILITY): Payer: Medicare Other | Admitting: Adult Health

## 2020-06-09 ENCOUNTER — Encounter: Payer: Self-pay | Admitting: Adult Health

## 2020-06-09 DIAGNOSIS — F17210 Nicotine dependence, cigarettes, uncomplicated: Secondary | ICD-10-CM | POA: Diagnosis not present

## 2020-06-09 DIAGNOSIS — M869 Osteomyelitis, unspecified: Secondary | ICD-10-CM | POA: Diagnosis not present

## 2020-06-09 DIAGNOSIS — U071 COVID-19: Secondary | ICD-10-CM

## 2020-06-09 DIAGNOSIS — J449 Chronic obstructive pulmonary disease, unspecified: Secondary | ICD-10-CM

## 2020-06-09 DIAGNOSIS — I10 Essential (primary) hypertension: Secondary | ICD-10-CM

## 2020-06-09 MED ORDER — LISINOPRIL 20 MG PO TABS: 20.0000 mg | ORAL_TABLET | Freq: Every day | ORAL | 0 refills | Status: DC

## 2020-06-09 MED ORDER — TIZANIDINE HCL 4 MG PO TABS: 4.0000 mg | ORAL_TABLET | Freq: Every morning | ORAL | 0 refills | Status: DC

## 2020-06-09 MED ORDER — ZINC SULFATE 220 (50 ZN) MG PO CAPS: 220.0000 mg | ORAL_CAPSULE | Freq: Every day | ORAL | 0 refills | Status: DC

## 2020-06-09 MED ORDER — ALBUTEROL SULFATE HFA 108 (90 BASE) MCG/ACT IN AERS: 2.0000 | INHALATION_SPRAY | RESPIRATORY_TRACT | 0 refills | Status: DC | PRN

## 2020-06-09 MED ORDER — VITAMIN D (ERGOCALCIFEROL) 1.25 MG (50000 UNIT) PO CAPS: 50000.0000 [IU] | ORAL_CAPSULE | ORAL | 0 refills | Status: DC

## 2020-06-09 MED ORDER — FLUTICASONE-SALMETEROL 250-50 MCG/DOSE IN AEPB: 1.0000 | INHALATION_SPRAY | Freq: Two times a day (BID) | RESPIRATORY_TRACT | 0 refills | Status: DC

## 2020-06-09 MED ORDER — OXYCODONE HCL 5 MG PO TABS: 5.0000 mg | ORAL_TABLET | ORAL | 0 refills | Status: DC | PRN

## 2020-06-09 MED ORDER — NICOTINE 21 MG/24HR TD PT24: 21.0000 mg | MEDICATED_PATCH | Freq: Every day | TRANSDERMAL | 0 refills | Status: DC
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
## 2020-06-10 ENCOUNTER — Other Ambulatory Visit: Payer: Self-pay

## 2020-06-10 ENCOUNTER — Encounter: Payer: Self-pay | Admitting: Podiatry

## 2020-06-10 ENCOUNTER — Ambulatory Visit (INDEPENDENT_AMBULATORY_CARE_PROVIDER_SITE_OTHER): Payer: Medicare Other | Admitting: Podiatry

## 2020-06-10 DIAGNOSIS — M79674 Pain in right toe(s): Secondary | ICD-10-CM | POA: Diagnosis not present

## 2020-06-10 DIAGNOSIS — B351 Tinea unguium: Secondary | ICD-10-CM | POA: Diagnosis not present

## 2020-06-10 DIAGNOSIS — Q828 Other specified congenital malformations of skin: Secondary | ICD-10-CM

## 2020-06-10 NOTE — Progress Notes (Signed)
This patient returns to my office for at risk foot care.  This patient requires this care by a professional since this patient will be at risk due to having  BKA left leg. This patient is unable to cut nails on his right foot  himself since the patient cannot reach his nails.These nails are painful walking and wearing shoes.  Patient also has painful callus on bottom of right foot. This patient presents for at risk foot care today.  General Appearance  Alert, conversant and in no acute stress.  Vascular  Dorsalis pedis and posterior tibial  pulses are absent .  Capillary return is within normal limits  bilaterally. Cold feet  right  Neurologic  Senn-Weinstein monofilament wire test diminished   right. Muscle power within normal limits bilaterally.  Nails Thick disfigured discolored nails with subungual debris  from hallux to fifth toes right . No evidence of bacterial infection or drainage bilaterally.  Orthopedic  No limitations of motion  feet .  No crepitus or effusions noted.  No bony pathology or digital deformities noted. BKA left leg.  Skin  normotropic skin with no porokeratosis noted right.  No signs of infections or ulcers noted.  Callus sub 2,5  Right foot.   Onychomycosis  Pain in right toes  Callus right foot.  Consent was obtained for treatment procedures.   Mechanical debridement of nails 1-5  right performed with a nail nipper.  Filed with dremel without incident. Debride callus sub 2,5  Right foot with # 15 blade.   Return office visit  3 months                   Told patient to return for periodic foot care and evaluation due to potential at risk complications.   Helane Gunther DPM

## 2020-06-10 NOTE — Addendum Note (Signed)
Addended by: Helane Gunther on: 06/10/2020 05:45 PM   Modules accepted: Level of Service

## 2020-06-11 ENCOUNTER — Ambulatory Visit (INDEPENDENT_AMBULATORY_CARE_PROVIDER_SITE_OTHER): Payer: Medicare Other | Admitting: Physician Assistant

## 2020-06-11 ENCOUNTER — Encounter: Payer: Self-pay | Admitting: Orthopedic Surgery

## 2020-06-11 DIAGNOSIS — M869 Osteomyelitis, unspecified: Secondary | ICD-10-CM
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
## 2020-06-25 ENCOUNTER — Ambulatory Visit: Payer: Medicare Other | Admitting: Orthopedic Surgery

## 2020-06-29 ENCOUNTER — Telehealth: Payer: Self-pay | Admitting: Physician Assistant

## 2020-06-29 NOTE — Telephone Encounter (Signed)
Manus Gunning (Physical Therapist) called requesting a call back on behalf of patient. Manus Gunning states that pain is in sever pains and wondering of refills of pain medications. She would like to discuss pain management for patient. Patient also complains of unable to sleep at night due to pain. Please call Kindra at [phone].

## 2020-06-30 ENCOUNTER — Telehealth: Payer: Self-pay

## 2020-06-30 NOTE — Telephone Encounter (Signed)
[name] called again concerning patient.  Would like a call back to discuss?  Thank you.

## 2020-07-01 ENCOUNTER — Telehealth: Payer: Self-pay

## 2020-07-01 ENCOUNTER — Other Ambulatory Visit: Payer: Self-pay | Admitting: Physician Assistant

## 2020-07-01 NOTE — Telephone Encounter (Signed)
Spoke with PT Kindra. She saif patient was having increased pain and that he was wearing a shrinker that was too tight for 2 days. She got an order for an in home utra sound from patients pcp. She is also worried about his incision. Patient was supposed to follow up with me in 2 weeks after his last appointment but never made an appointment. Can we reach out to patient and get him in here as soon as possible. Thanks

## 2020-07-01 NOTE — Telephone Encounter (Signed)
I called and advised pt that we would need to see him in the office to evaluate the pain that he is having. He advised that he is not able to come in this week and asked if I would call him back this afternoon as he will try and work out transportation. Will hold this message and try again. Possibly we can set up for [employer] transportation? Will hold and try pt back.

## 2020-07-01 NOTE — Telephone Encounter (Signed)
Talked with patient concerning appointment for this week for his right leg pain, but patient stated that he is not able to get transportation until Monday, 07/06/2020. Appointment scheduled for Monday, 07/06/2020.

## 2020-07-01 NOTE — Telephone Encounter (Signed)
Can you please call and make an appt for this pt to come in tomorrow or Friday? Let me know. Thanks!

## 2020-07-01 NOTE — Telephone Encounter (Signed)
Pt sch for Monday advised to call if any changes and we can see him ay time this week.

## 2020-07-01 NOTE — Telephone Encounter (Signed)
Noted  

## 2020-07-01 NOTE — Telephone Encounter (Signed)
I called to make appt pt states he will check with transportation but in the mean time he states his leg is in a lot of pain and he would like to have something called in.

## 2020-07-03 ENCOUNTER — Emergency Department (HOSPITAL_COMMUNITY): Payer: Medicare Other

## 2020-07-03 ENCOUNTER — Other Ambulatory Visit: Payer: Self-pay

## 2020-07-03 ENCOUNTER — Encounter (HOSPITAL_COMMUNITY): Payer: Self-pay

## 2020-07-03 ENCOUNTER — Emergency Department (HOSPITAL_COMMUNITY)
Admission: EM | Admit: 2020-07-03 | Discharge: 2020-07-04 | Disposition: A | Discharge status: Home / Self Care | Payer: Medicare Other | Attending: Emergency Medicine | Admitting: Emergency Medicine

## 2020-07-03 DIAGNOSIS — Z8616 Personal history of COVID-19: Secondary | ICD-10-CM | POA: Diagnosis not present

## 2020-07-03 DIAGNOSIS — J441 Chronic obstructive pulmonary disease with (acute) exacerbation: Secondary | ICD-10-CM | POA: Insufficient documentation

## 2020-07-03 DIAGNOSIS — I10 Essential (primary) hypertension: Secondary | ICD-10-CM | POA: Diagnosis not present

## 2020-07-03 DIAGNOSIS — Z7951 Long term (current) use of inhaled steroids: Secondary | ICD-10-CM | POA: Insufficient documentation

## 2020-07-03 DIAGNOSIS — J45909 Unspecified asthma, uncomplicated: Secondary | ICD-10-CM | POA: Diagnosis not present

## 2020-07-03 DIAGNOSIS — F17210 Nicotine dependence, cigarettes, uncomplicated: Secondary | ICD-10-CM | POA: Diagnosis not present

## 2020-07-03 DIAGNOSIS — Z96652 Presence of left artificial knee joint: Secondary | ICD-10-CM | POA: Diagnosis not present

## 2020-07-03 DIAGNOSIS — L03116 Cellulitis of left lower limb: Secondary | ICD-10-CM | POA: Insufficient documentation

## 2020-07-03 DIAGNOSIS — Z79899 Other long term (current) drug therapy: Secondary | ICD-10-CM | POA: Diagnosis not present

## 2020-07-03 DIAGNOSIS — M79605 Pain in left leg: Secondary | ICD-10-CM | POA: Diagnosis present

## 2020-07-03 LAB — BASIC METABOLIC PANEL
Anion gap: 10 (ref 5–15)
BUN: 12 mg/dL (ref 8–23)
CO2: 27 mmol/L (ref 22–32)
Calcium: 9.4 mg/dL (ref 8.9–10.3)
Chloride: 97 mmol/L — ABNORMAL LOW (ref 98–111)
Creatinine, Ser: 0.58 mg/dL — ABNORMAL LOW (ref 0.61–1.24)
GFR, Estimated: >60 mL/min (ref >60)
Glucose, Bld: 68 mg/dL — ABNORMAL LOW (ref 70–99)
Potassium: 4 mmol/L (ref 3.5–5.1)
Sodium: 134 mmol/L — ABNORMAL LOW (ref 135–145)

## 2020-07-03 LAB — CBC WITH DIFFERENTIAL/PLATELET
Abs Immature Granulocytes: 0.05 10*3/uL (ref 0.00–0.07)
Basophils Absolute: 0.1 10*3/uL (ref 0.0–0.1)
Basophils Relative: 1 %
Eosinophils Absolute: 0.3 10*3/uL (ref 0.0–0.5)
Eosinophils Relative: 3 %
HCT: 35.1 % — ABNORMAL LOW (ref 39.0–52.0)
Hemoglobin: 10.4 g/dL — ABNORMAL LOW (ref 13.0–17.0)
Immature Granulocytes: 0 %
Lymphocytes Relative: 9 %
Lymphs Abs: 1 10*3/uL (ref 0.7–4.0)
MCH: 26.9 pg (ref 26.0–34.0)
MCHC: 29.6 g/dL — ABNORMAL LOW (ref 30.0–36.0)
MCV: 90.9 fL (ref 80.0–100.0)
Monocytes Absolute: 0.8 10*3/uL (ref 0.1–1.0)
Monocytes Relative: 7 %
Neutro Abs: 9.1 10*3/uL — ABNORMAL HIGH (ref 1.7–7.7)
Neutrophils Relative %: 80 %
Platelets: 306 10*3/uL (ref 150–400)
RBC: 3.86 MIL/uL — ABNORMAL LOW (ref 4.22–5.81)
RDW: 16.7 % — ABNORMAL HIGH (ref 11.5–15.5)
WBC: 11.2 10*3/uL — ABNORMAL HIGH (ref 4.0–10.5)
nRBC: 0 % (ref 0.0–0.2)

## 2020-07-03 MED ORDER — HYDROCODONE-ACETAMINOPHEN 5-325 MG PO TABS
1.0000 | ORAL_TABLET | Freq: Once | ORAL | Status: AC
Start: 2020-07-03 — End: 2020-07-03
  Administered 2020-07-03: 1 via ORAL
  Filled 2020-07-03: qty 1

## 2020-07-03 MED ORDER — HYDROCODONE-ACETAMINOPHEN 5-325 MG PO TABS: 1.0000 | ORAL_TABLET | Freq: Two times a day (BID) | ORAL | 0 refills | Status: DC | PRN

## 2020-07-03 MED ORDER — SODIUM CHLORIDE 0.9 % IV SOLN
1.0000 g | Freq: Once | INTRAVENOUS | Status: DC
  Filled 2020-07-03: qty 10

## 2020-07-03 MED ORDER — DOXYCYCLINE HYCLATE 100 MG PO TABS: 100.0000 mg | ORAL_TABLET | Freq: Two times a day (BID) | ORAL | 0 refills | Status: AC

## 2020-07-03 MED ORDER — LIDOCAINE HCL (PF) 1 % IJ SOLN
INTRAMUSCULAR | Status: AC
  Filled 2020-07-03: qty 5

## 2020-07-03 MED ORDER — SODIUM CHLORIDE 0.9 % IV SOLN: 1.0000 g | Freq: Once | INTRAVENOUS | Status: DC

## 2020-07-03 MED ORDER — CEPHALEXIN 500 MG PO CAPS: 500.0000 mg | ORAL_CAPSULE | Freq: Three times a day (TID) | ORAL | 0 refills | Status: AC

## 2020-07-03 MED ORDER — CLINDAMYCIN PHOSPHATE 600 MG/50ML IV SOLN: 600.0000 mg | Freq: Once | INTRAVENOUS | Status: DC

## 2020-07-03 MED ORDER — DOXYCYCLINE HYCLATE 100 MG PO TABS
100.0000 mg | ORAL_TABLET | Freq: Once | ORAL | Status: AC
  Administered 2020-07-03: 100 mg via ORAL
  Filled 2020-07-03: qty 1

## 2020-07-03 MED ORDER — CEFTRIAXONE SODIUM 1 G IJ SOLR: 1.0000 g | Freq: Once | INTRAMUSCULAR | Status: DC

## 2020-07-03 MED ORDER — CEFTRIAXONE SODIUM 1 G IJ SOLR
1.0000 g | Freq: Once | INTRAMUSCULAR | Status: AC
  Administered 2020-07-03: 1 g via INTRAMUSCULAR
  Filled 2020-07-03: qty 10

## 2020-07-03 MED ORDER — ACETAMINOPHEN 325 MG PO TABS
650.0000 mg | ORAL_TABLET | Freq: Once | ORAL | Status: AC
  Administered 2020-07-03: 650 mg via ORAL
  Filled 2020-07-03: qty 2

## 2020-07-03 NOTE — ED Notes (Signed)
Secretary is calling [facility] for this patient.

## 2020-07-03 NOTE — Discharge Instructions (Signed)
It was wonderful to see you.  We are treating you for cellulitis of the skin of your stump area.  You received some antibiotics while you were here today and the need to continue taking the 2 antibiotics that we sent to your pharmacy.  It is very important that you follow-up with Dr. Lajoyce Corners on Monday.  Recommend continue using Tylenol/ibuprofen as you have been.  You can use Norco for severe pain, take with food.  Do not use this medication if you are going to drive or operate heavy machinery.  Please follow-up if you are feeling persistently feverish/fatigued, difficulty breathing, or extensive worsening of your leg.
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
## 2020-07-03 NOTE — ED Notes (Signed)
Patient watching TV, A/O, continues to wait on [facility] for transport home.

## 2020-07-03 NOTE — ED Triage Notes (Signed)
Pt BIB [facility] reporting stump pain for 4 days at amputation site. Amputation in April. Redness and swelling noted at site of incision. Pt reports 10/10 pain, unable to sleep due to pain.

## 2020-07-03 NOTE — ED Notes (Signed)
Called for [facility] 11th on list

## 2020-07-04 NOTE — ED Notes (Signed)
Discharge instructions reviewed and explained, pt verbalized understanding. Report given to [facility] for transport home.

## 2020-07-06 ENCOUNTER — Ambulatory Visit (INDEPENDENT_AMBULATORY_CARE_PROVIDER_SITE_OTHER): Payer: Medicare Other | Admitting: Physician Assistant

## 2020-07-06 ENCOUNTER — Encounter: Payer: Self-pay | Admitting: Physician Assistant

## 2020-07-06 DIAGNOSIS — T847XXS Infection and inflammatory reaction due to other internal orthopedic prosthetic devices, implants and grafts, sequela: Secondary | ICD-10-CM

## 2020-07-06 DIAGNOSIS — Z89612 Acquired absence of left leg above knee: Secondary | ICD-10-CM

## 2020-07-06 DIAGNOSIS — M25562 Pain in left knee: Secondary | ICD-10-CM
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
## 2020-07-08 ENCOUNTER — Telehealth: Payer: Self-pay | Admitting: Radiology

## 2020-07-08 NOTE — Telephone Encounter (Signed)
Joni Reining from [facility] called needing verbal orders for Mr Qazi for a skilled Nursing Home eval. Can be reached at [phone] today.  If tomorrow, [place] @ [phone]

## 2020-07-08 NOTE — Telephone Encounter (Signed)
I called and gave verbal ok for request below. To call with questions.  Lm on vm

## 2020-07-09 ENCOUNTER — Telehealth: Payer: Self-pay

## 2020-07-09 NOTE — Telephone Encounter (Signed)
I called and sw the pt's aid and gave the number for [facility] and advised they can call as they have to arrange transportation also. Will call with any questions.

## 2020-07-09 NOTE — Telephone Encounter (Signed)
-----   Message from Raelyn Number, CMA sent at 07/09/2020  9:57 AM EDT ----- Regarding: RE: urgent request Yes, please. I don't know how you guys do the scheduling. If [facility] will see the order and schedule him or how that works. But he doesn't have an appt for the imaging yet.  -Abby ----- Message ----- From: Rodena Medin, RMA Sent: 07/08/2020   3:47 PM EDT To: Jordan Hawks Wingate, CMA Subject: FW: urgent request                             Do I need to call and schedule?  ----- Message ----- From: Raelyn Number, CMA Sent: 07/07/2020   2:09 PM EDT To: Rodena Medin, RMA, Samuella Cota, CMA Subject: RE: urgent request                             Hey Eyvonne Burchfield,  This patient can be scheduled. He doesn't need a prior authorization :)   -Abby ----- Message ----- From: Samuella Cota, CMA Sent: 07/06/2020  10:49 AM EDT To: Jordan Hawks Wingate, CMA Subject: FW: urgent request                             Can you help with this please. thanks ----- Message ----- From: Rodena Medin, RMA Sent: 07/06/2020  10:24 AM EDT To: Samuella Cota, CMA Subject: urgent request                                 Just entered an MRI request for this pt of the femur he has possible osteo and wanted to see if we could get this done ASAP. Thank you!!

## 2020-07-14 ENCOUNTER — Encounter: Payer: Medicare Other | Admitting: Orthopedic Surgery

## 2020-07-14 ENCOUNTER — Encounter: Payer: Medicare Other | Admitting: Family

## 2020-07-16 ENCOUNTER — Telehealth: Payer: Self-pay | Admitting: *Deleted

## 2020-07-16 NOTE — Telephone Encounter (Signed)
-----   Message from Raelyn Number, CMA sent at 07/07/2020  2:08 PM EDT ----- Regarding: RE: urgent request Hey Autumn,  This patient can be scheduled. He doesn't need a prior authorization :)   -Abby ----- Message ----- From: Samuella Cota, CMA Sent: 07/06/2020  10:49 AM EDT To: Jordan Hawks Wingate, CMA Subject: FW: urgent request                             Can you help with this please. thanks ----- Message ----- From: Rodena Medin, RMA Sent: 07/06/2020  10:24 AM EDT To: Samuella Cota, CMA Subject: urgent request                                 Just entered an MRI request for this pt of the femur he has possible osteo and wanted to see if we could get this done ASAP. Thank you!!

## 2020-07-16 NOTE — Telephone Encounter (Signed)
I called pt and advised him that [facility] has tried calling on 07/08/20 and left message to return call to scehdule appt, number to [facility] was given to SO and states will call to schedule appt

## 2020-07-17 ENCOUNTER — Telehealth: Payer: Self-pay

## 2020-07-17 NOTE — Telephone Encounter (Signed)
I called and advised PT to put services on hold for right now pending future imaging and treatment plan.

## 2020-07-17 NOTE — Telephone Encounter (Signed)
[name] from [facility] calling wanting to talk to you about patient Wanting to know if he is to continue PT? He cb# is [phone]

## 2020-07-28 ENCOUNTER — Ambulatory Visit
Admission: RE | Admit: 2020-07-28 | Discharge: 2020-07-28 | Disposition: A | Discharge status: Home / Self Care | Payer: Medicare Other | Source: Ambulatory Visit | Attending: Physician Assistant | Admitting: Physician Assistant

## 2020-07-28 ENCOUNTER — Other Ambulatory Visit: Payer: Self-pay

## 2020-07-28 DIAGNOSIS — Z89612 Acquired absence of left leg above knee: Secondary | ICD-10-CM

## 2020-07-28 DIAGNOSIS — T847XXS Infection and inflammatory reaction due to other internal orthopedic prosthetic devices, implants and grafts, sequela: Secondary | ICD-10-CM

## 2020-07-30 ENCOUNTER — Other Ambulatory Visit: Payer: Self-pay | Admitting: Orthopedic Surgery

## 2020-07-30 ENCOUNTER — Ambulatory Visit: Payer: Medicare Other | Admitting: Orthopedic Surgery

## 2020-07-30 ENCOUNTER — Telehealth: Payer: Self-pay | Admitting: Orthopedic Surgery

## 2020-07-30 MED ORDER — HYDROCODONE-ACETAMINOPHEN 5-325 MG PO TABS: 1.0000 | ORAL_TABLET | Freq: Two times a day (BID) | ORAL | 0 refills | Status: DC | PRN

## 2020-07-30 NOTE — Telephone Encounter (Signed)
Pt called stating he was supposed to have an appt today but then his ride never picked him up. Pt would like a CB from Autumn so he can ask her a couple questions.   [phone]

## 2020-08-05 ENCOUNTER — Telehealth: Payer: Self-pay | Admitting: Orthopedic Surgery

## 2020-08-05 NOTE — Telephone Encounter (Signed)
Pt called wanting a call back from Dr. Audrie Lia nurse. Pt states he missed previous appt due to transportation issues. Call then dropped before message he wanted to leave the nurse was said. Number pt called from was [phone].

## 2020-08-05 NOTE — Telephone Encounter (Signed)
I called and sw pt made an appt for Tuesday at 1:15 next week for follow up

## 2020-08-11 ENCOUNTER — Ambulatory Visit: Payer: Medicare Other | Admitting: Orthopedic Surgery

## 2020-08-18 ENCOUNTER — Encounter: Payer: Self-pay | Admitting: Family

## 2020-08-18 ENCOUNTER — Ambulatory Visit (INDEPENDENT_AMBULATORY_CARE_PROVIDER_SITE_OTHER): Payer: Medicare Other | Admitting: Family

## 2020-08-18 ENCOUNTER — Other Ambulatory Visit: Payer: Self-pay

## 2020-08-18 DIAGNOSIS — Z89612 Acquired absence of left leg above knee: Secondary | ICD-10-CM

## 2020-08-18 DIAGNOSIS — S78112A Complete traumatic amputation at level between left hip and knee, initial encounter: Secondary | ICD-10-CM | POA: Insufficient documentation

## 2020-08-18 MED ORDER — HYDROCODONE-ACETAMINOPHEN 5-325 MG PO TABS: 1.0000 | ORAL_TABLET | Freq: Two times a day (BID) | ORAL | 0 refills | Status: DC | PRN
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
## 2020-09-07 ENCOUNTER — Telehealth: Payer: Self-pay | Admitting: Orthopedic Surgery

## 2020-09-07 DIAGNOSIS — S78119A Complete traumatic amputation at level between unspecified hip and knee, initial encounter: Secondary | ICD-10-CM

## 2020-09-07 NOTE — Telephone Encounter (Signed)
PT wants to know if he could get a  refill on hydrocodone.   CB [phone]

## 2020-09-07 NOTE — Telephone Encounter (Signed)
05/22/20 AKA please see below and advise.

## 2020-09-08 ENCOUNTER — Telehealth: Payer: Self-pay

## 2020-09-08 MED ORDER — HYDROCODONE-ACETAMINOPHEN 5-325 MG PO TABS: 1.0000 | ORAL_TABLET | Freq: Two times a day (BID) | ORAL | 0 refills | Status: DC | PRN

## 2020-09-08 NOTE — Addendum Note (Signed)
Addended by: Adonis Huguenin on: 09/08/2020 08:26 AM   Modules accepted: Orders

## 2020-09-08 NOTE — Telephone Encounter (Signed)
Pt called and would like some pain pills sent in he hit his leg lastnight and it is swollen and hurting.

## 2020-09-09 NOTE — Telephone Encounter (Signed)
Rx called in yesterday

## 2020-09-11 ENCOUNTER — Ambulatory Visit: Payer: Medicare Other | Admitting: Podiatry

## 2020-09-14 ENCOUNTER — Ambulatory Visit: Payer: Medicare Other | Admitting: Orthopedic Surgery

## 2020-09-18 ENCOUNTER — Telehealth: Payer: Self-pay

## 2020-09-18 NOTE — Telephone Encounter (Signed)
Pt called triage and said no one is coming to see him for [facility] visits and his leg is killing him. He would like a cb to discuss @ [phone]

## 2020-09-18 NOTE — Telephone Encounter (Signed)
Can you please call pt and make an appt for Monday. He needs follow up in the office. Thanks!

## 2020-09-28 ENCOUNTER — Telehealth: Payer: Self-pay | Admitting: Orthopedic Surgery

## 2020-09-28 NOTE — Telephone Encounter (Signed)
Pt called and states he talked to [facility] and they told him to have you send a script over to them.   CB [phone]

## 2020-09-29 NOTE — Telephone Encounter (Signed)
Do you want pt to come in for follow up with you before witting rx?

## 2020-10-02 ENCOUNTER — Telehealth: Payer: Self-pay | Admitting: Orthopedic Surgery

## 2020-10-02 ENCOUNTER — Other Ambulatory Visit: Payer: Self-pay | Admitting: Physician Assistant

## 2020-10-02 MED ORDER — HYDROCODONE-ACETAMINOPHEN 5-325 MG PO TABS: 1.0000 | ORAL_TABLET | Freq: Two times a day (BID) | ORAL | 0 refills | Status: DC | PRN

## 2020-10-02 NOTE — Telephone Encounter (Signed)
Rx written for [facility], appt sch for next Friday at 10:45 and pt requested rx pain medication to go to [facility] please.

## 2020-10-02 NOTE — Telephone Encounter (Signed)
Pt called requesting a phone call from Autumn F. Please call pt at [phone].

## 2020-10-02 NOTE — Telephone Encounter (Signed)
done

## 2020-10-09 ENCOUNTER — Ambulatory Visit: Payer: Medicare Other | Admitting: Family

## 2020-11-13 ENCOUNTER — Telehealth: Payer: Self-pay | Admitting: Orthopedic Surgery

## 2020-11-13 ENCOUNTER — Other Ambulatory Visit: Payer: Self-pay | Admitting: Orthopedic Surgery

## 2020-11-13 MED ORDER — HYDROCODONE-ACETAMINOPHEN 5-325 MG PO TABS: 1.0000 | ORAL_TABLET | Freq: Two times a day (BID) | ORAL | 0 refills | Status: DC | PRN

## 2020-11-13 NOTE — Telephone Encounter (Signed)
I called patient and advised. 

## 2020-11-13 NOTE — Telephone Encounter (Signed)
Patient called needing Rx refilled (Vicodin) The number to contact patient is [phone] 

## 2020-12-09 ENCOUNTER — Telehealth: Payer: Self-pay | Admitting: Orthopedic Surgery

## 2020-12-09 MED ORDER — TRAMADOL HCL 50 MG PO TABS: 50.0000 mg | ORAL_TABLET | Freq: Two times a day (BID) | ORAL | 0 refills | Status: DC

## 2020-12-09 NOTE — Telephone Encounter (Signed)
Will send some tramadol Really trying to wean off vicodin

## 2020-12-09 NOTE — Telephone Encounter (Signed)
05/22/20 AKA asking for refill on pain medication last refill 11/13/20 Hydrocodone #10 please advise.

## 2020-12-09 NOTE — Telephone Encounter (Signed)
Patient called needing Rx refilled (Vicodin) The number to contact patient is [phone]

## 2020-12-09 NOTE — Telephone Encounter (Signed)
I called and lm on vm to advise of message below. To call with questions.  

## 2020-12-28 ENCOUNTER — Telehealth: Payer: Self-pay

## 2020-12-28 NOTE — Telephone Encounter (Signed)
Pt called into the office and would like to know if he can get a refill on his pain medication. He did not elaborate on the medication he wanted refilled he only stated pain medication.  Tramadol was the last thing that we refilled and that was on 10/26.   Please advise.

## 2020-12-29 MED ORDER — TRAMADOL HCL 50 MG PO TABS: 50.0000 mg | ORAL_TABLET | Freq: Two times a day (BID) | ORAL | 0 refills | Status: DC

## 2020-12-29 NOTE — Telephone Encounter (Signed)
Tramadol 50mg  last filled 12/09/20 with #20 BID. Hx Left AKA.

## 2021-01-29 ENCOUNTER — Telehealth: Payer: Self-pay | Admitting: Orthopedic Surgery

## 2021-01-29 MED ORDER — TRAMADOL HCL 50 MG PO TABS: 50.0000 mg | ORAL_TABLET | Freq: Two times a day (BID) | ORAL | 0 refills | Status: DC

## 2021-01-29 NOTE — Telephone Encounter (Signed)
Pt called requesting pain medication. Pt is also asking for a call back from Autumn F. Please call pt at [phone]

## 2021-01-29 NOTE — Telephone Encounter (Signed)
I called pt to advise of message below.  

## 2021-01-29 NOTE — Telephone Encounter (Signed)
Sent in refill, #10 Last rx

## 2021-01-29 NOTE — Telephone Encounter (Signed)
05/22/20 left AKA pt requesting refill of pain medication last refill 12/2020 tramadol please advise.

## 2021-05-30 ENCOUNTER — Emergency Department (HOSPITAL_COMMUNITY): Payer: 59

## 2021-05-30 ENCOUNTER — Emergency Department (HOSPITAL_COMMUNITY)
Admission: EM | Admit: 2021-05-30 | Discharge: 2021-05-30 | Disposition: A | Discharge status: Home / Self Care | Payer: 59 | Attending: Emergency Medicine | Admitting: Emergency Medicine

## 2021-05-30 ENCOUNTER — Encounter (HOSPITAL_COMMUNITY): Payer: Self-pay | Admitting: Emergency Medicine

## 2021-05-30 ENCOUNTER — Other Ambulatory Visit: Payer: Self-pay

## 2021-05-30 DIAGNOSIS — R0789 Other chest pain: Secondary | ICD-10-CM | POA: Insufficient documentation

## 2021-05-30 DIAGNOSIS — Z79899 Other long term (current) drug therapy: Secondary | ICD-10-CM | POA: Insufficient documentation

## 2021-05-30 DIAGNOSIS — Z7951 Long term (current) use of inhaled steroids: Secondary | ICD-10-CM | POA: Diagnosis not present

## 2021-05-30 DIAGNOSIS — I10 Essential (primary) hypertension: Secondary | ICD-10-CM | POA: Diagnosis not present

## 2021-05-30 DIAGNOSIS — J449 Chronic obstructive pulmonary disease, unspecified: Secondary | ICD-10-CM | POA: Diagnosis not present

## 2021-05-30 DIAGNOSIS — R0602 Shortness of breath: Secondary | ICD-10-CM | POA: Diagnosis not present

## 2021-05-30 DIAGNOSIS — R062 Wheezing: Secondary | ICD-10-CM | POA: Insufficient documentation

## 2021-05-30 DIAGNOSIS — J441 Chronic obstructive pulmonary disease with (acute) exacerbation: Secondary | ICD-10-CM

## 2021-05-30 LAB — BASIC METABOLIC PANEL
Anion gap: 8 (ref 5–15)
BUN: 7 mg/dL — ABNORMAL LOW (ref 8–23)
CO2: 32 mmol/L (ref 22–32)
Calcium: 9.6 mg/dL (ref 8.9–10.3)
Chloride: 98 mmol/L (ref 98–111)
Creatinine, Ser: 0.61 mg/dL (ref 0.61–1.24)
GFR, Estimated: >60 mL/min (ref >60)
Glucose, Bld: 148 mg/dL — ABNORMAL HIGH (ref 70–99)
Potassium: 3.9 mmol/L (ref 3.5–5.1)
Sodium: 138 mmol/L (ref 135–145)

## 2021-05-30 LAB — TROPONIN I (HIGH SENSITIVITY)
Troponin I (High Sensitivity): 7 ng/L (ref <18)
Troponin I (High Sensitivity): 4 ng/L

## 2021-05-30 LAB — CBC WITH DIFFERENTIAL/PLATELET
Abs Immature Granulocytes: 0.02 10*3/uL (ref 0.00–0.07)
Basophils Absolute: 0.1 10*3/uL (ref 0.0–0.1)
Basophils Relative: 1 %
Eosinophils Absolute: 0.3 10*3/uL (ref 0.0–0.5)
Eosinophils Relative: 5 %
HCT: 42.6 % (ref 39.0–52.0)
Hemoglobin: 12.9 g/dL — ABNORMAL LOW (ref 13.0–17.0)
Immature Granulocytes: 0 %
Lymphocytes Relative: 16 %
Lymphs Abs: 1 10*3/uL (ref 0.7–4.0)
MCH: 28.2 pg (ref 26.0–34.0)
MCHC: 30.3 g/dL (ref 30.0–36.0)
MCV: 93 fL (ref 80.0–100.0)
Monocytes Absolute: 0.4 10*3/uL (ref 0.1–1.0)
Monocytes Relative: 7 %
Neutro Abs: 4.6 10*3/uL (ref 1.7–7.7)
Neutrophils Relative %: 71 %
Platelets: 189 10*3/uL (ref 150–400)
RBC: 4.58 MIL/uL (ref 4.22–5.81)
RDW: 15.4 % (ref 11.5–15.5)
WBC: 6.5 10*3/uL (ref 4.0–10.5)
nRBC: 0 % (ref 0.0–0.2)

## 2021-05-30 LAB — URINALYSIS, ROUTINE W REFLEX MICROSCOPIC
Bilirubin Urine: NEGATIVE
Glucose, UA: NEGATIVE mg/dL
Hgb urine dipstick: NEGATIVE
Ketones, ur: NEGATIVE mg/dL
Leukocytes,Ua: NEGATIVE
Nitrite: NEGATIVE
Protein, ur: NEGATIVE mg/dL
Specific Gravity, Urine: 1.02 (ref 1.005–1.030)
pH: 5 (ref 5.0–8.0)

## 2021-05-30 LAB — BRAIN NATRIURETIC PEPTIDE: B Natriuretic Peptide: 26 pg/mL (ref 0.0–100.0)

## 2021-05-30 MED ORDER — METHYLPREDNISOLONE SODIUM SUCC 125 MG IJ SOLR
125.0000 mg | Freq: Once | INTRAMUSCULAR | Status: AC
  Administered 2021-05-30: 125 mg via INTRAVENOUS
  Filled 2021-05-30: qty 2

## 2021-05-30 MED ORDER — PREDNISONE 20 MG PO TABS: 40.0000 mg | ORAL_TABLET | Freq: Every day | ORAL | 0 refills | Status: AC

## 2021-05-30 MED ORDER — AMOXICILLIN-POT CLAVULANATE 875-125 MG PO TABS: 1.0000 | ORAL_TABLET | Freq: Two times a day (BID) | ORAL | 0 refills | Status: DC

## 2021-05-30 MED ORDER — BENZONATATE 100 MG PO CAPS
200.0000 mg | ORAL_CAPSULE | Freq: Once | ORAL | Status: AC
  Administered 2021-05-30: 200 mg via ORAL
  Filled 2021-05-30: qty 2

## 2021-05-30 MED ORDER — ALBUTEROL SULFATE HFA 108 (90 BASE) MCG/ACT IN AERS
2.0000 | INHALATION_SPRAY | RESPIRATORY_TRACT | Status: DC | PRN
  Administered 2021-05-30: 2 via RESPIRATORY_TRACT
  Filled 2021-05-30: qty 6.7

## 2021-05-30 MED ORDER — BENZONATATE 100 MG PO CAPS: 100.0000 mg | ORAL_CAPSULE | Freq: Three times a day (TID) | ORAL | 0 refills | Status: DC

## 2021-05-30 MED ORDER — MORPHINE SULFATE (PF) 4 MG/ML IV SOLN
4.0000 mg | Freq: Once | INTRAVENOUS | Status: AC
  Administered 2021-05-30: 4 mg via INTRAVENOUS
  Filled 2021-05-30: qty 1

## 2021-05-30 MED ORDER — IOHEXOL 350 MG/ML SOLN
100.0000 mL | Freq: Once | INTRAVENOUS | Status: AC | PRN
  Administered 2021-05-30: 100 mL via INTRAVENOUS

## 2021-05-30 MED ORDER — IPRATROPIUM-ALBUTEROL 0.5-2.5 (3) MG/3ML IN SOLN
3.0000 mL | Freq: Once | RESPIRATORY_TRACT | Status: AC
  Administered 2021-05-30: 3 mL via RESPIRATORY_TRACT
  Filled 2021-05-30: qty 3

## 2021-05-30 MED ORDER — AMOXICILLIN-POT CLAVULANATE 875-125 MG PO TABS
1.0000 | ORAL_TABLET | Freq: Once | ORAL | Status: AC
Start: 2021-05-30 — End: 2021-05-30
  Administered 2021-05-30: 1 via ORAL
  Filled 2021-05-30: qty 1

## 2021-05-30 NOTE — ED Provider Triage Note (Signed)
Emergency Medicine Provider Triage Evaluation Note ? ?[name] , a 66 y.o. male  was evaluated in triage.  Pt complains of right flank pain shortness of breath onset 2 days ago, patient reports right flank pain preceded shortness of breath he reports mild aching pain constant worsened with deep inspiration, does not radiate.  He denies any associated chest pain.  He reports this is associated with a cough with yellow sputum production for the past 2 days.  He reports he uses 3 L nasal cannula at home. ? ?Review of Systems  ?Positive: Cough, shortness of breath, right flank pain ?Negative: Fever, chills, chest pain, abdominal pain, nausea, vomiting, extremity swelling/color change or any additional concerns ? ?Physical Exam  ?BP (!) 132/92 (BP Location: Left Arm)   Pulse 97   Temp 98.3 ?F (36.8 ?C) (Oral)   Resp 18   SpO2 100%  ?Gen:   Awake, no distress   ?Resp:  Normal effort  ?MSK:   Moves extremities without difficulty  ?Other:  Decreased movement and wheezing present. ? ?Medical Decision Making  ?Possible COPD exacerbation, labs and x-ray ordered.  Breathing treatment ordered.  Nursing staff aware patient needs next room. ? ?Medically screening exam initiated at 1:01 PM.  Appropriate orders placed.  [name] was informed that the remainder of the evaluation will be completed by another provider, this initial triage assessment does not replace that evaluation, and the importance of remaining in the ED until their evaluation is complete. ? ? ?Note: Portions of this report may have been transcribed using voice recognition software. Every effort was made to ensure accuracy; however, inadvertent computerized transcription errors may still be present. ? ?  ?Bill Salinas, PA-C ?05/30/21 1314 ? ?

## 2021-05-30 NOTE — Discharge Instructions (Addendum)
Your work-up today was reassuring and did not show any concerning signs of your chest pain.  You had your heart enzymes drawn today which were normal.  You had a CT scan done of your chest which did not show any evidence of blood clots.  You most likely have COPD exacerbation given your new productive cough since yesterday.  I have sent antibiotics, cough medicine, prednisone into the pharmacy for you.  You got your first dose of antibiotic and cough medicine in the emergency room tonight.  If you have any worsening symptoms please return to the emergency room.  Otherwise follow-up with your primary care provider. ?

## 2021-05-30 NOTE — ED Provider Notes (Signed)
Signout received on status 66 year old male who presents with right posterior rib cage pain that is also pleuritic and associated with shortness of breath.  He does endorse productive cough that has been new since yesterday.  Denies chest pain.  He does have history of COPD.  And wears 3 L O2 at home.  At the time of signout patient is awaiting CTA PE study.  CBC without leukocytosis, BMP without acute findings.  BMP of 26.  Initial troponin of 7.  Chest x-ray without evidence of pneumonia. ? ?Physical Exam  ?BP (!) 139/92   Pulse 88   Temp 98.3 ?F (36.8 ?C) (Oral)   Resp (!) 25   SpO2 90%  ? ? ? ?Procedures  ?Procedures ? ?ED Course / MDM  ?  ?Medical Decision Making ?Amount and/or Complexity of Data Reviewed ?Labs: ordered. ?Radiology: ordered. ? ?Risk ?Prescription drug management. ? ? ?Troponin negative x2.  CTA PE study negative for PE.  Given patient's new productive cough since yesterday patient most likely has COPD exacerbation that is causing pleuritic chest pain and increasing shortness of breath.  We will provide him with antibiotics, prednisone, Tessalon Perles.  First dose of Tessalon Perles and Augmentin provided in the emergency room.  Patient is appropriate for discharge.  Discharged in stable condition.  Return precautions discussed. ? ? ? ? ?  ?Marita Kansas, PA-C ?05/30/21 1931 ? ?  ?Glendora Score, MD ?05/30/21 2043 ? ?

## 2021-05-30 NOTE — ED Notes (Signed)
Patient transported to CT 

## 2021-05-30 NOTE — ED Provider Notes (Signed)
?[facility] EMERGENCY DEPARTMENT ?Provider Note ? ? ?CSN: [id number] ?Arrival date & time: 05/30/21  1225 ? ?  ? ?History ? ?Chief Complaint  ?Patient presents with  ? Shortness of Breath  ? ? ?[name] is a 66 y.o. male with PMHx HTN, asthma/COPD, GERD who presents to the ED today via EMS for onset, constant, right-sided rib pain that began yesterday.  Patient also complains of increased shortness of breath as well as productive cough.  Denies any trauma to his ribs.  He does report the pain is worsened with deep inspiration which is causing worsening shortness of breath.  He typically wears 3 L home O2.  He has been using his albuterol inhaler as well as Advair without relief. Denies fevers or chills. Denies anterior chest pain.  ? ?The history is provided by the patient and medical records.  ? ?  ? ?Home Medications ?Prior to Admission medications   ?Medication Sig Start Date End Date Taking? Authorizing Provider  ?albuterol (PROVENTIL) (2.5 MG/3ML) 0.083% nebulizer solution Take 2.5 mg by nebulization every 6 (six) hours as needed for wheezing or shortness of breath.    [provider]  ?albuterol (VENTOLIN HFA) 108 (90 Base) MCG/ACT inhaler Inhale 2 puffs into the lungs every 4 (four) hours as needed for wheezing or shortness of breath. 06/09/20   Medina-Vargas, Monina C, NP  ?ascorbic acid (VITAMIN C) 1000 MG tablet Take 1 tablet (1,000 mg total) by mouth daily. 05/26/20   Persons, [place], PA  ?Fluticasone-Salmeterol (ADVAIR DISKUS) 250-50 MCG/DOSE AEPB Inhale 1 puff into the lungs 2 (two) times daily. 06/09/20   Medina-Vargas, Monina C, NP  ?HYDROcodone-acetaminophen (NORCO/VICODIN) 5-325 MG tablet Take 1 tablet by mouth every 12 (twelve) hours as needed for severe pain. 11/13/20   Nadara Mustard, MD  ?lisinopril (ZESTRIL) 20 MG tablet Take 1 tablet (20 mg total) by mouth daily with breakfast. 06/09/20   Medina-Vargas, Monina C, NP  ?nicotine (NICODERM CQ - DOSED IN MG/24  HOURS) 21 mg/24hr patch Place 1 patch (21 mg total) onto the skin daily. 06/09/20   Medina-Vargas, Monina C, NP  ?OXYGEN Inhale 1-3 L into the lungs See admin instructions. Use overnight and when resting in bed    [provider]  ?tiZANidine (ZANAFLEX) 4 MG tablet Take 1 tablet (4 mg total) by mouth every morning. 06/09/20   Medina-Vargas, Monina C, NP  ?traMADol (ULTRAM) 50 MG tablet Take 1 tablet (50 mg total) by mouth 2 (two) times daily. 01/29/21   Adonis Huguenin, NP  ?Vitamin D, Ergocalciferol, (DRISDOL) 1.25 MG (50000 UNIT) CAPS capsule Take 1 capsule (50,000 Units total) by mouth every 7 (seven) days. 06/09/20   Medina-Vargas, Monina C, NP  ?zinc sulfate 220 (50 Zn) MG capsule Take 1 capsule (220 mg total) by mouth daily. 06/09/20   Medina-Vargas, Monina C, NP  ?   ? ?Allergies    ?Patient has no known allergies.   ? ?Review of Systems   ?Review of Systems  ?Constitutional:  Negative for chills and fever.  ?Respiratory:  Positive for cough, shortness of breath and wheezing.   ?Cardiovascular:  Positive for chest pain (Right rib pain). Negative for palpitations and leg swelling.  ?All other systems reviewed and are negative. ? ?Physical Exam ?Updated Vital Signs ?BP 133/84   Pulse 97   Temp 98.3 ?F (36.8 ?C) (Oral)   Resp (!) 34   SpO2 100%  ?Physical Exam ?Vitals and nursing note reviewed.  ?Constitutional:   ?  Appearance: He is not ill-appearing or diaphoretic.  ?HENT:  ?   Head: Normocephalic and atraumatic.  ?Eyes:  ?   Conjunctiva/sclera: Conjunctivae normal.  ?Cardiovascular:  ?   Rate and Rhythm: Normal rate and regular rhythm.  ?   Pulses: Normal pulses.  ?Pulmonary:  ?   Breath sounds: Wheezing present.  ?   Comments: Currently on 3L [place]. Satting 100%. Speaking in shorter sentences s/2 pain. Wheezing throughout.  ?Chest:  ?   Chest wall: Tenderness present.  ?   Comments: + TTP to R posteriorlateral ribs. No crepitus. No ecchymosis.   ?Abdominal:  ?   Palpations: Abdomen is soft.  ?    Tenderness: There is no abdominal tenderness.  ?Musculoskeletal:  ?   Cervical back: Neck supple.  ?Skin: ?   General: Skin is warm and dry.  ?Neurological:  ?   Mental Status: He is alert.  ? ? ?ED Results / Procedures / Treatments   ?Labs ?(all labs ordered are listed, but only abnormal results are displayed) ?Labs Reviewed  ?BASIC METABOLIC PANEL - Abnormal; Notable for the following components:  ?    Result Value  ? Glucose, Bld 148 (*)   ? BUN 7 (*)   ? All other components within normal limits  ?CBC WITH DIFFERENTIAL/PLATELET - Abnormal; Notable for the following components:  ? Hemoglobin 12.9 (*)   ? All other components within normal limits  ?BRAIN NATRIURETIC PEPTIDE  ?URINALYSIS, ROUTINE W REFLEX MICROSCOPIC  ?TROPONIN I (HIGH SENSITIVITY)  ? ? ?EKG ?EKG Interpretation ? ?Date/Time:  Sunday May 30 2021 12:41:47 EDT ?Ventricular Rate:  107 ?PR Interval:  142 ?QRS Duration: 78 ?QT Interval:  324 ?QTC Calculation: 432 ?R Axis:   63 ?Text Interpretation: Sinus tachycardia Right atrial enlargement Anteroseptal infarct , age undetermined Abnormal ECG When compared with ECG of 03-Jul-2020 12:28, PREVIOUS ECG IS PRESENT Confirmed by Kristine Royal [phone]) on 05/30/2021 2:02:39 PM ? ?Radiology ?DG Chest Port 1 View ? ?Result Date: 05/30/2021 ?CLINICAL DATA:  66 year old male with history of cough and shortness of breath. EXAM: PORTABLE CHEST 1 VIEW COMPARISON:  Chest x-ray 03/23/2018. FINDINGS: Lung volumes are increased with emphysematous changes. No consolidative airspace disease. No pleural effusions. No pneumothorax. No pulmonary nodule or mass noted. Pulmonary vasculature and the cardiomediastinal silhouette are within normal limits. Atherosclerotic calcifications in the thoracic aorta. IMPRESSION: 1.  No radiographic evidence of acute cardiopulmonary disease. 2. Aortic atherosclerosis. 3. Emphysema. Electronically Signed   By: Trudie Reed M.D.   On: 05/30/2021 13:36   ? ?Procedures ?Procedures   ? ? ?Medications Ordered in ED ?Medications  ?albuterol (VENTOLIN HFA) 108 (90 Base) MCG/ACT inhaler 2 puff (has no administration in time range)  ?ipratropium-albuterol (DUONEB) 0.5-2.5 (3) MG/3ML nebulizer solution 3 mL (3 mLs Nebulization Given 05/30/21 1422)  ?morphine (PF) 4 MG/ML injection 4 mg (4 mg Intravenous Given 05/30/21 1417)  ?methylPREDNISolone sodium succinate (SOLU-MEDROL) 125 mg/2 mL injection 125 mg (125 mg Intravenous Given 05/30/21 1415)  ? ? ?ED Course/ Medical Decision Making/ A&P ?  ?                        ?Medical Decision Making ?66 year old male who presents to the ED today with complaint of right-sided rib pain that began yesterday with associated shortness of breath as well as a productive cough.  On arrival to the ED patient on his typical 3 L nasal cannula.  Appears to have wheezing throughout.  Breathing treatment  ordered in triage as well as CBC, BMP, BNP, troponin, chest x-ray. ? ?Chest x-ray with no acute findings. ?CBC without leukocytosis.  Hemoglobin improved from previous at 12.9 ?BMP without electrolyte abnormalities. ?BNP 26. ?Troponin of 7. ? ?On exam he has right posterior lateral rib tenderness palpation without crepitus or deformity.  No trauma.  He is speaking in shorter sentences secondary to pain.  Mild wheezing throughout.  We will plan for pain medication, Solu-Medrol, breathing treatment and reevaluation.  Given work-up overall negative however patient with right-sided rib pain without trauma does pleuritic in nature there is concern for PE.  We will plan for CTA for further evaluation. ? ? ?At shift change case signed out to Hormel Foodsmjad Ali, [place]-C, who will follow up on CTA.  ? ? ? ? ? ? ? ?Final Clinical Impression(s) / ED Diagnoses ?Final diagnoses:  ?None  ? ? ?Rx / DC Orders ?ED Discharge Orders   ? ? None  ? ?  ? ? ?  ?Tanda RockersVenter, Zael Shuman, PA-C ?05/30/21 1504 ? ?  ?Wynetta FinesMessick, Peter C, MD ?06/02/21 1403 ? ?

## 2021-05-30 NOTE — ED Triage Notes (Signed)
Pt to triage via [facility] from home.  Reports SOB and R sided flank pain with inspiration x 24 hours.  Using inhalers without relief.  Wears 3 liters home O2. ? ?91% RA ?97% on 3 liters ?22g L hand ?

## 2021-12-15 IMAGING — DX DG CHEST 1V PORT
1 series · 1 of 1 positions shown · non-contrast
Comparison: Chest x-rays dated 07/13/2019 and 06/01/2019.

CLINICAL DATA: RIGHT upper chest pain.  History of COPD.

EXAM:
PORTABLE CHEST 1 VIEW

[chest]
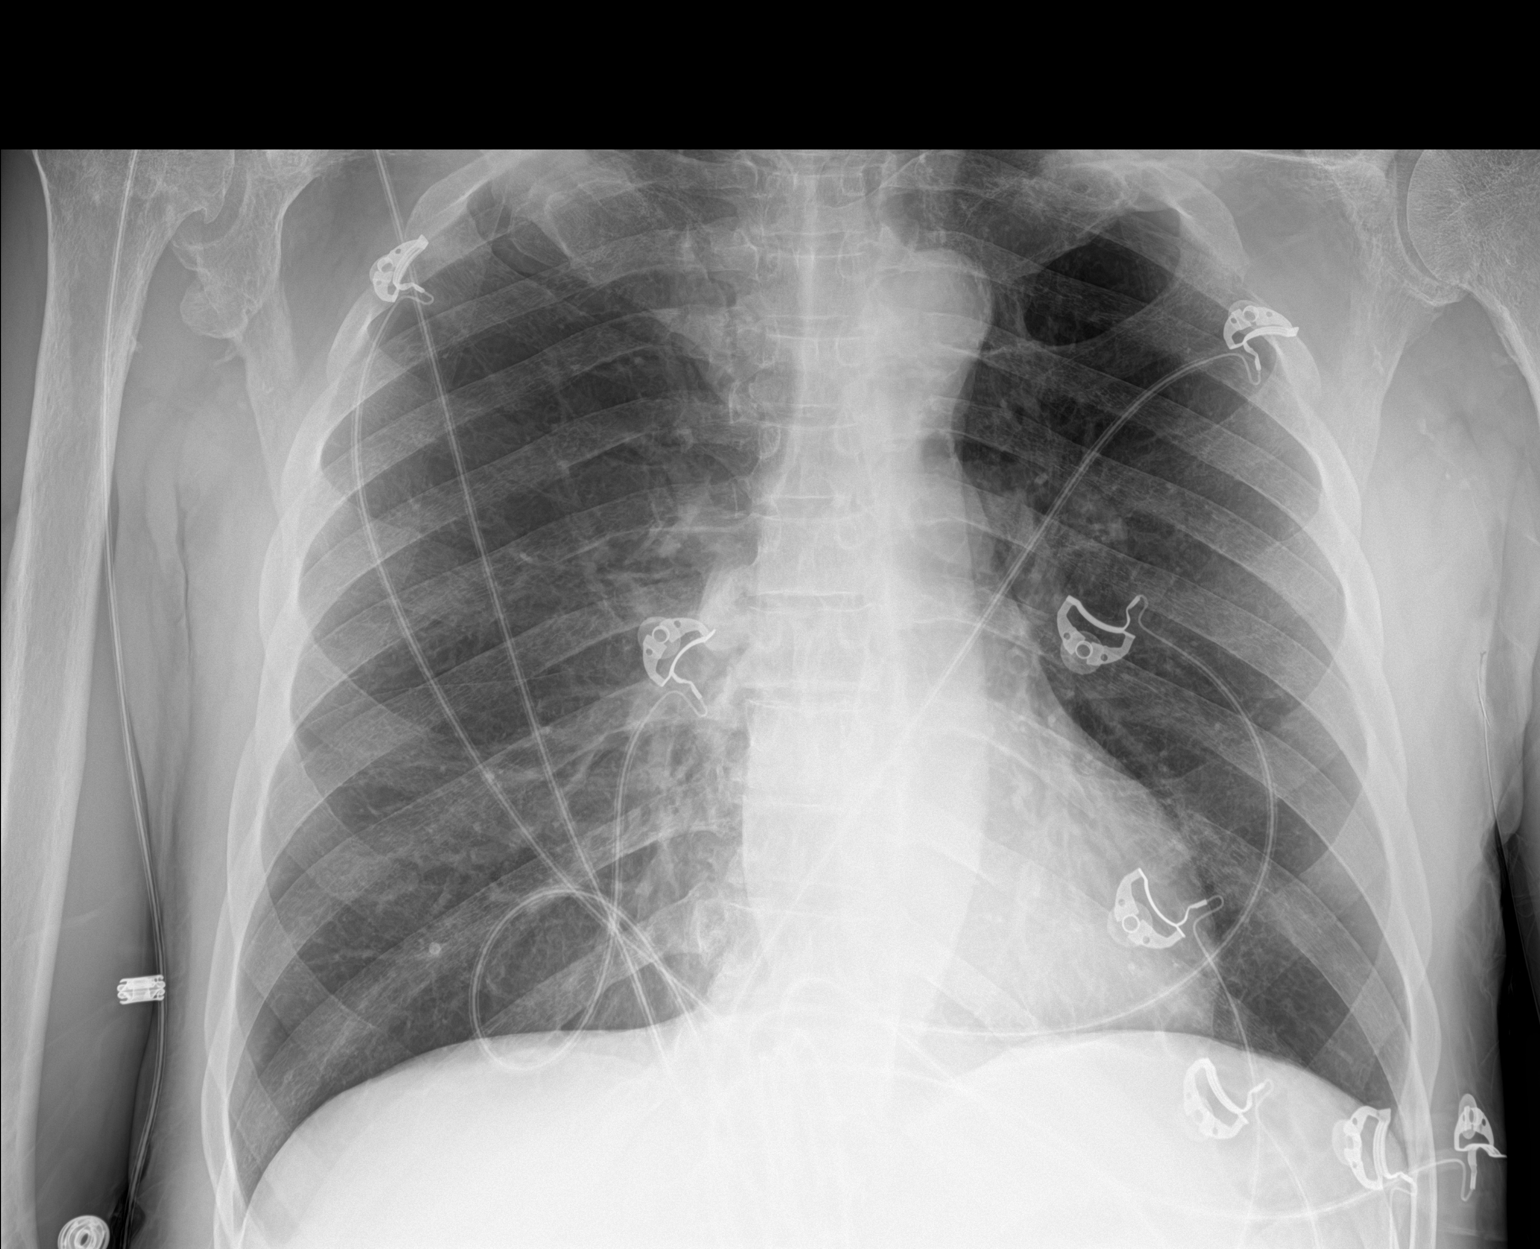

[1 of 1 positions shown; findings below may reference images not displayed]

FINDINGS: Heart size and mediastinal contours are stable. Lungs are clear. No
pleural effusion or pneumothorax is seen. No acute appearing osseous
abnormality. Chronic degenerative osteoarthritic changes at the
RIGHT shoulder.
IMPRESSION: No active cardiopulmonary disease. No evidence of pneumonia or
pulmonary edema.

## 2022-07-25 ENCOUNTER — Emergency Department (HOSPITAL_COMMUNITY)
Admission: EM | Admit: 2022-07-25 | Discharge: 2022-07-25 | Disposition: A | Discharge status: Home / Self Care | Payer: 59 | Source: Home / Self Care | Attending: Emergency Medicine | Admitting: Emergency Medicine

## 2022-07-25 ENCOUNTER — Emergency Department (HOSPITAL_COMMUNITY): Payer: 59

## 2022-07-25 DIAGNOSIS — Z7951 Long term (current) use of inhaled steroids: Secondary | ICD-10-CM | POA: Insufficient documentation

## 2022-07-25 DIAGNOSIS — Z79899 Other long term (current) drug therapy: Secondary | ICD-10-CM | POA: Insufficient documentation

## 2022-07-25 DIAGNOSIS — R0602 Shortness of breath: Secondary | ICD-10-CM | POA: Insufficient documentation

## 2022-07-25 DIAGNOSIS — I10 Essential (primary) hypertension: Secondary | ICD-10-CM | POA: Insufficient documentation

## 2022-07-25 DIAGNOSIS — R062 Wheezing: Secondary | ICD-10-CM | POA: Diagnosis not present

## 2022-07-25 DIAGNOSIS — J449 Chronic obstructive pulmonary disease, unspecified: Secondary | ICD-10-CM | POA: Insufficient documentation

## 2022-07-25 DIAGNOSIS — J441 Chronic obstructive pulmonary disease with (acute) exacerbation: Secondary | ICD-10-CM | POA: Diagnosis not present

## 2022-07-25 LAB — BASIC METABOLIC PANEL
Anion gap: 10 (ref 5–15)
BUN: 8 mg/dL (ref 8–23)
CO2: 31 mmol/L (ref 22–32)
Calcium: 9.4 mg/dL (ref 8.9–10.3)
Chloride: 97 mmol/L — ABNORMAL LOW (ref 98–111)
Creatinine, Ser: 0.47 mg/dL — ABNORMAL LOW (ref 0.61–1.24)
GFR, Estimated: >60 mL/min (ref >60)
Glucose, Bld: 129 mg/dL — ABNORMAL HIGH (ref 70–99)
Potassium: 4 mmol/L (ref 3.5–5.1)
Sodium: 138 mmol/L (ref 135–145)

## 2022-07-25 LAB — CBC
HCT: 41.9 % (ref 39.0–52.0)
Hemoglobin: 12.6 g/dL — ABNORMAL LOW (ref 13.0–17.0)
MCH: 29.1 pg (ref 26.0–34.0)
MCHC: 30.1 g/dL (ref 30.0–36.0)
MCV: 96.8 fL (ref 80.0–100.0)
Platelets: 217 10*3/uL (ref 150–400)
RBC: 4.33 MIL/uL (ref 4.22–5.81)
RDW: 15 % (ref 11.5–15.5)
WBC: 7 10*3/uL (ref 4.0–10.5)
nRBC: 0 % (ref 0.0–0.2)

## 2022-07-25 MED ORDER — DOXYCYCLINE HYCLATE 100 MG PO TABS
100.0000 mg | ORAL_TABLET | Freq: Once | ORAL | Status: AC
  Administered 2022-07-25: 100 mg via ORAL
  Filled 2022-07-25: qty 1

## 2022-07-25 MED ORDER — DOXYCYCLINE HYCLATE 100 MG PO CAPS: 100.0000 mg | ORAL_CAPSULE | Freq: Two times a day (BID) | ORAL | 0 refills | Status: DC

## 2022-07-25 MED ORDER — IPRATROPIUM-ALBUTEROL 0.5-2.5 (3) MG/3ML IN SOLN
3.0000 mL | Freq: Once | RESPIRATORY_TRACT | Status: AC
Start: 2022-07-25 — End: 2022-07-25
  Administered 2022-07-25: 3 mL via RESPIRATORY_TRACT
  Filled 2022-07-25: qty 3

## 2022-07-25 NOTE — ED Notes (Addendum)
Spoke to daughter on phone. When stating patient would be discharged and we have set up transportation for him daughter stated "okay".

## 2022-07-25 NOTE — ED Provider Triage Note (Signed)
Emergency Medicine Provider Triage Evaluation Note  [name] , a 67 y.o. male  was evaluated in triage.  Pt complains of shortness of breath began this morning.  Patient reports history of COPD, chronically wears 2 L of oxygen.  Patient reports this morning he increased his oxygen to 3 L/min and it resolved his shortness of breath.  He denies any chest pain, fevers, cough, nausea or vomiting at home.  Review of Systems  Positive:  Negative:   Physical Exam  BP (!) 133/91 (BP Location: Right Arm)   Pulse 98   Temp 98.2 F (36.8 C) (Oral)   Resp 19   SpO2 95%  Gen:   Awake, no distress   Resp:  Normal effort  MSK:   Moves extremities without difficulty  Other:    Medical Decision Making  Medically screening exam initiated at 5:04 PM.  Appropriate orders placed.  [name] was informed that the remainder of the evaluation will be completed by another provider, this initial triage assessment does not replace that evaluation, and the importance of remaining in the ED until their evaluation is complete.     Al Decant, PA-C 07/25/22 1705

## 2022-07-25 NOTE — Discharge Instructions (Signed)
Return to the ED with any new or worsening signs or symptoms Please continue wearing home oxygen as directed Please follow-up with your pulmonology/PCP team for further management and reevaluation Please pick up doxycycline.  This has been sent to [facility] on [address].  This is for your left-sided amputation.  Please follow-up with your PCP regarding this

## 2022-07-25 NOTE — ED Notes (Signed)
Attempted to call daughter again with no answer. Spoke to out of town daughter on phone who stated if we cannot get ahold of that certain daughter then there would be no one to pick him up.

## 2022-07-25 NOTE — ED Notes (Signed)
Attempted to call daughter for pick up. Daughter did not answer.

## 2022-07-25 NOTE — ED Notes (Signed)
Ptar called 

## 2022-07-25 NOTE — ED Triage Notes (Signed)
Pt to ED via EMS from home. Pt c/o SOB while resting. Pt on 2L [place] baseline, but was not wearing his O2 when EMS arrived on scene. Pt has hx of COPD and asthma. Pt also c/o not painful "knott" on top of head. Pt denies fall / injury. Pt also c/o productive cough. Pt denies pain.   EMS Vitals: 98% 2L 101 HR 137/78

## 2022-07-25 NOTE — ED Provider Notes (Signed)
[place] EMERGENCY DEPARTMENT AT [facility] Provider Note   CSN: [id number] Arrival date & time: 07/25/22  1556     History  Chief Complaint  Patient presents with   Shortness of Breath    [name] is a 67 y.o. male with medical history of COPD, chronic leg pain, GERD, history of home oxygen, hypertension.  Patient presents to ED for evaluation of shortness of breath.  The patient reports that this morning he woke up short of breath.  He states that he typically wears 2 L of oxygen and did increase to 3 L this morning secondary to his shortness of breath.  Per triage note, it appears that the patient was not wearing his oxygen when EMS arrived on scene.  The patient denies this to me, he states that he always wears his oxygen.  He states that currently on examination he feels much better today than he did this morning.  He denies any shortness of breath or chest pain at this time.  He denies any nausea, vomiting, lightheadedness, dizziness, weakness.  He is requesting discharge.   Shortness of Breath Associated symptoms: no chest pain and no fever        Home Medications Prior to Admission medications   Medication Sig Start Date End Date Taking? Authorizing Provider  doxycycline (VIBRAMYCIN) 100 MG capsule Take 1 capsule (100 mg total) by mouth 2 (two) times daily. 07/25/22  Yes Al Decant, PA-C  albuterol (PROVENTIL) (2.5 MG/3ML) 0.083% nebulizer solution Take 2.5 mg by nebulization every 6 (six) hours as needed for wheezing or shortness of breath.    [provider]  albuterol (VENTOLIN HFA) 108 (90 Base) MCG/ACT inhaler Inhale 2 puffs into the lungs every 4 (four) hours as needed for wheezing or shortness of breath. 06/09/20   Medina-Vargas, Monina C, NP  amoxicillin-clavulanate (AUGMENTIN) 875-125 MG tablet Take 1 tablet by mouth every 12 (twelve) hours. 05/30/21   Marita [place], PA-C  ascorbic acid (VITAMIN C) 1000 MG tablet Take 1 tablet (1,000  mg total) by mouth daily. 05/26/20   Persons, [place], PA  benzonatate (TESSALON) 100 MG capsule Take 1 capsule (100 mg total) by mouth every 8 (eight) hours. 05/30/21   Marita [place], PA-C  Fluticasone-Salmeterol (ADVAIR DISKUS) 250-50 MCG/DOSE AEPB Inhale 1 puff into the lungs 2 (two) times daily. 06/09/20   Medina-Vargas, Monina C, NP  HYDROcodone-acetaminophen (NORCO/VICODIN) 5-325 MG tablet Take 1 tablet by mouth every 12 (twelve) hours as needed for severe pain. 11/13/20   Nadara Mustard, MD  lisinopril (ZESTRIL) 20 MG tablet Take 1 tablet (20 mg total) by mouth daily with breakfast. 06/09/20   Medina-Vargas, Monina C, NP  nicotine (NICODERM CQ - DOSED IN MG/24 HOURS) 21 mg/24hr patch Place 1 patch (21 mg total) onto the skin daily. 06/09/20   Medina-Vargas, Monina C, NP  OXYGEN Inhale 1-3 L into the lungs See admin instructions. Use overnight and when resting in bed    [provider]  tiZANidine (ZANAFLEX) 4 MG tablet Take 1 tablet (4 mg total) by mouth every morning. 06/09/20   Medina-Vargas, Monina C, NP  traMADol (ULTRAM) 50 MG tablet Take 1 tablet (50 mg total) by mouth 2 (two) times daily. 01/29/21   Adonis Huguenin, NP  Vitamin D, Ergocalciferol, (DRISDOL) 1.25 MG (50000 UNIT) CAPS capsule Take 1 capsule (50,000 Units total) by mouth every 7 (seven) days. 06/09/20   Medina-Vargas, Monina C, NP  zinc sulfate 220 (50 Zn) MG  capsule Take 1 capsule (220 mg total) by mouth daily. 06/09/20   Medina-Vargas, Monina C, NP      Allergies    Patient has no known allergies.    Review of Systems   Review of Systems  Constitutional:  Negative for fever.  Respiratory:  Positive for shortness of breath.   Cardiovascular:  Negative for chest pain.  Neurological:  Negative for dizziness, weakness and light-headedness.  All other systems reviewed and are negative.   Physical Exam Updated Vital Signs BP (!) 173/98   Pulse 78   Temp 98.1 F (36.7 C) (Oral)   Resp (!) 26   SpO2 100%  Physical  Exam Vitals and nursing note reviewed.  Constitutional:      General: He is not in acute distress.    Appearance: Normal appearance. He is not ill-appearing, toxic-appearing or diaphoretic.  HENT:     Head: Normocephalic and atraumatic.     Nose: Nose normal.     Mouth/Throat:     Mouth: Mucous membranes are moist.     Pharynx: Oropharynx is clear.  Eyes:     Extraocular Movements: Extraocular movements intact.     Conjunctiva/sclera: Conjunctivae normal.     Pupils: Pupils are equal, round, and reactive to light.  Cardiovascular:     Rate and Rhythm: Normal rate and regular rhythm.  Pulmonary:     Effort: Pulmonary effort is normal.     Comments: Distant lung sounds Abdominal:     General: Abdomen is flat. Bowel sounds are normal.     Palpations: Abdomen is soft.     Tenderness: There is no abdominal tenderness.  Musculoskeletal:     Cervical back: Normal range of motion and neck supple. No tenderness.  Skin:    General: Skin is warm and dry.  Neurological:     Mental Status: He is alert and oriented to person, place, and time.     ED Results / Procedures / Treatments   Labs (all labs ordered are listed, but only abnormal results are displayed) Labs Reviewed  BASIC METABOLIC PANEL - Abnormal; Notable for the following components:      Result Value   Chloride 97 (*)    Glucose, Bld 129 (*)    Creatinine, Ser 0.47 (*)    All other components within normal limits  CBC - Abnormal; Notable for the following components:   Hemoglobin 12.6 (*)    All other components within normal limits    EKG None  Radiology DG Chest 2 View  Result Date: 07/25/2022 CLINICAL DATA:  Shortness of breath, chest pain EXAM: CHEST - 2 VIEW COMPARISON:  Previous studies including the examination of 05/30/2021 FINDINGS: Cardiac size is within normal limits. Increase in AP diameter of chest suggests COPD. There are no signs of pulmonary edema or focal pulmonary consolidation. There is no  pleural effusion or pneumothorax. Deformity in the right scapula may be residual from previous injury. Degenerative changes are noted in right shoulder. 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 IMPRESSION: COPD. There are no signs of pulmonary edema or focal pulmonary consolidation. Electronically Signed   By: Ernie Avena M.D.   On: 07/25/2022 18:00    Procedures Procedures   Medications Ordered in ED Medications  doxycycline (VIBRA-TABS) tablet 100 mg (has no administration in time range)  ipratropium-albuterol (DUONEB) 0.5-2.5 (3) MG/3ML nebulizer solution 3 mL (3 mLs Nebulization Given 07/25/22 2108)    ED Course/ Medical Decision Making/ A&P  Medical Decision  Making Amount and/or Complexity of Data Reviewed Labs: ordered. Radiology: ordered.   CT 7-year male presents to the ED for evaluation of shortness of breath.  Please see HPI for further details.  On examination patient afebrile and nontachycardic.  His lung sounds are very distant bilaterally, he has no wheezing, he is chronically wearing 2 L of oxygen and he is satting 99%.  His abdomen is soft and compressible throughout.  His neurological examination is unremarkable.  Patient chest x-ray shows findings consistent with COPD.  No acute abnormalities noted.  His blood count shows no leukocytosis or anemia.  His metabolic panel shows no electrolyte derangement, creatinine elevation.  His EKG is unchanged from prior nonischemic.  Patient given DuoNeb treatment.  The patient reports that he longer feels short of breath.  He states that this morning he quite possibly was not wearing his oxygen, he states he cannot remember.  The patient will be sent home where he has home oxygen.  He will be advised to follow-up with his PCP regarding his shortness of breath.  He was advised to return to the ED with any new or worsening signs or symptoms and he voiced understanding of my instructions.  He is stable to discharge  home.  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  He reports that pus is draining from this area.  Will we will place the patient on doxycycline which she will take twice a day for the next 7 days.  Addendum: When I discharged the patient home, nursing staff notified me that the patient's daughter had become very upset at 1 point stating that the patient home did not have any power at this time.  Patient daughter never brought this up to me, she was in the room during examination and interview. The patient daughter apparently is trying to get the patient to come and live with her but the patient denies wanting to do this.  I reengaged with the patient who reports that he does have power at his house, he does have home oxygen at home in oxygen canisters.  He states he does not want to live with his daughter because she "is controlling".  Patient daughter has left the hospital and is no longer available for me to speak with.  I tried calling her number on the demographics form but no one answered.  The patient will be discharged at this time.   Final Clinical Impression(s) / ED Diagnoses Final diagnoses:  Shortness of breath    Rx / DC Orders ED Discharge Orders          Ordered    doxycycline (VIBRAMYCIN) 100 MG capsule  2 times daily        07/25/22 2159              Al Decant, PA-C 07/25/22 2204    Rozelle Logan, DO 07/26/22 [phone]

## 2022-07-26 ENCOUNTER — Other Ambulatory Visit: Payer: Self-pay

## 2022-07-26 ENCOUNTER — Inpatient Hospital Stay (HOSPITAL_COMMUNITY)
Admission: EM | Admit: 2022-07-26 | Discharge: 2022-08-02 | DRG: 191 | Disposition: A | Discharge status: Home Health | Payer: 59 | Attending: Internal Medicine | Admitting: Internal Medicine

## 2022-07-26 ENCOUNTER — Emergency Department (HOSPITAL_COMMUNITY): Payer: 59

## 2022-07-26 ENCOUNTER — Encounter (HOSPITAL_COMMUNITY): Payer: Self-pay

## 2022-07-26 DIAGNOSIS — E871 Hypo-osmolality and hyponatremia: Secondary | ICD-10-CM | POA: Diagnosis not present

## 2022-07-26 DIAGNOSIS — J441 Chronic obstructive pulmonary disease with (acute) exacerbation: Principal | ICD-10-CM | POA: Diagnosis present

## 2022-07-26 DIAGNOSIS — R0602 Shortness of breath: Principal | ICD-10-CM

## 2022-07-26 DIAGNOSIS — Z87891 Personal history of nicotine dependence: Secondary | ICD-10-CM

## 2022-07-26 DIAGNOSIS — Z7951 Long term (current) use of inhaled steroids: Secondary | ICD-10-CM

## 2022-07-26 DIAGNOSIS — I10 Essential (primary) hypertension: Secondary | ICD-10-CM | POA: Diagnosis not present

## 2022-07-26 DIAGNOSIS — L89899 Pressure ulcer of other site, unspecified stage: Secondary | ICD-10-CM | POA: Diagnosis present

## 2022-07-26 DIAGNOSIS — Z96652 Presence of left artificial knee joint: Secondary | ICD-10-CM | POA: Diagnosis present

## 2022-07-26 DIAGNOSIS — Z1152 Encounter for screening for COVID-19: Secondary | ICD-10-CM

## 2022-07-26 DIAGNOSIS — J9611 Chronic respiratory failure with hypoxia: Secondary | ICD-10-CM

## 2022-07-26 DIAGNOSIS — Z9981 Dependence on supplemental oxygen: Secondary | ICD-10-CM

## 2022-07-26 DIAGNOSIS — Z89612 Acquired absence of left leg above knee: Secondary | ICD-10-CM

## 2022-07-26 DIAGNOSIS — K219 Gastro-esophageal reflux disease without esophagitis: Secondary | ICD-10-CM | POA: Diagnosis present

## 2022-07-26 DIAGNOSIS — F17210 Nicotine dependence, cigarettes, uncomplicated: Secondary | ICD-10-CM

## 2022-07-26 DIAGNOSIS — L928 Other granulomatous disorders of the skin and subcutaneous tissue: Secondary | ICD-10-CM | POA: Diagnosis present

## 2022-07-26 DIAGNOSIS — G8929 Other chronic pain: Secondary | ICD-10-CM | POA: Diagnosis present

## 2022-07-26 DIAGNOSIS — S78112A Complete traumatic amputation at level between left hip and knee, initial encounter: Secondary | ICD-10-CM | POA: Diagnosis not present

## 2022-07-26 DIAGNOSIS — Z87820 Personal history of traumatic brain injury: Secondary | ICD-10-CM

## 2022-07-26 DIAGNOSIS — R062 Wheezing: Secondary | ICD-10-CM

## 2022-07-26 DIAGNOSIS — T8789 Other complications of amputation stump: Secondary | ICD-10-CM | POA: Diagnosis present

## 2022-07-26 DIAGNOSIS — Z79899 Other long term (current) drug therapy: Secondary | ICD-10-CM

## 2022-07-26 DIAGNOSIS — E559 Vitamin D deficiency, unspecified: Secondary | ICD-10-CM | POA: Diagnosis present

## 2022-07-26 LAB — COMPREHENSIVE METABOLIC PANEL
ALT: 10 U/L (ref 0–44)
AST: 22 U/L (ref 15–41)
Albumin: 4.3 g/dL (ref 3.5–5.0)
Alkaline Phosphatase: 91 U/L (ref 38–126)
Anion gap: 11 (ref 5–15)
BUN: 9 mg/dL (ref 8–23)
CO2: 28 mmol/L (ref 22–32)
Calcium: 9.7 mg/dL (ref 8.9–10.3)
Chloride: 97 mmol/L — ABNORMAL LOW (ref 98–111)
Creatinine, Ser: 0.53 mg/dL — ABNORMAL LOW (ref 0.61–1.24)
GFR, Estimated: >60 mL/min (ref >60)
Glucose, Bld: 105 mg/dL — ABNORMAL HIGH (ref 70–99)
Potassium: 4 mmol/L (ref 3.5–5.1)
Sodium: 136 mmol/L (ref 135–145)
Total Bilirubin: 0.5 mg/dL (ref 0.3–1.2)
Total Protein: 8 g/dL (ref 6.5–8.1)

## 2022-07-26 LAB — CBC WITH DIFFERENTIAL/PLATELET
Abs Immature Granulocytes: 0.03 10*3/uL (ref 0.00–0.07)
Basophils Absolute: 0 10*3/uL (ref 0.0–0.1)
Basophils Relative: 1 %
Eosinophils Absolute: 0.2 10*3/uL (ref 0.0–0.5)
Eosinophils Relative: 3 %
HCT: 44.5 % (ref 39.0–52.0)
Hemoglobin: 13.4 g/dL (ref 13.0–17.0)
Immature Granulocytes: 1 %
Lymphocytes Relative: 23 %
Lymphs Abs: 1.5 10*3/uL (ref 0.7–4.0)
MCH: 28.8 pg (ref 26.0–34.0)
MCHC: 30.1 g/dL (ref 30.0–36.0)
MCV: 95.5 fL (ref 80.0–100.0)
Monocytes Absolute: 0.4 10*3/uL (ref 0.1–1.0)
Monocytes Relative: 6 %
Neutro Abs: 4.4 10*3/uL (ref 1.7–7.7)
Neutrophils Relative %: 66 %
Platelets: 224 10*3/uL (ref 150–400)
RBC: 4.66 MIL/uL (ref 4.22–5.81)
RDW: 15.1 % (ref 11.5–15.5)
WBC: 6.5 10*3/uL (ref 4.0–10.5)
nRBC: 0 % (ref 0.0–0.2)

## 2022-07-26 LAB — TROPONIN I (HIGH SENSITIVITY)
Troponin I (High Sensitivity): 4 ng/L (ref <18)
Troponin I (High Sensitivity): 5 ng/L (ref <18)

## 2022-07-26 LAB — LACTIC ACID, PLASMA: Lactic Acid, Venous: 1.5 mmol/L (ref 0.5–1.9)

## 2022-07-26 MED ORDER — ONDANSETRON HCL 4 MG/2ML IJ SOLN: 4.0000 mg | Freq: Four times a day (QID) | INTRAMUSCULAR | Status: DC | PRN

## 2022-07-26 MED ORDER — OXYCODONE HCL 5 MG PO TABS
5.0000 mg | ORAL_TABLET | Freq: Once | ORAL | Status: AC
  Administered 2022-07-26: 5 mg via ORAL
  Filled 2022-07-26: qty 1

## 2022-07-26 MED ORDER — LISINOPRIL 20 MG PO TABS
20.0000 mg | ORAL_TABLET | Freq: Every day | ORAL | Status: DC
  Administered 2022-07-27 – 2022-08-02 (×7): 20 mg via ORAL
  Filled 2022-07-26 (×7): qty 1

## 2022-07-26 MED ORDER — PREDNISONE 20 MG PO TABS
40.0000 mg | ORAL_TABLET | Freq: Every day | ORAL | Status: DC
  Administered 2022-07-27: 40 mg via ORAL
  Filled 2022-07-26: qty 2

## 2022-07-26 MED ORDER — SODIUM CHLORIDE 0.9 % IV SOLN
1.0000 g | Freq: Every day | INTRAVENOUS | Status: DC
  Administered 2022-07-26: 1 g via INTRAVENOUS
  Filled 2022-07-26: qty 10

## 2022-07-26 MED ORDER — IPRATROPIUM-ALBUTEROL 0.5-2.5 (3) MG/3ML IN SOLN
3.0000 mL | Freq: Once | RESPIRATORY_TRACT | Status: AC
  Administered 2022-07-26: 3 mL via RESPIRATORY_TRACT
  Filled 2022-07-26: qty 3

## 2022-07-26 MED ORDER — ACETAMINOPHEN 500 MG PO TABS
1000.0000 mg | ORAL_TABLET | Freq: Once | ORAL | Status: AC
  Administered 2022-07-26: 1000 mg via ORAL
  Filled 2022-07-26: qty 2

## 2022-07-26 MED ORDER — IPRATROPIUM-ALBUTEROL 0.5-2.5 (3) MG/3ML IN SOLN
3.0000 mL | Freq: Four times a day (QID) | RESPIRATORY_TRACT | Status: DC
  Administered 2022-07-27 – 2022-07-30 (×16): 3 mL via RESPIRATORY_TRACT
  Filled 2022-07-26 (×16): qty 3

## 2022-07-26 MED ORDER — HEPARIN SODIUM (PORCINE) 5000 UNIT/ML IJ SOLN
5000.0000 [IU] | Freq: Three times a day (TID) | INTRAMUSCULAR | Status: DC
  Administered 2022-07-26 – 2022-08-02 (×20): 5000 [IU] via SUBCUTANEOUS
  Filled 2022-07-26 (×20): qty 1

## 2022-07-26 MED ORDER — MAGNESIUM SULFATE 2 GM/50ML IV SOLN
2.0000 g | Freq: Once | INTRAVENOUS | Status: AC
  Administered 2022-07-26: 2 g via INTRAVENOUS
  Filled 2022-07-26: qty 50

## 2022-07-26 MED ORDER — SODIUM CHLORIDE 0.9 % IV SOLN
500.0000 mg | Freq: Once | INTRAVENOUS | Status: AC
  Administered 2022-07-26: 500 mg via INTRAVENOUS
  Filled 2022-07-26: qty 5

## 2022-07-26 MED ORDER — ACETAMINOPHEN 650 MG RE SUPP: 650.0000 mg | Freq: Four times a day (QID) | RECTAL | Status: DC | PRN

## 2022-07-26 MED ORDER — ONDANSETRON HCL 4 MG PO TABS: 4.0000 mg | ORAL_TABLET | Freq: Four times a day (QID) | ORAL | Status: DC | PRN

## 2022-07-26 MED ORDER — ACETAMINOPHEN 325 MG PO TABS
650.0000 mg | ORAL_TABLET | Freq: Four times a day (QID) | ORAL | Status: DC | PRN
  Administered 2022-07-27 – 2022-08-01 (×7): 650 mg via ORAL
  Filled 2022-07-26 (×7): qty 2

## 2022-07-26 NOTE — Progress Notes (Signed)
New Admission Note:   Arrival Method: Stretcher Mental Orientation: alert x 4 Telemetry: box 3 Assessment: Completed Skin: see flowsheet IV: NSL Pain: none Tubes: none Safety Measures: Safety Fall Prevention Plan has been discussed Admission: Completed [facility] [place] Orientation: Patient has been orientated to the room, unit and staff.  Family: none at bedside  Orders have been reviewed and implemented. Will continue to monitor the patient. Call light has been placed within reach and bed alarm has been activated.   Artemio Aly BSN, RN Phone number: [phone] Admission Note:

## 2022-07-26 NOTE — ED Provider Notes (Signed)
[place] [facility]MENT AT [facility] Provider Note   CSN: [id number] Arrival date & time: 07/26/22  1350     History  No chief complaint on file.   [name] is a 67 y.o. male.  The history is provided by the patient and medical records. No language interpreter was used.  Shortness of Breath Severity:  Moderate Onset quality:  Gradual Timing:  Constant Progression:  Unchanged Chronicity:  New Context: URI   Relieved by:  Nothing Worsened by:  Nothing Ineffective treatments:  Inhaler Associated symptoms: cough, sputum production and wheezing   Associated symptoms: no abdominal pain, no chest pain, no fever, no headaches, no hemoptysis, no neck pain, no rash and no vomiting        Home Medications Prior to Admission medications   Medication Sig Start Date End Date Taking? Authorizing Provider  albuterol (PROVENTIL) (2.5 MG/3ML) 0.083% nebulizer solution Take 2.5 mg by nebulization every 6 (six) hours as needed for wheezing or shortness of breath.    [provider]  albuterol (VENTOLIN HFA) 108 (90 Base) MCG/ACT inhaler Inhale 2 puffs into the lungs every 4 (four) hours as needed for wheezing or shortness of breath. 06/09/20   Medina-Vargas, Monina C, NP  amoxicillin-clavulanate (AUGMENTIN) 875-125 MG tablet Take 1 tablet by mouth every 12 (twelve) hours. 05/30/21   Marita Kansas, PA-C  ascorbic acid (VITAMIN C) 1000 MG tablet Take 1 tablet (1,000 mg total) by mouth daily. 05/26/20   Persons, [place], PA  benzonatate (TESSALON) 100 MG capsule Take 1 capsule (100 mg total) by mouth every 8 (eight) hours. 05/30/21   Marita Kansas, PA-C  doxycycline (VIBRAMYCIN) 100 MG capsule Take 1 capsule (100 mg total) by mouth 2 (two) times daily. 07/25/22   Al Decant, PA-C  Fluticasone-Salmeterol (ADVAIR DISKUS) 250-50 MCG/DOSE AEPB Inhale 1 puff into the lungs 2 (two) times daily. 06/09/20   Medina-Vargas, Monina C, NP  HYDROcodone-acetaminophen  (NORCO/VICODIN) 5-325 MG tablet Take 1 tablet by mouth every 12 (twelve) hours as needed for severe pain. 11/13/20   Nadara Mustard, MD  lisinopril (ZESTRIL) 20 MG tablet Take 1 tablet (20 mg total) by mouth daily with breakfast. 06/09/20   Medina-Vargas, Monina C, NP  nicotine (NICODERM CQ - DOSED IN MG/24 HOURS) 21 mg/24hr patch Place 1 patch (21 mg total) onto the skin daily. 06/09/20   Medina-Vargas, Monina C, NP  OXYGEN Inhale 1-3 L into the lungs See admin instructions. Use overnight and when resting in bed    [provider]  tiZANidine (ZANAFLEX) 4 MG tablet Take 1 tablet (4 mg total) by mouth every morning. 06/09/20   Medina-Vargas, Monina C, NP  traMADol (ULTRAM) 50 MG tablet Take 1 tablet (50 mg total) by mouth 2 (two) times daily. 01/29/21   Adonis Huguenin, NP  Vitamin D, Ergocalciferol, (DRISDOL) 1.25 MG (50000 UNIT) CAPS capsule Take 1 capsule (50,000 Units total) by mouth every 7 (seven) days. 06/09/20   Medina-Vargas, Monina C, NP  zinc sulfate 220 (50 Zn) MG capsule Take 1 capsule (220 mg total) by mouth daily. 06/09/20   Medina-Vargas, Monina C, NP      Allergies    Patient has no known allergies.    Review of Systems   Review of Systems  Constitutional:  Positive for chills and fatigue. Negative for fever.  HENT:  Positive for congestion.   Eyes:  Negative for visual disturbance.  Respiratory:  Positive for cough, sputum production, chest tightness, shortness  of breath and wheezing. Negative for hemoptysis and stridor.   Cardiovascular:  Negative for chest pain, palpitations and leg swelling.  Gastrointestinal:  Negative for abdominal pain, constipation, diarrhea, nausea and vomiting.  Genitourinary:  Negative for dysuria, flank pain and frequency.  Musculoskeletal:  Negative for back pain, neck pain and neck stiffness.  Skin:  Positive for wound (L AKA stump drainage adn pain). Negative for rash.  Neurological:  Negative for headaches.  Psychiatric/Behavioral:   Negative for agitation and confusion.   All other systems reviewed and are negative.   Physical Exam Updated Vital Signs BP (!) 133/100   Pulse 80   Temp 98.3 F (36.8 C) (Oral)   Resp (!) 30   Ht 5\' 6"  (1.676 m)   Wt 57.6 kg   SpO2 99%   BMI 20.50 kg/m  Physical Exam Vitals and nursing note reviewed.  Constitutional:      General: He is not in acute distress.    Appearance: He is well-developed. He is ill-appearing. He is not toxic-appearing or diaphoretic.  HENT:     Head: Normocephalic and atraumatic.  Eyes:     Conjunctiva/sclera: Conjunctivae normal.     Pupils: Pupils are equal, round, and reactive to light.  Cardiovascular:     Rate and Rhythm: Normal rate and regular rhythm.     Heart sounds: No murmur heard. Pulmonary:     Effort: Tachypnea present. No respiratory distress.     Breath sounds: No stridor. Decreased breath sounds and wheezing present. No rhonchi or rales.  Chest:     Chest wall: No tenderness.  Abdominal:     Palpations: Abdomen is soft.     Tenderness: There is no abdominal tenderness.  Musculoskeletal:        General: No swelling.     Cervical back: Neck supple.     Comments: L stump purulence out of a small wound. Some tenderness but no crepitance. No fluctuance.   Skin:    General: Skin is warm and dry.     Capillary Refill: Capillary refill takes less than 2 seconds.     Findings: No erythema.  Neurological:     General: No focal deficit present.     Mental Status: He is alert.  Psychiatric:        Mood and Affect: Mood normal.     ED Results / Procedures / Treatments   Labs (all labs ordered are listed, but only abnormal results are displayed) Labs Reviewed  COMPREHENSIVE METABOLIC PANEL - Abnormal; Notable for the following components:      Result Value   Chloride 97 (*)    Glucose, Bld 105 (*)    Creatinine, Ser 0.53 (*)    All other components within normal limits  CULTURE, BLOOD (ROUTINE X 2)  CULTURE, BLOOD (ROUTINE X  2)  CBC WITH DIFFERENTIAL/PLATELET  LACTIC ACID, PLASMA  LACTIC ACID, PLASMA  TROPONIN I (HIGH SENSITIVITY)    EKG EKG Interpretation  Date/Time:  Tuesday July 26 2022 13:55:31 EDT Ventricular Rate:  85 PR Interval:  163 QRS Duration: 88 QT Interval:  363 QTC Calculation: 432 R Axis:   69 Text Interpretation: Sinus rhythm Ventricular premature complex Consider left atrial enlargement Anterior infarct, old Nonspecific T abnormalities, lateral leads when compared to prior, similar appearance. No STEMI Confirmed by Theda Belfast ([id number]) on 07/26/2022 2:13:00 PM  Radiology DG Chest 2 View  Result Date: 07/25/2022 CLINICAL DATA:  Shortness of breath, chest pain EXAM: CHEST - 2 VIEW COMPARISON:  Previous studies including the examination of 05/30/2021 FINDINGS: Cardiac size is within normal limits. Increase in AP diameter of chest suggests COPD. There are no signs of pulmonary edema or focal pulmonary consolidation. There is no pleural effusion or pneumothorax. Deformity in the right scapula may be residual from previous injury. Degenerative changes are noted in right shoulder. 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 IMPRESSION: COPD. There are no signs of pulmonary edema or focal pulmonary consolidation. Electronically Signed   By: Ernie Avena M.D.   On: 07/25/2022 18:00    Procedures Procedures    CRITICAL CARE Performed by: Canary Brim Isela Stantz Total critical care time: 25 minutes Critical care time was exclusive of separately billable procedures and treating other patients. Critical care was necessary to treat or prevent imminent or life-threatening deterioration. Critical care was time spent personally by me on the following activities: development of treatment plan with patient and/or surrogate as well as nursing, discussions with consultants, evaluation of patient's response to treatment, examination of patient, obtaining history from patient or surrogate,  ordering and performing treatments and interventions, ordering and review of laboratory studies, ordering and review of radiographic studies, pulse oximetry and re-evaluation of patient's condition.   Medications Ordered in ED Medications  magnesium sulfate IVPB 2 g 50 mL (2 g Intravenous New Bag/Given 07/26/22 1416)  ipratropium-albuterol (DUONEB) 0.5-2.5 (3) MG/3ML nebulizer solution 3 mL (3 mLs Nebulization Given 07/26/22 1413)    ED Course/ Medical Decision Making/ A&P Clinical Course as of 07/26/22 1524  Tue Jul 26, 2022  1518 Stable COPD exacerbation Tried therapeutics yesterday in ER but interval worsening and coming back in on a 2LNC (baseline O2 is 2L) [CC]    Clinical Course User Index [CC] Glyn Ade, MD                             Medical Decision Making Amount and/or Complexity of Data Reviewed Labs: ordered. Radiology: ordered.  Risk Prescription drug management.    [name] is a 67 y.o. male with a past medical history significant for hypertension, left AKA, COPD, hypertension, seizures, GERD, and recent visit yesterday to the emergency department for respiratory distress who presents with worsened breathing.  According to patient, he was seen yesterday for breathing difficulties.  He was able to get breathing treatments and get back on his home oxygen and discharged home however he reports after leaving he is having worsened breathing.  He reports he is still wheezing and coughing up yellow phlegm.  He is having chest tightness and shortness of breath but denies chest pain.  Denies palpitations.  Denies nausea, vomiting, constipation, or diarrhea and denies any urinary changes.  He does report he still has purulence coming out of his left stump has been going on for last few weeks.  He has had infection in the left leg before leading to amputations.  He was prescribed doxycycline to treat with antibiotics but had not picked it up and due to his breathing  troubles he came in for reevaluation.  Per EMS report to nursing, they gave him Solu-Medrol and breathing treatment.  He is still not moving much air but his oxygen saturations on 3 L is in the 90s.  On exam, he has diminished breath sounds and wheezing in all fields and I did not appreciate significant rhonchi.  Chest and abdomen nontender.  He does have some tenderness around his left stump where there  is some purulence and erythema.  Patient otherwise resting.  Will get x-ray of the stump to look for evidence of osteomyelitis or subcutaneous gas.  Will get screening blood work.  Will give another breathing treatment and magnesium as he already received Solu-Medrol with EMS.  Due to a second visit in 2 days for respiratory distress, anticipate he may require admission for further management of his COPD exacerbation but will also make sure that is not new osteomyelitis or other skin infection needing more antibiotics.           Final Clinical Impression(s) / ED Diagnoses Final diagnoses:  SOB (shortness of breath)  Wheezing     Clinical Impression: 1. SOB (shortness of breath)   2. Wheezing     Disposition: Care transferred to oncoming team to await reassessment and workup to be completed for recurrent respiratory distress as well as continued wound on left stump.  This note was prepared with assistance of Conservation officer, historic buildings. Occasional wrong-word or sound-a-like substitutions may have occurred due to the inherent limitations of voice recognition software.     Prentice Sackrider, Canary Brim, MD 07/26/22 1524

## 2022-07-26 NOTE — Assessment & Plan Note (Addendum)
-  Continue with home O2, on 2L

## 2022-07-26 NOTE — ED Provider Notes (Signed)
Care of patient received from prior provider at 6:49 PM, please see their note for complete H/P and care plan.  Received handoff per ED course.  Clinical Course as of 07/26/22 1849  Tue Jul 26, 2022  1518 Stable COPD exacerbation Tried therapeutics yesterday in ER but interval worsening and coming back in on a 2LNC (baseline O2 is 2L) [CC]  1849 Has received a total of 3 DuoNebs, Solu-Medrol and magnesium and is slowly improving. [CC]    Clinical Course User Index [CC] Glyn Ade, MD    Reassessment: Consulted hospitalist agreed with need for admission.  Patient admitted with no further acute events.     Glyn Ade, MD 07/26/22 2043

## 2022-07-26 NOTE — ED Notes (Signed)
Attempted to draw cultures and labs. Unsuccessful

## 2022-07-26 NOTE — ED Notes (Signed)
I had no success in getting blood on patient.
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
## 2022-07-26 NOTE — Assessment & Plan Note (Addendum)
-  Continue with lisinopril -Will add prn IV hydralazine and metoprolol

## 2022-07-26 NOTE — Assessment & Plan Note (Addendum)
-  Pt with small granuloma at the end of his AKA. -Wound care has consulted:  Cleanse left AKA site with soap and water and pat dry. Apply Xeroform gauze to open wound. Cover with foam dressing. Change daily.

## 2022-07-26 NOTE — Assessment & Plan Note (Addendum)
Acute on chronic respiratory failure associated with a COPD exacerbation -Patient's shortness of breath and productive cough are most likely caused by acute COPD exacerbation.  -He has been treated with Rocephin/Azithro and has completed treatment -He remains on daily prednisone, will plan for taper -Nebulizers: scheduled Duoneb and prn Duoneb, standing Pulmicort; also on Mucinex and Robitussin -He is significantly improved and is appropriate for dc to home in AM -Needs strict smoking cessation
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
## 2022-07-26 NOTE — Subjective & Objective (Signed)
CC: SOB HPI: 67 year old African-American male with history of COPD, chronic hypoxic respiratory failure on home oxygen at 2 L a minute, hypertension, prior left above-the-knee amputation, chronic tobacco abuse presents to the ER today with a week history of worsening shortness of breath, productive yellow sputum.  He was seen in the ER yesterday and refused admission.  Came back today due to worsening shortness of breath.  On arrival temp 98.3 heart rate 90 blood pressure 140/93 satting 100% on 2 L.  White count 6.5, hemoglobin 13.4, platelets of 224  Sodium 136, potassium 4.0, chloride 97, bicarb 28, BUN of 9, creatinine 0.5, glucose of 105 Lactic acid 1.5   Chest x-ray demonstrates hyperinflation.  No pneumonia.  Left femur x-ray showed demonstrates status post above-the-knee amputation with irregularity at the distal aspect of the stump likely sequelae from prior infection.  No soft tissue gas or opaque foreign body were seen.  Negative for acute osteomyelitis.  Patient did not feel comfortable going home.  [facility] hospitalist contacted for admission.

## 2022-07-26 NOTE — ED Triage Notes (Addendum)
Pt arrived via [facility] from home for c/o increased SOB got worse today. Pt wears 2L 02 per [place] at baseline. Per EMS, when they arrived, pt was not wearing his 02. Per EMS, pt has decreased lungs and wheezes in all lobes. EMS gave a duoneb and solumedrol 125 mg. Pt is A&Ox4.

## 2022-07-27 DIAGNOSIS — J441 Chronic obstructive pulmonary disease with (acute) exacerbation: Secondary | ICD-10-CM | POA: Diagnosis present

## 2022-07-27 DIAGNOSIS — G8929 Other chronic pain: Secondary | ICD-10-CM | POA: Diagnosis present

## 2022-07-27 DIAGNOSIS — R062 Wheezing: Secondary | ICD-10-CM | POA: Diagnosis present

## 2022-07-27 DIAGNOSIS — Z9981 Dependence on supplemental oxygen: Secondary | ICD-10-CM | POA: Diagnosis not present

## 2022-07-27 DIAGNOSIS — E559 Vitamin D deficiency, unspecified: Secondary | ICD-10-CM | POA: Diagnosis present

## 2022-07-27 DIAGNOSIS — L89899 Pressure ulcer of other site, unspecified stage: Secondary | ICD-10-CM | POA: Diagnosis present

## 2022-07-27 DIAGNOSIS — R0602 Shortness of breath: Secondary | ICD-10-CM | POA: Diagnosis present

## 2022-07-27 DIAGNOSIS — I10 Essential (primary) hypertension: Secondary | ICD-10-CM | POA: Diagnosis present

## 2022-07-27 DIAGNOSIS — Z89612 Acquired absence of left leg above knee: Secondary | ICD-10-CM | POA: Diagnosis not present

## 2022-07-27 DIAGNOSIS — Z7951 Long term (current) use of inhaled steroids: Secondary | ICD-10-CM | POA: Diagnosis not present

## 2022-07-27 DIAGNOSIS — L928 Other granulomatous disorders of the skin and subcutaneous tissue: Secondary | ICD-10-CM | POA: Diagnosis present

## 2022-07-27 DIAGNOSIS — Z96652 Presence of left artificial knee joint: Secondary | ICD-10-CM | POA: Diagnosis present

## 2022-07-27 DIAGNOSIS — J9611 Chronic respiratory failure with hypoxia: Secondary | ICD-10-CM | POA: Diagnosis present

## 2022-07-27 DIAGNOSIS — E871 Hypo-osmolality and hyponatremia: Secondary | ICD-10-CM | POA: Diagnosis present

## 2022-07-27 DIAGNOSIS — Z87891 Personal history of nicotine dependence: Secondary | ICD-10-CM | POA: Diagnosis not present

## 2022-07-27 DIAGNOSIS — Z1152 Encounter for screening for COVID-19: Secondary | ICD-10-CM | POA: Diagnosis not present

## 2022-07-27 DIAGNOSIS — T8789 Other complications of amputation stump: Secondary | ICD-10-CM | POA: Diagnosis present

## 2022-07-27 DIAGNOSIS — Z79899 Other long term (current) drug therapy: Secondary | ICD-10-CM | POA: Diagnosis not present

## 2022-07-27 DIAGNOSIS — Z87820 Personal history of traumatic brain injury: Secondary | ICD-10-CM | POA: Diagnosis not present

## 2022-07-27 DIAGNOSIS — S78112A Complete traumatic amputation at level between left hip and knee, initial encounter: Secondary | ICD-10-CM | POA: Diagnosis not present

## 2022-07-27 DIAGNOSIS — K219 Gastro-esophageal reflux disease without esophagitis: Secondary | ICD-10-CM | POA: Diagnosis present

## 2022-07-27 LAB — HIV ANTIBODY (ROUTINE TESTING W REFLEX): HIV Screen 4th Generation wRfx: NONREACTIVE

## 2022-07-27 LAB — RESPIRATORY PANEL BY PCR

## 2022-07-27 LAB — SARS CORONAVIRUS 2 BY RT PCR: SARS Coronavirus 2 by RT PCR: NEGATIVE

## 2022-07-27 LAB — BRAIN NATRIURETIC PEPTIDE: B Natriuretic Peptide: 24.4 pg/mL (ref 0.0–100.0)

## 2022-07-27 LAB — MRSA NEXT GEN BY PCR, NASAL

## 2022-07-27 LAB — PROCALCITONIN: Procalcitonin: <0.1 ng/mL

## 2022-07-27 MED ORDER — BUDESONIDE 0.5 MG/2ML IN SUSP
0.5000 mg | Freq: Two times a day (BID) | RESPIRATORY_TRACT | Status: DC
  Administered 2022-07-27 – 2022-08-02 (×11): 0.5 mg via RESPIRATORY_TRACT
  Filled 2022-07-27 (×12): qty 2

## 2022-07-27 MED ORDER — SENNOSIDES-DOCUSATE SODIUM 8.6-50 MG PO TABS: 1.0000 | ORAL_TABLET | Freq: Every evening | ORAL | Status: DC | PRN

## 2022-07-27 MED ORDER — GUAIFENESIN 100 MG/5ML PO LIQD
5.0000 mL | ORAL | Status: DC | PRN
  Administered 2022-07-27 – 2022-07-28 (×2): 5 mL via ORAL
  Filled 2022-07-27 (×2): qty 15

## 2022-07-27 MED ORDER — METOPROLOL TARTRATE 5 MG/5ML IV SOLN: 5.0000 mg | INTRAVENOUS | Status: DC | PRN

## 2022-07-27 MED ORDER — TRAZODONE HCL 50 MG PO TABS
50.0000 mg | ORAL_TABLET | Freq: Every evening | ORAL | Status: DC | PRN
  Administered 2022-07-27 – 2022-07-29 (×3): 50 mg via ORAL
  Filled 2022-07-27 (×4): qty 1

## 2022-07-27 MED ORDER — AZITHROMYCIN 500 MG PO TABS
250.0000 mg | ORAL_TABLET | Freq: Every day | ORAL | Status: AC
  Administered 2022-07-27 – 2022-07-31 (×5): 250 mg via ORAL
  Filled 2022-07-27 (×5): qty 1

## 2022-07-27 MED ORDER — BUDESONIDE 0.5 MG/2ML IN SUSP
RESPIRATORY_TRACT | Status: AC
  Administered 2022-07-27: 0.5 mg
  Filled 2022-07-27: qty 2

## 2022-07-27 MED ORDER — IPRATROPIUM-ALBUTEROL 0.5-2.5 (3) MG/3ML IN SOLN: 3.0000 mL | RESPIRATORY_TRACT | Status: DC | PRN

## 2022-07-27 MED ORDER — HYDRALAZINE HCL 20 MG/ML IJ SOLN: 10.0000 mg | INTRAMUSCULAR | Status: DC | PRN

## 2022-07-27 MED ORDER — METHYLPREDNISOLONE SODIUM SUCC 40 MG IJ SOLR
40.0000 mg | Freq: Two times a day (BID) | INTRAMUSCULAR | Status: DC
  Administered 2022-07-27 – 2022-07-30 (×7): 40 mg via INTRAVENOUS
  Filled 2022-07-27 (×7): qty 1

## 2022-07-27 NOTE — TOC CM/SW Note (Signed)
Transition of Care [facility]) - Inpatient Brief Assessment   Patient Details  Name: [name] MRN: [id number] Date of Birth: [birth year]  Transition of Care [facility]) CM/SW Contact:    Tom-Johnson, Hershal Coria, RN Phone Number: 07/27/2022, 1:03 PM   Clinical Narrative:  Patient is admitted with COPD Exacerbation. On oral Steroids, IV abx transitioned to oral abx and Nebulizer tx.On 2L O2 chronic. Uses home O2. WOC following for wound to Lt AKA incision line at stump and blackened Rt foot toes.   From home with daughter [name], has four children. On disability. Does not drive, uses [employer] and sometimes uses [employer] transportation.  States he is independent with ADL's and daughter, [name] cooks his meals. Has a walker, wheelchair and shower seat at home. PCP is Fleet Contras, MD and uses [facility] on [facility].   No TOC needs or recommendations noted at this time. CM will continue to follow as patient progresses with care towards discharge.       Transition of Care Asessment: Insurance and Status: Insurance coverage has been reviewed Patient has primary care physician: Yes Home environment has been reviewed: Yes Prior level of function:: Independent Prior/Current Home Services: No current home services Social Determinants of Health Reivew: SDOH reviewed no interventions necessary Readmission risk has been reviewed: Yes Transition of care needs: transition of care needs identified, TOC will continue to follow

## 2022-07-27 NOTE — Care Management Obs Status (Signed)
MEDICARE OBSERVATION STATUS NOTIFICATION   Patient Details  Name: [name] MRN: [id number] Date of Birth: 1955-06-21   Medicare Observation Status Notification Given:  Yes    Tom-Johnson, Hershal Coria, RN 07/27/2022, 2:07 PM

## 2022-07-27 NOTE — Progress Notes (Signed)
PROGRESS NOTE    [name]  ZOX:[id number] DOB: [birth year] DOA: 07/26/2022 PCP: Fleet Contras, MD   Brief Narrative:  67 year old African-American male with history of COPD, chronic hypoxia on 2 L nasal cannula, HTN, left-sided AKA, chronic tobacco use comes to the ED with increasing shortness of breath and sputum production.  Came to the ER day prior to admission but declined admission during that visit but comes back again.  Patient was diagnosed with COPD exacerbation and admitted to the hospital.   Assessment & Plan:  Principal Problem:   COPD exacerbation (HCC) Active Problems:   Essential hypertension   Above knee amputation of left lower extremity (HCC)   Chronic respiratory failure with hypoxia, on home O2 therapy (HCC) - 2 L/min     Assessment and Plan: * COPD exacerbation (HCC) Change to solumedrol, nebulizer scheduled and as needed, I-S/flutter valve.  Pro-Cal negative, discontinue Rocephin.  Continue azithromycin total 5 days.  Will check respiratory panel/covid   Chronic respiratory failure with hypoxia, on home O2 therapy (HCC) - 2 L/min Continue with home O2.  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  Essential hypertension On home lisinopril.  IV as needed    DVT prophylaxis: Subcu heparin Code Status: Full code Family Communication:   Continue hospital stay, significant abnormal breath sounds      Diet Orders (From admission, onward)     Start     Ordered   07/26/22 2214  Diet Heart Room service appropriate? Yes; Fluid consistency: Thin  Diet effective now       Question Answer Comment  Room service appropriate? Yes   Fluid consistency: Thin      07/26/22 2213            Subjective:  Seen at bedside, no complaints besides exertional dyspnea  Examination:  General exam: Appears calm and comfortable  Respiratory system: Bilateral  diffuse diminished breath sounds Cardiovascular system: S1 & S2 heard, RRR. No JVD, murmurs, rubs, gallops or clicks. No pedal edema. Gastrointestinal system: Abdomen is nondistended, soft and nontender. No organomegaly or masses felt. Normal bowel sounds heard. Central nervous system: Alert and oriented. No focal neurological deficits. Extremities: Symmetric 5 x 5 power. Skin: No rashes, lesions or ulcers Psychiatry: Judgement and insight appear normal. Mood & affect appropriate.  Objective: Vitals:   07/26/22 2150 07/26/22 2219 07/27/22 0259 07/27/22 0546  BP: (!) 143/92 (!) 171/100  (!) (P) 139/102  Pulse: 63 68  (P) 65  Resp: 20 20    Temp: 97.9 F (36.6 C) 98.4 F (36.9 C)  (P) 98 F (36.7 C)  TempSrc: Oral   (P) Oral  SpO2: 100% 100% 100% (P) 100%  Weight:      Height:        Intake/Output Summary (Last 24 hours) at 07/27/2022 0813 Last data filed at 07/27/2022 0748 Gross per 24 hour  Intake 700.14 ml  Output --  Net 700.14 ml   Filed Weights   07/26/22 1405  Weight: 57.6 kg    Scheduled Meds:  heparin  5,000 Units Subcutaneous Q8H   ipratropium-albuterol  3 mL Nebulization Q6H   lisinopril  20 mg Oral Q breakfast   predniSONE  40 mg Oral Q breakfast   Continuous Infusions:  cefTRIAXone (ROCEPHIN)  IV 1 g (07/26/22 2314)    Nutritional status     Body mass index is 20.5 kg/m.  Data Reviewed:   CBC: Recent Labs  Lab 07/25/22 1623 07/26/22 1404  WBC 7.0 6.5  NEUTROABS  --  4.4  HGB 12.6* 13.4  HCT 41.9 44.5  MCV 96.8 95.5  PLT 217 224   Basic Metabolic Panel: Recent Labs  Lab 07/25/22 1623 07/26/22 1404  NA 138 136  K 4.0 4.0  CL 97* 97*  CO2 31 28  GLUCOSE 129* 105*  BUN 8 9  CREATININE 0.47* 0.53*  CALCIUM 9.4 9.7   GFR: Estimated Creatinine Clearance: 73 mL/min (A) (by C-G formula based on SCr of 0.53 mg/dL (L)). Liver Function Tests: Recent Labs  Lab 07/26/22 1404  AST 22  ALT 10  ALKPHOS 91  BILITOT 0.5  PROT 8.0   ALBUMIN 4.3   No results for input(s): "LIPASE", "AMYLASE" in the last 168 hours. No results for input(s): "AMMONIA" in the last 168 hours. Coagulation Profile: No results for input(s): "INR", "PROTIME" in the last 168 hours. Cardiac Enzymes: No results for input(s): "CKTOTAL", "CKMB", "CKMBINDEX", "TROPONINI" in the last 168 hours. BNP (last 3 results) No results for input(s): "PROBNP" in the last 8760 hours. HbA1C: No results for input(s): "HGBA1C" in the last 72 hours. CBG: No results for input(s): "GLUCAP" in the last 168 hours. Lipid Profile: No results for input(s): "CHOL", "HDL", "LDLCALC", "TRIG", "CHOLHDL", "LDLDIRECT" in the last 72 hours. Thyroid Function Tests: No results for input(s): "TSH", "T4TOTAL", "FREET4", "T3FREE", "THYROIDAB" in the last 72 hours. Anemia Panel: No results for input(s): "VITAMINB12", "FOLATE", "FERRITIN", "TIBC", "IRON", "RETICCTPCT" in the last 72 hours. Sepsis Labs: Recent Labs  Lab 07/26/22 1411  LATICACIDVEN 1.5    Recent Results (from the past 240 hour(s))  MRSA Next Gen by PCR, Nasal     Status: Abnormal   Collection Time: 07/27/22 12:39 AM   Specimen: Nasal Mucosa; Nasal Swab  Result Value Ref Range Status   MRSA by PCR Next Gen (A) NOT DETECTED Final    INVALID, UNABLE TO DETERMINE THE PRESENCE OF TARGET DUE TO SPECIMEN INTEGRITY. RECOLLECTION REQUESTED.    Comment: RESULT CALLED TO, READ BACK BY AND VERIFIED WITH: CHERYL,RN@0211  07/27/22 MK Performed at [facility], [address], [place], [place] [address]          Radiology Studies: DG Femur Portable Min 2 Views Left  Result Date: 07/26/2022 CLINICAL DATA:  Purulence from left above the knee amputation stump. EXAM: LEFT FEMUR PORTABLE 2 VIEWS COMPARISON:  07/03/2020 FINDINGS: Post above the knee amputation. Irregularity at the distal aspect of the stump likely sequela of prior infection and surgery. No frank bony destructive or erosive changes. No fracture. There  is no soft tissue gas or radiopaque foreign body. Arterial vascular calcifications are seen. IMPRESSION: Post above the knee amputation with irregularity at the distal aspect of the stump likely sequela of prior infection and surgery. No radiographic findings of acute osteomyelitis. No soft tissue gas or radiopaque foreign body. Electronically Signed   By: Narda Rutherford M.D.   On: 07/26/2022 16:10   DG Chest Portable 1 View  Result Date: 07/26/2022 CLINICAL DATA:  Respiratory distress EXAM: PORTABLE CHEST 1 VIEW COMPARISON:  X-ray 07/25/2022 FINDINGS: Hyperinflation. No consolidation, pneumothorax or effusion. No edema. Normal cardiopericardial silhouette. Degenerative changes along the spine and shoulders. 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 IMPRESSION: Hyperinflation.  No acute cardiopulmonary disease. Electronically Signed   By: Karen Kays M.D.   On: 07/26/2022 16:07   DG  Chest 2 View  Result Date: 07/25/2022 CLINICAL DATA:  Shortness of breath, chest pain EXAM: CHEST - 2 VIEW COMPARISON:  Previous studies including the examination of 05/30/2021 FINDINGS: Cardiac size is within normal limits. Increase in AP diameter of chest suggests COPD. There are no signs of pulmonary edema or focal pulmonary consolidation. There is no pleural effusion or pneumothorax. Deformity in the right scapula may be residual from previous injury. Degenerative changes are noted in right shoulder. 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 IMPRESSION: COPD. There are no signs of pulmonary edema or focal pulmonary consolidation. Electronically Signed   By: Ernie Avena M.D.   On: 07/25/2022 18:00           LOS: 0 days   Time spent= 35 mins    Curtez Brallier Joline Maxcy, MD [facility]  If 7PM-7AM, please contact night-coverage  07/27/2022, 8:13 AM

## 2022-07-27 NOTE — Consult Note (Signed)
WOC Nurse Consult Note: Reason for Consult:Left AKA incision line, previously healed has opened.  Right foot with dark intact areas to toes.  Hammer toe to second toe with intact maroon discoloration.   Wound type: trauma to left AKA site.  States he does have a prosthesis that sometimes rubs in this area.  Pressure to right toes Pressure Injury POA: Yes Measurement: 0.2 cm darkened areas to right great toe, second and third toes, intact.   Left AKA incision line 0.3 cm round open lesion with hypergranulated wound bed.  Wound bed:red moist and hypergranulation to left AKA incision site.  Drainage (amount, consistency, odor) scant serosanguinous to left AKA Periwound: intact Dressing procedure/placement/frequency: Cleanse left AKA site with soap and water and pat dry.  Apply Xeroform gauze to open wound.  Cover with foam dressing. Change daily.  Will not follow at this time.  Please re-consult if needed.  Mike Gip MSN, RN, FNP-BC CWON Wound, Ostomy, Continence Nurse Outpatient [facility] [phone] Pager [phone]

## 2022-07-28 DIAGNOSIS — J441 Chronic obstructive pulmonary disease with (acute) exacerbation: Secondary | ICD-10-CM | POA: Diagnosis not present

## 2022-07-28 LAB — BASIC METABOLIC PANEL
Anion gap: 8 (ref 5–15)
BUN: 7 mg/dL — ABNORMAL LOW (ref 8–23)
CO2: 30 mmol/L (ref 22–32)
Calcium: 9 mg/dL (ref 8.9–10.3)
Chloride: 98 mmol/L (ref 98–111)
Creatinine, Ser: 0.46 mg/dL — ABNORMAL LOW (ref 0.61–1.24)
GFR, Estimated: >60 mL/min (ref >60)
Glucose, Bld: 148 mg/dL — ABNORMAL HIGH (ref 70–99)
Potassium: 4 mmol/L (ref 3.5–5.1)
Sodium: 136 mmol/L (ref 135–145)

## 2022-07-28 LAB — MAGNESIUM: Magnesium: 2.2 mg/dL (ref 1.7–2.4)

## 2022-07-28 LAB — CULTURE, BLOOD (ROUTINE X 2)
Culture: NO GROWTH
Culture: NO GROWTH

## 2022-07-28 LAB — CBC
HCT: 34.3 % — ABNORMAL LOW (ref 39.0–52.0)
Hemoglobin: 10.5 g/dL — ABNORMAL LOW (ref 13.0–17.0)
MCH: 28.8 pg (ref 26.0–34.0)
MCHC: 30.6 g/dL (ref 30.0–36.0)
MCV: 94 fL (ref 80.0–100.0)
Platelets: 200 10*3/uL (ref 150–400)
RBC: 3.65 MIL/uL — ABNORMAL LOW (ref 4.22–5.81)
RDW: 14.9 % (ref 11.5–15.5)
WBC: 6.8 10*3/uL (ref 4.0–10.5)
nRBC: 0 % (ref 0.0–0.2)

## 2022-07-28 LAB — MRSA CULTURE

## 2022-07-28 MED ORDER — GUAIFENESIN ER 600 MG PO TB12
600.0000 mg | ORAL_TABLET | Freq: Two times a day (BID) | ORAL | Status: DC
  Administered 2022-07-28 – 2022-08-02 (×11): 600 mg via ORAL
  Filled 2022-07-28 (×11): qty 1

## 2022-07-28 NOTE — Plan of Care (Signed)
  Problem: Education: Goal: Knowledge of the prescribed therapeutic regimen will improve Outcome: Progressing Goal: Individualized Educational Video(s) Outcome: Progressing   Problem: Activity: Goal: Ability to tolerate increased activity will improve Outcome: Progressing Goal: Will verbalize the importance of balancing activity with adequate rest periods Outcome: Progressing   Problem: Respiratory: Goal: Ability to maintain a clear airway will improve Outcome: Progressing Goal: Levels of oxygenation will improve Outcome: Progressing Goal: Ability to maintain adequate ventilation will improve Outcome: Progressing   Problem: Education: Goal: Knowledge of General Education information will improve Description: Including pain rating scale, medication(s)/side effects and non-pharmacologic comfort measures Outcome: Progressing   Problem: Health Behavior/Discharge Planning: Goal: Ability to manage health-related needs will improve Outcome: Progressing   Problem: Clinical Measurements: Goal: Ability to maintain clinical measurements within normal limits will improve Outcome: Progressing Goal: Will remain free from infection Outcome: Progressing Goal: Diagnostic test results will improve Outcome: Progressing Goal: Respiratory complications will improve Outcome: Progressing Goal: Cardiovascular complication will be avoided Outcome: Progressing   Problem: Activity: Goal: Risk for activity intolerance will decrease Outcome: Progressing   Problem: Nutrition: Goal: Adequate nutrition will be maintained Outcome: Progressing   Problem: Coping: Goal: Level of anxiety will decrease Outcome: Progressing   Problem: Elimination: Goal: Will not experience complications related to bowel motility Outcome: Progressing Goal: Will not experience complications related to urinary retention Outcome: Progressing   Problem: Pain Managment: Goal: General experience of comfort will  improve Outcome: Progressing   Problem: Safety: Goal: Ability to remain free from injury will improve Outcome: Progressing   Problem: Skin Integrity: Goal: Risk for impaired skin integrity will decrease Outcome: Progressing
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
## 2022-07-28 NOTE — Progress Notes (Signed)
PROGRESS NOTE    [name]  ZOX:[id number] DOB: 1956/02/06 DOA: 07/26/2022 PCP: Fleet Contras, MD   Brief Narrative:  67 year old African-American male with history of COPD, chronic hypoxia on 2 L nasal cannula, HTN, left-sided AKA, chronic tobacco use comes to the ED with increasing shortness of breath and sputum production.  Came to the ER day prior to admission but declined admission during that visit but comes back again.  Patient was diagnosed with COPD exacerbation and admitted to the hospital.  Patient was started on steroids and bronchodilators.  Respiratory panel and COVID-19 test was negative   Assessment & Plan:  Principal Problem:   COPD exacerbation (HCC) Active Problems:   Essential hypertension   Above knee amputation of left lower extremity (HCC)   Chronic respiratory failure with hypoxia, on home O2 therapy (HCC) - 2 L/min     Assessment and Plan: * COPD exacerbation (HCC) Still having significant bilateral diminished breath sounds change to solumedrol, nebulizer scheduled and as needed, I-S/flutter valve.  Pro-Cal negative, discontinue Rocephin.  Continue azithromycin total 5 days.  Respiratory panel and COVID-19 test negative   Chronic respiratory failure with hypoxia, on home O2 therapy (HCC) - 2 L/min Continue with home O2.  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  Essential hypertension On home lisinopril.  IV as needed  Needs to get out of bed to chair PT/OT  DVT prophylaxis: Subcu heparin Code Status: Full code Family Communication:   Continue hospital stay, significant abnormal breath sounds      Diet Orders (From admission, onward)     Start     Ordered   07/26/22 2214  Diet Heart Room service appropriate? Yes; Fluid consistency: Thin  Diet effective now       Question Answer Comment  Room service appropriate? Yes   Fluid consistency:  Thin      07/26/22 2213            Subjective:  Still having quite a bit of coughing and respiratory dyspnea with limited mobility  Examination:  Constitutional: Not in acute distress Respiratory: Diffuse diminished breath sounds bilaterally Cardiovascular: Normal sinus rhythm, no rubs Abdomen: Nontender nondistended good bowel sounds Musculoskeletal: No edema noted Skin: No rashes seen Neurologic: CN 2-12 grossly intact.  And nonfocal Psychiatric: Normal judgment and insight. Alert and oriented x 3. Normal mood.  Objective: Vitals:   07/28/22 0247 07/28/22 0447 07/28/22 0747 07/28/22 0818  BP:  126/61 (!) 148/80 129/75  Pulse: (!) 47 (!) 47 64 (!) 52  Resp: 15 18 18 16   Temp:  97.6 F (36.4 C) (!) 97.2 F (36.2 C)   TempSrc:   Oral   SpO2: 98% 97% 97% 100%  Weight:      Height:        Intake/Output Summary (Last 24 hours) at 07/28/2022 1215 Last data filed at 07/28/2022 0900 Gross per 24 hour  Intake 1140 ml  Output 1250 ml  Net -110 ml   Filed Weights   07/26/22 1405  Weight: 57.6 kg    Scheduled Meds:  azithromycin  250 mg Oral Daily   budesonide (PULMICORT) nebulizer solution  0.5 mg Nebulization BID   guaiFENesin  600 mg Oral BID   heparin  5,000 Units Subcutaneous Q8H   ipratropium-albuterol  3 mL Nebulization Q6H   lisinopril  20 mg Oral Q breakfast   methylPREDNISolone (SOLU-MEDROL) injection  40 mg Intravenous Q12H   Continuous Infusions:    Nutritional status     Body mass index is 20.5 kg/m.  Data Reviewed:   CBC: Recent Labs  Lab 07/25/22 1623 07/26/22 1404 07/28/22 0928  WBC 7.0 6.5 6.8  NEUTROABS  --  4.4  --   HGB 12.6* 13.4 10.5*  HCT 41.9 44.5 34.3*  MCV 96.8 95.5 94.0  PLT 217 224 200   Basic Metabolic Panel: Recent Labs  Lab 07/25/22 1623 07/26/22 1404 07/28/22 0928  NA 138 136 136  K 4.0 4.0 4.0  CL 97* 97* 98  CO2 31 28 30   GLUCOSE 129* 105* 148*  BUN 8 9 7*  CREATININE 0.47* 0.53* 0.46*  CALCIUM 9.4  9.7 9.0  MG  --   --  2.2   GFR: Estimated Creatinine Clearance: 73 mL/min (A) (by C-G formula based on SCr of 0.46 mg/dL (L)). Liver Function Tests: Recent Labs  Lab 07/26/22 1404  AST 22  ALT 10  ALKPHOS 91  BILITOT 0.5  PROT 8.0  ALBUMIN 4.3   No results for input(s): "LIPASE", "AMYLASE" in the last 168 hours. No results for input(s): "AMMONIA" in the last 168 hours. Coagulation Profile: No results for input(s): "INR", "PROTIME" in the last 168 hours. Cardiac Enzymes: No results for input(s): "CKTOTAL", "CKMB", "CKMBINDEX", "TROPONINI" in the last 168 hours. BNP (last 3 results) No results for input(s): "PROBNP" in the last 8760 hours. HbA1C: No results for input(s): "HGBA1C" in the last 72 hours. CBG: No results for input(s): "GLUCAP" in the last 168 hours. Lipid Profile: No results for input(s): "CHOL", "HDL", "LDLCALC", "TRIG", "CHOLHDL", "LDLDIRECT" in the last 72 hours. Thyroid Function Tests: No results for input(s): "TSH", "T4TOTAL", "FREET4", "T3FREE", "THYROIDAB" in the last 72 hours. Anemia Panel: No results for input(s): "VITAMINB12", "FOLATE", "FERRITIN", "TIBC", "IRON", "RETICCTPCT" in the last 72 hours. Sepsis Labs: Recent Labs  Lab 07/26/22 1411 07/26/22 1718  PROCALCITON  --  <0.10  LATICACIDVEN 1.5  --     Recent Results (from the past 240 hour(s))  Blood culture (routine x 2)     Status: None (Preliminary result)   Collection Time: 07/26/22  2:11 PM   Specimen: BLOOD RIGHT FOREARM  Result Value Ref Range Status   Specimen Description BLOOD RIGHT FOREARM  Final   Special Requests   Final    BOTTLES DRAWN AEROBIC AND ANAEROBIC Blood Culture adequate volume   Culture   Final    NO GROWTH 2 DAYS Performed at [facility], [address], [place], [place] [address]    Report Status PENDING  Incomplete  Blood culture (routine x 2)     Status: None (Preliminary result)   Collection Time: 07/26/22  5:18 PM   Specimen: BLOOD  Result Value  Ref Range Status   Specimen Description BLOOD LEFT ANTECUBITAL  Final   Special Requests   Final    BOTTLES DRAWN AEROBIC AND ANAEROBIC Blood Culture results may not be optimal due to an inadequate volume of blood received in culture bottles   Culture   Final    NO GROWTH 2 DAYS Performed at [facility], [address], [place], [place] [address]    Report Status PENDING  Incomplete  MRSA Next Gen by PCR, Nasal     Status: Abnormal   Collection Time: 07/27/22 12:39 AM   Specimen: Nasal Mucosa; Nasal Swab  Result Value Ref Range Status   MRSA by PCR Next Gen (A)  NOT DETECTED Final    INVALID, UNABLE TO DETERMINE THE PRESENCE OF TARGET DUE TO SPECIMEN INTEGRITY. RECOLLECTION REQUESTED.    Comment: RESULT CALLED TO, READ BACK BY AND VERIFIED WITH: CHERYL,RN@[id number]  07/27/22 MK Performed at [facility], [address] [place], [place] [address]   Respiratory (~20 pathogens) panel by PCR     Status: None   Collection Time: 07/27/22  8:19 AM   Specimen: Nasopharyngeal Swab; Respiratory  Result Value Ref Range Status   Adenovirus NOT DETECTED NOT DETECTED Final   Coronavirus 229E NOT DETECTED NOT DETECTED Final    Comment: (NOTE) The Coronavirus on the Respiratory Panel, DOES NOT test for the novel  Coronavirus (2019 nCoV)    Coronavirus HKU1 NOT DETECTED NOT DETECTED Final   Coronavirus NL63 NOT DETECTED NOT DETECTED Final   Coronavirus OC43 NOT DETECTED NOT DETECTED Final   Metapneumovirus NOT DETECTED NOT DETECTED Final   Rhinovirus / Enterovirus NOT DETECTED NOT DETECTED Final   Influenza A NOT DETECTED NOT DETECTED Final   Influenza B NOT DETECTED NOT DETECTED Final   Parainfluenza Virus 1 NOT DETECTED NOT DETECTED Final   Parainfluenza Virus 2 NOT DETECTED NOT DETECTED Final   Parainfluenza Virus 3 NOT DETECTED NOT DETECTED Final   Parainfluenza Virus 4 NOT DETECTED NOT DETECTED Final   Respiratory Syncytial Virus NOT DETECTED NOT DETECTED Final   Bordetella  pertussis NOT DETECTED NOT DETECTED Final   Bordetella Parapertussis NOT DETECTED NOT DETECTED Final   Chlamydophila pneumoniae NOT DETECTED NOT DETECTED Final   Mycoplasma pneumoniae NOT DETECTED NOT DETECTED Final    Comment: Performed at [facility], [address], [place], [place] [address]  SARS Coronavirus 2 by RT PCR (hospital order, performed in [facility] hospital lab) *cepheid single result test* Anterior Nasal Swab     Status: None   Collection Time: 07/27/22 10:09 AM   Specimen: Anterior Nasal Swab  Result Value Ref Range Status   SARS Coronavirus 2 by RT PCR NEGATIVE NEGATIVE Final    Comment: Performed at [facility], [address] [place], [place] [address]         Radiology Studies: DG Femur Portable Min 2 Views Left  Result Date: 07/26/2022 CLINICAL DATA:  Purulence from left above the knee amputation stump. EXAM: LEFT FEMUR PORTABLE 2 VIEWS COMPARISON:  07/03/2020 FINDINGS: Post above the knee amputation. Irregularity at the distal aspect of the stump likely sequela of prior infection and surgery. No frank bony destructive or erosive changes. No fracture. There is no soft tissue gas or radiopaque foreign body. Arterial vascular calcifications are seen. IMPRESSION: Post above the knee amputation with irregularity at the distal aspect of the stump likely sequela of prior infection and surgery. No radiographic findings of acute osteomyelitis. No soft tissue gas or radiopaque foreign body. Electronically Signed   By: Narda Rutherford M.D.   On: 07/26/2022 16:10   DG Chest Portable 1 View  Result Date: 07/26/2022 CLINICAL DATA:  Respiratory distress EXAM: PORTABLE CHEST 1 VIEW COMPARISON:  X-ray 07/25/2022 FINDINGS: Hyperinflation. No consolidation, pneumothorax or effusion. No edema. Normal cardiopericardial silhouette. Degenerative changes along the spine and shoulders. 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  IMPRESSION: Hyperinflation.  No acute cardiopulmonary disease. Electronically Signed   By: Karen Kays M.D.   On: 07/26/2022 16:07           LOS: 1 day   Time spent= 35 mins  Quadarius Henton Joline Maxcy, MD [facility]  If 7PM-7AM, please contact night-coverage  07/28/2022, 12:15 PM

## 2022-07-29 DIAGNOSIS — J441 Chronic obstructive pulmonary disease with (acute) exacerbation: Secondary | ICD-10-CM | POA: Diagnosis not present

## 2022-07-29 LAB — BASIC METABOLIC PANEL
Anion gap: 8 (ref 5–15)
BUN: 8 mg/dL (ref 8–23)
CO2: 31 mmol/L (ref 22–32)
Calcium: 8.7 mg/dL — ABNORMAL LOW (ref 8.9–10.3)
Chloride: 95 mmol/L — ABNORMAL LOW (ref 98–111)
Creatinine, Ser: 0.63 mg/dL (ref 0.61–1.24)
GFR, Estimated: >60 mL/min (ref >60)
Glucose, Bld: 172 mg/dL — ABNORMAL HIGH (ref 70–99)
Potassium: 3.9 mmol/L (ref 3.5–5.1)
Sodium: 134 mmol/L — ABNORMAL LOW (ref 135–145)

## 2022-07-29 LAB — MRSA CULTURE: Culture: NOT DETECTED

## 2022-07-29 LAB — CBC
HCT: 30.7 % — ABNORMAL LOW (ref 39.0–52.0)
Hemoglobin: 9.5 g/dL — ABNORMAL LOW (ref 13.0–17.0)
MCH: 29 pg (ref 26.0–34.0)
MCHC: 30.9 g/dL (ref 30.0–36.0)
MCV: 93.6 fL (ref 80.0–100.0)
Platelets: 169 10*3/uL (ref 150–400)
RBC: 3.28 MIL/uL — ABNORMAL LOW (ref 4.22–5.81)
RDW: 15.1 % (ref 11.5–15.5)
WBC: 5.8 10*3/uL (ref 4.0–10.5)
nRBC: 0 % (ref 0.0–0.2)

## 2022-07-29 LAB — GLUCOSE, CAPILLARY: Glucose-Capillary: 228 mg/dL — ABNORMAL HIGH (ref 70–99)

## 2022-07-29 LAB — MAGNESIUM: Magnesium: 2.1 mg/dL (ref 1.7–2.4)
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
## 2022-07-29 NOTE — Progress Notes (Signed)
Mobility Specialist Progress Note   07/29/22 1039  Mobility  Activity Transferred from chair to bed  Level of Assistance Moderate assist, patient does 50-74%  Assistive Device Other (Comment) (HHA)  Distance Ambulated (ft) 2 ft  Activity Response Tolerated well  Mobility Referral Yes  $Mobility charge 1 Mobility  Mobility Specialist Start Time (ACUTE ONLY) 1030  Mobility Specialist Stop Time (ACUTE ONLY) 1038  Mobility Specialist Time Calculation (min) (ACUTE ONLY) 8 min   Pt requesting assistance to get from chair to bed d/t tiredness. Pt deferring RW to stand-pivot and requesting to SPT w/ HHA. ModA to stand and pivot, once EOB, pt able to maneuver self in bed w/ minG. Pt left supine in bed with all needs met, call bell in reach and bed alarm on.  Frederico Hamman Mobility Specialist Please contact via SecureChat or  Rehab office at [phone]

## 2022-07-29 NOTE — Care Management Important Message (Signed)
Important Message  Patient Details  Name: [name] MRN: [id number] Date of Birth: 1955/03/25   Medicare Important Message Given:  Yes     Dorena Bodo 07/29/2022, 3:15 PM

## 2022-07-29 NOTE — Progress Notes (Signed)
PROGRESS NOTE    [name]  ZOX:[id number] DOB: 1955-05-23 DOA: 07/26/2022 PCP: Fleet Contras, MD   Brief Narrative:  67 year old African-American male with history of COPD, chronic hypoxia on 2 L nasal cannula, HTN, left-sided AKA, chronic tobacco use comes to the ED with increasing shortness of breath and sputum production.  Came to the ER day prior to admission but declined admission during that visit but comes back again.  Patient was diagnosed with COPD exacerbation and admitted to the hospital.  Patient was started on steroids and bronchodilators.  Respiratory panel and COVID-19 test was negative.  PT OT recommended home health.   Assessment & Plan:  Principal Problem:   COPD exacerbation (HCC) Active Problems:   Essential hypertension   Above knee amputation of left lower extremity (HCC)   Chronic respiratory failure with hypoxia, on home O2 therapy (HCC) - 2 L/min     Assessment and Plan: * COPD exacerbation (HCC) Still has bilateral quite diminished breath sounds continue IV Solu-Medrol, nebulizer scheduled and as needed, I-S/flutter valve.  Pro-Cal negative, Rocephin discontinued.  Continue azithromycin total 5 days.  Respiratory panel and COVID-19 test negative   Chronic respiratory failure with hypoxia, on home O2 therapy (HCC) - 2 L/min Continue with home O2.  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  Essential hypertension On home lisinopril.  IV as needed  Needs to get out of bed to chair PT/OT-recommended home health.  Face-to-face completed  DVT prophylaxis: Subcu heparin Code Status: Full code Family Communication:   Continue hospital stay, significant abnormal breath sounds Anticipate 48 hours of hospital stay     Diet Orders (From admission, onward)     Start     Ordered   07/26/22 2214  Diet Heart Room service appropriate? Yes; Fluid  consistency: Thin  Diet effective now       Question Answer Comment  Room service appropriate? Yes   Fluid consistency: Thin      07/26/22 2213            Subjective: Seen and examined bedside, still getting exertional dyspnea.  Does tell me that at rest his breathing is slightly improved  Examination: Constitutional: Not in acute distress Respiratory: Bilateral diminished breath sounds Cardiovascular: Normal sinus rhythm, no rubs Abdomen: Nontender nondistended good bowel sounds Musculoskeletal: No edema noted Skin: No rashes seen Neurologic: CN 2-12 grossly intact.  And nonfocal Psychiatric: Normal judgment and insight. Alert and oriented x 3. Normal mood.  Objective: Vitals:   07/28/22 1601 07/28/22 1900 07/28/22 2004 07/29/22 0612  BP: 130/70  (!) 148/78 (!) 143/65  Pulse: 68  66 (!) 57  Resp: 18  18 19   Temp: 98.3 F (36.8 C)  99 F (37.2 C) 98.7 F (37.1 C)  TempSrc: Oral  Oral   SpO2: 100%  100% 96%  Weight:  57.6 kg    Height:  5\' 6"  (1.676 m)      Intake/Output Summary (Last 24 hours) at 07/29/2022 0750 Last data filed at 07/29/2022 0600 Gross per 24 hour  Intake 820 ml  Output 1675 ml  Net -855 ml   Filed Weights   07/26/22 1405 07/28/22 1900  Weight: 57.6 kg 57.6 kg    Scheduled Meds:  azithromycin  250 mg Oral Daily   budesonide (PULMICORT) nebulizer solution  0.5 mg Nebulization BID   guaiFENesin  600 mg Oral BID  heparin  5,000 Units Subcutaneous Q8H   ipratropium-albuterol  3 mL Nebulization Q6H   lisinopril  20 mg Oral Q breakfast   methylPREDNISolone (SOLU-MEDROL) injection  40 mg Intravenous Q12H   Continuous Infusions:    Nutritional status     Body mass index is 20.5 kg/m.  Data Reviewed:   CBC: Recent Labs  Lab 07/25/22 1623 07/26/22 1404 07/28/22 0928 07/29/22 0209  WBC 7.0 6.5 6.8 5.8  NEUTROABS  --  4.4  --   --   HGB 12.6* 13.4 10.5* 9.5*  HCT 41.9 44.5 34.3* 30.7*  MCV 96.8 95.5 94.0 93.6  PLT 217 224 200  169   Basic Metabolic Panel: Recent Labs  Lab 07/25/22 1623 07/26/22 1404 07/28/22 0928 07/29/22 0209  NA 138 136 136 134*  K 4.0 4.0 4.0 3.9  CL 97* 97* 98 95*  CO2 31 28 30 31   GLUCOSE 129* 105* 148* 172*  BUN 8 9 7* 8  CREATININE 0.47* 0.53* 0.46* 0.63  CALCIUM 9.4 9.7 9.0 8.7*  MG  --   --  2.2 2.1   GFR: Estimated Creatinine Clearance: 73 mL/min (by C-G formula based on SCr of 0.63 mg/dL). Liver Function Tests: Recent Labs  Lab 07/26/22 1404  AST 22  ALT 10  ALKPHOS 91  BILITOT 0.5  PROT 8.0  ALBUMIN 4.3   No results for input(s): "LIPASE", "AMYLASE" in the last 168 hours. No results for input(s): "AMMONIA" in the last 168 hours. Coagulation Profile: No results for input(s): "INR", "PROTIME" in the last 168 hours. Cardiac Enzymes: No results for input(s): "CKTOTAL", "CKMB", "CKMBINDEX", "TROPONINI" in the last 168 hours. BNP (last 3 results) No results for input(s): "PROBNP" in the last 8760 hours. HbA1C: No results for input(s): "HGBA1C" in the last 72 hours. CBG: No results for input(s): "GLUCAP" in the last 168 hours. Lipid Profile: No results for input(s): "CHOL", "HDL", "LDLCALC", "TRIG", "CHOLHDL", "LDLDIRECT" in the last 72 hours. Thyroid Function Tests: No results for input(s): "TSH", "T4TOTAL", "FREET4", "T3FREE", "THYROIDAB" in the last 72 hours. Anemia Panel: No results for input(s): "VITAMINB12", "FOLATE", "FERRITIN", "TIBC", "IRON", "RETICCTPCT" in the last 72 hours. Sepsis Labs: Recent Labs  Lab 07/26/22 1411 07/26/22 1718  PROCALCITON  --  <0.10  LATICACIDVEN 1.5  --     Recent Results (from the past 240 hour(s))  Blood culture (routine x 2)     Status: None (Preliminary result)   Collection Time: 07/26/22  2:11 PM   Specimen: BLOOD RIGHT FOREARM  Result Value Ref Range Status   Specimen Description BLOOD RIGHT FOREARM  Final   Special Requests   Final    BOTTLES DRAWN AEROBIC AND ANAEROBIC Blood Culture adequate volume   Culture    Final    NO GROWTH 2 DAYS Performed at [facility], [address] [place], [place] [address]    Report Status PENDING  Incomplete  Blood culture (routine x 2)     Status: None (Preliminary result)   Collection Time: 07/26/22  5:18 PM   Specimen: BLOOD  Result Value Ref Range Status   Specimen Description BLOOD LEFT ANTECUBITAL  Final   Special Requests   Final    BOTTLES DRAWN AEROBIC AND ANAEROBIC Blood Culture results may not be optimal due to an inadequate volume of blood received in culture bottles   Culture   Final    NO GROWTH 2 DAYS Performed at [facility], [address], [place], [place] [address]  Report Status PENDING  Incomplete  MRSA Next Gen by PCR, Nasal     Status: Abnormal   Collection Time: 07/27/22 12:39 AM   Specimen: Nasal Mucosa; Nasal Swab  Result Value Ref Range Status   MRSA by PCR Next Gen (A) NOT DETECTED Final    INVALID, UNABLE TO DETERMINE THE PRESENCE OF TARGET DUE TO SPECIMEN INTEGRITY. RECOLLECTION REQUESTED.    Comment: RESULT CALLED TO, READ BACK BY AND VERIFIED WITH: CHERYL,RN@[id number]  07/27/22 MK Performed at [facility], [address] [place], [place] [address]   MRSA culture     Status: None (Preliminary result)   Collection Time: 07/27/22  2:20 AM   Specimen: Nasal  Result Value Ref Range Status   Specimen Description NASAL SWAB  Final   Special Requests NONE  Final   Culture   Final    CULTURE REINCUBATED FOR BETTER GROWTH Performed at [facility] Lab, [address] [place], [place] [address]    Report Status PENDING  Incomplete  Respiratory (~20 pathogens) panel by PCR     Status: None   Collection Time: 07/27/22  8:19 AM   Specimen: Nasopharyngeal Swab; Respiratory  Result Value Ref Range Status   Adenovirus NOT DETECTED NOT DETECTED Final   Coronavirus 229E NOT DETECTED NOT DETECTED Final    Comment: (NOTE) The Coronavirus on the Respiratory Panel, DOES NOT test for the novel  Coronavirus (2019  nCoV)    Coronavirus HKU1 NOT DETECTED NOT DETECTED Final   Coronavirus NL63 NOT DETECTED NOT DETECTED Final   Coronavirus OC43 NOT DETECTED NOT DETECTED Final   Metapneumovirus NOT DETECTED NOT DETECTED Final   Rhinovirus / Enterovirus NOT DETECTED NOT DETECTED Final   Influenza A NOT DETECTED NOT DETECTED Final   Influenza B NOT DETECTED NOT DETECTED Final   Parainfluenza Virus 1 NOT DETECTED NOT DETECTED Final   Parainfluenza Virus 2 NOT DETECTED NOT DETECTED Final   Parainfluenza Virus 3 NOT DETECTED NOT DETECTED Final   Parainfluenza Virus 4 NOT DETECTED NOT DETECTED Final   Respiratory Syncytial Virus NOT DETECTED NOT DETECTED Final   Bordetella pertussis NOT DETECTED NOT DETECTED Final   Bordetella Parapertussis NOT DETECTED NOT DETECTED Final   Chlamydophila pneumoniae NOT DETECTED NOT DETECTED Final   Mycoplasma pneumoniae NOT DETECTED NOT DETECTED Final    Comment: Performed at [facility] Lab, [address], [place], [place] [address]  SARS Coronavirus 2 by RT PCR (hospital order, performed in [facility] hospital lab) *cepheid single result test* Anterior Nasal Swab     Status: None   Collection Time: 07/27/22 10:09 AM   Specimen: Anterior Nasal Swab  Result Value Ref Range Status   SARS Coronavirus 2 by RT PCR NEGATIVE NEGATIVE Final    Comment: Performed at [facility] Lab, [address] [place], [place] [address]         Radiology Studies: No results found.         LOS: 2 days   Time spent= 35 mins    Carlyne Keehan Joline Maxcy, MD [facility]  If 7PM-7AM, please contact night-coverage  07/29/2022, 7:50 AM

## 2022-07-30 DIAGNOSIS — S78112A Complete traumatic amputation at level between left hip and knee, initial encounter: Secondary | ICD-10-CM | POA: Diagnosis not present

## 2022-07-30 DIAGNOSIS — I10 Essential (primary) hypertension: Secondary | ICD-10-CM | POA: Diagnosis not present

## 2022-07-30 DIAGNOSIS — J9611 Chronic respiratory failure with hypoxia: Secondary | ICD-10-CM | POA: Diagnosis not present

## 2022-07-30 DIAGNOSIS — J441 Chronic obstructive pulmonary disease with (acute) exacerbation: Secondary | ICD-10-CM | POA: Diagnosis not present

## 2022-07-30 LAB — BASIC METABOLIC PANEL
Anion gap: 9 (ref 5–15)
BUN: 12 mg/dL (ref 8–23)
CO2: 32 mmol/L (ref 22–32)
Calcium: 8.9 mg/dL (ref 8.9–10.3)
Chloride: 92 mmol/L — ABNORMAL LOW (ref 98–111)
Creatinine, Ser: 0.68 mg/dL (ref 0.61–1.24)
GFR, Estimated: >60 mL/min (ref >60)
Glucose, Bld: 185 mg/dL — ABNORMAL HIGH (ref 70–99)
Potassium: 4.5 mmol/L (ref 3.5–5.1)
Sodium: 133 mmol/L — ABNORMAL LOW (ref 135–145)

## 2022-07-30 LAB — CBC
HCT: 33 % — ABNORMAL LOW (ref 39.0–52.0)
Hemoglobin: 10.3 g/dL — ABNORMAL LOW (ref 13.0–17.0)
MCH: 28.9 pg (ref 26.0–34.0)
MCHC: 31.2 g/dL (ref 30.0–36.0)
MCV: 92.7 fL (ref 80.0–100.0)
Platelets: 208 10*3/uL (ref 150–400)
RBC: 3.56 MIL/uL — ABNORMAL LOW (ref 4.22–5.81)
RDW: 15.4 % (ref 11.5–15.5)
WBC: 7.4 10*3/uL (ref 4.0–10.5)
nRBC: 0 % (ref 0.0–0.2)

## 2022-07-30 LAB — CULTURE, BLOOD (ROUTINE X 2)

## 2022-07-30 LAB — MAGNESIUM: Magnesium: 2.2 mg/dL (ref 1.7–2.4)

## 2022-07-30 LAB — GLUCOSE, CAPILLARY: Glucose-Capillary: 140 mg/dL — ABNORMAL HIGH (ref 70–99)

## 2022-07-30 MED ORDER — IPRATROPIUM-ALBUTEROL 0.5-2.5 (3) MG/3ML IN SOLN
3.0000 mL | Freq: Two times a day (BID) | RESPIRATORY_TRACT | Status: DC
  Administered 2022-07-31 – 2022-08-02 (×5): 3 mL via RESPIRATORY_TRACT
  Filled 2022-07-30 (×5): qty 3

## 2022-07-30 NOTE — TOC Progression Note (Signed)
Transition of Care [facility]) - Progression Note    Patient Details  Name: [name] MRN: [id number] Date of Birth: 01-29-1956  Transition of Care [facility]) CM/SW Contact  Tom-Johnson, Hershal Coria, RN Phone Number: 07/30/2022, 10:52 AM  Clinical Narrative:     CM spoke with patient at bedside about Home Health recommendations. Patient states he has no preference. CM called in referral to [facility] and Cindie noted acceptance, info on AVS.  CM will continue to follow as patient progresses with care towards discharge.        Expected Discharge Plan and Services                                               Social Determinants of Health (SDOH) Interventions SDOH Screenings   Food Insecurity: No Food Insecurity (07/26/2022)  Housing: Low Risk  (07/26/2022)  Transportation Needs: No Transportation Needs (07/26/2022)  Utilities: Not At Risk (07/26/2022)  Depression (PHQ2-9): Low Risk  (07/22/2019)  Tobacco Use: High Risk (07/26/2022)    Readmission Risk Interventions     No data to display

## 2022-07-30 NOTE — Progress Notes (Signed)
[facility]  PROGRESS NOTE  FORDYCE ELM ZOX:[id number] DOB: [birth year] DOA: 07/26/2022 PCP: Fleet Contras, MD   Brief HPI:   67 year old African-American male with history of COPD, chronic hypoxia on 2 L nasal cannula, HTN, left-sided AKA, chronic tobacco use comes to the ED with increasing shortness of breath and sputum production.  Patient admitted with COPD exacerbation, started on steroids, bronchodilators.  Respiratory panel, COVID-19 was negative.  PT/OT recommended home health.    Assessment/Plan:   COPD exacerbation -Improved, still has scattered wheezing at lung bases -Continue IV Solu-Medrol, flutter valve -Procalcitonin was negative, Rocephin was discontinued -Continue Zithromax for 5 days -Respiratory panel and COVID-19 test was negative -Continue Pulmicort, Mucinex -Continue DuoNebs every 6 hours scheduled  Chronic respiratory failure with hypoxemia -On home O2 therapy, 2 L/min  Above-knee amputation of left lower extremity -Stable  Hypertension -Continue lisinopril -Blood pressure is stable    Medications     azithromycin  250 mg Oral Daily   budesonide (PULMICORT) nebulizer solution  0.5 mg Nebulization BID   guaiFENesin  600 mg Oral BID   heparin  5,000 Units Subcutaneous Q8H   ipratropium-albuterol  3 mL Nebulization Q6H   lisinopril  20 mg Oral Q breakfast   methylPREDNISolone (SOLU-MEDROL) injection  40 mg Intravenous Q12H     Data Reviewed:   CBG:  Recent Labs  Lab 07/29/22 2222 07/30/22 1117  GLUCAP 228* 140*    SpO2: 98 % O2 Flow Rate (L/min): 3 L/min    Vitals:   07/29/22 1904 07/30/22 0309 07/30/22 0804 07/30/22 0829  BP: (!) 149/87 117/66  132/72  Pulse: 76 73  74  Resp: 18 16  18   Temp: 97.7 F (36.5 C) 98.3 F (36.8 C)  98.4 F (36.9 C)  TempSrc: Oral Oral  Oral  SpO2: 100% 100% 99% 98%  Weight:      Height:          Data Reviewed:  Basic Metabolic Panel: Recent Labs  Lab 07/25/22 1623  07/26/22 1404 07/28/22 0928 07/29/22 0209 07/30/22 0156  NA 138 136 136 134* 133*  K 4.0 4.0 4.0 3.9 4.5  CL 97* 97* 98 95* 92*  CO2 31 28 30 31  32  GLUCOSE 129* 105* 148* 172* 185*  BUN 8 9 7* 8 12  CREATININE 0.47* 0.53* 0.46* 0.63 0.68  CALCIUM 9.4 9.7 9.0 8.7* 8.9  MG  --   --  2.2 2.1 2.2    CBC: Recent Labs  Lab 07/25/22 1623 07/26/22 1404 07/28/22 0928 07/29/22 0209 07/30/22 0156  WBC 7.0 6.5 6.8 5.8 7.4  NEUTROABS  --  4.4  --   --   --   HGB 12.6* 13.4 10.5* 9.5* 10.3*  HCT 41.9 44.5 34.3* 30.7* 33.0*  MCV 96.8 95.5 94.0 93.6 92.7  PLT 217 224 200 169 208    LFT Recent Labs  Lab 07/26/22 1404  AST 22  ALT 10  ALKPHOS 91  BILITOT 0.5  PROT 8.0  ALBUMIN 4.3     Antibiotics: Anti-infectives (From admission, onward)    Start     Dose/Rate Route Frequency Ordered Stop   07/27/22 1000  azithromycin (ZITHROMAX) tablet 250 mg        250 mg Oral Daily 07/27/22 0816 08/01/22 0959   07/26/22 2215  cefTRIAXone (ROCEPHIN) 1 g in sodium chloride 0.9 % 100 mL IVPB  Status:  Discontinued        1 g 200 mL/hr over 30 Minutes Intravenous Daily  at bedtime 07/26/22 2213 07/27/22 1155   07/26/22 1900  azithromycin (ZITHROMAX) 500 mg in sodium chloride 0.9 % 250 mL IVPB        500 mg 250 mL/hr over 60 Minutes Intravenous  Once 07/26/22 1849 07/26/22 2058        DVT prophylaxis: Heparin  Code Status: Full code  Family Communication: No family at bedside   CONSULTS    Subjective   Still complains of shortness of breath.   Objective    Physical Examination:   General-appears in no acute distress Heart-S1-S2, regular, no murmur auscultated Lungs-scattered wheezing at lung bases Abdomen-soft, nontender, no organomegaly Extremities-no edema in the lower extremities Neuro-alert, oriented x3, no focal deficit noted   Status is: Inpatient:             Meredeth Ide   [facility] If 7PM-7AM, please contact night-coverage at  [email or web address], Office  [phone]   07/30/2022, 1:35 PM  LOS: 3 days

## 2022-07-31 DIAGNOSIS — J9611 Chronic respiratory failure with hypoxia: Secondary | ICD-10-CM | POA: Diagnosis not present

## 2022-07-31 DIAGNOSIS — I10 Essential (primary) hypertension: Secondary | ICD-10-CM | POA: Diagnosis not present

## 2022-07-31 DIAGNOSIS — E871 Hypo-osmolality and hyponatremia: Secondary | ICD-10-CM

## 2022-07-31 DIAGNOSIS — J441 Chronic obstructive pulmonary disease with (acute) exacerbation: Secondary | ICD-10-CM | POA: Diagnosis not present

## 2022-07-31 DIAGNOSIS — S78112A Complete traumatic amputation at level between left hip and knee, initial encounter: Secondary | ICD-10-CM | POA: Diagnosis not present

## 2022-07-31 LAB — CBC
HCT: 32.7 % — ABNORMAL LOW (ref 39.0–52.0)
Hemoglobin: 10.1 g/dL — ABNORMAL LOW (ref 13.0–17.0)
MCH: 28.9 pg (ref 26.0–34.0)
MCHC: 30.9 g/dL (ref 30.0–36.0)
MCV: 93.4 fL (ref 80.0–100.0)
Platelets: 202 10*3/uL (ref 150–400)
RBC: 3.5 MIL/uL — ABNORMAL LOW (ref 4.22–5.81)
RDW: 15.8 % — ABNORMAL HIGH (ref 11.5–15.5)
WBC: 6.7 10*3/uL (ref 4.0–10.5)
nRBC: 0.3 % — ABNORMAL HIGH (ref 0.0–0.2)

## 2022-07-31 LAB — BASIC METABOLIC PANEL
Anion gap: 6 (ref 5–15)
BUN: 12 mg/dL (ref 8–23)
CO2: 31 mmol/L (ref 22–32)
Calcium: 8.4 mg/dL — ABNORMAL LOW (ref 8.9–10.3)
Chloride: 93 mmol/L — ABNORMAL LOW (ref 98–111)
Creatinine, Ser: 0.53 mg/dL — ABNORMAL LOW (ref 0.61–1.24)
GFR, Estimated: >60 mL/min (ref >60)
Glucose, Bld: 148 mg/dL — ABNORMAL HIGH (ref 70–99)
Potassium: 4.5 mmol/L (ref 3.5–5.1)
Sodium: 130 mmol/L — ABNORMAL LOW (ref 135–145)

## 2022-07-31 LAB — CULTURE, BLOOD (ROUTINE X 2): Special Requests: ADEQUATE

## 2022-07-31 LAB — MAGNESIUM: Magnesium: 2.2 mg/dL (ref 1.7–2.4)

## 2022-07-31 MED ORDER — PREDNISONE 20 MG PO TABS
40.0000 mg | ORAL_TABLET | Freq: Every day | ORAL | Status: DC
  Administered 2022-07-31 – 2022-08-02 (×3): 40 mg via ORAL
  Filled 2022-07-31 (×3): qty 2

## 2022-07-31 MED ORDER — PREDNISONE 20 MG PO TABS: 40.0000 mg | ORAL_TABLET | Freq: Every day | ORAL | Status: DC

## 2022-07-31 NOTE — Progress Notes (Signed)
[facility]  PROGRESS NOTE  [name] ZOX:[id number] DOB: 12-12-1955 DOA: 07/26/2022 PCP: Fleet Contras, MD   Brief HPI:   67 year old African-American male with history of COPD, chronic hypoxia on 2 L nasal cannula, HTN, left-sided AKA, chronic tobacco use comes to the ED with increasing shortness of breath and sputum production.  Patient admitted with COPD exacerbation, started on steroids, bronchodilators.  Respiratory panel, COVID-19 was negative.  PT/OT recommended home health.    Assessment/Plan:   COPD exacerbation -Improved, still has coughing -Will change IV Solu-Medrol to prednisone 40 mg daily, continue flutter valve -Procalcitonin was negative, Rocephin was discontinued -Continue Zithromax for 5 days -Respiratory panel and COVID-19 test was negative -Continue Pulmicort, Mucinex -Continue DuoNebs every 6 hours scheduled  Chronic respiratory failure with hypoxemia -On home O2 therapy, 2 L/min  Above-knee amputation of left lower extremity -Stable  Hypertension -Continue lisinopril -Blood pressure is stable  Hyponatremia; mild -Sodium is decreasing, today sodium is 130 -Will follow serum sodium in a.m. -If sodium is stable or improves, can be discharged home on prednisone taper   Medications     azithromycin  250 mg Oral Daily   budesonide (PULMICORT) nebulizer solution  0.5 mg Nebulization BID   guaiFENesin  600 mg Oral BID   heparin  5,000 Units Subcutaneous Q8H   ipratropium-albuterol  3 mL Nebulization BID   lisinopril  20 mg Oral Q breakfast   predniSONE  40 mg Oral Q breakfast     Data Reviewed:   CBG:  Recent Labs  Lab 07/29/22 2222 07/30/22 1117  GLUCAP 228* 140*    SpO2: 99 % O2 Flow Rate (L/min): 2 L/min    Vitals:   07/30/22 2018 07/30/22 2046 07/31/22 0503 07/31/22 0813  BP: (!) 127/93  (!) 147/96   Pulse: 69  78   Resp: (!) 21  15   Temp: 98.3 F (36.8 C)  98.1 F (36.7 C)   TempSrc: Oral  Oral   SpO2: 100%  97% 100% 99%  Weight:   55.1 kg   Height:          Data Reviewed:  Basic Metabolic Panel: Recent Labs  Lab 07/26/22 1404 07/28/22 0928 07/29/22 0209 07/30/22 0156 07/31/22 0407  NA 136 136 134* 133* 130*  K 4.0 4.0 3.9 4.5 4.5  CL 97* 98 95* 92* 93*  CO2 28 30 31  32 31  GLUCOSE 105* 148* 172* 185* 148*  BUN 9 7* 8 12 12   CREATININE 0.53* 0.46* 0.63 0.68 0.53*  CALCIUM 9.7 9.0 8.7* 8.9 8.4*  MG  --  2.2 2.1 2.2 2.2    CBC: Recent Labs  Lab 07/26/22 1404 07/28/22 0928 07/29/22 0209 07/30/22 0156 07/31/22 0407  WBC 6.5 6.8 5.8 7.4 6.7  NEUTROABS 4.4  --   --   --   --   HGB 13.4 10.5* 9.5* 10.3* 10.1*  HCT 44.5 34.3* 30.7* 33.0* 32.7*  MCV 95.5 94.0 93.6 92.7 93.4  PLT 224 200 169 208 202    LFT Recent Labs  Lab 07/26/22 1404  AST 22  ALT 10  ALKPHOS 91  BILITOT 0.5  PROT 8.0  ALBUMIN 4.3     Antibiotics: Anti-infectives (From admission, onward)    Start     Dose/Rate Route Frequency Ordered Stop   07/27/22 1000  azithromycin (ZITHROMAX) tablet 250 mg        250 mg Oral Daily 07/27/22 0816 08/01/22 0959   07/26/22 2215  cefTRIAXone (ROCEPHIN) 1 g  in sodium chloride 0.9 % 100 mL IVPB  Status:  Discontinued        1 g 200 mL/hr over 30 Minutes Intravenous Daily at bedtime 07/26/22 2213 07/27/22 1155   07/26/22 1900  azithromycin (ZITHROMAX) 500 mg in sodium chloride 0.9 % 250 mL IVPB        500 mg 250 mL/hr over 60 Minutes Intravenous  Once 07/26/22 1849 07/26/22 2058        DVT prophylaxis: Heparin  Code Status: Full code  Family Communication: No family at bedside   CONSULTS    Subjective   Still complains of coughing.  Sodium is going down, today sodium is 130.   Objective    Physical Examination:   General-appears in no acute distress Heart-S1-S2, regular, no murmur auscultated Lungs-clear to auscultation bilaterally, no wheezing or crackles auscultated Abdomen-soft, nontender, no organomegaly Extremities-no edema in the  lower extremities Neuro-alert, oriented x3, no focal deficit noted  Status is: Inpatient:             Meredeth Ide   [facility] If 7PM-7AM, please contact night-coverage at [email or web address], Office  [phone]   07/31/2022, 9:10 AM  LOS: 4 days

## 2022-08-01 DIAGNOSIS — E871 Hypo-osmolality and hyponatremia: Secondary | ICD-10-CM | POA: Diagnosis not present

## 2022-08-01 DIAGNOSIS — J441 Chronic obstructive pulmonary disease with (acute) exacerbation: Secondary | ICD-10-CM | POA: Diagnosis not present

## 2022-08-01 LAB — BASIC METABOLIC PANEL
Anion gap: 7 (ref 5–15)
BUN: 12 mg/dL (ref 8–23)
CO2: 35 mmol/L — ABNORMAL HIGH (ref 22–32)
Calcium: 8.3 mg/dL — ABNORMAL LOW (ref 8.9–10.3)
Chloride: 89 mmol/L — ABNORMAL LOW (ref 98–111)
Creatinine, Ser: 0.55 mg/dL — ABNORMAL LOW (ref 0.61–1.24)
GFR, Estimated: >60 mL/min (ref >60)
Glucose, Bld: 116 mg/dL — ABNORMAL HIGH (ref 70–99)
Potassium: 4.4 mmol/L (ref 3.5–5.1)
Sodium: 131 mmol/L — ABNORMAL LOW (ref 135–145)

## 2022-08-01 LAB — CBC
HCT: 34 % — ABNORMAL LOW (ref 39.0–52.0)
Hemoglobin: 10.5 g/dL — ABNORMAL LOW (ref 13.0–17.0)
MCH: 29.5 pg (ref 26.0–34.0)
MCHC: 30.9 g/dL (ref 30.0–36.0)
MCV: 95.5 fL (ref 80.0–100.0)
Platelets: 217 10*3/uL (ref 150–400)
RBC: 3.56 MIL/uL — ABNORMAL LOW (ref 4.22–5.81)
RDW: 15.8 % — ABNORMAL HIGH (ref 11.5–15.5)
WBC: 5.9 10*3/uL (ref 4.0–10.5)
nRBC: 0.3 % — ABNORMAL HIGH (ref 0.0–0.2)

## 2022-08-01 LAB — MAGNESIUM: Magnesium: 2.2 mg/dL (ref 1.7–2.4)
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
## 2022-08-01 NOTE — Progress Notes (Signed)
Progress Note   [name]:[id number] DOB: 12-May-1955 DOA: 07/26/2022     5 DOS: the patient was seen and examined on 08/01/2022   Brief hospital course: 67yo male with h/o COPD on 2L home O2, HTN, and PVD s/p L AKA who presented on 6/11 with SOB, cough.  He was admitted for COPD exacerbation, treated with Azithromycin and steroids.  Noted to have hyponatremia in 130 range.   Assessment and Plan: * COPD exacerbation (HCC) Acute on chronic respiratory failure associated with a COPD exacerbation -Patient's shortness of breath and productive cough are most likely caused by acute COPD exacerbation.  -He has been treated with Rocephin/Azithro and has completed treatment -He remains on daily prednisone, will plan for taper -Nebulizers: scheduled Duoneb and prn Duoneb, standing Pulmicort; also on Mucinex and Robitussin -He is significantly improved and is appropriate for dc to home in AM -Needs strict smoking cessation  Chronic respiratory failure with hypoxia, on home O2 therapy (HCC) - 2 L/min -Continue with home O2, on 2L  Above knee amputation of left lower extremity (HCC) -Pt with small granuloma at the end of his AKA. -Wound care has consulted:  Cleanse left AKA site with soap and water and pat dry. Apply Xeroform gauze to open wound. Cover with foam dressing. Change daily.   Essential hypertension -Continue with lisinopril -Will add prn IV hydralazine and metoprolol  Hyponatremia Recent Labs  Lab 07/25/22 1623 07/26/22 1404 07/28/22 0928 07/29/22 0209 07/30/22 0156 07/31/22 0407 08/01/22 0429  NA 138 136 136 134* 133* 130* 131*  -Not present on admission but was progressively worsening -Appears to be stable at this time -Will recheck BMP again in AM     Subjective: Patient reports no specific complaints today but also really doesn't want to go home and thinks he needs another day.  He is unable to explain why but is fairly adamant about this.  He does  note that his daughter "fusses a lot."    I spoke with his daughter.  She talked to him yesterday and said he was pretty okay.  She would like for him to come home with an inhaler.   Physical Exam: Vitals:   08/01/22 0435 08/01/22 0727 08/01/22 0757 08/01/22 1517  BP: 129/88  114/72 112/75  Pulse: 69 72 72 81  Resp: 16 17 17    Temp: 97.8 F (36.6 C)  (!) 97.5 F (36.4 C) 98.6 F (37 C)  TempSrc: Oral  Oral Oral  SpO2: 100% 99% 100% 100%  Weight: 55 kg     Height:       General:  Appears calm and comfortable and is in NAD, on home O2 Eyes:  EOMI, normal lids, iris ENT:  grossly normal hearing, lips & tongue, mmm Neck:  no LAD, masses or thyromegaly Cardiovascular:  RRR, no m/r/g. No LE edema.  Respiratory:   CTA bilaterally with no wheezes/rales/rhonchi.  Normal respiratory effort. Abdomen:  soft, NT, ND Skin:  no rash or induration seen on limited exam; L AKA stump has a dressing on it that is clean and dry Musculoskeletal:  grossly normal tone BUE/BLE, good ROM, no bony abnormality, s/p L AKA Psychiatric:  grossly normal mood and affect, speech fluent and appropriate, AOx3 Neurologic:  CN 2-12 grossly intact, moves all extremities in coordinated fashion   Pertinent labs:    Na++ 131, stable CO2 35 Glucose 116 WBC 5.9 Hgb 10.5   Family Communication: None present; I spoke with his daughter by telephone  today  Disposition: Status is: Inpatient Remains inpatient appropriate because: monitoring clinical condition  Planned Discharge Destination: Home with home health    Time spent: 50 minutes  Author: Jonah Blue, MD 08/01/2022 3:35 PM  For on call review [email or web address].

## 2022-08-01 NOTE — Assessment & Plan Note (Addendum)
Recent Labs  Lab 07/26/22 1404 07/28/22 0928 07/29/22 0209 07/30/22 0156 07/31/22 0407 08/01/22 0429 08/02/22 0233  NA 136 136 134* 133* 130* 131* 132*   -Not present on admission but was progressively worsening -Appears to be stable at this time -Suggest outpatient follow up with PCP

## 2022-08-01 NOTE — Hospital Course (Signed)
67yo male with h/o COPD on 2L home O2, HTN, and PVD s/p L AKA who presented on 6/11 with SOB, cough.  He was admitted for COPD exacerbation, treated with Azithromycin and steroids.  Noted to have hyponatremia in 130 range.

## 2022-08-01 NOTE — Progress Notes (Signed)
PT Cancellation Note  Patient Details Name: [name] MRN: [id number] DOB: 05/29/1955   Cancelled Treatment:    Reason Eval/Treat Not Completed: (P) Patient declined, no reason specified, pt declining all mobility despite max encouragement. Will check back as schedule allows to continue with PT POC.  Lenora Boys. PTA Acute Rehabilitation Services Office: [phone]    Catalina Antigua 08/01/2022, 3:35 PM

## 2022-08-02 ENCOUNTER — Emergency Department (HOSPITAL_COMMUNITY)
Admission: EM | Admit: 2022-08-02 | Discharge: 2022-08-02 | Disposition: A | Discharge status: Home / Self Care | Payer: 59 | Attending: Emergency Medicine | Admitting: Emergency Medicine

## 2022-08-02 DIAGNOSIS — R0602 Shortness of breath: Secondary | ICD-10-CM | POA: Diagnosis present

## 2022-08-02 DIAGNOSIS — J441 Chronic obstructive pulmonary disease with (acute) exacerbation: Secondary | ICD-10-CM | POA: Diagnosis not present

## 2022-08-02 LAB — BASIC METABOLIC PANEL
Anion gap: 7 (ref 5–15)
BUN: 12 mg/dL (ref 8–23)
CO2: 33 mmol/L — ABNORMAL HIGH (ref 22–32)
Calcium: 8.3 mg/dL — ABNORMAL LOW (ref 8.9–10.3)
Chloride: 92 mmol/L — ABNORMAL LOW (ref 98–111)
Creatinine, Ser: 0.56 mg/dL — ABNORMAL LOW (ref 0.61–1.24)
GFR, Estimated: >60 mL/min (ref >60)
Glucose, Bld: 140 mg/dL — ABNORMAL HIGH (ref 70–99)
Potassium: 4.2 mmol/L (ref 3.5–5.1)
Sodium: 132 mmol/L — ABNORMAL LOW (ref 135–145)

## 2022-08-02 LAB — CBC
HCT: 33.2 % — ABNORMAL LOW (ref 39.0–52.0)
Hemoglobin: 10.2 g/dL — ABNORMAL LOW (ref 13.0–17.0)
MCH: 28.8 pg (ref 26.0–34.0)
MCHC: 30.7 g/dL (ref 30.0–36.0)
MCV: 93.8 fL (ref 80.0–100.0)
Platelets: 233 10*3/uL (ref 150–400)
RBC: 3.54 MIL/uL — ABNORMAL LOW (ref 4.22–5.81)
RDW: 16.1 % — ABNORMAL HIGH (ref 11.5–15.5)
WBC: 8.3 10*3/uL (ref 4.0–10.5)
nRBC: 0.4 % — ABNORMAL HIGH (ref 0.0–0.2)

## 2022-08-02 LAB — MAGNESIUM: Magnesium: 2.2 mg/dL (ref 1.7–2.4)

## 2022-08-02 MED ORDER — IPRATROPIUM-ALBUTEROL 0.5-2.5 (3) MG/3ML IN SOLN
3.0000 mL | Freq: Once | RESPIRATORY_TRACT | Status: AC
  Administered 2022-08-02: 3 mL via RESPIRATORY_TRACT
  Filled 2022-08-02: qty 3

## 2022-08-02 MED ORDER — GUAIFENESIN ER 600 MG PO TB12: 600.0000 mg | ORAL_TABLET | Freq: Two times a day (BID) | ORAL | 1 refills | Status: DC

## 2022-08-02 MED ORDER — IPRATROPIUM-ALBUTEROL 0.5-2.5 (3) MG/3ML IN SOLN: 3.0000 mL | Freq: Four times a day (QID) | RESPIRATORY_TRACT | 1 refills | Status: DC

## 2022-08-02 MED ORDER — BUDESONIDE 0.5 MG/2ML IN SUSP: 0.5000 mg | Freq: Two times a day (BID) | RESPIRATORY_TRACT | 12 refills | Status: DC

## 2022-08-02 MED ORDER — PREDNISONE 20 MG PO TABS: ORAL_TABLET | ORAL | 0 refills | Status: AC

## 2022-08-02 NOTE — ED Notes (Signed)
Daughter called for pick up,  Voice Message left

## 2022-08-02 NOTE — Care Management (Signed)
ED RNCM met with patient at bedside to discuss home oxygen. Patient reports having oxygen at home but not wanting to reside at the location he is currently at with daughter's home. Patient was transported home by [facility]  and on arrival began to desat as per PTAR so patient was brought back to the ED.

## 2022-08-02 NOTE — TOC Progression Note (Addendum)
Transition of Care [facility]) - Progression Note    Patient Details  Name: [name] MRN: [id number] Date of Birth: 1955-04-14  [facility] CM/SW Contact  Tom-Johnson, Hershal Coria, RN Phone Number: 08/02/2022, 4:55 PM  Clinical Narrative:     CM notified that patient was brought back to the ED d/t  SOB without Home O2. CM verified with patient during assessment and prior to discharge of home O2. Patient confirmed he has home O2 and has a concentrator from [employer].  CM called and spoke with daughter, Abran Duke states they don't have electricity and would need a portable tank and a new O2 order. This information is new as it was not told to CM prior discharge.  CM contacted [employer] and spoke with Melissa. CM notified MD and new order placed. RN to document ambulation sats and [employer] to bring a portable tank to the ED for patient to discharge home with. CM verified this with Melissa at [facility].   17:50- CM called [employer] and spoke with Dalisha and informed her that patient is discharged, ambulation sats are documented and waiting on  portable O2 tank. Edison Pace states she will inform driver to deliver tank.  This CM will sign off at this time.        Barriers to Discharge: Barriers Resolved  Expected Discharge Plan and Services         Expected Discharge Date: 08/02/22               DME Arranged: N/A DME Agency: NA       HH Arranged: PT, OT, RN, Nurse's Aide HH Agency: [facility] Date [facility] Agency Contacted: 07/30/22 Time HH Agency Contacted: 1042 Representative spoke with at [facility] Agency: Cindie   Social Determinants of Health (SDOH) Interventions SDOH Screenings   Food Insecurity: No Food Insecurity (07/26/2022)  Housing: Low Risk  (07/26/2022)  Transportation Needs: No Transportation Needs (07/26/2022)  Utilities: Not At Risk (07/26/2022)  Depression (PHQ2-9): Low Risk  (07/22/2019)  Tobacco Use: High Risk (07/26/2022)    Readmission Risk  Interventions    08/02/2022   12:27 PM  Readmission Risk Prevention Plan  Post Dischage Appt Complete  Medication Screening Complete  Transportation Screening Complete

## 2022-08-02 NOTE — Discharge Summary (Signed)
Physician Discharge Summary   [name] MRN: [id number] DOB: 06-Nov-1955  Admit date:     07/26/2022  Discharge date: 08/02/22  Discharge Physician: Jonah Blue   PCP: Fleet Contras, MD   Recommendations at discharge:   Continue home oxygen as prescribed Taper prednisone as directed Use Duonebs 4 times daily, Pulmicort twice daily, and Albuterol inhaler or nebulizer treatments as needed Home PT and OT are ordered and Frances Furbish should be reaching out to you STOP smoking! A nicotine patch has been ordered Follow up with PCP in 1-2 weeks Follow up with pulmonology - you have been referred for lung cancer screening test Recheck BMP at follow up appointment Use foam dressing to stump wound and keep dressing clean and dry  Discharge Diagnoses: Principal Problem:   COPD exacerbation (HCC) Active Problems:   Essential hypertension   Above knee amputation of left lower extremity (HCC)   Chronic respiratory failure with hypoxia, on home O2 therapy (HCC) - 2 L/min   Hyponatremia    Hospital Course: 67yo male with h/o COPD on 2L home O2, HTN, and PVD s/p L AKA who presented on 6/11 with SOB, cough.  He was admitted for COPD exacerbation, treated with Azithromycin and steroids.  Noted to have hyponatremia in 130 range.  Assessment and Plan: * COPD exacerbation (HCC) -Patient's shortness of breath and productive cough are most likely caused by acute COPD exacerbation.  -He has been treated with Rocephin/Azithro and has completed treatment -He remains on daily prednisone, will plan for taper -Nebulizers: scheduled Duoneb and prn Duoneb, standing Pulmicort; also on Mucinex and Robitussin -He is significantly improved and is appropriate for dc to home -Needs strict smoking cessation - patch ordered  Chronic respiratory failure with hypoxia, on home O2 therapy (HCC) - 2 L/min -Continue with home O2, on 2L  Above knee amputation of left lower extremity (HCC) -Pt with small  granuloma at the end of his AKA. -Wound care has consulted:  Cleanse left AKA site with soap and water and pat dry. Apply Xeroform gauze to open wound. Cover with foam dressing. Change daily.   Essential hypertension -Continue with lisinopril  Hyponatremia Recent Labs  Lab 07/26/22 1404 07/28/22 0928 07/29/22 0209 07/30/22 0156 07/31/22 0407 08/01/22 0429 08/02/22 0233  NA 136 136 134* 133* 130* 131* 132*   -Not present on admission but was progressively worsening -Appears to be stable at this time -Suggest outpatient follow up with PCP      Pain control - [facility] Controlled Substance Reporting System database was reviewed. and patient was instructed, not to drive, operate heavy machinery, perform activities at heights, swimming or participation in water activities or provide baby-sitting services while on Pain, Sleep and Anxiety Medications; until their outpatient Physician has advised to do so again. Also recommended to not to take more than prescribed Pain, Sleep and Anxiety Medications.   Consultants: Wound care; TOC team; PT/OT Procedures performed: None  Disposition: Home Diet recommendation:  Cardiac diet DISCHARGE MEDICATION: Allergies as of 08/02/2022   No Known Allergies      Medication List     STOP taking these medications    ascorbic acid 1000 MG tablet Commonly known as: VITAMIN C   doxycycline 100 MG capsule Commonly known as: VIBRAMYCIN   Fluticasone-Salmeterol 250-50 MCG/DOSE Aepb Commonly known as: Advair Diskus   Vitamin D (Ergocalciferol) 1.25 MG (50000 UNIT) Caps capsule Commonly known as: DRISDOL       TAKE these medications    albuterol (  2.5 MG/3ML) 0.083% nebulizer solution Commonly known as: PROVENTIL Take 2.5 mg by nebulization every 6 (six) hours as needed for wheezing or shortness of breath.   albuterol 108 (90 Base) MCG/ACT inhaler Commonly known as: VENTOLIN HFA Inhale 2 puffs into the lungs every 4 (four) hours as  needed for wheezing or shortness of breath.   budesonide 0.5 MG/2ML nebulizer solution Commonly known as: PULMICORT Take 2 mLs (0.5 mg total) by nebulization 2 (two) times daily.   diclofenac 75 MG EC tablet Commonly known as: VOLTAREN Take 75 mg by mouth 2 (two) times daily as needed.   guaiFENesin 600 MG 12 hr tablet Commonly known as: MUCINEX Take 1 tablet (600 mg total) by mouth 2 (two) times daily.   ipratropium-albuterol 0.5-2.5 (3) MG/3ML Soln Commonly known as: DUONEB Take 3 mLs by nebulization 4 (four) times daily.   lisinopril 20 MG tablet Commonly known as: ZESTRIL Take 1 tablet (20 mg total) by mouth daily with breakfast.   nicotine 21 mg/24hr patch Commonly known as: NICODERM CQ - dosed in mg/24 hours Place 1 patch (21 mg total) onto the skin daily.   OXYGEN Inhale 1-3 L into the lungs See admin instructions. Use overnight and when resting in bed   predniSONE 20 MG tablet Commonly known as: DELTASONE Take 2 tablets (40 mg total) by mouth daily with breakfast for 2 days, THEN 1 tablet (20 mg total) daily with breakfast for 5 days, THEN 0.5 tablets (10 mg total) daily with breakfast for 5 days. Start taking on: August 03, 2022   tiZANidine 4 MG tablet Commonly known as: ZANAFLEX Take 1 tablet (4 mg total) by mouth every morning.   zinc sulfate 220 (50 Zn) MG capsule Take 1 capsule (220 mg total) by mouth daily.               Discharge Care Instructions  (From admission, onward)           Start     Ordered   08/02/22 0000  Discharge wound care:       Comments: Cleanse left AKA site with soap and water and pat dry.  Apply Xeroform gauze to open wound.  Cover with foam dressing. Change daily.   08/02/22 1055            Follow-up Information     Care, [facility] Follow up.   Specialty: Home Health Services Why: Someone will call you to schedule first home visit. If you have not received a call after two days of discharging home, call  their number listed. If no one comes to assess, call Case Manager at [phone]. Contact information: [address] [place] [address] [phone]                Discharge Exam:  Subjective:  He reports feeling ok today, agrees with plan for discharge and did not ask to stay another day.  Filed Weights   07/28/22 1900 07/31/22 0503 08/01/22 0435  Weight: 57.6 kg 55.1 kg 55 kg   Vitals:   08/02/22 0515 08/02/22 0902  BP: 132/71 112/72  Pulse: 67 84  Resp: 17 18  Temp: 98.3 F (36.8 C) 97.9 F (36.6 C)  SpO2: 100% 98%    General:  Appears calm and comfortable and is in NAD, on home O2 Eyes:  EOMI, normal lids, iris ENT:  grossly normal hearing, lips & tongue, mmm Neck:  no LAD, masses or thyromegaly Cardiovascular:  RRR, no m/r/g.  No LE edema.  Respiratory:   Scattered rhonchi.  Normal respiratory effort. Abdomen:  soft, NT, ND Skin:  no rash or induration seen on limited exam; L AKA stump has a dressing on it that is clean and dry Musculoskeletal:  grossly normal tone BUE/BLE, good ROM, no bony abnormality, s/p L AKA Psychiatric:  grossly normal mood and affect, speech fluent and appropriate, AOx3 Neurologic:  CN 2-12 grossly intact, moves all extremities in coordinated fashion  Pertinent labs:   Na++ 132 CO2 33 Glucose 140 WBC 8.3 Hgb 10.2   Condition at discharge: improving  The results of significant diagnostics from this hospitalization (including imaging, microbiology, ancillary and laboratory) are listed below for reference.   Imaging Studies: DG Femur Portable Min 2 Views Left  Result Date: 07/26/2022 CLINICAL DATA:  Purulence from left above the knee amputation stump. EXAM: LEFT FEMUR PORTABLE 2 VIEWS COMPARISON:  07/03/2020 FINDINGS: Post above the knee amputation. Irregularity at the distal aspect of the stump likely sequela of prior infection and surgery. No frank bony destructive or erosive changes. No fracture. There is no  soft tissue gas or radiopaque foreign body. Arterial vascular calcifications are seen. IMPRESSION: Post above the knee amputation with irregularity at the distal aspect of the stump likely sequela of prior infection and surgery. No radiographic findings of acute osteomyelitis. No soft tissue gas or radiopaque foreign body. Electronically Signed   By: Narda Rutherford M.D.   On: 07/26/2022 16:10   DG Chest Portable 1 View  Result Date: 07/26/2022 CLINICAL DATA:  Respiratory distress EXAM: PORTABLE CHEST 1 VIEW COMPARISON:  X-ray 07/25/2022 FINDINGS: Hyperinflation. No consolidation, pneumothorax or effusion. No edema. Normal cardiopericardial silhouette. Degenerative changes along the spine and shoulders. 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 IMPRESSION: Hyperinflation.  No acute cardiopulmonary disease. Electronically Signed   By: Karen Kays M.D.   On: 07/26/2022 16:07   DG Chest 2 View  Result Date: 07/25/2022 CLINICAL DATA:  Shortness of breath, chest pain EXAM: CHEST - 2 VIEW COMPARISON:  Previous studies including the examination of 05/30/2021 FINDINGS: Cardiac size is within normal limits. Increase in AP diameter of chest suggests COPD. There are no signs of pulmonary edema or focal pulmonary consolidation. There is no pleural effusion or pneumothorax. Deformity in the right scapula may be residual from previous injury. Degenerative changes are noted in right shoulder. 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 IMPRESSION: COPD. There are no signs of pulmonary edema or focal pulmonary consolidation. Electronically Signed   By: Ernie Avena M.D.   On: 07/25/2022 18:00    Microbiology: Results for orders placed or performed during the hospital encounter of 07/26/22  Blood culture (routine x 2)     Status: None   Collection Time: 07/26/22  2:11 PM   Specimen: BLOOD RIGHT FOREARM  Result Value Ref Range Status   Specimen  Description BLOOD RIGHT FOREARM  Final   Special Requests   Final    BOTTLES DRAWN AEROBIC AND ANAEROBIC Blood Culture adequate volume   Culture   Final    NO GROWTH 5 DAYS Performed at [facility], [address] [place], [place] [address]    Report Status 07/31/2022 FINAL  Final  Blood culture (routine x 2)     Status: None   Collection Time: 07/26/22  5:18 PM   Specimen: BLOOD  Result Value Ref Range Status   Specimen Description BLOOD LEFT ANTECUBITAL  Final  Special Requests   Final    BOTTLES DRAWN AEROBIC AND ANAEROBIC Blood Culture results may not be optimal due to an inadequate volume of blood received in culture bottles   Culture   Final    NO GROWTH 5 DAYS Performed at [facility], [address] [place], [place] [address]    Report Status 07/31/2022 FINAL  Final  MRSA Next Gen by PCR, Nasal     Status: Abnormal   Collection Time: 07/27/22 12:39 AM   Specimen: Nasal Mucosa; Nasal Swab  Result Value Ref Range Status   MRSA by PCR Next Gen (A) NOT DETECTED Final    INVALID, UNABLE TO DETERMINE THE PRESENCE OF TARGET DUE TO SPECIMEN INTEGRITY. RECOLLECTION REQUESTED.    Comment: RESULT CALLED TO, READ BACK BY AND VERIFIED WITH: CHERYL,RN@[id number]  07/27/22 MK Performed at [facility], [address] [place], [place] [address]   MRSA culture     Status: None   Collection Time: 07/27/22  2:20 AM   Specimen: Nasal  Result Value Ref Range Status   Specimen Description NASAL SWAB  Final   Special Requests NONE  Final   Culture   Final    NO MRSA DETECTED Performed at [facility], [address], [place], [place] [address]    Report Status 07/29/2022 FINAL  Final  Respiratory (~20 pathogens) panel by PCR     Status: None   Collection Time: 07/27/22  8:19 AM   Specimen: Nasopharyngeal Swab; Respiratory  Result Value Ref Range Status   Adenovirus NOT DETECTED NOT DETECTED Final   Coronavirus 229E NOT DETECTED NOT DETECTED Final    Comment:  (NOTE) The Coronavirus on the Respiratory Panel, DOES NOT test for the novel  Coronavirus (2019 nCoV)    Coronavirus HKU1 NOT DETECTED NOT DETECTED Final   Coronavirus NL63 NOT DETECTED NOT DETECTED Final   Coronavirus OC43 NOT DETECTED NOT DETECTED Final   Metapneumovirus NOT DETECTED NOT DETECTED Final   Rhinovirus / Enterovirus NOT DETECTED NOT DETECTED Final   Influenza A NOT DETECTED NOT DETECTED Final   Influenza B NOT DETECTED NOT DETECTED Final   Parainfluenza Virus 1 NOT DETECTED NOT DETECTED Final   Parainfluenza Virus 2 NOT DETECTED NOT DETECTED Final   Parainfluenza Virus 3 NOT DETECTED NOT DETECTED Final   Parainfluenza Virus 4 NOT DETECTED NOT DETECTED Final   Respiratory Syncytial Virus NOT DETECTED NOT DETECTED Final   Bordetella pertussis NOT DETECTED NOT DETECTED Final   Bordetella Parapertussis NOT DETECTED NOT DETECTED Final   Chlamydophila pneumoniae NOT DETECTED NOT DETECTED Final   Mycoplasma pneumoniae NOT DETECTED NOT DETECTED Final    Comment: Performed at [facility], [address], [place], [place] [address]  SARS Coronavirus 2 by RT PCR (hospital order, performed in [facility] [place] [facility] hospital lab) *cepheid single result test* Anterior Nasal Swab     Status: None   Collection Time: 07/27/22 10:09 AM   Specimen: Anterior Nasal Swab  Result Value Ref Range Status   SARS Coronavirus 2 by RT PCR NEGATIVE NEGATIVE Final    Comment: Performed at [facility], [address] [place], [place] [address]       Discharge time spent: greater than 30 minutes.  Signed: Jonah Blue, MD [facility] 08/02/2022

## 2022-08-02 NOTE — ED Provider Notes (Signed)
[place] EMERGENCY DEPARTMENT AT [facility] Provider Note   CSN: [id number] Arrival date & time: 08/02/22  1549     History  Chief Complaint  Patient presents with   Shortness of Breath    [name] is a 67 y.o. male.  Patient discharged today.  He was also go home with oxygen but when he got to his place of living there is no oxygen and was brought back to the emergency department for social work consultation.  He is requesting that he go live with his daughter somewhere else.  He has no symptoms.  No chest pain or shortness of breath.  The history is provided by the patient and the EMS personnel.       Home Medications Prior to Admission medications   Medication Sig Start Date End Date Taking? Authorizing Provider  albuterol (PROVENTIL) (2.5 MG/3ML) 0.083% nebulizer solution Take 2.5 mg by nebulization every 6 (six) hours as needed for wheezing or shortness of breath.    [provider]  albuterol (VENTOLIN HFA) 108 (90 Base) MCG/ACT inhaler Inhale 2 puffs into the lungs every 4 (four) hours as needed for wheezing or shortness of breath. Patient not taking: Reported on 07/27/2022 06/09/20   Medina-Vargas, Monina C, NP  budesonide (PULMICORT) 0.5 MG/2ML nebulizer solution Take 2 mLs (0.5 mg total) by nebulization 2 (two) times daily. 08/02/22   Jonah Blue, MD  diclofenac (VOLTAREN) 75 MG EC tablet Take 75 mg by mouth 2 (two) times daily as needed. 02/03/22   [provider]  guaiFENesin (MUCINEX) 600 MG 12 hr tablet Take 1 tablet (600 mg total) by mouth 2 (two) times daily. 08/02/22   Jonah Blue, MD  ipratropium-albuterol (DUONEB) 0.5-2.5 (3) MG/3ML SOLN Take 3 mLs by nebulization 4 (four) times daily. 08/02/22   Jonah Blue, MD  lisinopril (ZESTRIL) 20 MG tablet Take 1 tablet (20 mg total) by mouth daily with breakfast. 06/09/20   Medina-Vargas, Monina C, NP  nicotine (NICODERM CQ - DOSED IN MG/24 HOURS) 21 mg/24hr patch Place 1  patch (21 mg total) onto the skin daily. 06/09/20   Medina-Vargas, Monina C, NP  OXYGEN Inhale 1-3 L into the lungs See admin instructions. Use overnight and when resting in bed    [provider]  predniSONE (DELTASONE) 20 MG tablet Take 2 tablets (40 mg total) by mouth daily with breakfast for 2 days, THEN 1 tablet (20 mg total) daily with breakfast for 5 days, THEN 0.5 tablets (10 mg total) daily with breakfast for 5 days. 08/03/22 08/15/22  Jonah Blue, MD  tiZANidine (ZANAFLEX) 4 MG tablet Take 1 tablet (4 mg total) by mouth every morning. 06/09/20   Medina-Vargas, Monina C, NP  zinc sulfate 220 (50 Zn) MG capsule Take 1 capsule (220 mg total) by mouth daily. 06/09/20   Medina-Vargas, Monina C, NP      Allergies    Patient has no known allergies.    Review of Systems   Review of Systems  Physical Exam Updated Vital Signs BP (!) 131/92 (BP Location: Left Arm)   Pulse 85   Temp 98.2 F (36.8 C) (Oral)   Resp 16   SpO2 100%  Physical Exam Vitals and nursing note reviewed.  Constitutional:      General: He is not in acute distress.    Appearance: He is well-developed.  HENT:     Head: Normocephalic and atraumatic.  Eyes:     Extraocular Movements: Extraocular movements intact.  Conjunctiva/sclera: Conjunctivae normal.     Pupils: Pupils are equal, round, and reactive to light.  Cardiovascular:     Rate and Rhythm: Normal rate and regular rhythm.     Heart sounds: No murmur heard. Pulmonary:     Effort: Pulmonary effort is normal. No respiratory distress.     Breath sounds: Normal breath sounds.  Abdominal:     Palpations: Abdomen is soft.     Tenderness: There is no abdominal tenderness.  Musculoskeletal:        General: No swelling.     Cervical back: Normal range of motion and neck supple.  Skin:    General: Skin is warm and dry.     Capillary Refill: Capillary refill takes less than 2 seconds.  Neurological:     Mental Status: He is alert.  Psychiatric:         Mood and Affect: Mood normal.     ED Results / Procedures / Treatments   Labs (all labs ordered are listed, but only abnormal results are displayed) Labs Reviewed - No data to display  EKG None  Radiology No results found.  Procedures Procedures    Medications Ordered in ED Medications  ipratropium-albuterol (DUONEB) 0.5-2.5 (3) MG/3ML nebulizer solution 3 mL (3 mLs Nebulization Given 08/02/22 1614)    ED Course/ Medical Decision Making/ A&P                             Medical Decision Making Risk Prescription drug management.   [name] is here for social issue.  Was discharged from the hospital today when he arrived at the place he was living in the oxygen that he was post had delivered to the house was not there.  He was brought back.  Social work and case management have been consulted and will work on getting him back home with his oxygen.  He is asymptomatic and appears well.  Normal vitals.  Discharged.  This chart was dictated using voice recognition software.  Despite best efforts to proofread,  errors can occur which can change the documentation meaning.         Final Clinical Impression(s) / ED Diagnoses Final diagnoses:  Shortness of breath    Rx / DC Orders ED Discharge Orders     None         Virgina Norfolk, DO 08/02/22 1633

## 2022-08-02 NOTE — Care Management (Addendum)
ED RNCM spoke with Inpatient CM who spoke with [employer] company about securing home oxygen setup. Orders were obtained along with oxygen qualifying note was sent into [employer]. Patient awa8iting a  portable tank to be transported home and daughter [name] will come to pick patient up. Updated  Renae Gloss ED RN.

## 2022-08-02 NOTE — Discharge Planning (Signed)
Oxygen Qualifications Patient Saturations on Room Air at Rest = 97%  Patient Saturations on Room Air while Ambulating = 88% (AKA position changes)  Patient Saturations on 2 Liters of oxygen while Ambulating = 98%  Please briefly explain why patient needs home oxygen:  Michel Bickers RN, BSN CNOR ED RN Care Manager [phone]

## 2022-08-02 NOTE — Progress Notes (Signed)
Mobility Specialist Progress Note   08/02/22 1005  Mobility  Activity Transferred from bed to chair  Level of Assistance Minimal assist, patient does 75% or more  Assistive Device Other (Comment) (HHA)  Distance Ambulated (ft) 2 ft  Activity Response Tolerated well  Mobility Referral Yes  $Mobility charge 1 Mobility  Mobility Specialist Start Time (ACUTE ONLY) 1005  Mobility Specialist Stop Time (ACUTE ONLY) 1030  Mobility Specialist Time Calculation (min) (ACUTE ONLY) 25 min   Received in bed having pain in LLE but agreeable to sit in chair. Pt able to get EOB and SPT w/ minA and no faults. Left w/ call bell in reach and chair alarm on.  Pre Mobility: HR, BP, SpO2 During Mobility: HR, BP, SpO2 Post Mobility: HR, BP, SpO2  Frederico Hamman Mobility Specialist Please contact via Special educational needs teacher or  Rehab office at [phone]

## 2022-08-02 NOTE — Progress Notes (Signed)
DISCHARGE NOTE HOME REBA FUDA to be discharged Home per MD order. Discussed prescriptions and follow up appointments with the patient. Prescriptions given to patient; medication list explained in detail. Patient verbalized understanding.  Skin clean, dry and intact without evidence of skin break down, no evidence of skin tears noted. IV catheter discontinued intact. Site without signs and symptoms of complications. Dressing and pressure applied. Pt denies pain at the site currently. No complaints noted.  Patient free of lines, drains, and wounds.   An After Visit Summary (AVS) was printed and given to the patient. Patient escorted via wheelchair, and discharged home via [facility].  Lorine Bears, RN

## 2022-08-02 NOTE — ED Notes (Signed)
Pt D/C [facility] here to transport. Spoke with both pt's daughters Sheryle Hail & Abran Duke). Sheryle Hail stated that pt to be dropped off to her home tonight & in the morning Abran Duke will pick him up. Abran Duke stated pt is going to [facility] home. Pt stating he can't go without the O2 tank ordered for him. O2 sat on room air at time of transport 97%. Spoke with case Management Burna Mortimer), instructed to have [facility] transport pt & leave ordered O2 tank which she later collected. Charge RN [employer] updated.

## 2022-08-02 NOTE — ED Triage Notes (Signed)
Pt to ED via [facility] from home. Pt was Dced from inpatient hospital today with [facility]. Pt was supposed to have home O2, however when [facility] arrived at pt's home, pt had no O2. Pt is on 2L [place] baseline. Pt endorses SOB on RA. Pt is AAOX4. Pt has no other complaints.   EMS Vitals: 99% 2L [place] 150/90 96 HR

## 2022-08-02 NOTE — TOC Transition Note (Signed)
Transition of Care [facility]) - CM/SW Discharge Note   Patient Details  Name: [name] MRN: [id number] Date of Birth: 1955-11-07  [facility]) CM/SW Contact:  Tom-Johnson, Hershal Coria, RN Phone Number: 08/02/2022, 12:30 PM   Clinical Narrative:     Patient is scheduled for discharge today.  Readmission Risk Assessment done. Outpatient referral, hospital f/u and discharge instructions on AVS. [facility] scheduled to transport at discharge.  No further TOC needs noted.       Final next level of care: Home w Home Health Services Barriers to Discharge: Barriers Resolved   Patient Goals and CMS Choice CMS Medicare.gov Compare Post Acute Care list provided to:: Patient Choice offered to / list presented to : Patient  Discharge Placement                  Patient to be transferred to facility by: [facility]      Discharge Plan and Services Additional resources added to the After Visit Summary for                  DME Arranged: N/A DME Agency: NA       HH Arranged: PT, OT, RN, Nurse's Aide HH Agency: [facility] Date [facility] Agency Contacted: 07/30/22 Time HH Agency Contacted: 1042 Representative spoke with at [facility] Agency: Cindie  Social Determinants of Health (SDOH) Interventions SDOH Screenings   Food Insecurity: No Food Insecurity (07/26/2022)  Housing: Low Risk  (07/26/2022)  Transportation Needs: No Transportation Needs (07/26/2022)  Utilities: Not At Risk (07/26/2022)  Depression (PHQ2-9): Low Risk  (07/22/2019)  Tobacco Use: High Risk (07/26/2022)     Readmission Risk Interventions    08/02/2022   12:27 PM  Readmission Risk Prevention Plan  Post Dischage Appt Complete  Medication Screening Complete  Transportation Screening Complete

## 2022-08-22 ENCOUNTER — Other Ambulatory Visit: Payer: Self-pay

## 2022-08-22 ENCOUNTER — Emergency Department (HOSPITAL_COMMUNITY): Payer: 59

## 2022-08-22 ENCOUNTER — Emergency Department (HOSPITAL_COMMUNITY)
Admission: EM | Admit: 2022-08-22 | Discharge: 2022-08-23 | Disposition: A | Discharge status: Home / Self Care | Payer: 59 | Source: Home / Self Care | Attending: Emergency Medicine | Admitting: Emergency Medicine

## 2022-08-22 ENCOUNTER — Encounter (HOSPITAL_COMMUNITY): Payer: Self-pay

## 2022-08-22 DIAGNOSIS — R0602 Shortness of breath: Secondary | ICD-10-CM | POA: Insufficient documentation

## 2022-08-22 DIAGNOSIS — I10 Essential (primary) hypertension: Secondary | ICD-10-CM | POA: Diagnosis not present

## 2022-08-22 DIAGNOSIS — R000 Tachycardia, unspecified: Secondary | ICD-10-CM | POA: Insufficient documentation

## 2022-08-22 DIAGNOSIS — J45909 Unspecified asthma, uncomplicated: Secondary | ICD-10-CM | POA: Diagnosis not present

## 2022-08-22 DIAGNOSIS — Z1152 Encounter for screening for COVID-19: Secondary | ICD-10-CM | POA: Diagnosis not present

## 2022-08-22 DIAGNOSIS — R059 Cough, unspecified: Secondary | ICD-10-CM | POA: Diagnosis not present

## 2022-08-22 DIAGNOSIS — J449 Chronic obstructive pulmonary disease, unspecified: Secondary | ICD-10-CM | POA: Diagnosis not present

## 2022-08-22 DIAGNOSIS — Z7951 Long term (current) use of inhaled steroids: Secondary | ICD-10-CM | POA: Insufficient documentation

## 2022-08-22 DIAGNOSIS — Z79899 Other long term (current) drug therapy: Secondary | ICD-10-CM | POA: Insufficient documentation

## 2022-08-22 DIAGNOSIS — R0600 Dyspnea, unspecified: Secondary | ICD-10-CM

## 2022-08-22 LAB — CBC WITH DIFFERENTIAL/PLATELET
Abs Immature Granulocytes: 0.04 10*3/uL (ref 0.00–0.07)
Basophils Absolute: 0 10*3/uL (ref 0.0–0.1)
Basophils Relative: 0 %
Eosinophils Absolute: 0.1 10*3/uL (ref 0.0–0.5)
Eosinophils Relative: 1 %
HCT: 37 % — ABNORMAL LOW (ref 39.0–52.0)
Hemoglobin: 11.1 g/dL — ABNORMAL LOW (ref 13.0–17.0)
Immature Granulocytes: 1 %
Lymphocytes Relative: 5 %
Lymphs Abs: 0.4 10*3/uL — ABNORMAL LOW (ref 0.7–4.0)
MCH: 29.7 pg (ref 26.0–34.0)
MCHC: 30 g/dL (ref 30.0–36.0)
MCV: 98.9 fL (ref 80.0–100.0)
Monocytes Absolute: 0.3 10*3/uL (ref 0.1–1.0)
Monocytes Relative: 4 %
Neutro Abs: 6.9 10*3/uL (ref 1.7–7.7)
Neutrophils Relative %: 89 %
Platelets: 178 10*3/uL (ref 150–400)
RBC: 3.74 MIL/uL — ABNORMAL LOW (ref 4.22–5.81)
RDW: 16.5 % — ABNORMAL HIGH (ref 11.5–15.5)
WBC: 7.8 10*3/uL (ref 4.0–10.5)
nRBC: 0 % (ref 0.0–0.2)

## 2022-08-22 LAB — BASIC METABOLIC PANEL
Anion gap: 10 (ref 5–15)
BUN: 7 mg/dL — ABNORMAL LOW (ref 8–23)
CO2: 32 mmol/L (ref 22–32)
Calcium: 8.9 mg/dL (ref 8.9–10.3)
Chloride: 93 mmol/L — ABNORMAL LOW (ref 98–111)
Creatinine, Ser: 0.51 mg/dL — ABNORMAL LOW (ref 0.61–1.24)
GFR, Estimated: >60 mL/min (ref >60)
Glucose, Bld: 106 mg/dL — ABNORMAL HIGH (ref 70–99)
Potassium: 4.2 mmol/L (ref 3.5–5.1)
Sodium: 135 mmol/L (ref 135–145)

## 2022-08-22 LAB — SARS CORONAVIRUS 2 BY RT PCR: SARS Coronavirus 2 by RT PCR: NEGATIVE

## 2022-08-22 MED ORDER — BUDESONIDE 0.5 MG/2ML IN SUSP
0.5000 mg | Freq: Two times a day (BID) | RESPIRATORY_TRACT | Status: DC
  Administered 2022-08-22 – 2022-08-23 (×2): 0.5 mg via RESPIRATORY_TRACT
  Filled 2022-08-22 (×2): qty 2

## 2022-08-22 MED ORDER — IPRATROPIUM-ALBUTEROL 0.5-2.5 (3) MG/3ML IN SOLN
3.0000 mL | Freq: Four times a day (QID) | RESPIRATORY_TRACT | Status: DC
  Administered 2022-08-22 – 2022-08-23 (×4): 3 mL via RESPIRATORY_TRACT
  Filled 2022-08-22 (×4): qty 3

## 2022-08-22 MED ORDER — ZINC SULFATE 220 (50 ZN) MG PO CAPS
220.0000 mg | ORAL_CAPSULE | Freq: Every day | ORAL | Status: DC
  Administered 2022-08-23: 220 mg via ORAL
  Filled 2022-08-22: qty 1

## 2022-08-22 MED ORDER — GUAIFENESIN ER 600 MG PO TB12
600.0000 mg | ORAL_TABLET | Freq: Two times a day (BID) | ORAL | Status: DC
  Administered 2022-08-22 – 2022-08-23 (×2): 600 mg via ORAL
  Filled 2022-08-22 (×2): qty 1

## 2022-08-22 MED ORDER — LISINOPRIL 20 MG PO TABS
20.0000 mg | ORAL_TABLET | Freq: Every day | ORAL | Status: DC
  Administered 2022-08-23: 20 mg via ORAL
  Filled 2022-08-22: qty 1

## 2022-08-22 MED ORDER — ALBUTEROL SULFATE (2.5 MG/3ML) 0.083% IN NEBU: 2.5000 mg | INHALATION_SOLUTION | Freq: Four times a day (QID) | RESPIRATORY_TRACT | Status: DC | PRN

## 2022-08-22 NOTE — ED Provider Notes (Signed)
Patient seen after prior EDP.  Social work reports that oxygen tanks will not be delivered to the patient's home until tomorrow.  Patient can board in the ED overnight until tomorrow morning.  Patient will be appropriate for discharge home as soon as oxygen supplies delivered to his house.   Wynetta Fines, MD 08/22/22 [phone]
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
## 2022-08-22 NOTE — Care Management (Signed)
Spoke with [employer] DME rep they will not be able to deliver portable tanks to his home until in the am. Patient will remain in the ED until oxygen is delivered. Updated EDP, RN ED staff,  Updated ED TOC daytime staff to follow up in the am.
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
## 2022-08-22 NOTE — ED Triage Notes (Signed)
Pt bib ems from home c/o increased sob starting last night. Pt was d/c 3 weeks ago with dx PNA and sent home with abx. Pt chart states he should be on home 2L oxygen but pt decline wearing at home.   BP 140/85 RR 30-44 HR 102 CBG 255

## 2022-08-22 NOTE — Discharge Instructions (Signed)
Return for any problem.  ?

## 2022-08-23 DIAGNOSIS — R0602 Shortness of breath: Secondary | ICD-10-CM | POA: Diagnosis not present

## 2022-08-23 NOTE — ED Notes (Signed)
Oxygen delivery not available until morning.  Per CSW Burna Mortimer pt will need to board here until morning.
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
## 2022-09-14 ENCOUNTER — Encounter (HOSPITAL_COMMUNITY): Payer: Self-pay

## 2022-09-14 ENCOUNTER — Emergency Department (HOSPITAL_COMMUNITY)
Admission: EM | Admit: 2022-09-14 | Discharge: 2022-09-15 | Disposition: A | Discharge status: Home / Self Care | Payer: 59 | Attending: Emergency Medicine | Admitting: Emergency Medicine

## 2022-09-14 ENCOUNTER — Other Ambulatory Visit: Payer: Self-pay

## 2022-09-14 ENCOUNTER — Emergency Department (HOSPITAL_COMMUNITY): Payer: 59

## 2022-09-14 DIAGNOSIS — R0602 Shortness of breath: Secondary | ICD-10-CM | POA: Diagnosis present

## 2022-09-14 DIAGNOSIS — F17200 Nicotine dependence, unspecified, uncomplicated: Secondary | ICD-10-CM | POA: Diagnosis not present

## 2022-09-14 DIAGNOSIS — J45901 Unspecified asthma with (acute) exacerbation: Secondary | ICD-10-CM

## 2022-09-14 DIAGNOSIS — J441 Chronic obstructive pulmonary disease with (acute) exacerbation: Secondary | ICD-10-CM | POA: Diagnosis not present

## 2022-09-14 LAB — CBC
HCT: 38.6 % — ABNORMAL LOW (ref 39.0–52.0)
Hemoglobin: 11.1 g/dL — ABNORMAL LOW (ref 13.0–17.0)
MCH: 28.2 pg (ref 26.0–34.0)
MCHC: 28.8 g/dL — ABNORMAL LOW (ref 30.0–36.0)
MCV: 98 fL (ref 80.0–100.0)
Platelets: 167 10*3/uL (ref 150–400)
RBC: 3.94 MIL/uL — ABNORMAL LOW (ref 4.22–5.81)
RDW: 14.6 % (ref 11.5–15.5)
WBC: 5.9 10*3/uL (ref 4.0–10.5)
nRBC: 0 % (ref 0.0–0.2)

## 2022-09-14 LAB — BASIC METABOLIC PANEL
Anion gap: 10 (ref 5–15)
BUN: 5 mg/dL — ABNORMAL LOW (ref 8–23)
CO2: 38 mmol/L — ABNORMAL HIGH (ref 22–32)
Calcium: 9.4 mg/dL (ref 8.9–10.3)
Chloride: 92 mmol/L — ABNORMAL LOW (ref 98–111)
Creatinine, Ser: 0.53 mg/dL — ABNORMAL LOW (ref 0.61–1.24)
GFR, Estimated: >60 mL/min (ref >60)
Glucose, Bld: 134 mg/dL — ABNORMAL HIGH (ref 70–99)
Potassium: 4.1 mmol/L (ref 3.5–5.1)
Sodium: 140 mmol/L (ref 135–145)

## 2022-09-14 MED ORDER — METHYLPREDNISOLONE SODIUM SUCC 125 MG IJ SOLR
125.0000 mg | Freq: Once | INTRAMUSCULAR | Status: AC
  Administered 2022-09-14: 125 mg via INTRAVENOUS
  Filled 2022-09-14: qty 2

## 2022-09-14 MED ORDER — ALBUTEROL SULFATE (2.5 MG/3ML) 0.083% IN NEBU
10.0000 mg/h | INHALATION_SOLUTION | Freq: Once | RESPIRATORY_TRACT | Status: AC
  Administered 2022-09-15: 10 mg/h via RESPIRATORY_TRACT
  Filled 2022-09-14: qty 3
  Filled 2022-09-14: qty 12

## 2022-09-14 NOTE — ED Provider Notes (Signed)
[place] EMERGENCY DEPARTMENT AT [facility] Provider Note   CSN: [id number] Arrival date & time: 09/14/22  2127     History  Chief Complaint  Patient presents with   Shortness of Breath    [name] is a 67 y.o. male.  Patient complains of exacerbation of COPD/asthma.  Patient reports that he lost his inhaler and had an asthma attack.  She reports he is feeling some better after neb treatments.  Patient denies any fever or chills he has had a cough.  Patient is a smoker.  Patient reports he is normally on 2 L of oxygen at home.  Patient reports that he has recently increased his oxygen to 3 L.  Patient reports that he has had similar episodes in the past.  Patient reports he thinks that his exacerbation today is due to the heat and being out of albuterol  The history is provided by the patient and the EMS personnel. No language interpreter was used.  Shortness of Breath Severity:  Severe Onset quality:  Sudden Duration:  1 day Timing:  Constant Chronicity:  New Relieved by:  Nothing Worsened by:  Nothing Ineffective treatments:  None tried Associated symptoms: wheezing        Home Medications Prior to Admission medications   Medication Sig Start Date End Date Taking? Authorizing Provider  albuterol (PROVENTIL) (2.5 MG/3ML) 0.083% nebulizer solution Take 2.5 mg by nebulization every 6 (six) hours as needed for wheezing or shortness of breath.    [provider]  albuterol (VENTOLIN HFA) 108 (90 Base) MCG/ACT inhaler Inhale 2 puffs into the lungs every 4 (four) hours as needed for wheezing or shortness of breath. Patient not taking: Reported on 07/27/2022 06/09/20   Medina-Vargas, Monina C, NP  budesonide (PULMICORT) 0.5 MG/2ML nebulizer solution Take 2 mLs (0.5 mg total) by nebulization 2 (two) times daily. 08/02/22   Jonah Blue, MD  diclofenac (VOLTAREN) 75 MG EC tablet Take 75 mg by mouth 2 (two) times daily as needed. 02/03/22   [provider]  guaiFENesin (MUCINEX) 600 MG 12 hr tablet Take 1 tablet (600 mg total) by mouth 2 (two) times daily. 08/02/22   Jonah Blue, MD  ipratropium-albuterol (DUONEB) 0.5-2.5 (3) MG/3ML SOLN Take 3 mLs by nebulization 4 (four) times daily. 08/02/22   Jonah Blue, MD  lisinopril (ZESTRIL) 20 MG tablet Take 1 tablet (20 mg total) by mouth daily with breakfast. 06/09/20   Medina-Vargas, Monina C, NP  nicotine (NICODERM CQ - DOSED IN MG/24 HOURS) 21 mg/24hr patch Place 1 patch (21 mg total) onto the skin daily. 06/09/20   Medina-Vargas, Monina C, NP  OXYGEN Inhale 1-3 L into the lungs See admin instructions. Use overnight and when resting in bed    [provider]  tiZANidine (ZANAFLEX) 4 MG tablet Take 1 tablet (4 mg total) by mouth every morning. 06/09/20   Medina-Vargas, Monina C, NP  zinc sulfate 220 (50 Zn) MG capsule Take 1 capsule (220 mg total) by mouth daily. 06/09/20   Medina-Vargas, Monina C, NP      Allergies    Patient has no known allergies.    Review of Systems   Review of Systems  Respiratory:  Positive for shortness of breath and wheezing.   All other systems reviewed and are negative.   Physical Exam Updated Vital Signs BP (!) 165/99   Pulse 86   Temp 98.7 F (37.1 C) (Oral)   Resp (!) 22   SpO2 100%  Physical Exam Vitals and nursing note reviewed.  Constitutional:      Appearance: He is well-developed.  HENT:     Head: Normocephalic.  Cardiovascular:     Rate and Rhythm: Normal rate and regular rhythm.  Pulmonary:     Effort: Pulmonary effort is normal.     Breath sounds: Decreased breath sounds present.  Chest:     Chest wall: No tenderness.  Abdominal:     General: There is no distension.     Palpations: Abdomen is soft.  Musculoskeletal:        General: Normal range of motion.     Cervical back: Normal range of motion.  Skin:    General: Skin is warm.  Neurological:     General: No focal deficit present.     Mental Status: He is  alert and oriented to person, place, and time.  Psychiatric:        Mood and Affect: Mood normal.     ED Results / Procedures / Treatments   Labs (all labs ordered are listed, but only abnormal results are displayed) Labs Reviewed  BASIC METABOLIC PANEL  CBC    EKG None  Radiology No results found.  Procedures Procedures    Medications Ordered in ED Medications  methylPREDNISolone sodium succinate (SOLU-MEDROL) 125 mg/2 mL injection 125 mg (has no administration in time range)    ED Course/ Medical Decision Making/ A&P                                 Medical Decision Making Patient complains of increasing shortness of breath.  Patient reports he recently has increased his oxygen from 2 L to 3 L due to shortness of breath.  Patient reports he went for a walk and did not have his albuterol inhaler patient reports becoming really short of breath.  Patient was given nebulizer by EMS.  Amount and/or Complexity of Data Reviewed Labs: ordered. Decision-making details documented in ED Course.    Details: Labs ordered reviewed and interpreted.  Hemoglobin is 11.1 Radiology: ordered and independent interpretation performed. Decision-making details documented in ED Course.  Risk Prescription drug management.           Final Clinical Impression(s) / ED Diagnoses Final diagnoses:  COPD exacerbation (HCC)  Moderate asthma with exacerbation, unspecified whether persistent    Rx / DC Orders ED Discharge Orders     None      Pt's care turned over to [facility]    [place], [place] 09/15/22 0004    Glyn Ade, MD 09/15/22 [phone]

## 2022-09-14 NOTE — ED Triage Notes (Signed)
Pt is coming in with asthma exacerbation that started 1hr ago, mentions that hot air can cause his exacerbations. He did not have his inhaler with him and started to have difficulty breathing. Pt had audible wheezing with fire/medic arrival. Fire gave him 5mg  of albuterol neb as well as medic giving another 5mg  albuterol neb. Pt is currently receiving the last neb with some mild wheezing and diminished breath sound.   Medic Vitals   100% on neb treatment  85hr 152/92 20rr 24rr

## 2022-09-15 DIAGNOSIS — J441 Chronic obstructive pulmonary disease with (acute) exacerbation: Secondary | ICD-10-CM | POA: Diagnosis not present

## 2022-09-15 MED ORDER — PREDNISONE 10 MG (21) PO TBPK: ORAL_TABLET | Freq: Every day | ORAL | 0 refills | Status: DC

## 2022-09-15 MED ORDER — ALBUTEROL SULFATE HFA 108 (90 BASE) MCG/ACT IN AERS
2.0000 | INHALATION_SPRAY | Freq: Once | RESPIRATORY_TRACT | Status: AC
  Administered 2022-09-15: 2 via RESPIRATORY_TRACT
  Filled 2022-09-15: qty 6.7

## 2022-09-15 MED ORDER — ALBUTEROL SULFATE HFA 108 (90 BASE) MCG/ACT IN AERS: 1.0000 | INHALATION_SPRAY | Freq: Four times a day (QID) | RESPIRATORY_TRACT | 0 refills | Status: DC | PRN

## 2022-09-15 NOTE — ED Notes (Signed)
[facility] called. No ETA. Patient is 3rd in line.

## 2022-09-15 NOTE — ED Notes (Signed)
Pt transported home via [facility]. Pt verbalized understanding of plan of care at time of DC. Pt verbalized that family member is at house to let him in at time of DC. Pt a&ox4,vss,nad.

## 2022-09-15 NOTE — ED Provider Notes (Signed)
  Physical Exam  BP (!) 141/78   Pulse 75   Temp 98.8 F (37.1 C) (Axillary)   Resp (!) 24   SpO2 100%   Physical Exam  Procedures  Procedures  ED Course / MDM   Clinical Course as of 09/15/22 0449  Thu Sep 15, 2022  0320 O2 Device: Other (Comment) [RS]    Clinical Course User Index [RS] Chameka Mcmullen, Eugene Gavia, PA-C   Medical Decision Making Amount and/or Complexity of Data Reviewed Labs: ordered. Radiology: ordered.  Risk Prescription drug management.    Care of this patient assumed from preceding ED provider Langston Masker, PA-C at time of shift change.  Please see her associated note for further insight and the patient's ED course.  In brief patient is a 67 year old male who presents with concern for progressive worsening shortness of breath in context of COPD.  Requires 2 L at baseline, was requiring 6 L supplemental oxygen by nasal cannula at time of arrival due to work of breathing.  Now requiring 3 L.  Time of shift change pending administration of continuous albuterol neb x 1 hour.  Will require reevaluation.  Disposition pending reeval.  Reevaluated after administration of continuous albuterol treatment x 1 hour.  Patient is moving air well throughout the lung fields bilaterally, minimal wheezing residual in the bases bilaterally.  Oxygen saturation 99% on patient's baseline 2 L supplemental oxygen by nasal cannula.  Heart rate normal.  Patient well-appearing, tolerating p.o. and states he feels safe to be discharged home.  Clinical picture most consistent with acute COPD exacerbation.  Will discharge with steroid taper recommend close outpatient follow-up.  Refill for patient's albuterol inhaler provided.  No further workup warranted in ER at this time.  Clinical concern for emergent underlying etiology that would warrant further ED workup or inpatient management is exceedingly low.  Soul voiced understanding of his medical evaluation and treatment plan. Each of their  questions answered to their expressed satisfaction.  Return precautions were given.  Patient is well-appearing, stable, and was discharged in good condition.  This chart was dictated using voice recognition software, Dragon. Despite the best efforts of this provider to proofread and correct errors, errors may still occur which can change documentation meaning.       Paris Lore, PA-C 09/15/22 0449    Glynn Octave, MD 09/15/22 [phone]
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
## 2022-09-15 NOTE — Discharge Instructions (Addendum)
You are seen the ER today for your COPD exacerbation.  You were given breathing treatments in the emergency department with improvement in your symptoms.  Please take the prescribed steroid for entire course.  Follow-up with your primary care doctor return to the ER with any severe symptoms.

## 2022-09-15 NOTE — ED Notes (Signed)
RN Spoke with pt daughter Abran Duke who is trying to get in touch with the family member whom pt resides with to see if there will be anyone home so RN can set up transportation back home for pt. Per daughter Abran Duke, still unable to get a hold of family member and will call when she has any updates. Charge RN made aware.

## 2022-09-27 ENCOUNTER — Encounter (HOSPITAL_COMMUNITY): Payer: Self-pay

## 2022-09-27 ENCOUNTER — Emergency Department (HOSPITAL_COMMUNITY)
Admission: EM | Admit: 2022-09-27 | Discharge: 2022-09-28 | Disposition: A | Discharge status: Home / Self Care | Payer: 59 | Source: Home / Self Care | Attending: Emergency Medicine | Admitting: Emergency Medicine

## 2022-09-27 DIAGNOSIS — Z7951 Long term (current) use of inhaled steroids: Secondary | ICD-10-CM | POA: Insufficient documentation

## 2022-09-27 DIAGNOSIS — R0602 Shortness of breath: Secondary | ICD-10-CM | POA: Insufficient documentation

## 2022-09-27 DIAGNOSIS — R059 Cough, unspecified: Secondary | ICD-10-CM | POA: Insufficient documentation

## 2022-09-27 DIAGNOSIS — Z79899 Other long term (current) drug therapy: Secondary | ICD-10-CM | POA: Diagnosis not present

## 2022-09-27 DIAGNOSIS — J449 Chronic obstructive pulmonary disease, unspecified: Secondary | ICD-10-CM | POA: Insufficient documentation

## 2022-09-27 MED ORDER — BUDESONIDE 0.5 MG/2ML IN SUSP
0.5000 mg | Freq: Two times a day (BID) | RESPIRATORY_TRACT | Status: DC
  Administered 2022-09-27 (×2): 0.5 mg via RESPIRATORY_TRACT
  Filled 2022-09-27 (×2): qty 2

## 2022-09-27 MED ORDER — NICOTINE 21 MG/24HR TD PT24: 21.0000 mg | MEDICATED_PATCH | Freq: Every day | TRANSDERMAL | Status: DC

## 2022-09-27 MED ORDER — LISINOPRIL 20 MG PO TABS: 20.0000 mg | ORAL_TABLET | Freq: Every day | ORAL | Status: DC

## 2022-09-27 MED ORDER — ALBUTEROL SULFATE HFA 108 (90 BASE) MCG/ACT IN AERS
2.0000 | INHALATION_SPRAY | Freq: Once | RESPIRATORY_TRACT | Status: AC
  Administered 2022-09-27: 2 via RESPIRATORY_TRACT
  Filled 2022-09-27: qty 6.7

## 2022-09-27 MED ORDER — TIZANIDINE HCL 4 MG PO TABS: 4.0000 mg | ORAL_TABLET | Freq: Every morning | ORAL | Status: DC

## 2022-09-27 MED ORDER — ALBUTEROL SULFATE HFA 108 (90 BASE) MCG/ACT IN AERS: 1.0000 | INHALATION_SPRAY | Freq: Four times a day (QID) | RESPIRATORY_TRACT | Status: DC | PRN

## 2022-09-27 MED ORDER — ALBUTEROL SULFATE (2.5 MG/3ML) 0.083% IN NEBU: 2.5000 mg | INHALATION_SOLUTION | Freq: Four times a day (QID) | RESPIRATORY_TRACT | Status: DC | PRN
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
## 2022-09-27 NOTE — ED Notes (Signed)
Ptar called pt to aof
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
## 2022-09-27 NOTE — ED Notes (Signed)
RN changed pt O2 tank

## 2022-09-27 NOTE — ED Notes (Signed)
RN updated daughter, Evonnie Pat

## 2022-09-27 NOTE — Discharge Instructions (Addendum)
Please return for new concerning symptoms. Follow up with your primary care provider if you have concern about home oxygen availability.

## 2022-09-28 NOTE — ED Notes (Signed)
[facility] arrived to pick up pt, pt daughter made aware.

## 2022-10-14 ENCOUNTER — Other Ambulatory Visit: Payer: Self-pay | Admitting: Internal Medicine

## 2022-10-15 LAB — COMPLETE METABOLIC PANEL WITH GFR
AG Ratio: 1.7 (calc) (ref 1.0–2.5)
ALT: 7 U/L — ABNORMAL LOW (ref 9–46)
AST: 10 U/L (ref 10–35)
Albumin: 4.1 g/dL (ref 3.6–5.1)
Alkaline phosphatase (APISO): 67 U/L (ref 35–144)
BUN/Creatinine Ratio: 26 (calc) — ABNORMAL HIGH (ref 6–22)
BUN: 11 mg/dL (ref 7–25)
CO2: 32 mmol/L (ref 20–32)
Calcium: 9.8 mg/dL (ref 8.6–10.3)
Chloride: 94 mmol/L — ABNORMAL LOW (ref 98–110)
Creat: 0.43 mg/dL — ABNORMAL LOW (ref 0.70–1.35)
Globulin: 2.4 g/dL (ref 1.9–3.7)
Glucose, Bld: 99 mg/dL (ref 65–99)
Potassium: 4.3 mmol/L (ref 3.5–5.3)
Sodium: 138 mmol/L (ref 135–146)
Total Bilirubin: 0.2 mg/dL (ref 0.2–1.2)
Total Protein: 6.5 g/dL (ref 6.1–8.1)
eGFR: 117 mL/min/{1.73_m2} (ref >=60)

## 2022-10-15 LAB — CBC
HCT: 37.9 % — ABNORMAL LOW (ref 38.5–50.0)
Hemoglobin: 11.9 g/dL — ABNORMAL LOW (ref 13.2–17.1)
MCH: 28.8 pg (ref 27.0–33.0)
MCHC: 31.4 g/dL — ABNORMAL LOW (ref 32.0–36.0)
MCV: 91.8 fL (ref 80.0–100.0)
MPV: 11 fL (ref 7.5–12.5)
Platelets: 161 10*3/uL (ref 140–400)
RBC: 4.13 10*6/uL — ABNORMAL LOW (ref 4.20–5.80)
RDW: 14 % (ref 11.0–15.0)
WBC: 7 10*3/uL (ref 3.8–10.8)

## 2022-10-15 LAB — LIPID PANEL
Cholesterol: 169 mg/dL (ref <200)
HDL: 60 mg/dL (ref >=40)
LDL Cholesterol (Calc): 94 mg/dL
Non-HDL Cholesterol (Calc): 109 mg/dL (ref <130)
Total CHOL/HDL Ratio: 2.8 (calc) (ref <5.0)
Triglycerides: 68 mg/dL (ref <150)

## 2022-10-15 LAB — VITAMIN D 25 HYDROXY (VIT D DEFICIENCY, FRACTURES): Vit D, 25-Hydroxy: 28 ng/mL — ABNORMAL LOW (ref 30–100)

## 2022-10-15 LAB — PSA: PSA: 0.85 ng/mL (ref <=4.00)

## 2022-10-15 LAB — TSH: TSH: 1.77 m[IU]/L (ref 0.40–4.50)

## 2022-11-20 ENCOUNTER — Emergency Department (HOSPITAL_COMMUNITY)
Admission: EM | Admit: 2022-11-20 | Discharge: 2022-11-21 | Disposition: A | Discharge status: Home / Self Care | Payer: 59 | Attending: Emergency Medicine | Admitting: Emergency Medicine

## 2022-11-20 ENCOUNTER — Encounter (HOSPITAL_COMMUNITY): Payer: Self-pay

## 2022-11-20 ENCOUNTER — Other Ambulatory Visit: Payer: Self-pay

## 2022-11-20 ENCOUNTER — Emergency Department (HOSPITAL_COMMUNITY): Payer: 59

## 2022-11-20 DIAGNOSIS — Z1152 Encounter for screening for COVID-19: Secondary | ICD-10-CM | POA: Diagnosis not present

## 2022-11-20 DIAGNOSIS — J441 Chronic obstructive pulmonary disease with (acute) exacerbation: Secondary | ICD-10-CM | POA: Insufficient documentation

## 2022-11-20 DIAGNOSIS — R0602 Shortness of breath: Secondary | ICD-10-CM | POA: Diagnosis present

## 2022-11-20 DIAGNOSIS — I10 Essential (primary) hypertension: Secondary | ICD-10-CM | POA: Diagnosis not present

## 2022-11-20 DIAGNOSIS — J189 Pneumonia, unspecified organism: Secondary | ICD-10-CM

## 2022-11-20 LAB — BASIC METABOLIC PANEL
Anion gap: 12 (ref 5–15)
BUN: 7 mg/dL — ABNORMAL LOW (ref 8–23)
CO2: 39 mmol/L — ABNORMAL HIGH (ref 22–32)
Calcium: 9 mg/dL (ref 8.9–10.3)
Chloride: 89 mmol/L — ABNORMAL LOW (ref 98–111)
Creatinine, Ser: 0.47 mg/dL — ABNORMAL LOW (ref 0.61–1.24)
GFR, Estimated: >60 mL/min (ref >60)
Glucose, Bld: 133 mg/dL — ABNORMAL HIGH (ref 70–99)
Potassium: 3.7 mmol/L (ref 3.5–5.1)
Sodium: 140 mmol/L (ref 135–145)

## 2022-11-20 LAB — CBC WITH DIFFERENTIAL/PLATELET
Abs Immature Granulocytes: 0.04 10*3/uL (ref 0.00–0.07)
Basophils Absolute: 0 10*3/uL (ref 0.0–0.1)
Basophils Relative: 1 %
Eosinophils Absolute: 0.1 10*3/uL (ref 0.0–0.5)
Eosinophils Relative: 1 %
HCT: 40.8 % (ref 39.0–52.0)
Hemoglobin: 12 g/dL — ABNORMAL LOW (ref 13.0–17.0)
Immature Granulocytes: 1 %
Lymphocytes Relative: 14 %
Lymphs Abs: 1.1 10*3/uL (ref 0.7–4.0)
MCH: 29.3 pg (ref 26.0–34.0)
MCHC: 29.4 g/dL — ABNORMAL LOW (ref 30.0–36.0)
MCV: 99.8 fL (ref 80.0–100.0)
Monocytes Absolute: 0.6 10*3/uL (ref 0.1–1.0)
Monocytes Relative: 8 %
Neutro Abs: 5.8 10*3/uL (ref 1.7–7.7)
Neutrophils Relative %: 75 %
Platelets: 208 10*3/uL (ref 150–400)
RBC: 4.09 MIL/uL — ABNORMAL LOW (ref 4.22–5.81)
RDW: 14.7 % (ref 11.5–15.5)
WBC: 7.7 10*3/uL (ref 4.0–10.5)
nRBC: 0 % (ref 0.0–0.2)

## 2022-11-20 LAB — SARS CORONAVIRUS 2 BY RT PCR: SARS Coronavirus 2 by RT PCR: NEGATIVE

## 2022-11-20 MED ORDER — AZITHROMYCIN 250 MG PO TABS
500.0000 mg | ORAL_TABLET | Freq: Once | ORAL | Status: AC
  Administered 2022-11-20: 500 mg via ORAL
  Filled 2022-11-20: qty 2

## 2022-11-20 MED ORDER — LISINOPRIL 20 MG PO TABS: 20.0000 mg | ORAL_TABLET | Freq: Every day | ORAL | Status: DC

## 2022-11-20 MED ORDER — ALBUTEROL SULFATE (2.5 MG/3ML) 0.083% IN NEBU
2.5000 mg | INHALATION_SOLUTION | Freq: Once | RESPIRATORY_TRACT | Status: AC
  Administered 2022-11-20: 2.5 mg via RESPIRATORY_TRACT
  Filled 2022-11-20: qty 3

## 2022-11-20 MED ORDER — PREDNISONE 20 MG PO TABS: 40.0000 mg | ORAL_TABLET | Freq: Every day | ORAL | Status: DC

## 2022-11-20 MED ORDER — SODIUM CHLORIDE 0.9 % IV SOLN
1.0000 g | Freq: Once | INTRAVENOUS | Status: AC
  Administered 2022-11-20: 1 g via INTRAVENOUS
  Filled 2022-11-20: qty 10

## 2022-11-20 MED ORDER — GUAIFENESIN ER 600 MG PO TB12
600.0000 mg | ORAL_TABLET | Freq: Two times a day (BID) | ORAL | Status: DC
  Administered 2022-11-20: 600 mg via ORAL
  Filled 2022-11-20: qty 1

## 2022-11-20 MED ORDER — PREDNISONE 10 MG PO TABS: 40.0000 mg | ORAL_TABLET | Freq: Every day | ORAL | 0 refills | Status: DC

## 2022-11-20 MED ORDER — IPRATROPIUM-ALBUTEROL 0.5-2.5 (3) MG/3ML IN SOLN: 3.0000 mL | RESPIRATORY_TRACT | Status: DC | PRN

## 2022-11-20 MED ORDER — AZITHROMYCIN 250 MG PO TABS: 250.0000 mg | ORAL_TABLET | Freq: Every day | ORAL | Status: DC

## 2022-11-20 MED ORDER — ALBUTEROL SULFATE HFA 108 (90 BASE) MCG/ACT IN AERS
2.0000 | INHALATION_SPRAY | RESPIRATORY_TRACT | Status: DC | PRN
  Administered 2022-11-20: 2 via RESPIRATORY_TRACT
  Filled 2022-11-20: qty 6.7

## 2022-11-20 MED ORDER — IPRATROPIUM-ALBUTEROL 0.5-2.5 (3) MG/3ML IN SOLN
3.0000 mL | RESPIRATORY_TRACT | Status: DC
  Filled 2022-11-20: qty 9

## 2022-11-20 MED ORDER — IPRATROPIUM-ALBUTEROL 0.5-2.5 (3) MG/3ML IN SOLN
3.0000 mL | Freq: Once | RESPIRATORY_TRACT | Status: AC
  Administered 2022-11-20: 3 mL via RESPIRATORY_TRACT

## 2022-11-20 MED ORDER — PREDNISONE 20 MG PO TABS
60.0000 mg | ORAL_TABLET | Freq: Once | ORAL | Status: AC
  Administered 2022-11-20: 60 mg via ORAL
  Filled 2022-11-20: qty 3

## 2022-11-20 MED ORDER — ALBUTEROL SULFATE HFA 108 (90 BASE) MCG/ACT IN AERS: 2.0000 | INHALATION_SPRAY | RESPIRATORY_TRACT | 0 refills | Status: DC | PRN

## 2022-11-20 MED ORDER — BUDESONIDE 0.5 MG/2ML IN SUSP
0.5000 mg | Freq: Two times a day (BID) | RESPIRATORY_TRACT | Status: DC
  Administered 2022-11-20: 0.5 mg via RESPIRATORY_TRACT
  Filled 2022-11-20: qty 2

## 2022-11-20 MED ORDER — AMOXICILLIN-POT CLAVULANATE 875-125 MG PO TABS: 1.0000 | ORAL_TABLET | Freq: Two times a day (BID) | ORAL | 0 refills | Status: AC

## 2022-11-20 MED ORDER — AMOXICILLIN-POT CLAVULANATE 875-125 MG PO TABS
1.0000 | ORAL_TABLET | Freq: Two times a day (BID) | ORAL | Status: DC
  Administered 2022-11-20: 1 via ORAL
  Filled 2022-11-20: qty 1

## 2022-11-20 MED ORDER — AZITHROMYCIN 250 MG PO TABS: 250.0000 mg | ORAL_TABLET | Freq: Every day | ORAL | 0 refills | Status: DC

## 2022-11-20 NOTE — ED Notes (Signed)
Per case Estate agent

## 2022-11-20 NOTE — Care Management (Signed)
Spoke with Nursing regarding patients oxygen. Called and left message for Abran Duke as well as texted. She called back and said his oxygen provider is [facility]. He does have some tanks at home, however, they would run out within a few hours. [facility] was called they cannot come out today. Abran Duke said she will call when power is back on , the expected is 8pm, and then we can transport patient.
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
## 2022-11-20 NOTE — ED Notes (Signed)
New labs sent
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
## 2022-11-20 NOTE — ED Notes (Addendum)
Spoke with daughter on file, she states pt lives with her sister and that her sister will not speak to anyone from the hospital. This nurse informed [name] that it would be preferred  to speak to his caregiver directly. [name] again stated she would call the sister and that the sister would not speak to the hospital directly. Daughter to call back

## 2022-11-20 NOTE — Discharge Instructions (Signed)
You were seen in the emergency department for your shortness of breath and your cough.  You did have wheezing consistent with a COPD exacerbation and signs of a early pneumonia on your chest x-ray.  We gave you steroids, breathing treatments and antibiotics in the emergency department.  You should complete your steroid course and antibiotics as prescribed.  You can use your albuterol every 4 hours for the next 24 hours and then can use as needed.  You can follow-up with your primary doctor in the next few days to have your symptoms rechecked.  You should return to the emergency department for significantly worsening shortness of breath, severe chest pain, fevers despite the antibiotics or any other new or concerning symptoms.

## 2022-11-20 NOTE — ED Notes (Signed)
Patient transported to X-ray 
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
## 2022-11-21 NOTE — ED Notes (Signed)
Pt to be transported back home via [facility].

## 2023-03-29 ENCOUNTER — Inpatient Hospital Stay (HOSPITAL_COMMUNITY)
Admission: EM | Admit: 2023-03-29 | Discharge: 2023-04-04 | DRG: 564 | Disposition: A | Discharge status: Home Health | Payer: 59 | Attending: Internal Medicine | Admitting: Internal Medicine

## 2023-03-29 ENCOUNTER — Emergency Department (HOSPITAL_COMMUNITY): Payer: 59

## 2023-03-29 DIAGNOSIS — E785 Hyperlipidemia, unspecified: Secondary | ICD-10-CM | POA: Diagnosis present

## 2023-03-29 DIAGNOSIS — L89899 Pressure ulcer of other site, unspecified stage: Secondary | ICD-10-CM | POA: Diagnosis present

## 2023-03-29 DIAGNOSIS — J9621 Acute and chronic respiratory failure with hypoxia: Secondary | ICD-10-CM | POA: Diagnosis present

## 2023-03-29 DIAGNOSIS — Z79899 Other long term (current) drug therapy: Secondary | ICD-10-CM

## 2023-03-29 DIAGNOSIS — F1410 Cocaine abuse, uncomplicated: Secondary | ICD-10-CM | POA: Diagnosis present

## 2023-03-29 DIAGNOSIS — T8744 Infection of amputation stump, left lower extremity: Secondary | ICD-10-CM | POA: Diagnosis not present

## 2023-03-29 DIAGNOSIS — J9601 Acute respiratory failure with hypoxia: Secondary | ICD-10-CM | POA: Diagnosis present

## 2023-03-29 DIAGNOSIS — Z91148 Patient's other noncompliance with medication regimen for other reason: Secondary | ICD-10-CM

## 2023-03-29 DIAGNOSIS — F1010 Alcohol abuse, uncomplicated: Secondary | ICD-10-CM | POA: Diagnosis present

## 2023-03-29 DIAGNOSIS — Z635 Disruption of family by separation and divorce: Secondary | ICD-10-CM

## 2023-03-29 DIAGNOSIS — L97121 Non-pressure chronic ulcer of left thigh limited to breakdown of skin: Secondary | ICD-10-CM

## 2023-03-29 DIAGNOSIS — F17210 Nicotine dependence, cigarettes, uncomplicated: Secondary | ICD-10-CM | POA: Diagnosis present

## 2023-03-29 DIAGNOSIS — J9612 Chronic respiratory failure with hypercapnia: Secondary | ICD-10-CM | POA: Diagnosis not present

## 2023-03-29 DIAGNOSIS — Z7951 Long term (current) use of inhaled steroids: Secondary | ICD-10-CM

## 2023-03-29 DIAGNOSIS — L0390 Cellulitis, unspecified: Secondary | ICD-10-CM | POA: Diagnosis present

## 2023-03-29 DIAGNOSIS — Z96652 Presence of left artificial knee joint: Secondary | ICD-10-CM | POA: Diagnosis present

## 2023-03-29 DIAGNOSIS — Y835 Amputation of limb(s) as the cause of abnormal reaction of the patient, or of later complication, without mention of misadventure at the time of the procedure: Secondary | ICD-10-CM | POA: Diagnosis present

## 2023-03-29 DIAGNOSIS — E875 Hyperkalemia: Secondary | ICD-10-CM | POA: Diagnosis not present

## 2023-03-29 DIAGNOSIS — J441 Chronic obstructive pulmonary disease with (acute) exacerbation: Secondary | ICD-10-CM | POA: Diagnosis present

## 2023-03-29 DIAGNOSIS — I739 Peripheral vascular disease, unspecified: Secondary | ICD-10-CM | POA: Diagnosis present

## 2023-03-29 DIAGNOSIS — R443 Hallucinations, unspecified: Secondary | ICD-10-CM | POA: Diagnosis present

## 2023-03-29 DIAGNOSIS — Z7409 Other reduced mobility: Secondary | ICD-10-CM | POA: Diagnosis present

## 2023-03-29 DIAGNOSIS — Z7952 Long term (current) use of systemic steroids: Secondary | ICD-10-CM

## 2023-03-29 DIAGNOSIS — M79605 Pain in left leg: Principal | ICD-10-CM

## 2023-03-29 DIAGNOSIS — Z604 Social exclusion and rejection: Secondary | ICD-10-CM | POA: Diagnosis present

## 2023-03-29 DIAGNOSIS — Z716 Tobacco abuse counseling: Secondary | ICD-10-CM

## 2023-03-29 DIAGNOSIS — J9622 Acute and chronic respiratory failure with hypercapnia: Secondary | ICD-10-CM | POA: Diagnosis present

## 2023-03-29 DIAGNOSIS — G8929 Other chronic pain: Secondary | ICD-10-CM | POA: Diagnosis present

## 2023-03-29 DIAGNOSIS — E8729 Other acidosis: Secondary | ICD-10-CM | POA: Diagnosis present

## 2023-03-29 DIAGNOSIS — I10 Essential (primary) hypertension: Secondary | ICD-10-CM | POA: Diagnosis present

## 2023-03-29 LAB — BASIC METABOLIC PANEL
Anion gap: 19 — ABNORMAL HIGH (ref 5–15)
BUN: 23 mg/dL (ref 8–23)
CO2: 30 mmol/L (ref 22–32)
Calcium: 9 mg/dL (ref 8.9–10.3)
Chloride: 90 mmol/L — ABNORMAL LOW (ref 98–111)
Creatinine, Ser: 0.53 mg/dL — ABNORMAL LOW (ref 0.61–1.24)
GFR, Estimated: >60 mL/min (ref >60)
Glucose, Bld: 96 mg/dL (ref 70–99)
Potassium: 5.4 mmol/L — ABNORMAL HIGH (ref 3.5–5.1)
Sodium: 139 mmol/L (ref 135–145)

## 2023-03-29 LAB — CBC WITH DIFFERENTIAL/PLATELET
Abs Immature Granulocytes: 0.06 10*3/uL (ref 0.00–0.07)
Basophils Absolute: 0 10*3/uL (ref 0.0–0.1)
Basophils Relative: 0 %
Eosinophils Absolute: 0 10*3/uL (ref 0.0–0.5)
Eosinophils Relative: 0 %
HCT: 44.3 % (ref 39.0–52.0)
Hemoglobin: 13.3 g/dL (ref 13.0–17.0)
Immature Granulocytes: 1 %
Lymphocytes Relative: 8 %
Lymphs Abs: 0.9 10*3/uL (ref 0.7–4.0)
MCH: 29.8 pg (ref 26.0–34.0)
MCHC: 30 g/dL (ref 30.0–36.0)
MCV: 99.1 fL (ref 80.0–100.0)
Monocytes Absolute: 0.8 10*3/uL (ref 0.1–1.0)
Monocytes Relative: 7 %
Neutro Abs: 9.5 10*3/uL — ABNORMAL HIGH (ref 1.7–7.7)
Neutrophils Relative %: 84 %
Platelets: 212 10*3/uL (ref 150–400)
RBC: 4.47 MIL/uL (ref 4.22–5.81)
RDW: 12.9 % (ref 11.5–15.5)
WBC: 11.4 10*3/uL — ABNORMAL HIGH (ref 4.0–10.5)
nRBC: 0 % (ref 0.0–0.2)

## 2023-03-29 LAB — SEDIMENTATION RATE: Sed Rate: 22 mm/h — ABNORMAL HIGH (ref 0–16)

## 2023-03-29 LAB — C-REACTIVE PROTEIN: CRP: 13.3 mg/dL — ABNORMAL HIGH (ref <1.0)

## 2023-03-29 NOTE — ED Provider Triage Note (Signed)
Emergency Medicine Provider Triage Evaluation Note  [name] , a 68 y.o. male  was evaluated in triage.  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 States he came about a week ago, he has chronic pain in the site acutely worsened over the past few days.  On his baseline oxygen denies any respiratory symptoms other symptoms at this time..  Review of Systems  Positive: Wound, leg pain Negative: Fevers chills, nausea vomiting, syncope shortness of breath.  Physical Exam  There were no vitals taken for this visit. Gen:   Awake, no distress   Resp:  Normal effort  MSK:   Moves extremities without difficulty LLE AKA.  Notably this exam is limited because patient is in a public space, has an AKI and is wearing pants, he has refused to allow me to pull his pants up in order to look at the wound. Other:    Medical Decision Making  Medically screening exam initiated at 4:35 PM.  Appropriate orders placed.  EBONY RICKEL was informed that the remainder of the evaluation will be completed by another provider, this initial triage assessment does not replace that evaluation, and the importance of remaining in the ED until their evaluation is complete.     Glyn Ade, MD 03/29/23 2310

## 2023-03-29 NOTE — ED Triage Notes (Signed)
BIB EMS from home with pain in left amputated leg. Pain is chronic but worse today.

## 2023-03-30 ENCOUNTER — Emergency Department (HOSPITAL_COMMUNITY): Payer: 59

## 2023-03-30 ENCOUNTER — Encounter (HOSPITAL_COMMUNITY): Payer: Self-pay

## 2023-03-30 DIAGNOSIS — J9601 Acute respiratory failure with hypoxia: Secondary | ICD-10-CM | POA: Diagnosis not present

## 2023-03-30 DIAGNOSIS — L89899 Pressure ulcer of other site, unspecified stage: Secondary | ICD-10-CM | POA: Diagnosis present

## 2023-03-30 DIAGNOSIS — L97121 Non-pressure chronic ulcer of left thigh limited to breakdown of skin: Secondary | ICD-10-CM | POA: Diagnosis present

## 2023-03-30 DIAGNOSIS — J441 Chronic obstructive pulmonary disease with (acute) exacerbation: Secondary | ICD-10-CM | POA: Diagnosis present

## 2023-03-30 DIAGNOSIS — Z7409 Other reduced mobility: Secondary | ICD-10-CM | POA: Diagnosis present

## 2023-03-30 DIAGNOSIS — Z604 Social exclusion and rejection: Secondary | ICD-10-CM | POA: Diagnosis present

## 2023-03-30 DIAGNOSIS — E875 Hyperkalemia: Secondary | ICD-10-CM | POA: Diagnosis not present

## 2023-03-30 DIAGNOSIS — J9612 Chronic respiratory failure with hypercapnia: Secondary | ICD-10-CM | POA: Diagnosis present

## 2023-03-30 DIAGNOSIS — Z7951 Long term (current) use of inhaled steroids: Secondary | ICD-10-CM | POA: Diagnosis not present

## 2023-03-30 DIAGNOSIS — Z716 Tobacco abuse counseling: Secondary | ICD-10-CM | POA: Diagnosis not present

## 2023-03-30 DIAGNOSIS — R443 Hallucinations, unspecified: Secondary | ICD-10-CM | POA: Diagnosis present

## 2023-03-30 DIAGNOSIS — I10 Essential (primary) hypertension: Secondary | ICD-10-CM | POA: Diagnosis present

## 2023-03-30 DIAGNOSIS — Z96652 Presence of left artificial knee joint: Secondary | ICD-10-CM | POA: Diagnosis present

## 2023-03-30 DIAGNOSIS — F1010 Alcohol abuse, uncomplicated: Secondary | ICD-10-CM | POA: Diagnosis present

## 2023-03-30 DIAGNOSIS — E785 Hyperlipidemia, unspecified: Secondary | ICD-10-CM | POA: Diagnosis present

## 2023-03-30 DIAGNOSIS — F17210 Nicotine dependence, cigarettes, uncomplicated: Secondary | ICD-10-CM | POA: Diagnosis present

## 2023-03-30 DIAGNOSIS — J9622 Acute and chronic respiratory failure with hypercapnia: Secondary | ICD-10-CM | POA: Diagnosis present

## 2023-03-30 DIAGNOSIS — Z635 Disruption of family by separation and divorce: Secondary | ICD-10-CM | POA: Diagnosis not present

## 2023-03-30 DIAGNOSIS — T8744 Infection of amputation stump, left lower extremity: Secondary | ICD-10-CM | POA: Diagnosis present

## 2023-03-30 DIAGNOSIS — F1410 Cocaine abuse, uncomplicated: Secondary | ICD-10-CM | POA: Diagnosis present

## 2023-03-30 DIAGNOSIS — Y835 Amputation of limb(s) as the cause of abnormal reaction of the patient, or of later complication, without mention of misadventure at the time of the procedure: Secondary | ICD-10-CM | POA: Diagnosis present

## 2023-03-30 DIAGNOSIS — I739 Peripheral vascular disease, unspecified: Secondary | ICD-10-CM | POA: Diagnosis present

## 2023-03-30 DIAGNOSIS — G8929 Other chronic pain: Secondary | ICD-10-CM | POA: Diagnosis present

## 2023-03-30 DIAGNOSIS — E8729 Other acidosis: Secondary | ICD-10-CM | POA: Diagnosis present

## 2023-03-30 DIAGNOSIS — J9621 Acute and chronic respiratory failure with hypoxia: Secondary | ICD-10-CM | POA: Diagnosis present

## 2023-03-30 DIAGNOSIS — L0390 Cellulitis, unspecified: Secondary | ICD-10-CM | POA: Diagnosis present

## 2023-03-30 LAB — RAPID URINE DRUG SCREEN, HOSP PERFORMED
Amphetamines: NOT DETECTED
Barbiturates: NOT DETECTED
Benzodiazepines: NOT DETECTED
Cocaine: NOT DETECTED
Opiates: NOT DETECTED
Tetrahydrocannabinol: NOT DETECTED

## 2023-03-30 LAB — I-STAT VENOUS BLOOD GAS, ED
Acid-Base Excess: 17 mmol/L — ABNORMAL HIGH (ref 0.0–2.0)
Bicarbonate: 47.5 mmol/L — ABNORMAL HIGH (ref 20.0–28.0)
Calcium, Ion: 1.11 mmol/L — ABNORMAL LOW (ref 1.15–1.40)
HCT: 39 % (ref 39.0–52.0)
Hemoglobin: 13.3 g/dL (ref 13.0–17.0)
O2 Saturation: 34 %
Potassium: 4.5 mmol/L (ref 3.5–5.1)
Sodium: 135 mmol/L (ref 135–145)
TCO2: >50 mmol/L — ABNORMAL HIGH (ref 22–32)
pCO2, Ven: 91.3 mm[Hg] (ref 44–60)
pH, Ven: 7.324 (ref 7.25–7.43)
pO2, Ven: 24 mm[Hg] — CL (ref 32–45)

## 2023-03-30 LAB — BLOOD GAS, VENOUS
Acid-Base Excess: 10.6 mmol/L — ABNORMAL HIGH (ref 0.0–2.0)
Bicarbonate: 41.7 mmol/L — ABNORMAL HIGH (ref 20.0–28.0)
O2 Saturation: 30.5 %
Patient temperature: 37
pCO2, Ven: 93 mm[Hg] (ref 44–60)
pH, Ven: 7.26 (ref 7.25–7.43)
pO2, Ven: <31 mm[Hg] — CL (ref 32–45)

## 2023-03-30 LAB — BRAIN NATRIURETIC PEPTIDE: B Natriuretic Peptide: 66.6 pg/mL (ref 0.0–100.0)

## 2023-03-30 MED ORDER — OXYCODONE HCL 5 MG PO TABS
5.0000 mg | ORAL_TABLET | ORAL | Status: DC | PRN
  Administered 2023-03-31: 5 mg via ORAL
  Filled 2023-03-30: qty 1

## 2023-03-30 MED ORDER — ACETAMINOPHEN 325 MG PO TABS
650.0000 mg | ORAL_TABLET | Freq: Four times a day (QID) | ORAL | Status: DC | PRN
  Administered 2023-04-02: 650 mg via ORAL
  Filled 2023-03-30: qty 2

## 2023-03-30 MED ORDER — HYDRALAZINE HCL 20 MG/ML IJ SOLN: 10.0000 mg | Freq: Four times a day (QID) | INTRAMUSCULAR | Status: DC | PRN

## 2023-03-30 MED ORDER — GUAIFENESIN ER 600 MG PO TB12
600.0000 mg | ORAL_TABLET | Freq: Two times a day (BID) | ORAL | Status: DC
  Administered 2023-03-30 – 2023-04-03 (×10): 600 mg via ORAL
  Filled 2023-03-30 (×10): qty 1

## 2023-03-30 MED ORDER — IPRATROPIUM-ALBUTEROL 0.5-2.5 (3) MG/3ML IN SOLN
3.0000 mL | Freq: Four times a day (QID) | RESPIRATORY_TRACT | Status: DC
  Administered 2023-03-30 – 2023-03-31 (×3): 3 mL via RESPIRATORY_TRACT
  Filled 2023-03-30 (×4): qty 3

## 2023-03-30 MED ORDER — BUDESONIDE 0.5 MG/2ML IN SUSP
0.5000 mg | Freq: Two times a day (BID) | RESPIRATORY_TRACT | Status: DC
  Administered 2023-03-30 – 2023-04-04 (×11): 0.5 mg via RESPIRATORY_TRACT
  Filled 2023-03-30 (×11): qty 2

## 2023-03-30 MED ORDER — THIAMINE MONONITRATE 100 MG PO TABS
100.0000 mg | ORAL_TABLET | Freq: Every day | ORAL | Status: DC
  Administered 2023-03-31: 100 mg via ORAL
  Filled 2023-03-30 (×3): qty 1

## 2023-03-30 MED ORDER — FOLIC ACID 1 MG PO TABS
1.0000 mg | ORAL_TABLET | Freq: Every day | ORAL | Status: DC
  Administered 2023-03-30 – 2023-04-03 (×5): 1 mg via ORAL
  Filled 2023-03-30 (×4): qty 1

## 2023-03-30 MED ORDER — ALBUTEROL SULFATE (2.5 MG/3ML) 0.083% IN NEBU
2.5000 mg | INHALATION_SOLUTION | Freq: Four times a day (QID) | RESPIRATORY_TRACT | Status: DC | PRN
  Administered 2023-04-03: 2.5 mg via RESPIRATORY_TRACT
  Filled 2023-03-30: qty 3

## 2023-03-30 MED ORDER — THIAMINE HCL 100 MG/ML IJ SOLN
100.0000 mg | Freq: Every day | INTRAMUSCULAR | Status: DC
  Administered 2023-03-30 – 2023-04-03 (×4): 100 mg via INTRAVENOUS
  Filled 2023-03-30 (×4): qty 2

## 2023-03-30 MED ORDER — BISACODYL 5 MG PO TBEC
5.0000 mg | DELAYED_RELEASE_TABLET | Freq: Every day | ORAL | Status: DC | PRN
  Administered 2023-04-03: 5 mg via ORAL
  Filled 2023-03-30: qty 1

## 2023-03-30 MED ORDER — IPRATROPIUM-ALBUTEROL 0.5-2.5 (3) MG/3ML IN SOLN
3.0000 mL | Freq: Once | RESPIRATORY_TRACT | Status: AC
  Administered 2023-03-30: 3 mL via RESPIRATORY_TRACT
  Filled 2023-03-30: qty 3

## 2023-03-30 MED ORDER — CEPHALEXIN 250 MG PO CAPS
1000.0000 mg | ORAL_CAPSULE | Freq: Once | ORAL | Status: AC
  Administered 2023-03-30: 1000 mg via ORAL
  Filled 2023-03-30: qty 4

## 2023-03-30 MED ORDER — IPRATROPIUM-ALBUTEROL 0.5-2.5 (3) MG/3ML IN SOLN
3.0000 mL | RESPIRATORY_TRACT | Status: AC
  Administered 2023-03-30 (×3): 3 mL via RESPIRATORY_TRACT
  Filled 2023-03-30: qty 3

## 2023-03-30 MED ORDER — ADULT MULTIVITAMIN W/MINERALS CH
1.0000 | ORAL_TABLET | Freq: Every day | ORAL | Status: DC
  Administered 2023-03-30 – 2023-04-03 (×5): 1 via ORAL
  Filled 2023-03-30 (×5): qty 1

## 2023-03-30 MED ORDER — ENOXAPARIN SODIUM 40 MG/0.4ML IJ SOSY
40.0000 mg | PREFILLED_SYRINGE | INTRAMUSCULAR | Status: DC
  Administered 2023-03-30 – 2023-04-03 (×5): 40 mg via SUBCUTANEOUS
  Filled 2023-03-30 (×5): qty 0.4

## 2023-03-30 MED ORDER — LORAZEPAM 2 MG/ML IJ SOLN: 1.0000 mg | INTRAMUSCULAR | Status: AC | PRN

## 2023-03-30 MED ORDER — LORAZEPAM 1 MG PO TABS: 1.0000 mg | ORAL_TABLET | ORAL | Status: AC | PRN

## 2023-03-30 MED ORDER — GADOBUTROL 1 MMOL/ML IV SOLN
8.0000 mL | Freq: Once | INTRAVENOUS | Status: AC | PRN
  Administered 2023-03-30: 8 mL via INTRAVENOUS

## 2023-03-30 MED ORDER — PREDNISONE 20 MG PO TABS
60.0000 mg | ORAL_TABLET | Freq: Once | ORAL | Status: AC
  Administered 2023-03-30: 60 mg via ORAL
  Filled 2023-03-30: qty 3

## 2023-03-30 MED ORDER — ACETAMINOPHEN 650 MG RE SUPP: 650.0000 mg | Freq: Four times a day (QID) | RECTAL | Status: DC | PRN

## 2023-03-30 MED ORDER — POLYETHYLENE GLYCOL 3350 17 G PO PACK
17.0000 g | PACK | Freq: Every day | ORAL | Status: DC | PRN
  Administered 2023-04-02: 17 g via ORAL
  Filled 2023-03-30: qty 1

## 2023-03-30 NOTE — ED Notes (Signed)
Pt taken off BiPAP to eat and drink, tolerated well. O2 sats stayed consistently in the 94% - 96% range
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
## 2023-03-30 NOTE — Progress Notes (Signed)
CSW added substance abuse resources to patient's AVS.  Edwin Dada, MSW, LCSW Transitions of Care  Clinical Social Worker II [phone]

## 2023-03-30 NOTE — ED Provider Notes (Signed)
[place] EMERGENCY DEPARTMENT AT [facility] Provider Note   CSN: [id number] Arrival date & time: 03/29/23  1621     History {Add pertinent medical, surgical, social history, OB history to HPI:1} Chief Complaint  Patient presents with   Leg Pain    [name] is a 68 y.o. male.  67 yo M here with leg pain but then found to be sob as well. Pain is worse today but has some pain at all times. No fever. No cough. No other associatd symptoms.    Leg Pain      Home Medications Prior to Admission medications   Medication Sig Start Date End Date Taking? Authorizing Provider  albuterol (VENTOLIN HFA) 108 (90 Base) MCG/ACT inhaler Inhale 2 puffs into the lungs every 4 (four) hours as needed for wheezing or shortness of breath. 11/20/22   Elayne Snare K, DO  azithromycin (ZITHROMAX) 250 MG tablet Take 1 tablet (250 mg total) by mouth daily. Starting tomorrow, take 1 every day until finished. 11/20/22   Elayne Snare K, DO  budesonide (PULMICORT) 0.5 MG/2ML nebulizer solution Take 2 mLs (0.5 mg total) by nebulization 2 (two) times daily. 08/02/22   Jonah Blue, MD  diclofenac (VOLTAREN) 75 MG EC tablet Take 75 mg by mouth 2 (two) times daily as needed. 02/03/22   [provider]  guaiFENesin (MUCINEX) 600 MG 12 hr tablet Take 1 tablet (600 mg total) by mouth 2 (two) times daily. 08/02/22   Jonah Blue, MD  ipratropium-albuterol (DUONEB) 0.5-2.5 (3) MG/3ML SOLN Take 3 mLs by nebulization 4 (four) times daily. 08/02/22   Jonah Blue, MD  lisinopril (ZESTRIL) 20 MG tablet Take 1 tablet (20 mg total) by mouth daily with breakfast. 06/09/20   Medina-Vargas, Monina C, NP  nicotine (NICODERM CQ - DOSED IN MG/24 HOURS) 21 mg/24hr patch Place 1 patch (21 mg total) onto the skin daily. 06/09/20   Medina-Vargas, Monina C, NP  OXYGEN Inhale 1-3 L into the lungs See admin instructions. Use overnight and when resting in bed    [provider]   predniSONE (DELTASONE) 10 MG tablet Take 4 tablets (40 mg total) by mouth daily. 11/20/22   Elayne Snare K, DO  tiZANidine (ZANAFLEX) 4 MG tablet Take 1 tablet (4 mg total) by mouth every morning. 06/09/20   Medina-Vargas, Monina C, NP  zinc sulfate 220 (50 Zn) MG capsule Take 1 capsule (220 mg total) by mouth daily. 06/09/20   Medina-Vargas, Monina C, NP      Allergies    Patient has no known allergies.    Review of Systems   Review of Systems  Physical Exam Updated Vital Signs BP 103/61   Pulse 86   Temp 98.1 F (36.7 C) (Oral)   Resp 19   SpO2 97%  Physical Exam Vitals and nursing note reviewed.  Constitutional:      Appearance: He is well-developed.  HENT:     Head: Normocephalic and atraumatic.     Mouth/Throat:     Mouth: Mucous membranes are dry.  Eyes:     Pupils: Pupils are equal, round, and reactive to light.  Cardiovascular:     Rate and Rhythm: Tachycardia present.  Pulmonary:     Effort: Pulmonary effort is normal. No respiratory distress.     Breath sounds: Wheezing present.  Abdominal:     General: There is no distension.  Musculoskeletal:        General: Normal range of motion.  Cervical back: Normal range of motion.  Skin:    General: Skin is warm and dry.     Comments: Small wound to left stump with some mild induration/erythema.  Neurological:     General: No focal deficit present.     Mental Status: He is alert.     ED Results / Procedures / Treatments   Labs (all labs ordered are listed, but only abnormal results are displayed) Labs Reviewed  BASIC METABOLIC PANEL - Abnormal; Notable for the following components:      Result Value   Potassium 5.4 (*)    Chloride 90 (*)    Creatinine, Ser 0.53 (*)    Anion gap 19 (*)    All other components within normal limits  CBC WITH DIFFERENTIAL/PLATELET - Abnormal; Notable for the following components:   WBC 11.4 (*)    Neutro Abs 9.5 (*)    All other components within normal limits   C-REACTIVE PROTEIN - Abnormal; Notable for the following components:   CRP 13.3 (*)    All other components within normal limits  SEDIMENTATION RATE - Abnormal; Notable for the following components:   Sed Rate 22 (*)    All other components within normal limits  BLOOD GAS, VENOUS - Abnormal; Notable for the following components:   pCO2, Ven 93 (*)    pO2, Ven <31 (*)    Bicarbonate 41.7 (*)    Acid-Base Excess 10.6 (*)    All other components within normal limits  BRAIN NATRIURETIC PEPTIDE  CBC WITH DIFFERENTIAL/PLATELET    EKG None  Radiology DG Chest Portable 1 View Result Date: 03/30/2023 CLINICAL DATA:  Shortness of breath EXAM: PORTABLE CHEST 1 VIEW COMPARISON:  11/20/2022 FINDINGS: Heart and mediastinal contours are within normal limits. No focal opacities or effusions. No acute bony abnormality. IMPRESSION: No active disease. Electronically Signed   By: Charlett Nose M.D.   On: 03/30/2023 01:06   DG FEMUR MIN 2 VIEWS LEFT Result Date: 03/29/2023 CLINICAL DATA:  Chronic left leg pain, prior mid femoral amputation EXAM: LEFT FEMUR 2 VIEWS COMPARISON:  07/26/2022 FINDINGS: Frontal and lateral views of the left femur are obtained. Prior amputation at the level of the mid diaphysis, with postsurgical changes related to prior orthopedic hardware noted. Mild heterotopic ossification at the amputation site. No acute or destructive bony abnormalities. There is thinning of the soft tissues overlying the femoral amputation site, with no definite ulceration or subcutaneous gas noted. No periosteal reaction to suggest osteomyelitis. Left hip is well aligned. Diffuse atherosclerosis. IMPRESSION: 1. Progressive thinning of the soft tissues overlying the femoral amputation site, with no evidence of soft tissue ulceration or underlying osteomyelitis. Electronically Signed   By: Sharlet Salina M.D.   On: 03/29/2023 18:42    Procedures Procedures    Medications Ordered in ED Medications   ipratropium-albuterol (DUONEB) 0.5-2.5 (3) MG/3ML nebulizer solution 3 mL (3 mLs Nebulization Given 03/30/23 0045)  ipratropium-albuterol (DUONEB) 0.5-2.5 (3) MG/3ML nebulizer solution 3 mL (3 mLs Nebulization Given 03/30/23 0237)  predniSONE (DELTASONE) tablet 60 mg (60 mg Oral Given 03/30/23 0231)  cephALEXin (KEFLEX) capsule 1,000 mg (1,000 mg Oral Given 03/30/23 0231)  gadobutrol (GADAVIST) 1 MMOL/ML injection 8 mL (8 mLs Intravenous Contrast Given 03/30/23 0540)    ED Course/ Medical Decision Making/ A&P                                 Medical Decision  Making Amount and/or Complexity of Data Reviewed Labs: ordered. Radiology: ordered.  Risk Prescription drug management.   ESR/CRP elevated - will MRI for osteo. Xr inconclusive.   Also with respiratory difficulty not improved much with breathing treatments. Vbg done and shows resp acidosis, partially compensated. Will start bipap. Reeval for disposition.         Final Clinical Impression(s) / ED Diagnoses Final diagnoses:  Left leg pain    Rx / DC Orders ED Discharge Orders     None

## 2023-03-30 NOTE — ED Notes (Signed)
Pt a difficult stick. This RN attempted twice for phlebotomy and one unsuccessful PIV. Success blood draw from PIV.

## 2023-03-30 NOTE — ED Provider Notes (Signed)
   ED Course / MDM   Clinical Course as of 03/30/23 0948  Thu Mar 30, 2023  0720 Received sign out from Dr. 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 Found also to have hypercapnea with COPD exacerbation, on BiPAP. Pending MRI result, need admission [WS]  0948 MRI negative for osteomyelitis. VBG improved on Bipap. Will continue with Bipap for now. Patient reports he does feel better. Admitted to hospitalist Dr. Pola Corn.  [WS]    Clinical Course User Index [WS] Lonell Grandchild, MD   Medical Decision Making Amount and/or Complexity of Data Reviewed Labs: ordered. Radiology: ordered.  Risk Prescription drug management. Decision regarding hospitalization.          Lonell Grandchild, MD 03/30/23 [phone]

## 2023-03-30 NOTE — Discharge Instructions (Addendum)
Outpatient Substance Abuse  Treatment- uninsured  Narcotics Anonymous 24-HOUR HELPLINE Pre-recorded for Meeting Schedules [facility] [phone]  [email or web address] ALCOHOLICS ANONYMOUS  [facility]  Answering Service [phone] Please Note: All [place] Meetings are Non-smoking [email or web address].es  Alcohol and Drug Services -  Insurance: Medicaid /State funding/private insurance Methadone, suboxone/Intensive outpatient  [place]   [phone] Fax: [phone] [address], [place], [place], [address] [place] [phone] Fax: [phone]    [address] [place], [place], [address] ([address] [place], [place], [place], [place], [place], [place], [place], [place]) [facility] [email or web address] Accepts State funding/Medicaid Transitional housing, Intensive Outpatient Treatment, Outpatient treatment, Veterans Services  Phone: [phone] Fax: [phone] Address: [address], [place] [address]   [facility] [employer] (http://[email or web address].info/) Insurance: Medicaid Case Management, Administrator, arts, Medication Management, Outpatient Therapy, Psychosocial Rehabilitation, Substance Abuse Intensive Outpatient  Phone: [phone] Fax: [phone] [address], [place], [place], [address]  [facility] Medicaid, most private insurance providers Types of Program: Individual/Group Therapy, Substance Abuse Treatment  Phone: [place] [phone] Fax: [phone] [address], [place], [place], [address] [facility] [phone] [address], [address] [place], [place], [address]  [facility]  Medicaid Types of Program: SAIOP  Phone: [phone] Fax: [phone] [address] [place], [place], [place], [address] [facility] Medicaid/state funds Crisis line [phone] [facility] [phone] [place] [phone]  [place] [phone]  [facility] [address];  [place], [place] [address] [phone]  Substance Abuse Intensive Outpatient Program [facility] [address] [place], [place] [address] [phone]- Substance abuse treatment  Successful Transitions  Insurance: [facility], [address], sliding scale Types of Program: substance abuse treatment, transportation assistance Phone: [phone] Fax: [phone] Address: [address], [place] [address] [facility] (TrendSwap.ch) Insurance: UHC, [place], [place], [place] Program: addiction counseling, detoxification,  Phone: [phone]  Fax: [phone] Address: [address] [place], [place] [address]  [facility] (statewide facilities/programs) [address] (Medicaid/state funds) [place], [place] [address]                      http://[email or web address]/ 5755972706 Marcy Panning- [phone] [place]- [phone] [facility] [place] (2 Locations) (Medicaid/state funds) --[address]  walk in 8:30-12 and 1-2:30 [place], [id number]   [facility]- [phone] --[address] [place], [place] [address]  [phone] walk in 8:30-12 and 2-3:30  [facility] state funds/medicaid [address] [place], [place] [address] [phone] [facility] (Suboxone clinic) Medicaid/state funds  [address]  [place], [place] [address] [phone]   [facility]  [address] [place], [place] [address]  [phone] (24 hours) [place]- [address] [place], [place] [address]  [phone] (24 hours) [place]- [address] [place] [phone] [place]- [address] Rosalita Levan [phone] Turner Daniels [address] [place] [phone] [facility]- Medicaid and state funds  [place]- [address] [place], [place] [address] [phone] (24 hours) [place]- [address] [place],  [place] [address] [phone] [facility]- [address] [place], [place] [address] [phone] (24 hours) [place] [address]  [address] [place], [place]  [address] [phone] [place]- [address] [place] Ace [phone]      Follow with Primary MD Fleet Contras, MD in 7 days, follow-up with your orthopedic surgeon Dr. Lajoyce Corners within a week of discharge  Get CBC, CMP, 2 view Chest X ray -  checked next visit with your primary MD   Activity: As tolerated with Full fall precautions use walker/cane & assistance as needed  Disposition Home    Diet: Heart Healthy   Special Instructions: If you have smoked or chewed Tobacco  in the last 2 yrs please stop smoking, stop any regular Alcohol  and or any Recreational drug use.  On your next visit with your primary care physician please Get Medicines reviewed and adjusted.  Please request your Prim.MD to go over all Hospital Tests and Procedure/Radiological results at the follow up, please get all Hospital records sent to your Prim MD by signing hospital release before you go home.  If you experience worsening of your admission symptoms, develop shortness of breath, life threatening emergency, suicidal or homicidal thoughts you must seek medical attention immediately by calling 911 or calling your MD immediately  if symptoms less severe.  You Must read complete instructions/literature along with all the possible adverse reactions/side effects for all the Medicines you take and that have been prescribed to you. Take any new Medicines after you have completely understood and accpet all the possible adverse reactions/side effects.   Do not drive when taking Pain medications.  Do not take more than prescribed Pain, Sleep and Anxiety Medications

## 2023-03-31 ENCOUNTER — Other Ambulatory Visit: Payer: Self-pay

## 2023-03-31 DIAGNOSIS — J9601 Acute respiratory failure with hypoxia: Secondary | ICD-10-CM | POA: Diagnosis not present

## 2023-03-31 DIAGNOSIS — J9612 Chronic respiratory failure with hypercapnia: Secondary | ICD-10-CM | POA: Diagnosis not present

## 2023-03-31 LAB — BASIC METABOLIC PANEL
Anion gap: 13 (ref 5–15)
BUN: 10 mg/dL (ref 8–23)
CO2: 35 mmol/L — ABNORMAL HIGH (ref 22–32)
Calcium: 8.8 mg/dL — ABNORMAL LOW (ref 8.9–10.3)
Chloride: 87 mmol/L — ABNORMAL LOW (ref 98–111)
Creatinine, Ser: 0.44 mg/dL — ABNORMAL LOW (ref 0.61–1.24)
GFR, Estimated: >60 mL/min (ref >60)
Glucose, Bld: 99 mg/dL (ref 70–99)
Potassium: 4.4 mmol/L (ref 3.5–5.1)
Sodium: 135 mmol/L (ref 135–145)

## 2023-03-31 LAB — CBC
HCT: 39.2 % (ref 39.0–52.0)
Hemoglobin: 11.9 g/dL — ABNORMAL LOW (ref 13.0–17.0)
MCH: 29.4 pg (ref 26.0–34.0)
MCHC: 30.4 g/dL (ref 30.0–36.0)
MCV: 96.8 fL (ref 80.0–100.0)
Platelets: 192 10*3/uL (ref 150–400)
RBC: 4.05 MIL/uL — ABNORMAL LOW (ref 4.22–5.81)
RDW: 13 % (ref 11.5–15.5)
WBC: 5.6 10*3/uL (ref 4.0–10.5)
nRBC: 0 % (ref 0.0–0.2)

## 2023-03-31 MED ORDER — IPRATROPIUM-ALBUTEROL 0.5-2.5 (3) MG/3ML IN SOLN
3.0000 mL | Freq: Four times a day (QID) | RESPIRATORY_TRACT | Status: DC
  Administered 2023-04-01 (×2): 3 mL via RESPIRATORY_TRACT
  Filled 2023-03-31 (×2): qty 3

## 2023-03-31 MED ORDER — PIPERACILLIN-TAZOBACTAM 3.375 G IVPB
3.3750 g | Freq: Three times a day (TID) | INTRAVENOUS | Status: DC
  Administered 2023-03-31 – 2023-04-04 (×11): 3.375 g via INTRAVENOUS
  Filled 2023-03-31 (×11): qty 50

## 2023-03-31 MED ORDER — VANCOMYCIN HCL 750 MG/150ML IV SOLN
750.0000 mg | Freq: Two times a day (BID) | INTRAVENOUS | Status: DC
  Administered 2023-03-31 – 2023-04-03 (×7): 750 mg via INTRAVENOUS
  Filled 2023-03-31 (×9): qty 150

## 2023-03-31 MED ORDER — VANCOMYCIN HCL 1250 MG/250ML IV SOLN
1250.0000 mg | Freq: Once | INTRAVENOUS | Status: AC
Start: 2023-03-31 — End: 2023-03-31
  Administered 2023-03-31: 1250 mg via INTRAVENOUS
  Filled 2023-03-31: qty 250

## 2023-03-31 NOTE — Progress Notes (Signed)
   03/31/23 1524  TOC Brief Assessment  Insurance and Status Reviewed [employer] Dual Complete)  Patient has primary care physician Yes Concepcion Elk, Edwin, MD)  Home environment has been reviewed Lives with Daughter  (Not EC )  Prior level of function: power chair (amputee)  Prior/Current Home Services No current home services (Has had [facility] in the past)  Social Drivers of Health Review SDOH reviewed interventions complete (Smoking cessation attached)  Readmission risk has been reviewed Yes (15%)  Transition of care needs transition of care needs identified, TOC will continue to follow    TOC following for RECS   RNCM has spoken with Emergency Contact Daughter, Abran Duke  [facility] [place] has concerns about patients living conditions  She stated that patient lives with her other Sister and "allows patient to get high and drink " just so she can collect his monthly heck.  EC Daughter stated that she has reported this to APS in the past (1 month ago)  Not sure of outcome. EC stated that  she herself takes patient to Dr's appointments etc. She has great concerns for patient to return to that environment.   TOC has provided smoking cessation information on DC instructions. TOC will continue to follow patient for any additional discharge needs

## 2023-03-31 NOTE — Progress Notes (Addendum)
Patient's RN reporting that while she was maneuvering patient's left-sided leg around the stump area patient had some pus coming out which is greenish in color.  Patient is also complaining about pain surrounding the stump area.  Per chart review patient has been admitted for the management of acute on chronic hypercapnic hypoxic respiratory failure.   Patient also had acute on chronic pink of the left stump area.  Initially the patient has been given IV cephalexin one-time dose.  Antibiotic has not continued given physical exam initially did not showed any evidence of cellulitis. Patient is afebrile and my leukocytosis. MRI of the femur showed postsurgical change a and less likely any acute osteomyelitis.  Soft tissue thickening over the distal stump with residual hardware close approximate to the skin surface without any underlying soft tissue edema and fluid collection.  Even though MRI did not show any evidence of abscess formation or cellulitis given physical exam showing some greenish discoloration around the stump area starting broad-spectrum IV antibiotic coverage with IV vancomycin.  Obtaining blood cultures. - Need to inform orthopedics in the daytime to evaluate around the stump area. Patient follows Dr. Lajoyce Corners with [facility] orthopedics group.  Tereasa Coop, MD [facility] 03/31/2023, 6:45 AM

## 2023-03-31 NOTE — Progress Notes (Signed)
Unable to obtain ABG.

## 2023-03-31 NOTE — Progress Notes (Signed)
Patient admitted to room [id number]. Oriented to unit. Alert and oriented to person, place and situation. Skin checked done with two RN. Left stump noted with small amount of purulent discharged. Dressing to be applied.Marland Kitchen

## 2023-03-31 NOTE — ED Notes (Signed)
While adjusting this patient in bed, he grimaced in pain - motioned towards his left leg (above the knee amputation). This leg had begun to drain pus (brownish green in color). I placed a gauze over the site and taped it. Admission physician Orest Dikes) aware.

## 2023-03-31 NOTE — Consult Note (Signed)
WOC Nurse Consult Note: Reason for Consult: left stump wound; AKA 05/22/20 per Dr. Lajoyce Corners Wound type: infectious, surgical wound has healed at this point, new onset of purulent drainage this far out would more inclined to be infectious  Pressure Injury POA: NA Measurement: see nursing flow sheet Wound bed: requested images, noted opening with turning of patient and spontaneous drainage Drainage (amount, consistency, odor) brownish/green in color per notes Periwound: intact  Dressing procedure/placement/frequency: Use silver hydrofiber,Aquacel Ag+ [place] # [id number])cut into small strip and tucked into opening to serve as wick for drainage, top with foam. Change every other day.    Pending orthopedic evaluation    Discussed POC with patient and bedside nurse.  Re consult if needed, will not follow at this time. Thanks  Tyrika Newman [employer], RN,CWOCN, CNS, CWON-AP [phone])

## 2023-03-31 NOTE — Progress Notes (Signed)
Attempted to collect aerobic/ anaerobic culture of left aka incision. Area is dry and no drainage at this time.

## 2023-03-31 NOTE — Progress Notes (Addendum)
Pharmacy Antibiotic Note  [name] is a 68 y.o. male admitted on 03/29/2023 with acute on chronic pain in left AKA stump with concerns for infection given pus. Pharmacy has been consulted for vancomycin dosing.  -Keflex PO x1 in ED -WBC 11.4, sCr 0.53, afebrile -Blood cultures ordered -Left leg MRI: negative for osteomyelitis -09/2019 Left leg abscess culture: MRSA (S-Vanc)  Plan: -Vancomycin 1250mg  IV x1 -Vancomycin 750mg  IV every 12 hours (AUC 536, TBW, Vd 0.72) -Monitor renal function -Follow up signs of clinical improvement, LOT, de-escalation of antibiotics -F/u ortho consult      Temp (24hrs), Avg:98 F (36.7 C), Min:97.5 F (36.4 C), Max:98.2 F (36.8 C)  Recent Labs  Lab 03/29/23 1637 03/29/23 1745  WBC  --  11.4*  CREATININE 0.53*  --     CrCl cannot be calculated (Unknown ideal weight.).    No Known Allergies  Antimicrobials this admission: Vancomycin 2/14  >>   Microbiology results: 2/14 BCx:   Thank you for allowing pharmacy to be a part of this patient's care.  Arabella Merles, PharmD. Clinical Pharmacist 03/31/2023 6:56 AM

## 2023-03-31 NOTE — Progress Notes (Addendum)
PROGRESS NOTE    [name]  ZOX:[id number] DOB: [birth year] DOA: 03/29/2023 PCP: Fleet Contras, MD  Outpatient Specialists:     Brief Narrative:  Patient is a 68 year old male, with past medical history significant for polysubstance abuse (alcohol, cocaine and tobacco use), noncompliance, hypertension, hyperlipidemia, peripheral artery disease status post left AKA, COPD on 2 to 3 L of oxygen.  Patient was admitted with worsening of the pain involving the left AKA stump.  There are also concerns for drainage.  Patient is known to the orthopedic team.  Will consult Dr. Lajoyce Corners.  Will also send samples for cultures.  Patient is already on broad-spectrum antibiotics.  MRI of the left femur was said to be unlikely osteomyelitis.   Assessment & Plan:   Principal Problem:   Acute hypoxic on chronic hypercapnic respiratory failure (HCC)   Acute on chronic hypercapnic hypoxic respiratory failure COPD Probably has baseline CO2 retention but this time has significantly elevated CO2 to 93 with acidosis. Started on BiPAP.  I will continue it for next 24 hours with breaks and repeat blood gas tomorrow. Wean down as tolerated Previously on 2 to 3 L oxygen at home.  Mildly elevated WBC count but chest  x-ray without any infiltrates Continue bronchodilators, Mucinex 03/31/2023: Add nebs DuoNeb.  Continue nebs Pulmicort.  Will hold off on steroids for now.   Acute on chronic left AKA stump pain/infected left AKA stump: No evidence of cellulitis clinically. Although sed rate elevated to 22, CRP elevated to 13.3, imaging without evidence of osteomyelitis Patient was given 1 dose of IV cephalexin in the ED.  I am not sure if antibiotic would be of any benefit.  Monitor off antibiotics. Last Labs     Recent Labs  Lab 03/29/23 1745  WBC 11.4*      Hyperkalemia Potassium level was elevated to 5.4 yesterday.  Improved this morning.  Continue to monitor Last Labs      Recent Labs  Lab  03/29/23 1637 03/30/23 0812  K 5.4* 4.5    03/31/2023: Wound culture.  Orthopedic team consulted.  Broad-spectrum antibiotics.  Hypertension -Blood pressure is controlled.     PAD s/p prior left AKA HLD -Noncompliant. -Orthopedic team has been consulted.   Polysubstance abuse (smoking, alcohol, cocaine) Counseled to quit Nicotine patch offered. CIWA protocol ordered with as needed Ativan   noncompliance to medications Encourage compliance   Impaired mobility Does not use a prosthesis.  Uses an electric wheelchair to get around.   DVT prophylaxis: Subcutaneous Lovenox Code Status: Full code Family Communication: Daughter Disposition Plan: Patient remains inpatient.  Several severe comorbidities.   Consultants:  Orthopedic surgery.  Procedures:  None.  Antimicrobials:  IV vancomycin. And IV Zosyn.   Subjective: Left AKA stump infection.  Objective: Vitals:   03/31/23 1200 03/31/23 1203 03/31/23 1245 03/31/23 1500  BP: 112/72  123/86   Pulse: 76  (!) 55   Resp: (!) 23  (!) 35   Temp:  98.2 F (36.8 C)    TempSrc:  Oral    SpO2: 92%  97%   Weight:    45.9 kg  Height:    5\' 6"  (1.676 m)    Intake/Output Summary (Last 24 hours) at 03/31/2023 1554 Last data filed at 03/31/2023 1042 Gross per 24 hour  Intake --  Output 250 ml  Net -250 ml   Filed Weights   03/31/23 0700 03/31/23 1500  Weight: 55 kg 45.9 kg    Examination:  General exam: Looks  chronically ill.    Respiratory system: Decreased air entry.   Cardiovascular system: S1 & S2 heard Gastrointestinal system: Abdomen is soft and nontender.   Central nervous system: Awake and alert.   Extremities:    Data Reviewed: I have personally reviewed following labs and imaging studies  CBC: Recent Labs  Lab 03/29/23 1745 03/30/23 0812 03/31/23 0643  WBC 11.4*  --  5.6  NEUTROABS 9.5*  --   --   HGB 13.3 13.3 11.9*  HCT 44.3 39.0 39.2  MCV 99.1  --  96.8  PLT 212  --  192   Basic  Metabolic Panel: Recent Labs  Lab 03/29/23 1637 03/30/23 0812 03/31/23 0643  NA 139 135 135  K 5.4* 4.5 4.4  CL 90*  --  87*  CO2 30  --  35*  GLUCOSE 96  --  99  BUN 23  --  10  CREATININE 0.53*  --  0.44*  CALCIUM 9.0  --  8.8*   GFR: Estimated Creatinine Clearance: 57.4 mL/min (A) (by C-G formula based on SCr of 0.44 mg/dL (L)). Liver Function Tests: No results for input(s): "AST", "ALT", "ALKPHOS", "BILITOT", "PROT", "ALBUMIN" in the last 168 hours. No results for input(s): "LIPASE", "AMYLASE" in the last 168 hours. No results for input(s): "AMMONIA" in the last 168 hours. Coagulation Profile: No results for input(s): "INR", "PROTIME" in the last 168 hours. Cardiac Enzymes: No results for input(s): "CKTOTAL", "CKMB", "CKMBINDEX", "TROPONINI" in the last 168 hours. BNP (last 3 results) No results for input(s): "PROBNP" in the last 8760 hours. HbA1C: No results for input(s): "HGBA1C" in the last 72 hours. CBG: No results for input(s): "GLUCAP" in the last 168 hours. Lipid Profile: No results for input(s): "CHOL", "HDL", "LDLCALC", "TRIG", "CHOLHDL", "LDLDIRECT" in the last 72 hours. Thyroid Function Tests: No results for input(s): "TSH", "T4TOTAL", "FREET4", "T3FREE", "THYROIDAB" in the last 72 hours. Anemia Panel: No results for input(s): "VITAMINB12", "FOLATE", "FERRITIN", "TIBC", "IRON", "RETICCTPCT" in the last 72 hours. Urine analysis:    Component Value Date/Time   COLORURINE YELLOW 05/30/2021 1320   APPEARANCEUR CLEAR 05/30/2021 1320   LABSPEC 1.020 05/30/2021 1320   PHURINE 5.0 05/30/2021 1320   GLUCOSEU NEGATIVE 05/30/2021 1320   HGBUR NEGATIVE 05/30/2021 1320   BILIRUBINUR NEGATIVE 05/30/2021 1320   KETONESUR NEGATIVE 05/30/2021 1320   PROTEINUR NEGATIVE 05/30/2021 1320   UROBILINOGEN 0.2 03/21/2013 1135   NITRITE NEGATIVE 05/30/2021 1320   LEUKOCYTESUR NEGATIVE 05/30/2021 1320   Sepsis Labs: @LABRCNTIP (procalcitonin:4,lacticidven:4)  )No results  found for this or any previous visit (from the past 240 hours).       Radiology Studies: MR FEMUR LEFT W WO CONTRAST Result Date: 03/30/2023 CLINICAL DATA:  Chronic left leg pain, infection EXAM: MR OF THE LEFT LOWER EXTREMITY WITHOUT AND WITH CONTRAST TECHNIQUE: Multiplanar, multisequence MR imaging of the left thigh was performed both before and after administration of intravenous contrast. CONTRAST:  8mL GADAVIST GADOBUTROL 1 MMOL/ML IV SOLN COMPARISON:  X-ray 03/29/2023, MRI 07/28/2020 FINDINGS: Bones/Joint/Cartilage Postsurgical changes from above knee amputation of the left lower extremity. Residual hardware within the femoral diaphysis. Redemonstration of increased T2 signal within the anterior aspect of the distal femur (series 7, image 19; series 6, image 50). This is less pronounced when compared with the prior MRI from 2022. Given stability of this finding, this is felt to be unlikely secondary to acute osteomyelitis. No additional sites of bone marrow edema are identified. No fracture or dislocation. No femoral head avascular necrosis. Mild-to-moderate  degenerative changes of both hips. Ligaments Intact. Muscles and Tendons Progressed muscle atrophy. Intramuscular edema, more pronounced in the anterior compartment which may be related to denervation or myositis. No acute tendon injury. Soft tissues Soft tissue thinning over the distal stump with residual hardware closely approximating the skin surface. No overlying soft tissue edema or fluid collection. 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 IMPRESSION: 1. Postsurgical changes from above knee amputation of the left lower extremity. Redemonstration of increased T2 signal within the anterior aspect of the distal femur. This is less pronounced when compared with the prior MRI from 2022. Given stability of this finding, this is felt to be unlikely secondary to acute osteomyelitis. 2. Soft tissue thinning over  the distal stump with residual hardware closely approximating the skin surface. No overlying soft tissue edema or fluid collection. 3. Intramuscular edema, more pronounced in the anterior compartment which may be related to denervation or myositis. 4. 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 Electronically Signed   By: Duanne Guess D.O.   On: 03/30/2023 08:29   DG Chest Portable 1 View Result Date: 03/30/2023 CLINICAL DATA:  Shortness of breath EXAM: PORTABLE CHEST 1 VIEW COMPARISON:  11/20/2022 FINDINGS: Heart and mediastinal contours are within normal limits. No focal opacities or effusions. No acute bony abnormality. IMPRESSION: No active disease. Electronically Signed   By: Charlett Nose M.D.   On: 03/30/2023 01:06   DG FEMUR MIN 2 VIEWS LEFT Result Date: 03/29/2023 CLINICAL DATA:  Chronic left leg pain, prior mid femoral amputation EXAM: LEFT FEMUR 2 VIEWS COMPARISON:  07/26/2022 FINDINGS: Frontal and lateral views of the left femur are obtained. Prior amputation at the level of the mid diaphysis, with postsurgical changes related to prior orthopedic hardware noted. Mild heterotopic ossification at the amputation site. No acute or destructive bony abnormalities. There is thinning of the soft tissues overlying the femoral amputation site, with no definite ulceration or subcutaneous gas noted. No periosteal reaction to suggest osteomyelitis. Left hip is well aligned. Diffuse atherosclerosis. IMPRESSION: 1. Progressive thinning of the soft tissues overlying the femoral amputation site, with no evidence of soft tissue ulceration or underlying osteomyelitis. Electronically Signed   By: Sharlet Salina M.D.   On: 03/29/2023 18:42        Scheduled Meds:  budesonide  0.5 mg Nebulization BID   enoxaparin (LOVENOX) injection  40 mg Subcutaneous Q24H   folic acid  1 mg Oral Daily   guaiFENesin  600 mg Oral BID   multivitamin with minerals  1 tablet Oral Daily    thiamine  100 mg Oral Daily   Or   thiamine  100 mg Intravenous Daily   Continuous Infusions:  vancomycin       LOS: 1 day    Time spent: 35 minutes.    Berton Mount, MD  [facility] Pager #: [phone] 7PM-7AM contact night coverage as above

## 2023-03-31 NOTE — Progress Notes (Signed)
Pharmacy Antibiotic Note  [name] is a 68 y.o. male admitted on 03/29/2023 with acute on chronic pain in left AKA stump with concerns for infection given pus. Pharmacy has been consulted for vancomycin dosing. Now adding Zosyn.   Plan: Zosyn 3.375gm IV q8h Vancomycin 750mg  IV every 12 hours (AUC 536, TBW, Vd 0.72) Monitor renal function Follow up signs of clinical improvement, LOT, de-escalation of antibiotics  Height: 5\' 6"  (167.6 cm) Weight: 45.9 kg (101 lb 3.1 oz) IBW/kg (Calculated) : 63.8 Temp (24hrs), Avg:98.1 F (36.7 C), Min:98 F (36.7 C), Max:98.2 F (36.8 C)  Recent Labs  Lab 03/29/23 1637 03/29/23 1745 03/31/23 0643  WBC  --  11.4* 5.6  CREATININE 0.53*  --  0.44*    Estimated Creatinine Clearance: 57.4 mL/min (A) (by C-G formula based on SCr of 0.44 mg/dL (L)).    No Known Allergies  Antimicrobials this admission: Vancomycin 2/14  >>  Zosyn 2/14 >>  Microbiology results: 2/14 BCx:   Thank you for allowing pharmacy to be a part of this patient's care.  Christoper Fabian, PharmD, BCPS Please see amion for complete clinical pharmacist phone list 03/31/2023 9:37 PM

## 2023-04-01 ENCOUNTER — Inpatient Hospital Stay (HOSPITAL_COMMUNITY): Payer: 59

## 2023-04-01 DIAGNOSIS — L97121 Non-pressure chronic ulcer of left thigh limited to breakdown of skin: Secondary | ICD-10-CM | POA: Diagnosis not present

## 2023-04-01 DIAGNOSIS — J9601 Acute respiratory failure with hypoxia: Secondary | ICD-10-CM | POA: Diagnosis not present

## 2023-04-01 DIAGNOSIS — J9612 Chronic respiratory failure with hypercapnia: Secondary | ICD-10-CM | POA: Diagnosis not present

## 2023-04-01 LAB — CBC WITH DIFFERENTIAL/PLATELET
Abs Immature Granulocytes: 0.04 10*3/uL (ref 0.00–0.07)
Basophils Absolute: 0 10*3/uL (ref 0.0–0.1)
Basophils Relative: 0 %
Eosinophils Absolute: 0 10*3/uL (ref 0.0–0.5)
Eosinophils Relative: 1 %
HCT: 41.2 % (ref 39.0–52.0)
Hemoglobin: 12.5 g/dL — ABNORMAL LOW (ref 13.0–17.0)
Immature Granulocytes: 1 %
Lymphocytes Relative: 20 %
Lymphs Abs: 1 10*3/uL (ref 0.7–4.0)
MCH: 28.9 pg (ref 26.0–34.0)
MCHC: 30.3 g/dL (ref 30.0–36.0)
MCV: 95.4 fL (ref 80.0–100.0)
Monocytes Absolute: 0.5 10*3/uL (ref 0.1–1.0)
Monocytes Relative: 10 %
Neutro Abs: 3.4 10*3/uL (ref 1.7–7.7)
Neutrophils Relative %: 68 %
Platelets: 222 10*3/uL (ref 150–400)
RBC: 4.32 MIL/uL (ref 4.22–5.81)
RDW: 13 % (ref 11.5–15.5)
WBC: 4.9 10*3/uL (ref 4.0–10.5)
nRBC: 0 % (ref 0.0–0.2)

## 2023-04-01 LAB — BLOOD GAS, ARTERIAL
Acid-Base Excess: 12.5 mmol/L — ABNORMAL HIGH (ref 0.0–2.0)
Bicarbonate: 39.3 mmol/L — ABNORMAL HIGH (ref 20.0–28.0)
O2 Saturation: 97.4 %
Patient temperature: 36.8
pCO2 arterial: 61 mm[Hg] — ABNORMAL HIGH (ref 32–48)
pH, Arterial: 7.41 (ref 7.35–7.45)
pO2, Arterial: 75 mm[Hg] — ABNORMAL LOW (ref 83–108)

## 2023-04-01 LAB — BASIC METABOLIC PANEL
Anion gap: 11 (ref 5–15)
BUN: 5 mg/dL — ABNORMAL LOW (ref 8–23)
CO2: 35 mmol/L — ABNORMAL HIGH (ref 22–32)
Calcium: 9.3 mg/dL (ref 8.9–10.3)
Chloride: 89 mmol/L — ABNORMAL LOW (ref 98–111)
Creatinine, Ser: 0.47 mg/dL — ABNORMAL LOW (ref 0.61–1.24)
GFR, Estimated: >60 mL/min (ref >60)
Glucose, Bld: 125 mg/dL — ABNORMAL HIGH (ref 70–99)
Potassium: 4.4 mmol/L (ref 3.5–5.1)
Sodium: 135 mmol/L (ref 135–145)

## 2023-04-01 LAB — PROCALCITONIN: Procalcitonin: <0.1 ng/mL

## 2023-04-01 LAB — C-REACTIVE PROTEIN: CRP: 1.3 mg/dL — ABNORMAL HIGH (ref <1.0)

## 2023-04-01 LAB — MAGNESIUM: Magnesium: 2.3 mg/dL (ref 1.7–2.4)

## 2023-04-01 MED ORDER — TRAMADOL HCL 50 MG PO TABS: 50.0000 mg | ORAL_TABLET | Freq: Four times a day (QID) | ORAL | Status: DC | PRN

## 2023-04-01 MED ORDER — IPRATROPIUM-ALBUTEROL 0.5-2.5 (3) MG/3ML IN SOLN
3.0000 mL | Freq: Two times a day (BID) | RESPIRATORY_TRACT | Status: DC
  Administered 2023-04-01 – 2023-04-04 (×6): 3 mL via RESPIRATORY_TRACT
  Filled 2023-04-01 (×6): qty 3

## 2023-04-01 NOTE — Progress Notes (Signed)
PROGRESS NOTE                                                                                                                                                                                                             Patient Demographics:    [name], is a 68 y.o. male, DOB - 01-13-1956, ZOX:[id number]  Outpatient Primary MD for the patient is Fleet Contras, MD    LOS - 2  Admit date - 03/29/2023    Chief Complaint  Patient presents with   Leg Pain       Brief Narrative (HPI from H&P)   68 year old male, with past medical history significant for polysubstance abuse (alcohol, cocaine and tobacco use), noncompliance, hypertension, hyperlipidemia, peripheral artery disease status post left AKA, COPD on 2 to 3 L of oxygen. Patient was admitted with worsening of the pain involving the left AKA stump. There are also concerns for drainage.  Was admitted for further workup for stump site infection.   Subjective:    [name] today has, No headache, No chest pain, No abdominal pain - No Nausea, No new weakness tingling or numbness, no SOB, mild L stump pian- chronic   Assessment  & Plan :    Acute on chronic hypercapnic hypoxic respiratory failure , H/O COPD - Probably has baseline CO2 retention but this time has significantly elevated CO2 to 93 with acidosis.  Was given BiPAP after which his hypercapnia has improved, ABG now compensated, continue supplemental oxygen, chest x-ray nonacute.  Continue bronchodilators and monitor.  Currently no wheezing.  Holding off on steroids.  Demise sedating medications.    Acute on chronic left AKA stump pain/infected left AKA stump:  Some patients of cellulitis at the time of admission, MRI nonacute ruling out osteomyelitis, seen by Dr. Lajoyce Corners, IV antibiotics for 3 to 4 days thereafter transition to oral antibiotics with outpatient orthopedics follow-up.   Hyperkalemia -   treated  Hypertension  - Blood pressure is controlled.     PAD s/p prior left AKA - HLD - -Noncompliant. Orthopedic team has been consulted.   Polysubstance abuse (smoking, alcohol, cocaine)  - Counseled to quit, Nicotine patch offered. CIWA protocol ordered with as needed Ativan   Noncompliance to medications - Encouraged compliance   Impaired mobility - Does not use a prosthesis.  Uses an Mining engineer wheelchair  to get around.  Supportive care, PT if needed.        Condition - Fair  Family Communication  :  None  Code Status :  Full  Consults  :  Ortho - Dr. Lajoyce Corners  PUD Prophylaxis :     Procedures  :     MRI - 1. Postsurgical changes from above knee amputation of the left lower extremity. Redemonstration of increased T2 signal within the anterior aspect of the distal femur. This is less pronounced when compared with the prior MRI from 2022. Given stability of this finding, this is felt to be unlikely secondary to acute osteomyelitis. 2. Soft tissue thinning over the distal stump with residual hardware closely approximating the skin surface. No overlying soft tissue edema or fluid collection. 3. Intramuscular edema, more pronounced in the anterior compartment which may be related to denervation or myositis. 4. 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      Disposition Plan  :    Status is: Inpatient   DVT Prophylaxis  :    enoxaparin (LOVENOX) injection 40 mg Start: 03/30/23 0945    Lab Results  Component Value Date   PLT 222 04/01/2023    Diet :  Diet Order             Diet Heart Room service appropriate? Yes; Fluid consistency: Thin  Diet effective now                    Inpatient Medications  Scheduled Meds:  budesonide  0.5 mg Nebulization BID   enoxaparin (LOVENOX) injection  40 mg Subcutaneous Q24H   folic acid  1 mg Oral Daily   guaiFENesin  600 mg Oral BID   ipratropium-albuterol  3 mL Nebulization Q6H    multivitamin with minerals  1 tablet Oral Daily   thiamine  100 mg Intravenous Daily   Continuous Infusions:  piperacillin-tazobactam (ZOSYN)  IV 3.375 g (04/01/23 0636)   vancomycin 750 mg (03/31/23 2145)   PRN Meds:.acetaminophen **OR** acetaminophen, albuterol, bisacodyl, hydrALAZINE, LORazepam **OR** LORazepam, oxyCODONE, polyethylene glycol    Objective:   Vitals:   04/01/23 0146 04/01/23 0800 04/01/23 0811 04/01/23 0837  BP:  101/73    Pulse: (!) 101  68   Resp: (!) 25 (!) 26 (!) 21   Temp:    (!) 97.4 F (36.3 C)  TempSrc:    Oral  SpO2: 98%  96%   Weight:      Height:        Wt Readings from Last 3 Encounters:  03/31/23 45.9 kg  11/20/22 90.7 kg  09/27/22 90.7 kg     Intake/Output Summary (Last 24 hours) at 04/01/2023 0959 Last data filed at 04/01/2023 0600 Gross per 24 hour  Intake --  Output 1250 ml  Net -1250 ml     Physical Exam  Awake Alert, No new F.N deficits, Normal affect [place].AT,PERRAL Supple Neck, No JVD,   Symmetrical Chest wall movement, Good air movement bilaterally, CTAB RRR,No Gallops,Rubs or new Murmurs,  +ve B.Sounds, Abd Soft, No tenderness,   No Cyanosis, L. AKA stump under bandage    RN pressure injury documentation:      Data Review:    Recent Labs  Lab 03/29/23 1745 03/30/23 0812 03/31/23 0643 04/01/23 0729  WBC 11.4*  --  5.6 4.9  HGB 13.3 13.3 11.9* 12.5*  HCT 44.3 39.0 39.2 41.2  PLT 212  --  192 222  MCV  99.1  --  96.8 95.4  MCH 29.8  --  29.4 28.9  MCHC 30.0  --  30.4 30.3  RDW 12.9  --  13.0 13.0  LYMPHSABS 0.9  --   --  1.0  MONOABS 0.8  --   --  0.5  EOSABS 0.0  --   --  0.0  BASOSABS 0.0  --   --  0.0    Recent Labs  Lab 03/29/23 1637 03/29/23 1745 03/30/23 0150 03/30/23 0812 03/31/23 0643 04/01/23 0729  NA 139  --   --  135 135 135  K 5.4*  --   --  4.5 4.4 4.4  CL 90*  --   --   --  87* 89*  CO2 30  --   --   --  35* 35*  ANIONGAP 19*  --   --   --  13 11  GLUCOSE 96  --   --   --  99 125*   BUN 23  --   --   --  10 5*  CREATININE 0.53*  --   --   --  0.44* 0.47*  CRP  --  13.3*  --   --   --   --   BNP  --   --  66.6  --   --   --   MG  --   --   --   --   --  2.3  CALCIUM 9.0  --   --   --  8.8* 9.3      Recent Labs  Lab 03/29/23 1637 03/29/23 1745 03/30/23 0150 03/31/23 0643 04/01/23 0729  CRP  --  13.3*  --   --   --   BNP  --   --  66.6  --   --   MG  --   --   --   --  2.3  CALCIUM 9.0  --   --  8.8* 9.3    --------------------------------------------------------------------------------------------------------------- Lab Results  Component Value Date   CHOL 169 10/14/2022   HDL 60 10/14/2022   LDLCALC 94 10/14/2022   TRIG 68 10/14/2022   CHOLHDL 2.8 10/14/2022    Lab Results  Component Value Date   HGBA1C 6.3 (H) 07/14/2019   No results for input(s): "TSH", "T4TOTAL", "FREET4", "T3FREE", "THYROIDAB" in the last 72 hours. No results for input(s): "VITAMINB12", "FOLATE", "FERRITIN", "TIBC", "IRON", "RETICCTPCT" in the last 72 hours. ------------------------------------------------------------------------------------------------------------------ Cardiac Enzymes No results for input(s): "CKMB", "TROPONINI", "MYOGLOBIN" in the last 168 hours.  Invalid input(s): "CK"  Micro Results Recent Results (from the past 240 hours)  Culture, blood (Routine X 2) w Reflex to ID Panel     Status: None (Preliminary result)   Collection Time: 03/31/23  8:45 AM   Specimen: BLOOD  Result Value Ref Range Status   Specimen Description BLOOD BLOOD LEFT ARM  Final   Special Requests   Final    BOTTLES DRAWN AEROBIC AND ANAEROBIC Blood Culture results may not be optimal due to an inadequate volume of blood received in culture bottles   Culture   Final    NO GROWTH < 24 HOURS Performed at [facility], [address], [place], [place] [address]    Report Status PENDING  Incomplete  Culture, blood (Routine X 2) w Reflex to ID Panel     Status: None (Preliminary  result)   Collection Time: 03/31/23  8:50 AM   Specimen: BLOOD  Result  Value Ref Range Status   Specimen Description BLOOD BLOOD RIGHT ARM  Final   Special Requests   Final    AEROBIC BOTTLE ONLY Blood Culture results may not be optimal due to an inadequate volume of blood received in culture bottles   Culture   Final    NO GROWTH < 24 HOURS Performed at [facility], [address] [place], [place] [address]    Report Status PENDING  Incomplete    Radiology Report DG Chest Port 1 View Result Date: 04/01/2023 CLINICAL DATA:  Shortness of breath.  Hypertension. EXAM: PORTABLE CHEST 1 VIEW COMPARISON:  03/30/2023 FINDINGS: Heart size is normal. No pleural fluid, interstitial edema or airspace disease. Chronic bronchial wall thickening identified. IMPRESSION: 1. No acute findings. 2. Chronic bronchial wall thickening. Electronically Signed   By: Signa Kell M.D.   On: 04/01/2023 09:34     Signature  -   Susa Raring M.D on 04/01/2023 at 9:59 AM   -  To page go to [email or web address]

## 2023-04-01 NOTE — Plan of Care (Signed)
Dressing change completed on left stump.

## 2023-04-01 NOTE — Progress Notes (Signed)
BBS remain essn clear. Change Duoneb to BID per RT assessment and cont Pulmicort BID.
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
## 2023-04-02 DIAGNOSIS — J9612 Chronic respiratory failure with hypercapnia: Secondary | ICD-10-CM | POA: Diagnosis not present

## 2023-04-02 DIAGNOSIS — J9601 Acute respiratory failure with hypoxia: Secondary | ICD-10-CM | POA: Diagnosis not present

## 2023-04-02 LAB — BASIC METABOLIC PANEL
Anion gap: 9 (ref 5–15)
BUN: 5 mg/dL — ABNORMAL LOW (ref 8–23)
CO2: 34 mmol/L — ABNORMAL HIGH (ref 22–32)
Calcium: 9.1 mg/dL (ref 8.9–10.3)
Chloride: 93 mmol/L — ABNORMAL LOW (ref 98–111)
Creatinine, Ser: 0.45 mg/dL — ABNORMAL LOW (ref 0.61–1.24)
GFR, Estimated: >60 mL/min (ref >60)
Glucose, Bld: 131 mg/dL — ABNORMAL HIGH (ref 70–99)
Potassium: 4.4 mmol/L (ref 3.5–5.1)
Sodium: 136 mmol/L (ref 135–145)

## 2023-04-02 LAB — CBC WITH DIFFERENTIAL/PLATELET
Abs Immature Granulocytes: 0.05 10*3/uL (ref 0.00–0.07)
Basophils Absolute: 0 10*3/uL (ref 0.0–0.1)
Basophils Relative: 0 %
Eosinophils Absolute: 0.1 10*3/uL (ref 0.0–0.5)
Eosinophils Relative: 1 %
HCT: 33.9 % — ABNORMAL LOW (ref 39.0–52.0)
Hemoglobin: 10.7 g/dL — ABNORMAL LOW (ref 13.0–17.0)
Immature Granulocytes: 1 %
Lymphocytes Relative: 15 %
Lymphs Abs: 0.7 10*3/uL (ref 0.7–4.0)
MCH: 29.7 pg (ref 26.0–34.0)
MCHC: 31.6 g/dL (ref 30.0–36.0)
MCV: 94.2 fL (ref 80.0–100.0)
Monocytes Absolute: 0.5 10*3/uL (ref 0.1–1.0)
Monocytes Relative: 10 %
Neutro Abs: 3.7 10*3/uL (ref 1.7–7.7)
Neutrophils Relative %: 73 %
Platelets: 243 10*3/uL (ref 150–400)
RBC: 3.6 MIL/uL — ABNORMAL LOW (ref 4.22–5.81)
RDW: 13.2 % (ref 11.5–15.5)
WBC: 5 10*3/uL (ref 4.0–10.5)
nRBC: 0 % (ref 0.0–0.2)

## 2023-04-02 LAB — MAGNESIUM: Magnesium: 2.2 mg/dL (ref 1.7–2.4)

## 2023-04-02 LAB — PROCALCITONIN: Procalcitonin: <0.1 ng/mL

## 2023-04-02 LAB — C-REACTIVE PROTEIN: CRP: 0.9 mg/dL (ref <1.0)

## 2023-04-02 LAB — PHOSPHORUS: Phosphorus: 2.3 mg/dL — ABNORMAL LOW (ref 2.5–4.6)

## 2023-04-02 LAB — BRAIN NATRIURETIC PEPTIDE: B Natriuretic Peptide: 25.6 pg/mL (ref 0.0–100.0)

## 2023-04-02 MED ORDER — SODIUM PHOSPHATES 45 MMOLE/15ML IV SOLN
30.0000 mmol | Freq: Once | INTRAVENOUS | Status: AC
  Administered 2023-04-02: 30 mmol via INTRAVENOUS
  Filled 2023-04-02: qty 10

## 2023-04-02 NOTE — Plan of Care (Signed)
  Problem: Nutrition: Goal: Adequate nutrition will be maintained Outcome: Progressing   Problem: Coping: Goal: Level of anxiety will decrease Outcome: Progressing   Problem: Elimination: Goal: Will not experience complications related to bowel motility Outcome: Progressing   Problem: Elimination: Goal: Will not experience complications related to urinary retention Outcome: Progressing   Problem: Pain Managment: Goal: General experience of comfort will improve and/or be controlled Outcome: Progressing   Problem: Safety: Goal: Ability to remain free from injury will improve Outcome: Progressing   Problem: Skin Integrity: Goal: Risk for impaired skin integrity will decrease Outcome: Progressing

## 2023-04-02 NOTE — Progress Notes (Signed)
PROGRESS NOTE                                                                                                                                                                                                             Patient Demographics:    [name], is a 68 y.o. male, DOB - [birth year], AVW:[id number]  Outpatient Primary MD for the patient is Fleet Contras, MD    LOS - 3  Admit date - 03/29/2023    Chief Complaint  Patient presents with   Leg Pain       Brief Narrative (HPI from H&P)   68 year old male, with past medical history significant for polysubstance abuse (alcohol, cocaine and tobacco use), noncompliance, hypertension, hyperlipidemia, peripheral artery disease status post left AKA, COPD on 2 to 3 L of oxygen. Patient was admitted with worsening of the pain involving the left AKA stump. There are also concerns for drainage.  Was admitted for further workup for stump site infection.   Subjective:   Patient in bed, appears comfortable, denies any headache, no fever, no chest pain or pressure, no shortness of breath , no abdominal pain. No focal weakness.   Assessment  & Plan :    Acute on chronic hypercapnic hypoxic respiratory failure , H/O COPD - Probably has baseline CO2 retention but this time has significantly elevated CO2 to 93 with acidosis.  Was given BiPAP after which his hypercapnia has improved, ABG now compensated, continue supplemental oxygen, chest x-ray nonacute.  Continue bronchodilators and monitor.  Currently no wheezing.  Holding off on steroids.  Minimize sedating medications.    Acute on chronic left AKA stump pain/infected left AKA stump:  Some patients of cellulitis at the time of admission, MRI nonacute ruling out osteomyelitis, seen by Dr. Lajoyce Corners, IV antibiotics for 3 to 4 days thereafter transition to oral antibiotics with outpatient orthopedics follow-up.   Hypophosphatemia.  Replace.     Hyperkalemia -  treated  Hypertension  - Blood pressure is controlled.     PAD s/p prior left AKA - HLD - Noncompliant. Orthopedic team has been consulted.   Polysubstance abuse (smoking, alcohol, cocaine)  - Counseled to quit, Nicotine patch offered. CIWA protocol ordered with as needed Ativan   Noncompliance to medications - Encouraged compliance   Impaired mobility - Does not use a prosthesis.  Uses  an electric wheelchair to get around.  Supportive care, PT if needed.        Condition - Fair  Family Communication  :  None  Code Status :  Full  Consults  :  Ortho - Dr. Lajoyce Corners  PUD Prophylaxis :     Procedures  :     MRI - 1. Postsurgical changes from above knee amputation of the left lower extremity. Redemonstration of increased T2 signal within the anterior aspect of the distal femur. This is less pronounced when compared with the prior MRI from 2022. Given stability of this finding, this is felt to be unlikely secondary to acute osteomyelitis. 2. Soft tissue thinning over the distal stump with residual hardware closely approximating the skin surface. No overlying soft tissue edema or fluid collection. 3. Intramuscular edema, more pronounced in the anterior compartment which may be related to denervation or myositis. 4. 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      Disposition Plan  :    Status is: Inpatient   DVT Prophylaxis  :    enoxaparin (LOVENOX) injection 40 mg Start: 03/30/23 0945    Lab Results  Component Value Date   PLT 243 04/02/2023    Diet :  Diet Order             Diet Heart Room service appropriate? Yes; Fluid consistency: Thin  Diet effective now                    Inpatient Medications  Scheduled Meds:  budesonide  0.5 mg Nebulization BID   enoxaparin (LOVENOX) injection  40 mg Subcutaneous Q24H   folic acid  1 mg Oral Daily   guaiFENesin  600 mg Oral BID   ipratropium-albuterol  3 mL  Nebulization BID   multivitamin with minerals  1 tablet Oral Daily   thiamine  100 mg Intravenous Daily   Continuous Infusions:  piperacillin-tazobactam (ZOSYN)  IV 3.375 g (04/02/23 0543)   sodium PHOSPHATE IVPB (in mmol)     vancomycin Stopped (04/01/23 2153)   PRN Meds:.acetaminophen **OR** acetaminophen, albuterol, bisacodyl, hydrALAZINE, LORazepam **OR** LORazepam, polyethylene glycol, traMADol    Objective:   Vitals:   04/02/23 0000 04/02/23 0300 04/02/23 0400 04/02/23 0934  BP: (!) 116/58  100/70 97/65  Pulse: (!) 50 63 78 70  Resp: 20  19 20   Temp: 97.6 F (36.4 C)  97.8 F (36.6 C) 98.7 F (37.1 C)  TempSrc: Oral  Oral Oral  SpO2: 94% 98% 96% 96%  Weight:      Height:        Wt Readings from Last 3 Encounters:  03/31/23 45.9 kg  11/20/22 90.7 kg  09/27/22 90.7 kg     Intake/Output Summary (Last 24 hours) at 04/02/2023 1010 Last data filed at 04/02/2023 0329 Gross per 24 hour  Intake 770 ml  Output 900 ml  Net -130 ml     Physical Exam  Awake Alert, No new F.N deficits, Normal affect [place].AT,PERRAL Supple Neck, No JVD,   Symmetrical Chest wall movement, Good air movement bilaterally, CTAB RRR,No Gallops,Rubs or new Murmurs,  +ve B.Sounds, Abd Soft, No tenderness,   No Cyanosis, L. AKA stump under bandage       Data Review:    Recent Labs  Lab 03/29/23 1745 03/30/23 0812 03/31/23 0643 04/01/23 0729 04/02/23 0537  WBC 11.4*  --  5.6 4.9 5.0  HGB 13.3 13.3 11.9* 12.5* 10.7*  HCT 44.3  39.0 39.2 41.2 33.9*  PLT 212  --  192 222 243  MCV 99.1  --  96.8 95.4 94.2  MCH 29.8  --  29.4 28.9 29.7  MCHC 30.0  --  30.4 30.3 31.6  RDW 12.9  --  13.0 13.0 13.2  LYMPHSABS 0.9  --   --  1.0 0.7  MONOABS 0.8  --   --  0.5 0.5  EOSABS 0.0  --   --  0.0 0.1  BASOSABS 0.0  --   --  0.0 0.0    Recent Labs  Lab 03/29/23 1637 03/29/23 1745 03/30/23 0150 03/30/23 0812 03/31/23 0643 04/01/23 0729 04/01/23 1411 04/02/23 0537  NA 139  --   --  135  135 135  --  136  K 5.4*  --   --  4.5 4.4 4.4  --  4.4  CL 90*  --   --   --  87* 89*  --  93*  CO2 30  --   --   --  35* 35*  --  34*  ANIONGAP 19*  --   --   --  13 11  --  9  GLUCOSE 96  --   --   --  99 125*  --  131*  BUN 23  --   --   --  10 5*  --  5*  CREATININE 0.53*  --   --   --  0.44* 0.47*  --  0.45*  CRP  --  13.3*  --   --   --   --  1.3* 0.9  PROCALCITON  --   --   --   --   --  <0.10  --  <0.10  BNP  --   --  66.6  --   --   --   --  25.6  MG  --   --   --   --   --  2.3  --  2.2  PHOS  --   --   --   --   --   --   --  2.3*  CALCIUM 9.0  --   --   --  8.8* 9.3  --  9.1      Recent Labs  Lab 03/29/23 1637 03/29/23 1745 03/30/23 0150 03/31/23 0643 04/01/23 0729 04/01/23 1411 04/02/23 0537  CRP  --  13.3*  --   --   --  1.3* 0.9  PROCALCITON  --   --   --   --  <0.10  --  <0.10  BNP  --   --  66.6  --   --   --  25.6  MG  --   --   --   --  2.3  --  2.2  CALCIUM 9.0  --   --  8.8* 9.3  --  9.1    --------------------------------------------------------------------------------------------------------------- Lab Results  Component Value Date   CHOL 169 10/14/2022   HDL 60 10/14/2022   LDLCALC 94 10/14/2022   TRIG 68 10/14/2022   CHOLHDL 2.8 10/14/2022    Lab Results  Component Value Date   HGBA1C 6.3 (H) 07/14/2019   No results for input(s): "TSH", "T4TOTAL", "FREET4", "T3FREE", "THYROIDAB" in the last 72 hours. No results for input(s): "VITAMINB12", "FOLATE", "FERRITIN", "TIBC", "IRON", "RETICCTPCT" in the last 72 hours. ------------------------------------------------------------------------------------------------------------------ Cardiac Enzymes No results for input(s): "CKMB", "TROPONINI", "MYOGLOBIN" in the last 168 hours.  Invalid input(s): "CK"  Micro Results  Recent Results (from the past 240 hours)  Culture, blood (Routine X 2) w Reflex to ID Panel     Status: None (Preliminary result)   Collection Time: 03/31/23  8:45 AM   Specimen:  BLOOD  Result Value Ref Range Status   Specimen Description BLOOD BLOOD LEFT ARM  Final   Special Requests   Final    BOTTLES DRAWN AEROBIC AND ANAEROBIC Blood Culture results may not be optimal due to an inadequate volume of blood received in culture bottles   Culture   Final    NO GROWTH 2 DAYS Performed at [facility], [address] [place], [place] [address]    Report Status PENDING  Incomplete  Culture, blood (Routine X 2) w Reflex to ID Panel     Status: None (Preliminary result)   Collection Time: 03/31/23  8:50 AM   Specimen: BLOOD  Result Value Ref Range Status   Specimen Description BLOOD BLOOD RIGHT ARM  Final   Special Requests   Final    AEROBIC BOTTLE ONLY Blood Culture results may not be optimal due to an inadequate volume of blood received in culture bottles   Culture   Final    NO GROWTH 2 DAYS Performed at [facility], [address], [place], [place] [address]    Report Status PENDING  Incomplete    Radiology Report DG Chest Port 1 View Result Date: 04/01/2023 CLINICAL DATA:  Shortness of breath.  Hypertension. EXAM: PORTABLE CHEST 1 VIEW COMPARISON:  03/30/2023 FINDINGS: Heart size is normal. No pleural fluid, interstitial edema or airspace disease. Chronic bronchial wall thickening identified. IMPRESSION: 1. No acute findings. 2. Chronic bronchial wall thickening. Electronically Signed   By: Signa Kell M.D.   On: 04/01/2023 09:34     Signature  -   Susa Raring M.D on 04/02/2023 at 10:10 AM   -  To page go to [email or web address]

## 2023-04-03 DIAGNOSIS — J9612 Chronic respiratory failure with hypercapnia: Secondary | ICD-10-CM | POA: Diagnosis not present

## 2023-04-03 DIAGNOSIS — J9601 Acute respiratory failure with hypoxia: Secondary | ICD-10-CM | POA: Diagnosis not present

## 2023-04-03 LAB — BASIC METABOLIC PANEL
Anion gap: 8 (ref 5–15)
BUN: 8 mg/dL (ref 8–23)
CO2: 31 mmol/L (ref 22–32)
Calcium: 8.8 mg/dL — ABNORMAL LOW (ref 8.9–10.3)
Chloride: 96 mmol/L — ABNORMAL LOW (ref 98–111)
Creatinine, Ser: 0.57 mg/dL — ABNORMAL LOW (ref 0.61–1.24)
GFR, Estimated: >60 mL/min (ref >60)
Glucose, Bld: 159 mg/dL — ABNORMAL HIGH (ref 70–99)
Potassium: 4.4 mmol/L (ref 3.5–5.1)
Sodium: 135 mmol/L (ref 135–145)

## 2023-04-03 LAB — PHOSPHORUS: Phosphorus: 3.4 mg/dL (ref 2.5–4.6)

## 2023-04-03 NOTE — Progress Notes (Signed)
04/03/23 1100  Spiritual Encounters  Type of Visit Initial  Care provided to: Pt and family  Referral source Patient request  Reason for visit Advance directives  OnCall Visit No   Chaplain responded to a consult request for Advance Directive education.  Chaplain provided the Advance Directive packet as well as education on Advance Directives-documents an individual completes to communicate their health care directions in advance of a time when they may need them. Chaplain informed pt the documents which may be completed here in the hospital are the Living Will and Health Care Power of [place].  Chaplain informed that the Health Care Power of [place] is a legal document in which an individual names another person, their Health Care Agent, to make health care decisions when the individual is not able to make them for themselves. The Health Care Agent's function can be temporary or permanent depending on the pt's ability to make and communicate those decisions independently. Chaplain informed pt in the absence of a Health Care Power of [place], the state of [place] directs health care providers to look to the following individuals in the order listed: legal guardian; an attorney?in?fact under a general power of attorney (POA) if that POA includes the right to make health care decisions; a husband or wife; a majority of parents and adult children; a majority of adult brothers and sisters; or an individual who has an established relationship with you, who is acting in good faith and who can convey your wishes.  If none of these person are available or willing to make medical decisions on a patient's behalf, the law allows the patient's doctor to make decisions for them as long as another doctor agrees with those decisions.  Chaplain also informed the patient that the Health Care agent has no decision-making authority over any affairs other than those related to his or her medical care.  The  chaplain further educated the pt that a Living Will is a legal document that allows an individual to state his or her desire not to receive life-prolonging measures in the event that they have a condition that is incurable and will result in their death in a short period of time; they are unconscious, and doctors are confident that they will not regain consciousness; and/or they have advanced dementia or other substantial and irreversible loss of mental function. The chaplain informed pt that life-prolonging measures are medical treatments that would only serve to postpone death, including breathing machines, kidney dialysis, antibiotics, artificial nutrition and hydration (tube feeding), and similar forms of treatment and that if an individual is able to express their wishes, they may also make them known without the use of a Living Will, but in the event that an individual is not able to express their wishes themselves, a Living Will allows medical providers and the pt's family and friends ensure that they are not making decisions on the pt's behalf, but rather serving as the pt's voice to convey decisions the pt has already made.  The patient is aware that the decision to create an advance directive is theirs alone and they may chose not to complete the documents or may chose to complete one portion or both.  The patient was informed that they can revoke the documents at any time by striking through them and writing void or by completing new documents, but that it is also advisable that the individual verbally notify interested parties that their wishes have changed.  They are also aware that  the document must be signed in the presence of a notary public and two witnesses and that this can be done while the patient is still admitted to the hospital or after discharge in the community. If they decide to complete Advance Directives after being discharged from the hospital, they have been advised to notify all  interested parties and to provide those documents to their physicians and loved ones in addition to bringing them to the hospital in the event of another hospitalization.  The chaplain informed the pt that if they desire to proceed with completing Advance Directive Documentation while they are still admitted, notary services are typically available at [facility] between the hours of 1:00 and 3:30 Monday-Thursday.    When the patient is ready to have these documents completed, the patient should request that their nurse place a spiritual care consult and indicate that the patient is ready to have their advance directives notarized so that arrangements for witnesses and notary public can be made.  Please page spiritual care if the patient desires further education or has questions.     M.Kubra Delano Metz Resident [phone]

## 2023-04-03 NOTE — Care Management Important Message (Signed)
Important Message  Patient Details  Name: [name] MRN: [id number] Date of Birth: [birth year]   Important Message Given:  Yes - Medicare IM     Dorena Bodo 04/03/2023, 3:28 PM

## 2023-04-03 NOTE — Plan of Care (Signed)
   Problem: Activity: Goal: Risk for activity intolerance will decrease Outcome: Progressing   Problem: Nutrition: Goal: Adequate nutrition will be maintained Outcome: Progressing   Problem: Coping: Goal: Level of anxiety will decrease Outcome: Progressing   Problem: Safety: Goal: Ability to remain free from injury will improve Outcome: Progressing   Problem: Skin Integrity: Goal: Risk for impaired skin integrity will decrease Outcome: Progressing

## 2023-04-03 NOTE — Progress Notes (Signed)
PROGRESS NOTE                                                                                                                                                                                                             Patient Demographics:    [name], is a 68 y.o. male, DOB - [birth year], GNF:[id number]  Outpatient Primary MD for the patient is Fleet Contras, MD    LOS - 4  Admit date - 03/29/2023    Chief Complaint  Patient presents with   Leg Pain       Brief Narrative (HPI from H&P)   68 year old male, with past medical history significant for polysubstance abuse (alcohol, cocaine and tobacco use), noncompliance, hypertension, hyperlipidemia, peripheral artery disease status post left AKA, COPD on 2 to 3 L of oxygen. Patient was admitted with worsening of the pain involving the left AKA stump. There are also concerns for drainage.  Was admitted for further workup for stump site infection.   Subjective:   Patient in bed, appears comfortable, denies any headache, no fever, no chest pain or pressure, no shortness of breath , no abdominal pain. No new focal weakness.  Left AKA stump site discomfort improved, overall feeling better today.   Assessment  & Plan :    Acute on chronic hypercapnic hypoxic respiratory failure , H/O COPD - Probably has baseline CO2 retention but this time has significantly elevated CO2 to 93 with acidosis.  Was given BiPAP after which his hypercapnia has improved, ABG now compensated, continue supplemental oxygen, chest x-ray nonacute.  Continue bronchodilators and monitor.  Currently no wheezing.  Holding off on steroids.  Minimize sedating medications.    Acute on chronic left AKA stump pain/infected left AKA stump:  Some patients of cellulitis at the time of admission, MRI nonacute ruling out osteomyelitis, seen by Dr. Lajoyce Corners, IV antibiotics for 3 to 4 days thereafter transition to oral  antibiotics with outpatient orthopedics follow-up.   Hypophosphatemia.  Replace.    Hyperkalemia -  treated  Hypertension  - Blood pressure is controlled.     PAD s/p prior left AKA - HLD - Noncompliant. Orthopedic team has been consulted.   Polysubstance abuse (smoking, alcohol, cocaine)  - Counseled to quit, Nicotine patch offered. CIWA protocol ordered with as needed Ativan   Noncompliance to medications - Encouraged compliance  Impaired mobility - Does not use a prosthesis.  Uses an electric wheelchair to get around.  Supportive care, PT if needed.        Condition - Fair  Family Communication  :  None  Code Status :  Full  Consults  :  Ortho - Dr. Lajoyce Corners  PUD Prophylaxis :     Procedures  :     MRI - 1. Postsurgical changes from above knee amputation of the left lower extremity. Redemonstration of increased T2 signal within the anterior aspect of the distal femur. This is less pronounced when compared with the prior MRI from 2022. Given stability of this finding, this is felt to be unlikely secondary to acute osteomyelitis. 2. Soft tissue thinning over the distal stump with residual hardware closely approximating the skin surface. No overlying soft tissue edema or fluid collection. 3. Intramuscular edema, more pronounced in the anterior compartment which may be related to denervation or myositis. 4. 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      Disposition Plan  :    Status is: Inpatient   DVT Prophylaxis  :    enoxaparin (LOVENOX) injection 40 mg Start: 03/30/23 0945    Lab Results  Component Value Date   PLT 243 04/02/2023    Diet :  Diet Order             Diet Heart Room service appropriate? No; Fluid consistency: Thin  Diet effective now                    Inpatient Medications  Scheduled Meds:  budesonide  0.5 mg Nebulization BID   enoxaparin (LOVENOX) injection  40 mg Subcutaneous Q24H   folic  acid  1 mg Oral Daily   guaiFENesin  600 mg Oral BID   ipratropium-albuterol  3 mL Nebulization BID   multivitamin with minerals  1 tablet Oral Daily   thiamine  100 mg Intravenous Daily   Continuous Infusions:  piperacillin-tazobactam (ZOSYN)  IV 3.375 g (04/03/23 0512)   vancomycin Stopped (04/02/23 2115)   PRN Meds:.acetaminophen **OR** acetaminophen, albuterol, bisacodyl, hydrALAZINE, polyethylene glycol, traMADol    Objective:   Vitals:   04/02/23 1940 04/02/23 2059 04/03/23 0000 04/03/23 0400  BP: (!) 104/55  98/66 92/65  Pulse: 88 79 68 64  Resp: 20 18 18 18   Temp: 98.6 F (37 C)  98 F (36.7 C) 97.9 F (36.6 C)  TempSrc: Oral  Oral Oral  SpO2: 98% 100% 99% 98%  Weight:      Height:        Wt Readings from Last 3 Encounters:  03/31/23 45.9 kg  11/20/22 90.7 kg  09/27/22 90.7 kg     Intake/Output Summary (Last 24 hours) at 04/03/2023 0741 Last data filed at 04/03/2023 6045 Gross per 24 hour  Intake 779.91 ml  Output 500 ml  Net 279.91 ml     Physical Exam  Awake Alert, No new F.N deficits, Normal affect [place].AT,PERRAL Supple Neck, No JVD,   Symmetrical Chest wall movement, Good air movement bilaterally, CTAB RRR,No Gallops,Rubs or new Murmurs,  +ve B.Sounds, Abd Soft, No tenderness,   No Cyanosis, L. AKA stump under bandage       Data Review:    Recent Labs  Lab 03/29/23 1745 03/30/23 0812 03/31/23 0643 04/01/23 0729 04/02/23 0537  WBC 11.4*  --  5.6 4.9 5.0  HGB 13.3 13.3 11.9* 12.5* 10.7*  HCT 44.3 39.0 39.2 41.2  33.9*  PLT 212  --  192 222 243  MCV 99.1  --  96.8 95.4 94.2  MCH 29.8  --  29.4 28.9 29.7  MCHC 30.0  --  30.4 30.3 31.6  RDW 12.9  --  13.0 13.0 13.2  LYMPHSABS 0.9  --   --  1.0 0.7  MONOABS 0.8  --   --  0.5 0.5  EOSABS 0.0  --   --  0.0 0.1  BASOSABS 0.0  --   --  0.0 0.0    Recent Labs  Lab 03/29/23 1637 03/29/23 1745 03/30/23 0150 03/30/23 0812 03/31/23 0643 04/01/23 0729 04/01/23 1411 04/02/23 0537  04/03/23 0502  NA 139  --   --  135 135 135  --  136 135  K 5.4*  --   --  4.5 4.4 4.4  --  4.4 4.4  CL 90*  --   --   --  87* 89*  --  93* 96*  CO2 30  --   --   --  35* 35*  --  34* 31  ANIONGAP 19*  --   --   --  13 11  --  9 8  GLUCOSE 96  --   --   --  99 125*  --  131* 159*  BUN 23  --   --   --  10 5*  --  5* 8  CREATININE 0.53*  --   --   --  0.44* 0.47*  --  0.45* 0.57*  CRP  --  13.3*  --   --   --   --  1.3* 0.9  --   PROCALCITON  --   --   --   --   --  <0.10  --  <0.10  --   BNP  --   --  66.6  --   --   --   --  25.6  --   MG  --   --   --   --   --  2.3  --  2.2  --   PHOS  --   --   --   --   --   --   --  2.3* 3.4  CALCIUM 9.0  --   --   --  8.8* 9.3  --  9.1 8.8*      Recent Labs  Lab 03/29/23 1637 03/29/23 1745 03/30/23 0150 03/31/23 0643 04/01/23 0729 04/01/23 1411 04/02/23 0537 04/03/23 0502  CRP  --  13.3*  --   --   --  1.3* 0.9  --   PROCALCITON  --   --   --   --  <0.10  --  <0.10  --   BNP  --   --  66.6  --   --   --  25.6  --   MG  --   --   --   --  2.3  --  2.2  --   CALCIUM 9.0  --   --  8.8* 9.3  --  9.1 8.8*    --------------------------------------------------------------------------------------------------------------- Lab Results  Component Value Date   CHOL 169 10/14/2022   HDL 60 10/14/2022   LDLCALC 94 10/14/2022   TRIG 68 10/14/2022   CHOLHDL 2.8 10/14/2022    Lab Results  Component Value Date   HGBA1C 6.3 (H) 07/14/2019   No results for input(s): "TSH", "T4TOTAL", "FREET4", "T3FREE", "THYROIDAB" in the last 72 hours. No  results for input(s): "VITAMINB12", "FOLATE", "FERRITIN", "TIBC", "IRON", "RETICCTPCT" in the last 72 hours. ------------------------------------------------------------------------------------------------------------------ Cardiac Enzymes No results for input(s): "CKMB", "TROPONINI", "MYOGLOBIN" in the last 168 hours.  Invalid input(s): "CK"  Micro Results Recent Results (from the past 240 hours)   Culture, blood (Routine X 2) w Reflex to ID Panel     Status: None (Preliminary result)   Collection Time: 03/31/23  8:45 AM   Specimen: BLOOD  Result Value Ref Range Status   Specimen Description BLOOD BLOOD LEFT ARM  Final   Special Requests   Final    BOTTLES DRAWN AEROBIC AND ANAEROBIC Blood Culture results may not be optimal due to an inadequate volume of blood received in culture bottles   Culture   Final    NO GROWTH 2 DAYS Performed at [facility], [address], [place], [place] [address]    Report Status PENDING  Incomplete  Culture, blood (Routine X 2) w Reflex to ID Panel     Status: None (Preliminary result)   Collection Time: 03/31/23  8:50 AM   Specimen: BLOOD  Result Value Ref Range Status   Specimen Description BLOOD BLOOD RIGHT ARM  Final   Special Requests   Final    AEROBIC BOTTLE ONLY Blood Culture results may not be optimal due to an inadequate volume of blood received in culture bottles   Culture   Final    NO GROWTH 2 DAYS Performed at [facility], [address] [place], [place] [address]    Report Status PENDING  Incomplete    Radiology Report DG Chest Port 1 View Result Date: 04/01/2023 CLINICAL DATA:  Shortness of breath.  Hypertension. EXAM: PORTABLE CHEST 1 VIEW COMPARISON:  03/30/2023 FINDINGS: Heart size is normal. No pleural fluid, interstitial edema or airspace disease. Chronic bronchial wall thickening identified. IMPRESSION: 1. No acute findings. 2. Chronic bronchial wall thickening. Electronically Signed   By: Signa Kell M.D.   On: 04/01/2023 09:34     Signature  -   Susa Raring M.D on 04/03/2023 at 7:41 AM   -  To page go to [email or web address]

## 2023-04-04 ENCOUNTER — Other Ambulatory Visit (HOSPITAL_COMMUNITY): Payer: Self-pay

## 2023-04-04 ENCOUNTER — Inpatient Hospital Stay (HOSPITAL_COMMUNITY): Payer: 59

## 2023-04-04 DIAGNOSIS — J9612 Chronic respiratory failure with hypercapnia: Secondary | ICD-10-CM | POA: Diagnosis not present

## 2023-04-04 DIAGNOSIS — J9601 Acute respiratory failure with hypoxia: Secondary | ICD-10-CM | POA: Diagnosis not present

## 2023-04-04 MED ORDER — FOLIC ACID 1 MG PO TABS
1.0000 mg | ORAL_TABLET | Freq: Every day | ORAL | 0 refills | Status: DC
  Filled 2023-04-04: qty 30, 30d supply, fill #0

## 2023-04-04 MED ORDER — THIAMINE HCL 100 MG PO TABS
100.0000 mg | ORAL_TABLET | Freq: Every day | ORAL | 0 refills | Status: DC
  Filled 2023-04-04: qty 30, 30d supply, fill #0

## 2023-04-04 MED ORDER — DOXYCYCLINE HYCLATE 100 MG PO TABS
100.0000 mg | ORAL_TABLET | Freq: Two times a day (BID) | ORAL | 0 refills | Status: DC
  Filled 2023-04-04: qty 10, 5d supply, fill #0

## 2023-04-04 NOTE — Progress Notes (Signed)
AVS provided, educational materials discussed and reviewed with patient verbalizing understanding.  Denies questions or concerns.  PIV d/c'd with out complications. Discharged home via [facility]

## 2023-04-04 NOTE — Plan of Care (Signed)
 Adequate for discharge.

## 2023-04-04 NOTE — Progress Notes (Signed)
   04/04/23 0018  BiPAP/CPAP/SIPAP  Reason BIPAP/CPAP not in use Non-compliant (Refused, does not wear at home)

## 2023-04-04 NOTE — TOC Transition Note (Signed)
Transition of Care [facility]) - Discharge Note   Patient Details  Name: [name] MRN: [id number] Date of Birth: [birth year]  Transition of Care [facility]) CM/SW Contact:  Gordy Clement, RN Phone Number: 04/04/2023, 8:55 AM   Clinical Narrative:     Patient will DC to home today. [facility] to transport  Daughter will be at the house Home Health is arranged AVS updated .  No DME has been recommended          Patient Goals and CMS Choice            Discharge Placement                       Discharge Plan and Services Additional resources added to the After Visit Summary for                                       Social Drivers of Health (SDOH) Interventions SDOH Screenings   Food Insecurity: No Food Insecurity (03/31/2023)  Housing: Low Risk  (03/31/2023)  Transportation Needs: No Transportation Needs (03/31/2023)  Utilities: Not At Risk (03/31/2023)  Depression (PHQ2-9): Low Risk  (07/22/2019)  Social Connections: Socially Isolated (03/31/2023)  Tobacco Use: High Risk (03/30/2023)     Readmission Risk Interventions    08/02/2022   12:27 PM  Readmission Risk Prevention Plan  Post Dischage Appt Complete  Medication Screening Complete  Transportation Screening Complete

## 2023-04-04 NOTE — Plan of Care (Signed)
Pt has rested ;quietly throughout the night with no distress noted. Alert and oriented to person and place. On [id number]. Pt is SR on the monitor. Pt voids per urinal. No complaints of pain or SOA. Pt swallowed pill fine but this am when eating some crackers and drinking a soda he started to choke and cough for a couple of minutes and told me it went down the wrong pipe when I asked. Had one large brown BM. Dressing to left stump CDI. No other complaints voiced.     Problem: Education: Goal: Knowledge of General Education information will improve Description: Including pain rating scale, medication(s)/side effects and non-pharmacologic comfort measures Outcome: Progressing   Problem: Health Behavior/Discharge Planning: Goal: Ability to manage health-related needs will improve Outcome: Progressing   Problem: Clinical Measurements: Goal: Ability to maintain clinical measurements within normal limits will improve Outcome: Progressing Goal: Will remain free from infection Outcome: Progressing Goal: Diagnostic test results will improve Outcome: Progressing Goal: Respiratory complications will improve Outcome: Progressing Goal: Cardiovascular complication will be avoided Outcome: Progressing   Problem: Activity: Goal: Risk for activity intolerance will decrease Outcome: Progressing   Problem: Nutrition: Goal: Adequate nutrition will be maintained Outcome: Progressing   Problem: Coping: Goal: Level of anxiety will decrease Outcome: Progressing   Problem: Elimination: Goal: Will not experience complications related to bowel motility Outcome: Progressing Goal: Will not experience complications related to urinary retention Outcome: Progressing   Problem: Pain Managment: Goal: General experience of comfort will improve and/or be controlled Outcome: Progressing   Problem: Safety: Goal: Ability to remain free from injury will improve Outcome: Progressing   Problem: Skin  Integrity: Goal: Risk for impaired skin integrity will decrease Outcome: Progressing

## 2023-04-04 NOTE — Discharge Summary (Addendum)
[name] ZOX:[id number] DOB: 07/16/1955 DOA: 03/29/2023  PCP: Fleet Contras, MD  Admit date: 03/29/2023  Discharge date: 04/04/2023  Admitted From: Home   Disposition:  Home   Recommendations for Outpatient Follow-up:   Follow up with PCP in 1-2 weeks  PCP Please obtain BMP/CBC, 2 view CXR in 1week,  (see Discharge instructions)   PCP Please follow up on the following pending results:    Home Health: PT, RN if qulifies   Equipment/Devices: None  Consultations: Ortho Discharge Condition: Stable    CODE STATUS: Full    Diet Recommendation: Heart Healthy     Chief Complaint  Patient presents with   Leg Pain     Brief history of present illness from the day of admission and additional interim summary    68 year old male, with past medical history significant for polysubstance abuse (alcohol, cocaine and tobacco use), noncompliance, hypertension, hyperlipidemia, peripheral artery disease status post left AKA, COPD on 2 to 3 L of oxygen. Patient was admitted with worsening of the pain involving the left AKA stump. There are also concerns for drainage. Was admitted for further workup for stump site infection.                                                                  Hospital Course   Acute on chronic hypercapnic hypoxic respiratory failure , H/O COPD - Probably has baseline CO2 retention but this time has significantly elevated CO2 to 93 with acidosis.  Was given BiPAP after which his hypercapnia has improved, ABG now compensated, continue supplemental oxygen, chest x-ray nonacute.  Completely symptom-free on 2 L nasal cannula oxygen which he uses at home, no wheezing or crackles.  Continue supportive care at home.  Strictly counseled to quit smoking.  Patient has had intermittent dysphagia at home  and at times here, currently no cough, no shortness of breath or wheezing, lung exam is stable, he will have speech therapy see him prior to discharge, clinically no pneumonia.    Acute on chronic left AKA stump pain/infected left AKA stump:  Some patients of cellulitis at the time of admission, MRI nonacute ruling out osteomyelitis, seen by Dr. Lajoyce Corners Case discussed with him oral antibiotics for 5 more days upon discharge, he was treated here with IV antibiotics, currently no signs of cellulitis or infection, every other day dry dressing, follow-up with Dr. Lajoyce Corners in a week   Hypophosphatemia.  Replaced.     Hyperkalemia -  treated   Hypertension  - Blood pressure is controlled.     PAD s/p prior left AKA - HLD - Noncompliant. Orthopedic team has been consulted.   Polysubstance abuse (smoking, alcohol, cocaine)  - Counseled to quit, Nicotine patch offered. CIWA protocol ordered with as needed Ativan, no DTs here.  Noncompliance to medications - Encouraged compliance   Impaired mobility - Does not use a prosthesis.  Uses an electric wheelchair to get around.  Supportive care, home PT and RN if qualifies.    Discharge diagnosis     Principal Problem:   Acute hypoxic on chronic hypercapnic respiratory failure (HCC) Active Problems:   Skin ulcer of left thigh, limited to breakdown of skin [facility])    Discharge instructions    Discharge Instructions     Diet - low sodium heart healthy   Complete by: As directed    Discharge instructions   Complete by: As directed    Follow with Primary MD Fleet Contras, MD in 7 days, follow-up with your orthopedic surgeon Dr. Lajoyce Corners within a week of discharge  Get CBC, CMP, 2 view Chest X ray -  checked next visit with your primary MD   Activity: As tolerated with Full fall precautions use walker/cane & assistance as needed  Disposition Home    Diet: Heart Healthy   Special Instructions: If you have smoked or chewed Tobacco  in the last 2 yrs  please stop smoking, stop any regular Alcohol  and or any Recreational drug use.  On your next visit with your primary care physician please Get Medicines reviewed and adjusted.  Please request your Prim.MD to go over all Hospital Tests and Procedure/Radiological results at the follow up, please get all Hospital records sent to your Prim MD by signing hospital release before you go home.  If you experience worsening of your admission symptoms, develop shortness of breath, life threatening emergency, suicidal or homicidal thoughts you must seek medical attention immediately by calling 911 or calling your MD immediately  if symptoms less severe.  You Must read complete instructions/literature along with all the possible adverse reactions/side effects for all the Medicines you take and that have been prescribed to you. Take any new Medicines after you have completely understood and accpet all the possible adverse reactions/side effects.   Do not drive when taking Pain medications.  Do not take more than prescribed Pain, Sleep and Anxiety Medications   Discharge wound care:   Complete by: As directed    Every other day dry dressing of the left AKA stump.  Follow-up with Dr. Lajoyce Corners within a week   Increase activity slowly   Complete by: As directed        Discharge Medications   Allergies as of 04/04/2023   No Known Allergies      Medication List     STOP taking these medications    lisinopril 20 MG tablet Commonly known as: ZESTRIL   nicotine 21 mg/24hr patch Commonly known as: NICODERM CQ - dosed in mg/24 hours   zinc sulfate (50mg  elemental zinc) 220 (50 Zn) MG capsule       TAKE these medications    albuterol 108 (90 Base) MCG/ACT inhaler Commonly known as: VENTOLIN HFA Inhale 2 puffs into the lungs every 4 (four) hours as needed for wheezing or shortness of breath.   budesonide 0.5 MG/2ML nebulizer solution Commonly known as: PULMICORT Take 2 mLs (0.5 mg total) by  nebulization 2 (two) times daily.   doxycycline 100 MG capsule Commonly known as: VIBRAMYCIN Take 1 capsule (100 mg total) by mouth 2 (two) times daily.   folic acid 1 MG tablet Commonly known as: FOLVITE Take 1 tablet (1 mg total) by mouth daily.   ipratropium-albuterol 0.5-2.5 (3) MG/3ML Soln Commonly known as: DUONEB Take 3 mLs by  nebulization 4 (four) times daily.   OXYGEN Inhale 1-3 L into the lungs See admin instructions. Use overnight and when resting in bed   thiamine 100 MG tablet Commonly known as: VITAMIN B1 Take 1 tablet (100 mg total) by mouth daily.               Discharge Care Instructions  (From admission, onward)           Start     Ordered   04/04/23 0000  Discharge wound care:       Comments: Every other day dry dressing of the left AKA stump.  Follow-up with Dr. Lajoyce Corners within a week   04/04/23 0454             Follow-up Information     Nadara Mustard, MD Follow up in 1 week(s).   Specialty: Orthopedic Surgery Contact information: [address] [place] [address] [phone]         Fleet Contras, MD. Schedule an appointment as soon as possible for a visit in 1 week(s).   Specialty: Internal Medicine Contact information: [address] [place] [address] [phone]         Nadara Mustard, MD. Schedule an appointment as soon as possible for a visit in 1 week(s).   Specialty: Orthopedic Surgery Contact information: [address] [place] [address] [phone]                 Major procedures and Radiology Reports - PLEASE review detailed and final reports thoroughly  -      DG Chest [facility] 1 View Result Date: 04/04/2023 CLINICAL DATA:  Shortness of breath. EXAM: PORTABLE CHEST 1 VIEW COMPARISON:  04/01/2023 FINDINGS: Focal airspace opacity identified retrocardiac left base, progressive in the interval. Right lung clear. The cardiopericardial silhouette is within normal limits for size. No acute  bony abnormality. Telemetry leads overlie the chest. IMPRESSION: Progressive airspace opacity retrocardiac left base. Imaging features suspicious for pneumonia. Electronically Signed   By: Kennith Center M.D.   On: 04/04/2023 06:46   DG Chest Port 1 View Result Date: 04/01/2023 CLINICAL DATA:  Shortness of breath.  Hypertension. EXAM: PORTABLE CHEST 1 VIEW COMPARISON:  03/30/2023 FINDINGS: Heart size is normal. No pleural fluid, interstitial edema or airspace disease. Chronic bronchial wall thickening identified. IMPRESSION: 1. No acute findings. 2. Chronic bronchial wall thickening. Electronically Signed   By: Signa Kell M.D.   On: 04/01/2023 09:34   MR FEMUR LEFT W WO CONTRAST Result Date: 03/30/2023 CLINICAL DATA:  Chronic left leg pain, infection EXAM: MR OF THE LEFT LOWER EXTREMITY WITHOUT AND WITH CONTRAST TECHNIQUE: Multiplanar, multisequence MR imaging of the left thigh was performed both before and after administration of intravenous contrast. CONTRAST:  8mL GADAVIST GADOBUTROL 1 MMOL/ML IV SOLN COMPARISON:  X-ray 03/29/2023, MRI 07/28/2020 FINDINGS: Bones/Joint/Cartilage Postsurgical changes from above knee amputation of the left lower extremity. Residual hardware within the femoral diaphysis. Redemonstration of increased T2 signal within the anterior aspect of the distal femur (series 7, image 19; series 6, image 50). This is less pronounced when compared with the prior MRI from 2022. Given stability of this finding, this is felt to be unlikely secondary to acute osteomyelitis. No additional sites of bone marrow edema are identified. No fracture or dislocation. No femoral head avascular necrosis. Mild-to-moderate degenerative changes of both hips. Ligaments Intact. Muscles and Tendons Progressed muscle atrophy. Intramuscular edema, more pronounced in the anterior compartment which may be related to denervation or  myositis. No acute tendon injury. Soft tissues Soft tissue thinning over the distal  stump with residual hardware closely approximating the skin surface. No overlying soft tissue edema or fluid collection. 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 IMPRESSION: 1. Postsurgical changes from above knee amputation of the left lower extremity. Redemonstration of increased T2 signal within the anterior aspect of the distal femur. This is less pronounced when compared with the prior MRI from 2022. Given stability of this finding, this is felt to be unlikely secondary to acute osteomyelitis. 2. Soft tissue thinning over the distal stump with residual hardware closely approximating the skin surface. No overlying soft tissue edema or fluid collection. 3. Intramuscular edema, more pronounced in the anterior compartment which may be related to denervation or myositis. 4. 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 Electronically Signed   By: Duanne Guess D.O.   On: 03/30/2023 08:29   DG Chest Portable 1 View Result Date: 03/30/2023 CLINICAL DATA:  Shortness of breath EXAM: PORTABLE CHEST 1 VIEW COMPARISON:  11/20/2022 FINDINGS: Heart and mediastinal contours are within normal limits. No focal opacities or effusions. No acute bony abnormality. IMPRESSION: No active disease. Electronically Signed   By: Charlett Nose M.D.   On: 03/30/2023 01:06   DG FEMUR MIN 2 VIEWS LEFT Result Date: 03/29/2023 CLINICAL DATA:  Chronic left leg pain, prior mid femoral amputation EXAM: LEFT FEMUR 2 VIEWS COMPARISON:  07/26/2022 FINDINGS: Frontal and lateral views of the left femur are obtained. Prior amputation at the level of the mid diaphysis, with postsurgical changes related to prior orthopedic hardware noted. Mild heterotopic ossification at the amputation site. No acute or destructive bony abnormalities. There is thinning of the soft tissues overlying the femoral amputation site, with no definite ulceration or subcutaneous gas noted. No periosteal  reaction to suggest osteomyelitis. Left hip is well aligned. Diffuse atherosclerosis. IMPRESSION: 1. Progressive thinning of the soft tissues overlying the femoral amputation site, with no evidence of soft tissue ulceration or underlying osteomyelitis. Electronically Signed   By: Sharlet Salina M.D.   On: 03/29/2023 18:42    Micro Results    Recent Results (from the past 240 hours)  Culture, blood (Routine X 2) w Reflex to ID Panel     Status: None (Preliminary result)   Collection Time: 03/31/23  8:45 AM   Specimen: BLOOD  Result Value Ref Range Status   Specimen Description BLOOD BLOOD LEFT ARM  Final   Special Requests   Final    BOTTLES DRAWN AEROBIC AND ANAEROBIC Blood Culture results may not be optimal due to an inadequate volume of blood received in culture bottles   Culture   Final    NO GROWTH 3 DAYS Performed at [facility], [address], [place], [place] [address]    Report Status PENDING  Incomplete  Culture, blood (Routine X 2) w Reflex to ID Panel     Status: None (Preliminary result)   Collection Time: 03/31/23  8:50 AM   Specimen: BLOOD  Result Value Ref Range Status   Specimen Description BLOOD BLOOD RIGHT ARM  Final   Special Requests   Final    AEROBIC BOTTLE ONLY Blood Culture results may not be optimal due to an inadequate volume of blood received in culture bottles   Culture   Final    NO GROWTH 3 DAYS Performed at [facility], [address] [place], [place] [address]  Report Status PENDING  Incomplete    Today   Subjective    Trygve Thal today has no headache,no chest abdominal pain,no new weakness tingling or numbness, feels much better wants to go home today.    Objective   Blood pressure 99/71, pulse 89, temperature 97.8 F (36.6 C), temperature source Oral, resp. rate 20, height 5\' 6"  (1.676 m), weight 45.9 kg, SpO2 97%.   Intake/Output Summary (Last 24 hours) at 04/04/2023 0826 Last data filed at 04/04/2023 0540 Gross  per 24 hour  Intake 410 ml  Output 700 ml  Net -290 ml    Exam  Awake Alert, No new F.N deficits,    [place].AT,PERRAL Supple Neck,   Symmetrical Chest wall movement, Good air movement bilaterally, CTAB, no wheezing or crackles RRR,No Gallops,   +ve B.Sounds, Abd Soft, Non tender,  No Cyanosis, eft AKA stump site under bandage   Data Review   Recent Labs  Lab 03/29/23 1745 03/30/23 0812 03/31/23 0643 04/01/23 0729 04/02/23 0537  WBC 11.4*  --  5.6 4.9 5.0  HGB 13.3 13.3 11.9* 12.5* 10.7*  HCT 44.3 39.0 39.2 41.2 33.9*  PLT 212  --  192 222 243  MCV 99.1  --  96.8 95.4 94.2  MCH 29.8  --  29.4 28.9 29.7  MCHC 30.0  --  30.4 30.3 31.6  RDW 12.9  --  13.0 13.0 13.2  LYMPHSABS 0.9  --   --  1.0 0.7  MONOABS 0.8  --   --  0.5 0.5  EOSABS 0.0  --   --  0.0 0.1  BASOSABS 0.0  --   --  0.0 0.0    Recent Labs  Lab 03/29/23 1637 03/29/23 1745 03/30/23 0150 03/30/23 0812 03/31/23 0643 04/01/23 0729 04/01/23 1411 04/02/23 0537 04/03/23 0502  NA 139  --   --  135 135 135  --  136 135  K 5.4*  --   --  4.5 4.4 4.4  --  4.4 4.4  CL 90*  --   --   --  87* 89*  --  93* 96*  CO2 30  --   --   --  35* 35*  --  34* 31  ANIONGAP 19*  --   --   --  13 11  --  9 8  GLUCOSE 96  --   --   --  99 125*  --  131* 159*  BUN 23  --   --   --  10 5*  --  5* 8  CREATININE 0.53*  --   --   --  0.44* 0.47*  --  0.45* 0.57*  CRP  --  13.3*  --   --   --   --  1.3* 0.9  --   PROCALCITON  --   --   --   --   --  <0.10  --  <0.10  --   BNP  --   --  66.6  --   --   --   --  25.6  --   MG  --   --   --   --   --  2.3  --  2.2  --   PHOS  --   --   --   --   --   --   --  2.3* 3.4  CALCIUM 9.0  --   --   --  8.8* 9.3  --  9.1 8.8*  Total Time in preparing paper work, data evaluation and todays exam - 35 minutes  Signature  -    Susa Raring M.D on 04/04/2023 at 8:26 AM   -  To page go to [email or web address]
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
## 2023-04-05 LAB — CULTURE, BLOOD (ROUTINE X 2)
Culture: NO GROWTH
Culture: NO GROWTH

## 2023-05-16 ENCOUNTER — Encounter (HOSPITAL_COMMUNITY): Payer: Self-pay

## 2023-05-16 ENCOUNTER — Emergency Department (HOSPITAL_COMMUNITY)

## 2023-05-16 ENCOUNTER — Other Ambulatory Visit: Payer: Self-pay

## 2023-05-16 ENCOUNTER — Emergency Department (HOSPITAL_COMMUNITY)
Admission: EM | Admit: 2023-05-16 | Discharge: 2023-05-16 | Disposition: A | Discharge status: Home / Self Care | Attending: Emergency Medicine | Admitting: Emergency Medicine

## 2023-05-16 DIAGNOSIS — I10 Essential (primary) hypertension: Secondary | ICD-10-CM | POA: Diagnosis not present

## 2023-05-16 DIAGNOSIS — Z7951 Long term (current) use of inhaled steroids: Secondary | ICD-10-CM | POA: Insufficient documentation

## 2023-05-16 DIAGNOSIS — T8744 Infection of amputation stump, left lower extremity: Secondary | ICD-10-CM | POA: Diagnosis present

## 2023-05-16 DIAGNOSIS — Y649 Contaminated medical or biological substance administered by unspecified means: Secondary | ICD-10-CM | POA: Insufficient documentation

## 2023-05-16 DIAGNOSIS — T148XXA Other injury of unspecified body region, initial encounter: Secondary | ICD-10-CM

## 2023-05-16 DIAGNOSIS — J4489 Other specified chronic obstructive pulmonary disease: Secondary | ICD-10-CM | POA: Diagnosis not present

## 2023-05-16 LAB — CBC WITH DIFFERENTIAL/PLATELET
Abs Immature Granulocytes: 0.02 10*3/uL (ref 0.00–0.07)
Basophils Absolute: 0.1 10*3/uL (ref 0.0–0.1)
Basophils Relative: 1 %
Eosinophils Absolute: 0.2 10*3/uL (ref 0.0–0.5)
Eosinophils Relative: 2 %
HCT: 39.1 % (ref 39.0–52.0)
Hemoglobin: 11.9 g/dL — ABNORMAL LOW (ref 13.0–17.0)
Immature Granulocytes: 0 %
Lymphocytes Relative: 13 %
Lymphs Abs: 1.1 10*3/uL (ref 0.7–4.0)
MCH: 28.5 pg (ref 26.0–34.0)
MCHC: 30.4 g/dL (ref 30.0–36.0)
MCV: 93.8 fL (ref 80.0–100.0)
Monocytes Absolute: 0.8 10*3/uL (ref 0.1–1.0)
Monocytes Relative: 10 %
Neutro Abs: 6.1 10*3/uL (ref 1.7–7.7)
Neutrophils Relative %: 74 %
Platelets: 195 10*3/uL (ref 150–400)
RBC: 4.17 MIL/uL — ABNORMAL LOW (ref 4.22–5.81)
RDW: 14.3 % (ref 11.5–15.5)
WBC: 8.2 10*3/uL (ref 4.0–10.5)
nRBC: 0 % (ref 0.0–0.2)

## 2023-05-16 LAB — URINALYSIS, W/ REFLEX TO CULTURE (INFECTION SUSPECTED)
Bacteria, UA: NONE SEEN
Bilirubin Urine: NEGATIVE
Glucose, UA: NEGATIVE mg/dL
Hgb urine dipstick: NEGATIVE
Ketones, ur: NEGATIVE mg/dL
Leukocytes,Ua: NEGATIVE
Nitrite: NEGATIVE
Protein, ur: NEGATIVE mg/dL
Specific Gravity, Urine: 1.004 — ABNORMAL LOW (ref 1.005–1.030)
pH: 6 (ref 5.0–8.0)

## 2023-05-16 LAB — I-STAT CG4 LACTIC ACID, ED
Lactic Acid, Venous: 2.4 mmol/L (ref 0.5–1.9)
Lactic Acid, Venous: 1.4 mmol/L (ref 0.5–1.9)

## 2023-05-16 LAB — COMPREHENSIVE METABOLIC PANEL WITH GFR
ALT: 10 U/L (ref 0–44)
AST: 14 U/L — ABNORMAL LOW (ref 15–41)
Albumin: 3.5 g/dL (ref 3.5–5.0)
Alkaline Phosphatase: 66 U/L (ref 38–126)
Anion gap: 10 (ref 5–15)
BUN: 7 mg/dL — ABNORMAL LOW (ref 8–23)
CO2: 30 mmol/L (ref 22–32)
Calcium: 9.4 mg/dL (ref 8.9–10.3)
Chloride: 95 mmol/L — ABNORMAL LOW (ref 98–111)
Creatinine, Ser: 0.34 mg/dL — ABNORMAL LOW (ref 0.61–1.24)
GFR, Estimated: >60 mL/min (ref >60)
Glucose, Bld: 74 mg/dL (ref 70–99)
Potassium: 3.9 mmol/L (ref 3.5–5.1)
Sodium: 135 mmol/L (ref 135–145)
Total Bilirubin: 0.3 mg/dL (ref 0.0–1.2)
Total Protein: 6.9 g/dL (ref 6.5–8.1)

## 2023-05-16 NOTE — Discharge Instructions (Addendum)
 You were evaluated in the emergency room for your chronic wound.  Does not appear to be actively infected. You are provided contact information for wound care as well as orthopedics with Dr. Lajoyce Corners.  Please call make an appointment within the coming weeks.  If you experience any new or worsening symptoms including worsening pain, fevers or chills please return to the emergency room.
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
## 2023-05-16 NOTE — ED Notes (Signed)
[facility]tar called pt to [facility]
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
## 2023-06-13 ENCOUNTER — Emergency Department (HOSPITAL_COMMUNITY)
Admission: EM | Admit: 2023-06-13 | Discharge: 2023-06-14 | Disposition: A | Discharge status: Home / Self Care | Attending: Emergency Medicine | Admitting: Emergency Medicine

## 2023-06-13 ENCOUNTER — Emergency Department (HOSPITAL_COMMUNITY)

## 2023-06-13 ENCOUNTER — Other Ambulatory Visit: Payer: Self-pay

## 2023-06-13 ENCOUNTER — Encounter (HOSPITAL_COMMUNITY): Payer: Self-pay

## 2023-06-13 DIAGNOSIS — K5900 Constipation, unspecified: Secondary | ICD-10-CM | POA: Insufficient documentation

## 2023-06-13 DIAGNOSIS — R0602 Shortness of breath: Secondary | ICD-10-CM | POA: Diagnosis present

## 2023-06-13 DIAGNOSIS — J441 Chronic obstructive pulmonary disease with (acute) exacerbation: Secondary | ICD-10-CM | POA: Diagnosis not present

## 2023-06-13 DIAGNOSIS — Z7951 Long term (current) use of inhaled steroids: Secondary | ICD-10-CM | POA: Insufficient documentation

## 2023-06-13 LAB — CBC WITH DIFFERENTIAL/PLATELET
Abs Immature Granulocytes: 0.02 10*3/uL (ref 0.00–0.07)
Basophils Absolute: 0 10*3/uL (ref 0.0–0.1)
Basophils Relative: 1 %
Eosinophils Absolute: 0.1 10*3/uL (ref 0.0–0.5)
Eosinophils Relative: 2 %
HCT: 40 % (ref 39.0–52.0)
Hemoglobin: 12.3 g/dL — ABNORMAL LOW (ref 13.0–17.0)
Immature Granulocytes: 0 %
Lymphocytes Relative: 20 %
Lymphs Abs: 1.1 10*3/uL (ref 0.7–4.0)
MCH: 29.4 pg (ref 26.0–34.0)
MCHC: 30.8 g/dL (ref 30.0–36.0)
MCV: 95.7 fL (ref 80.0–100.0)
Monocytes Absolute: 0.4 10*3/uL (ref 0.1–1.0)
Monocytes Relative: 7 %
Neutro Abs: 3.6 10*3/uL (ref 1.7–7.7)
Neutrophils Relative %: 70 %
Platelets: 153 10*3/uL (ref 150–400)
RBC: 4.18 MIL/uL — ABNORMAL LOW (ref 4.22–5.81)
RDW: 15.2 % (ref 11.5–15.5)
WBC: 5.2 10*3/uL (ref 4.0–10.5)
nRBC: 0 % (ref 0.0–0.2)

## 2023-06-13 LAB — I-STAT VENOUS BLOOD GAS, ED
Acid-Base Excess: 4 mmol/L — ABNORMAL HIGH (ref 0.0–2.0)
Bicarbonate: 32.4 mmol/L — ABNORMAL HIGH (ref 20.0–28.0)
Calcium, Ion: 1.21 mmol/L (ref 1.15–1.40)
HCT: 39 % (ref 39.0–52.0)
Hemoglobin: 13.3 g/dL (ref 13.0–17.0)
O2 Saturation: 53 %
Potassium: 3.3 mmol/L — ABNORMAL LOW (ref 3.5–5.1)
Sodium: 137 mmol/L (ref 135–145)
TCO2: 34 mmol/L — ABNORMAL HIGH (ref 22–32)
pCO2, Ven: 67.9 mmHg — ABNORMAL HIGH (ref 44–60)
pH, Ven: 7.286 (ref 7.25–7.43)
pO2, Ven: 33 mmHg (ref 32–45)

## 2023-06-13 LAB — COMPREHENSIVE METABOLIC PANEL WITH GFR
ALT: 11 U/L (ref 0–44)
AST: 17 U/L (ref 15–41)
Albumin: 3.6 g/dL (ref 3.5–5.0)
Alkaline Phosphatase: 63 U/L (ref 38–126)
Anion gap: 10 (ref 5–15)
BUN: 8 mg/dL (ref 8–23)
CO2: 31 mmol/L (ref 22–32)
Calcium: 9.5 mg/dL (ref 8.9–10.3)
Chloride: 95 mmol/L — ABNORMAL LOW (ref 98–111)
Creatinine, Ser: 0.58 mg/dL — ABNORMAL LOW (ref 0.61–1.24)
GFR, Estimated: >60 mL/min (ref >60)
Glucose, Bld: 139 mg/dL — ABNORMAL HIGH (ref 70–99)
Potassium: 3.4 mmol/L — ABNORMAL LOW (ref 3.5–5.1)
Sodium: 136 mmol/L (ref 135–145)
Total Bilirubin: 0.4 mg/dL (ref 0.0–1.2)
Total Protein: 6.7 g/dL (ref 6.5–8.1)

## 2023-06-13 MED ORDER — IOHEXOL 350 MG/ML SOLN
75.0000 mL | Freq: Once | INTRAVENOUS | Status: AC | PRN
Start: 2023-06-13 — End: 2023-06-13
  Administered 2023-06-13: 75 mL via INTRAVENOUS

## 2023-06-13 NOTE — ED Provider Notes (Signed)
  Provider Note MRN:  [id number]  Arrival date & time: 06/14/23    ED Course and Medical Decision Making  Assumed care of patient at sign-out or upon transfer.  Initial presentation for COPD exacerbation that has improved with standard treatment, now on his baseline oxygen .  Secondary complaint of abdominal pain and constipation with large stool ball on CT.  Prior provider attempted disimpaction and now patient is receiving soapsuds enema.  Would consider admission if unable to have stool.  1 AM update: Successful bowel movement after enema, patient sitting comfortably tolerating p.o., appropriate for discharge.  Procedures  Final Clinical Impressions(s) / ED Diagnoses     ICD-10-CM   1. COPD exacerbation (HCC)  J44.1     2. Constipation, unspecified constipation type  K59.00       ED Discharge Orders          Ordered    polyethylene glycol (MIRALAX ) 17 g packet  Daily        06/14/23 0102    predniSONE  (DELTASONE ) 20 MG tablet  Daily        06/14/23 0102              Discharge Instructions      You were evaluated in the Emergency Department and after careful evaluation, we did not find any emergent condition requiring admission or further testing in the hospital.  Your exam/testing today is overall reassuring.  Symptoms likely due to a flare of your COPD as well as constipation.  Use the prednisone  medication daily to help with your lungs.  Take the MiraLAX  as needed so that you are having soft stools at least daily.  Please return to the Emergency Department if you experience any worsening of your condition.   Thank you for allowing us  to be a part of your care.      Merrick Abe. Harless Lien, MD [facility] Emergency Medicine [facility] [email or web address]    Edson Graces, MD 06/14/23 [phone]

## 2023-06-13 NOTE — ED Provider Notes (Signed)
 [place] EMERGENCY DEPARTMENT AT [facility] Provider Note   CSN: [id number] Arrival date & time: 06/13/23  1929     History  Chief Complaint  Patient presents with   Respiratory Distress    [name] is a 68 y.o. male.  History of COPD, status post left AKA who presents to the ED for shortness of breath.  Patient has experienced ongoing shortness of breath over the last 24 hours and EMS was called for this reason.  EMS gave DuoNeb treatment after noting the patient to be hypoxic 87% not on oxygen .  They also gave 200 mL normal saline and 125 mg IV Solu-Medrol .  Patient wears 1 to 2 L at baseline at rest at home.  He has secondary complaint of recurrent cramping abdominal pain.  Has prior history of chronic abscess of lower leg with chronic wound dehiscence.  No recent fevers chills or overt change in appearance of his left lower extremity status post left AKA.  Denies chest pain nausea vomiting fevers and chills  HPI     Home Medications Prior to Admission medications   Medication Sig Start Date End Date Taking? Authorizing Provider  albuterol  (VENTOLIN  HFA) 108 (90 Base) MCG/ACT inhaler Inhale 2 puffs into the lungs every 4 (four) hours as needed for wheezing or shortness of breath. 11/20/22   Kingsley, Victoria K, DO  budesonide  (PULMICORT ) 0.5 MG/2ML nebulizer solution Take 2 mLs (0.5 mg total) by nebulization 2 (two) times daily. 08/02/22   Lorita Rosa, MD  doxycycline  (VIBRA -TABS) 100 MG tablet Take 1 tablet (100 mg total) by mouth 2 (two) times daily. 04/04/23   Singh, Prashant K, MD  folic acid  (FOLVITE ) 1 MG tablet Take 1 tablet (1 mg total) by mouth daily. 04/04/23   Singh, Prashant K, MD  ipratropium-albuterol  (DUONEB) 0.5-2.5 (3) MG/3ML SOLN Take 3 mLs by nebulization 4 (four) times daily. 08/02/22   Lorita Rosa, MD  OXYGEN  Inhale 1-3 L into the lungs See admin instructions. Use overnight and when resting in bed    [provider]  thiamine   (VITAMIN B1) 100 MG tablet Take 1 tablet (100 mg total) by mouth daily. 04/04/23   Singh, Prashant K, MD      Allergies    Patient has no known allergies.    Review of Systems   Review of Systems  Physical Exam Updated Vital Signs BP 115/69   Pulse 80   Resp (!) 48   SpO2 100%  Physical Exam Vitals and nursing note reviewed.  HENT:     Head: Normocephalic and atraumatic.  Eyes:     Pupils: Pupils are equal, round, and reactive to light.  Cardiovascular:     Rate and Rhythm: Normal rate and regular rhythm.  Pulmonary:     Effort: Pulmonary effort is normal.     Breath sounds: Normal breath sounds. No wheezing.  Abdominal:     Palpations: Abdomen is soft.     Tenderness: There is no abdominal tenderness.  Skin:    General: Skin is warm and dry.  Neurological:     Mental Status: He is alert.  Psychiatric:        Mood and Affect: Mood normal.     ED Results / Procedures / Treatments   Labs (all labs ordered are listed, but only abnormal results are displayed) Labs Reviewed  COMPREHENSIVE METABOLIC PANEL WITH GFR - Abnormal; Notable for the following components:      Result Value   Potassium 3.4 (*)  Chloride 95 (*)    Glucose, Bld 139 (*)    Creatinine, Ser 0.58 (*)    All other components within normal limits  CBC WITH DIFFERENTIAL/PLATELET - Abnormal; Notable for the following components:   RBC 4.18 (*)    Hemoglobin 12.3 (*)    All other components within normal limits  I-STAT VENOUS BLOOD GAS, ED - Abnormal; Notable for the following components:   pCO2, Ven 67.9 (*)    Bicarbonate 32.4 (*)    TCO2 34 (*)    Acid-Base Excess 4.0 (*)    Potassium 3.3 (*)    All other components within normal limits  RESP PANEL BY RT-PCR (RSV, FLU A&B, COVID)  [id number]    EKG None  Radiology CT ABDOMEN PELVIS W CONTRAST Result Date: 06/13/2023 CLINICAL DATA:  cramping abd pain, constipation, ?obstruction EXAM: CT ABDOMEN AND PELVIS WITH CONTRAST TECHNIQUE: Multidetector  CT imaging of the abdomen and pelvis was performed using the standard protocol following bolus administration of intravenous contrast. RADIATION DOSE REDUCTION: This exam was performed according to the departmental dose-optimization program which includes automated exposure control, adjustment of the mA and/or kV according to patient size and/or use of iterative reconstruction technique. CONTRAST:  75mL OMNIPAQUE  IOHEXOL  350 MG/ML SOLN COMPARISON:  None Available. FINDINGS: Lower chest: No acute abnormality. Hepatobiliary: Subcentimeter fluid densely lesion of the left hepatic lobe (7:86) likely simple hepatic cyst. Calcified gallstone noted within the gallbladder lumen. No gallbladder wall thickening or pericholecystic fluid. No biliary dilatation. Pancreas: No focal lesion. Normal pancreatic contour. No surrounding inflammatory changes. No main pancreatic ductal dilatation. Spleen: Normal in size without focal abnormality. Adrenals/Urinary Tract: No adrenal nodule bilaterally. Bilateral kidneys enhance symmetrically. Subcentimeter hypodensities are too small to characterize-no further follow-up indicated. No hydronephrosis. No hydroureter. No nephroureterolithiasis bilaterally. The urinary bladder is unremarkable. On delayed imaging, there is no urothelial wall thickening and there are no filling defects in the opacified portions of the bilateral collecting systems or ureters. Stomach/Bowel: Stomach is within normal limits. No evidence of bowel wall thickening or dilatation. Stool throughout the colon. 8 mm rectal stool ball. Colonic diverticulosis. Appendix appears normal. Vascular/Lymphatic: No abdominal aorta or iliac aneurysm. Severe atherosclerotic plaque of the aorta and its branches. No abdominal, pelvic, or inguinal lymphadenopathy. Reproductive: Prostate is unremarkable. Other: No intraperitoneal free fluid. No intraperitoneal free gas. No organized fluid collection. Musculoskeletal: No abdominal wall  hernia or abnormality. No suspicious lytic or blastic osseous lesions. No acute displaced fracture. Partially visualized right femoral shaft surgical hardware. IMPRESSION: 1. Stool throughout the colon-correlate for constipation. An 8 cm rectal stool ball. No findings of stercoral colitis. 2. Colonic diverticulosis with no acute diverticulitis. 3. Cholelithiasis with no CT evidence acute cholecystitis. Electronically Signed   By: Morgane  Naveau M.D.   On: 06/13/2023 22:28   DG Femur Portable Min 2 Views Left Result Date: 06/13/2023 CLINICAL DATA:  Left lower extremity amputation, concern for infection EXAM: LEFT FEMUR PORTABLE 2 VIEWS COMPARISON:  05/16/2023 FINDINGS: Frontal and lateral views of the left femur are obtained. Prior mid femoral amputation. There are no acute or destructive bony abnormalities. Stable left hip osteoarthritis. Extensive atherosclerosis. Soft tissue swelling along the lateral margin of the amputation site. No subcutaneous gas or radiopaque foreign body. IMPRESSION: 1. Soft tissue swelling at the amputation site. No evidence of fracture, radiopaque foreign body, or osteomyelitis. Electronically Signed   By: Bobbye Burrow M.D.   On: 06/13/2023 20:52   DG Hand 2 View Left Result Date: 06/13/2023 CLINICAL  DATA:  Contraction of the fourth and fifth fingers. Unable to straighten. Query dislocated. EXAM: LEFT HAND - 2 VIEW COMPARISON:  None Available. FINDINGS: Flexion deformities of the fourth and fifth fingers. No discrete fracture or dislocation is identified. Degenerative changes in the interphalangeal joints, first carpometacarpal joint, STT, and radiocarpal joints. 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 Small osteophyte arising from the distal fifth metacarpal bone, likely exostosis. IMPRESSION: Contraction deformities of the fourth and fifth fingers. No acute bony abnormalities are suggested. Degenerative changes in the  hand and wrist. Electronically Signed   By: Boyce Byes M.D.   On: 06/13/2023 20:27   DG Chest Portable 1 View Result Date: 06/13/2023 EXAM: 1 VIEW XRAY OF THE CHEST 06/13/2023 07:58:00 PM COMPARISON: 04/04/2023 CLINICAL HISTORY: COPD. Respiratory Distress. FINDINGS: LUNGS AND PLEURA: No consolidation. No pulmonary edema. No pleural effusion. No pneumothorax. HEART AND MEDIASTINUM: No acute abnormality of the cardiac and mediastinal silhouettes. BONES AND SOFT TISSUES: Degenerative changes of the right shoulder. No acute osseous abnormality. IMPRESSION: 1. No acute findings. Electronically signed by: Zadie Herter MD 06/13/2023 08:01 PM EDT RP Workstation: [id number]    Procedures Procedures    Medications Ordered in ED Medications  iohexol  (OMNIPAQUE ) 350 MG/ML injection 75 mL (75 mLs Intravenous Contrast Given 06/13/23 2205)    ED Course/ Medical Decision Making/ A&P Clinical Course as of 06/13/23 2332  Tue Jun 13, 2023  2328 Laboratory workup notable for slight hypercarbia on venous blood gas.  Patient on his home O2 currently after MS medications prior to arrival.  No recurrent hypoxia or wheezing.  No leukocytosis.  Chest x-ray unremarkable.  Left hand x-ray shows chronic contracture without osseous abnormality No acute infection of left stump and history of chronic wound dehiscence  CT abdomen pelvis concerning for significant constipation with large stool ball in the rectal vault.  Attempted manual disimpaction as well as disimpaction with passing Foley catheter.  We were able to remove a small amount of stool and hopefully loosen the large stool ball.  Will trial soapsuds enema to see if we can achieve a satisfactory bowel movement here in the ED and relieve the patient's discomfort  I, Rafael Bun DO, am transitioning care of this patient to the oncoming provider pending reevaluation after enema and disposition [MP]    Clinical Course User Index [MP] Sallyanne Creamer, DO                                  Medical Decision Making 68 year old male with history as above presenting for shortness of breath.  Secondary complaint of abdominal pain.  Chronic wound dehiscence of left AKA.  Feels much better after EMS interventions DuoNeb Solu-Medrol .  No abdominal tenderness on my exam but given recurrent cramping abdominal pain will obtain CT abdomen pelvis.  Will also obtain left lower extremity femur x-ray but low suspicion for acute infectious process given history of chronic wound dehiscence and lack of systemic infectious symptoms.  Patient notes increased tenderness over left pinky and his he is not sure if he hurt it.  Will obtain x-ray of this as well  Amount and/or Complexity of Data Reviewed Labs: ordered. Radiology: ordered.  Risk Prescription drug management.           Final Clinical Impression(s) / ED Diagnoses Final diagnoses:  COPD exacerbation (HCC)  Constipation, unspecified constipation type    Rx / DC  Orders ED Discharge Orders     None         Sallyanne Creamer, DO 06/13/23 2332

## 2023-06-13 NOTE — ED Triage Notes (Signed)
 Pt bibgcems from home pt called for resp. Distress. 87% upon ems arrival with no oxygen  on  Ems gave duoneb with audible wheezing. 2 duonebs in total , 125mg  solumedrol, ns  with ems. Pt wear oxygen  at baseline unknown amount. Bp initial  98/68 last 106/72  Hr 80 100% with oxygen 

## 2023-06-14 MED ORDER — PREDNISONE 20 MG PO TABS: 40.0000 mg | ORAL_TABLET | Freq: Every day | ORAL | 0 refills | Status: AC

## 2023-06-14 MED ORDER — POLYETHYLENE GLYCOL 3350 17 G PO PACK: 17.0000 g | PACK | Freq: Every day | ORAL | 1 refills | Status: DC

## 2023-06-14 NOTE — ED Notes (Signed)
 Pt had two large BM after soap suds enema

## 2023-06-14 NOTE — Discharge Instructions (Addendum)
 You were evaluated in the Emergency Department and after careful evaluation, we did not find any emergent condition requiring admission or further testing in the hospital.  Your exam/testing today is overall reassuring.  Symptoms likely due to a flare of your COPD as well as constipation.  Use the prednisone  medication daily to help with your lungs.  Take the MiraLAX  as needed so that you are having soft stools at least daily.  Please return to the Emergency Department if you experience any worsening of your condition.   Thank you for allowing us  to be a part of your care.

## 2023-06-15 ENCOUNTER — Emergency Department (HOSPITAL_COMMUNITY)
Admission: EM | Admit: 2023-06-15 | Discharge: 2023-06-16 | Disposition: A | Discharge status: Home / Self Care | Attending: Emergency Medicine | Admitting: Emergency Medicine

## 2023-06-15 ENCOUNTER — Other Ambulatory Visit: Payer: Self-pay

## 2023-06-15 ENCOUNTER — Encounter (HOSPITAL_COMMUNITY): Payer: Self-pay

## 2023-06-15 DIAGNOSIS — J449 Chronic obstructive pulmonary disease, unspecified: Secondary | ICD-10-CM | POA: Insufficient documentation

## 2023-06-15 DIAGNOSIS — Z7951 Long term (current) use of inhaled steroids: Secondary | ICD-10-CM | POA: Insufficient documentation

## 2023-06-15 DIAGNOSIS — R12 Heartburn: Secondary | ICD-10-CM | POA: Insufficient documentation

## 2023-06-15 DIAGNOSIS — F17200 Nicotine dependence, unspecified, uncomplicated: Secondary | ICD-10-CM | POA: Insufficient documentation

## 2023-06-15 DIAGNOSIS — Z9981 Dependence on supplemental oxygen: Secondary | ICD-10-CM | POA: Diagnosis not present

## 2023-06-15 DIAGNOSIS — R0602 Shortness of breath: Secondary | ICD-10-CM | POA: Diagnosis present

## 2023-06-15 MED ORDER — ALUM & MAG HYDROXIDE-SIMETH 200-200-20 MG/5ML PO SUSP
30.0000 mL | Freq: Once | ORAL | Status: AC
  Administered 2023-06-15: 30 mL via ORAL
  Filled 2023-06-15: qty 30

## 2023-06-15 MED ORDER — FAMOTIDINE 20 MG PO TABS
20.0000 mg | ORAL_TABLET | Freq: Every day | ORAL | Status: DC
  Administered 2023-06-15 – 2023-06-16 (×2): 20 mg via ORAL
  Filled 2023-06-15 (×2): qty 1

## 2023-06-15 MED ORDER — CALCIUM CARBONATE ANTACID 500 MG PO CHEW
1.0000 | CHEWABLE_TABLET | Freq: Once | ORAL | Status: AC
  Administered 2023-06-15: 200 mg via ORAL
  Filled 2023-06-15: qty 1

## 2023-06-15 MED ORDER — ALUM & MAG HYDROXIDE-SIMETH 200-200-20 MG/5ML PO SUSP
15.0000 mL | Freq: Once | ORAL | Status: AC
  Administered 2023-06-15: 15 mL via ORAL
  Filled 2023-06-15: qty 30
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
## 2023-06-15 NOTE — ED Notes (Signed)
 Called case mgr and left VM regarding TOC consult

## 2023-06-15 NOTE — ED Notes (Signed)
 Patient resting in bed with eyes closed and visible chest rise noted, in NAD, on 2 LPM via [place].

## 2023-06-15 NOTE — Discharge Planning (Signed)
 RNCM met with pt at bedside regarding medication assistance and needing home health services.  Pt states that he does not have problems securing medications.  His pharmacy delivers to him when he can not pick them up.    RNCM has recommended that this patient have Home Health Services but pt declines at this time. I have discussed the benefits of home health services as wll as the risks of not having home health services with pt. RNCM informed patient that if he changed his mind, his primary care physician can order home health from office.The patient verbalizes understanding. No further RNCM needs identified at this time.   Lucciano Vitali J. Rachel Budds, RN, BSN, NCM  Transitions of Care  Nurse Case Manager  [facility] Emergency Departments  Operative Services  [phone]

## 2023-06-16 DIAGNOSIS — R12 Heartburn: Secondary | ICD-10-CM | POA: Diagnosis not present

## 2023-06-16 NOTE — ED Notes (Signed)
 Transport arrived at this time to transfer patient.

## 2023-06-16 NOTE — ED Provider Notes (Addendum)
 Emergency Medicine Observation Re-evaluation Note  [name] is a 68 y.o. male, seen on rounds today.  Pt initially presented to the ED for complaints of Shortness of Breath Currently, the patient is ok, no issues.  Physical Exam  BP 114/67   Pulse 67   Temp 98.9 F (37.2 C) (Axillary)   Resp 18   SpO2 100%  Physical Exam [place]: awake  ED Course / MDM  EKG:   I have reviewed the labs performed to date as well as medications administered while in observation.  Recent changes in the last 24 hours include nothing.  Plan  Current plan is for TOC.  Supplemental oxygen  has been arranged at home.   Lowery Rue, DO 06/16/23 0813    Lowery Rue, DO 06/16/23 1415
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
## 2023-06-16 NOTE — Discharge Planning (Signed)
 RNCM confirmed with DME company that pt concentrator has been replaced.  No additional RNCM needs identified.  Mayli Covington J. Rachel Budds, RN, BSN, NCM  Transitions of Care  Nurse Case Manager  [facility] Emergency Departments  Operative Services  [phone]

## 2023-06-16 NOTE — Discharge Planning (Signed)
 RNCM reached out to DME company regarding DME replacement.  RNCM left message for a return call on the progress and they are st lunch.  Michelena Culmer J. Rachel Budds, RN, BSN, NCM  Transitions of Care  Nurse Case Manager  [facility] Emergency Departments  Operative Services  [phone]

## 2023-06-24 ENCOUNTER — Encounter (HOSPITAL_COMMUNITY): Payer: Self-pay

## 2023-06-24 ENCOUNTER — Emergency Department (HOSPITAL_COMMUNITY)
Admission: EM | Admit: 2023-06-24 | Discharge: 2023-06-25 | Disposition: A | Discharge status: Home / Self Care | Attending: Emergency Medicine | Admitting: Emergency Medicine

## 2023-06-24 ENCOUNTER — Other Ambulatory Visit: Payer: Self-pay

## 2023-06-24 ENCOUNTER — Emergency Department (HOSPITAL_COMMUNITY)

## 2023-06-24 DIAGNOSIS — J449 Chronic obstructive pulmonary disease, unspecified: Secondary | ICD-10-CM | POA: Insufficient documentation

## 2023-06-24 DIAGNOSIS — R0602 Shortness of breath: Secondary | ICD-10-CM | POA: Insufficient documentation

## 2023-06-24 DIAGNOSIS — K5900 Constipation, unspecified: Secondary | ICD-10-CM | POA: Diagnosis not present

## 2023-06-24 DIAGNOSIS — R109 Unspecified abdominal pain: Secondary | ICD-10-CM | POA: Diagnosis present

## 2023-06-24 MED ORDER — POLYETHYLENE GLYCOL 3350 17 G PO PACK
17.0000 g | PACK | Freq: Every day | ORAL | Status: DC
  Administered 2023-06-24: 17 g via ORAL
  Filled 2023-06-24: qty 1

## 2023-06-24 MED ORDER — FLEET ENEMA RE ENEM
1.0000 | ENEMA | Freq: Once | RECTAL | Status: AC
  Administered 2023-06-24: 1 via RECTAL
  Filled 2023-06-24: qty 1

## 2023-06-24 MED ORDER — POLYETHYLENE GLYCOL 3350 17 G PO PACK: 17.0000 g | PACK | Freq: Every day | ORAL | 0 refills | Status: DC

## 2023-06-24 NOTE — ED Provider Notes (Incomplete)
  [place] EMERGENCY DEPARTMENT AT [facility] Provider Note   CSN: [id number] Arrival date & time: 06/24/23  1720     History {Add pertinent medical, surgical, social history, OB history to HPI:1} Chief Complaint  Patient presents with   Shortness of Breath    Constipation, abdominal pain    [name] is a 68 y.o. male.  HPI     Home Medications Prior to Admission medications   Medication Sig Start Date End Date Taking? Authorizing Provider  albuterol  (VENTOLIN  HFA) 108 (90 Base) MCG/ACT inhaler Inhale 2 puffs into the lungs every 4 (four) hours as needed for wheezing or shortness of breath. Patient taking differently: Inhale 3-4 puffs into the lungs every 4 (four) hours as needed for wheezing or shortness of breath. 11/20/22   Kingsley, Victoria K, DO  ipratropium-albuterol  (DUONEB) 0.5-2.5 (3) MG/3ML SOLN Take 3 mLs by nebulization 4 (four) times daily. 08/02/22   Lorita Rosa, MD  OXYGEN  Inhale 1-3 L into the lungs continuous.    [provider]      Allergies    Patient has no known allergies.    Review of Systems   Review of Systems  Physical Exam Updated Vital Signs BP (!) 139/93 (BP Location: Right Arm)   Pulse 91   Temp 98.3 F (36.8 C) (Oral)   Resp (!) 21   SpO2 99%  Physical Exam  ED Results / Procedures / Treatments   Labs (all labs ordered are listed, but only abnormal results are displayed) Labs Reviewed - No data to display  EKG None  Radiology No results found.  Procedures Procedures  {Document cardiac monitor, telemetry assessment procedure when appropriate:1}  Medications Ordered in ED Medications - No data to display  ED Course/ Medical Decision Making/ A&P   {   Click here for ABCD2, HEART and other calculatorsREFRESH Note before signing :1}                              Medical Decision Making  ***  {Document critical care time when appropriate:1} {Document review of labs and clinical decision  tools ie heart score, Chads2Vasc2 etc:1}  {Document your independent review of radiology images, and any outside records:1} {Document your discussion with family members, caretakers, and with consultants:1} {Document social determinants of health affecting pt's care:1} {Document your decision making why or why not admission, treatments were needed:1} Final Clinical Impression(s) / ED Diagnoses Final diagnoses:  None    Rx / DC Orders ED Discharge Orders     None

## 2023-06-24 NOTE — ED Notes (Signed)
 Patient with positive result noted after enema.   Large BM noted.

## 2023-06-24 NOTE — ED Triage Notes (Signed)
 PT BIB EMS hasn't had a bowel movement in 2 days, has hx COPD has been using inhalers without improvement, was sitting outside without oxygen  waiting on EMS, 2L [place] at baseline  93 RA 98% 2L [place] 110/76 90 HR  Resp 36 ( shallow and decreased )  CBG 163

## 2023-06-24 NOTE — Discharge Instructions (Addendum)
 You were seen for your constipation in the emergency department.   At home, please take MiraLAX  daily.    Check your MyChart online for the results of any tests that had not resulted by the time you left the emergency department.   Follow-up with your primary doctor in 2-3 days regarding your visit.    Return immediately to the emergency department if you experience any of the following: Severe abdominal pain, vomiting, or any other concerning symptoms.    Thank you for visiting our Emergency Department. It was a pleasure taking care of you today.

## 2023-06-24 NOTE — ED Notes (Signed)
 Ptar called

## 2023-06-24 NOTE — ED Notes (Signed)
 Patient given enema.  RN x 2 at bedside to assist.  Pt instructed to hold fluid for as long as possible.

## 2023-06-24 NOTE — ED Provider Notes (Signed)
 [place] EMERGENCY DEPARTMENT AT [facility] Provider Note   CSN: [id number] Arrival date & time: 06/24/23  1720     History {Add pertinent medical, surgical, social history, OB history to HPI:1} Chief Complaint  Patient presents with   Shortness of Breath    Constipation, abdominal pain    [name] is a 68 y.o. male.  68 year old male with a history of polysubstance abuse and COPD on 3 L home oxygen  with an abdominal surgery who presents emergency department constipation.  Patient reports that he has not had a bowel movement in 2 days.  Denies any nausea or vomiting.  No significant abdominal pain aside from when he is trying to push to have a bowel movement.  Still is passing gas.  Has had an abdominal surgery but does not recall what it was.  Typically has a bowel movement every other day.  Denies any shortness of breath to me and says he is worried about his constipation today.       Home Medications Prior to Admission medications   Medication Sig Start Date End Date Taking? Authorizing Provider  albuterol  (VENTOLIN  HFA) 108 (90 Base) MCG/ACT inhaler Inhale 2 puffs into the lungs every 4 (four) hours as needed for wheezing or shortness of breath. Patient taking differently: Inhale 3-4 puffs into the lungs every 4 (four) hours as needed for wheezing or shortness of breath. 11/20/22   Kingsley, Victoria K, DO  ipratropium-albuterol  (DUONEB) 0.5-2.5 (3) MG/3ML SOLN Take 3 mLs by nebulization 4 (four) times daily. 08/02/22   Lorita Rosa, MD  OXYGEN  Inhale 1-3 L into the lungs continuous.    [provider]      Allergies    Patient has no known allergies.    Review of Systems   Review of Systems  Physical Exam Updated Vital Signs BP (!) 139/93 (BP Location: Right Arm)   Pulse 91   Temp 98.3 F (36.8 C) (Oral)   Resp (!) 21   SpO2 99%  Physical Exam Vitals and nursing note reviewed.  Constitutional:      General: He is not in acute  distress.    Appearance: He is well-developed.  HENT:     Head: Normocephalic and atraumatic.     Right Ear: External ear normal.     Left Ear: External ear normal.     Nose: Nose normal.  Eyes:     Extraocular Movements: Extraocular movements intact.     Conjunctiva/sclera: Conjunctivae normal.     Pupils: Pupils are equal, round, and reactive to light.  Cardiovascular:     Rate and Rhythm: Normal rate and regular rhythm.  Pulmonary:     Effort: Pulmonary effort is normal. No respiratory distress.     Comments: On 3 L home oxygen  Abdominal:     General: There is no distension.     Palpations: Abdomen is soft. There is no mass.     Tenderness: There is no abdominal tenderness. There is no guarding.  Musculoskeletal:     Cervical back: Normal range of motion and neck supple.  Skin:    General: Skin is warm and dry.  Neurological:     Mental Status: He is alert. Mental status is at baseline.  Psychiatric:        Mood and Affect: Mood normal.        Behavior: Behavior normal.     ED Results / Procedures / Treatments   Labs (all labs ordered are listed, but  only abnormal results are displayed) Labs Reviewed - No data to display  EKG None  Radiology No results found.  Procedures Procedures  {Document cardiac monitor, telemetry assessment procedure when appropriate:1}  Medications Ordered in ED Medications  polyethylene glycol (MIRALAX  / GLYCOLAX ) packet 17 g (has no administration in time range)    ED Course/ Medical Decision Making/ A&P   {   Click here for ABCD2, HEART and other calculatorsREFRESH Note before signing :1}                              Medical Decision Making Amount and/or Complexity of Data Reviewed Radiology: ordered.  Risk OTC drugs.   ***  {Document critical care time when appropriate:1} {Document review of labs and clinical decision tools ie heart score, Chads2Vasc2 etc:1}  {Document your independent review of radiology images, and  any outside records:1} {Document your discussion with family members, caretakers, and with consultants:1} {Document social determinants of health affecting pt's care:1} {Document your decision making why or why not admission, treatments were needed:1} Final Clinical Impression(s) / ED Diagnoses Final diagnoses:  None    Rx / DC Orders ED Discharge Orders     None

## 2023-06-24 NOTE — ED Notes (Signed)
 Wound noted to stump, Dr. Efraim Grange notified.

## 2023-07-05 ENCOUNTER — Other Ambulatory Visit: Payer: Self-pay

## 2023-07-05 ENCOUNTER — Emergency Department (HOSPITAL_COMMUNITY)
Admission: EM | Admit: 2023-07-05 | Discharge: 2023-07-06 | Disposition: A | Discharge status: Home / Self Care | Attending: Emergency Medicine | Admitting: Emergency Medicine

## 2023-07-05 ENCOUNTER — Emergency Department (HOSPITAL_COMMUNITY)

## 2023-07-05 DIAGNOSIS — J449 Chronic obstructive pulmonary disease, unspecified: Secondary | ICD-10-CM | POA: Insufficient documentation

## 2023-07-05 DIAGNOSIS — I10 Essential (primary) hypertension: Secondary | ICD-10-CM | POA: Diagnosis not present

## 2023-07-05 DIAGNOSIS — M86662 Other chronic osteomyelitis, left tibia and fibula: Secondary | ICD-10-CM | POA: Insufficient documentation

## 2023-07-05 DIAGNOSIS — M79605 Pain in left leg: Secondary | ICD-10-CM | POA: Diagnosis present

## 2023-07-05 LAB — CBC WITH DIFFERENTIAL/PLATELET
Abs Immature Granulocytes: 0.01 10*3/uL (ref 0.00–0.07)
Basophils Absolute: 0 10*3/uL (ref 0.0–0.1)
Basophils Relative: 1 %
Eosinophils Absolute: 0.2 10*3/uL (ref 0.0–0.5)
Eosinophils Relative: 3 %
HCT: 42.3 % (ref 39.0–52.0)
Hemoglobin: 12.6 g/dL — ABNORMAL LOW (ref 13.0–17.0)
Immature Granulocytes: 0 %
Lymphocytes Relative: 13 %
Lymphs Abs: 0.7 10*3/uL (ref 0.7–4.0)
MCH: 29.1 pg (ref 26.0–34.0)
MCHC: 29.8 g/dL — ABNORMAL LOW (ref 30.0–36.0)
MCV: 97.7 fL (ref 80.0–100.0)
Monocytes Absolute: 0.5 10*3/uL (ref 0.1–1.0)
Monocytes Relative: 10 %
Neutro Abs: 3.8 10*3/uL (ref 1.7–7.7)
Neutrophils Relative %: 73 %
Platelets: 152 10*3/uL (ref 150–400)
RBC: 4.33 MIL/uL (ref 4.22–5.81)
RDW: 15.1 % (ref 11.5–15.5)
WBC: 5.2 10*3/uL (ref 4.0–10.5)
nRBC: 0 % (ref 0.0–0.2)

## 2023-07-05 LAB — BASIC METABOLIC PANEL WITH GFR
Anion gap: 8 (ref 5–15)
BUN: 5 mg/dL — ABNORMAL LOW (ref 8–23)
CO2: 35 mmol/L — ABNORMAL HIGH (ref 22–32)
Calcium: 9.6 mg/dL (ref 8.9–10.3)
Chloride: 96 mmol/L — ABNORMAL LOW (ref 98–111)
Creatinine, Ser: 0.48 mg/dL — ABNORMAL LOW (ref 0.61–1.24)
GFR, Estimated: >60 mL/min (ref >60)
Glucose, Bld: 119 mg/dL — ABNORMAL HIGH (ref 70–99)
Potassium: 3.9 mmol/L (ref 3.5–5.1)
Sodium: 139 mmol/L (ref 135–145)

## 2023-07-05 MED ORDER — OXYCODONE-ACETAMINOPHEN 5-325 MG PO TABS
1.0000 | ORAL_TABLET | Freq: Once | ORAL | Status: AC
  Administered 2023-07-06: 1 via ORAL
  Filled 2023-07-05: qty 1

## 2023-07-05 MED ORDER — OXYCODONE-ACETAMINOPHEN 5-325 MG PO TABS
1.0000 | ORAL_TABLET | Freq: Once | ORAL | Status: AC
  Administered 2023-07-05: 1 via ORAL
  Filled 2023-07-05: qty 1

## 2023-07-05 NOTE — ED Notes (Signed)
 Provider ok for pt to go to the waiting room.

## 2023-07-05 NOTE — ED Provider Notes (Signed)
 [place] EMERGENCY DEPARTMENT AT [facility] Provider Note   CSN: [id number] Arrival date & time: 07/05/23  1805     History  Chief Complaint  Patient presents with   Pain    [name] is a 68 y.o. male.  The history is provided by the patient.  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  He has a chronic wound there.  He had been scheduled to follow-up with orthopedics but was unable to arrange transportation.  He denies fever or chills.   Home Medications Prior to Admission medications   Medication Sig Start Date End Date Taking? Authorizing Provider  albuterol  (VENTOLIN  HFA) 108 (90 Base) MCG/ACT inhaler Inhale 2 puffs into the lungs every 4 (four) hours as needed for wheezing or shortness of breath. Patient taking differently: Inhale 3-4 puffs into the lungs every 4 (four) hours as needed for wheezing or shortness of breath. 11/20/22   Kingsley, Victoria K, DO  ipratropium-albuterol  (DUONEB) 0.5-2.5 (3) MG/3ML SOLN Take 3 mLs by nebulization 4 (four) times daily. 08/02/22   Lorita Rosa, MD  OXYGEN  Inhale 1-3 L into the lungs continuous.    [provider]  polyethylene glycol (MIRALAX ) 17 g packet Take 17 g by mouth daily. 06/24/23   Ninetta Basket, MD      Allergies    Patient has no known allergies.    Review of Systems   Review of Systems  All other systems reviewed and are negative.   Physical Exam Updated Vital Signs BP (!) 143/98 (BP Location: Left Arm)   Pulse (!) 116   Temp 98 F (36.7 C)   Resp 19   Ht 5\' 6"  (1.676 m)   Wt 45 kg   SpO2 98%   BMI 16.01 kg/m  Physical Exam Vitals and nursing note reviewed.   68 year old male, resting comfortably and in no acute distress. Vital signs are significant for elevated blood pressure and heart rate. Oxygen  saturation  is 98%, which is normal. Head is normocephalic and atraumatic. PERRLA, EOMI.  Lungs are clear without rales, wheezes, or rhonchi. Heart has regular rate and rhythm without murmur. Abdomen is soft, flat, nontender. 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  There is a wound at the end of the stump with fat protruding.  There is slight erythema and warmth of the surrounding skin but no lymphangitic streaks.  The right leg is cool to the touch, no edema. Skin is warm and dry without other rash. Neurologic: Awake and alert.  ED Results / Procedures / Treatments   Labs (all labs ordered are listed, but only abnormal results are displayed) Labs Reviewed  BASIC METABOLIC PANEL WITH GFR - Abnormal; Notable for the following components:      Result Value   Chloride 96 (*)    CO2 35 (*)    Glucose, Bld 119 (*)    BUN 5 (*)    Creatinine, Ser 0.48 (*)    All other components within normal limits  CBC WITH DIFFERENTIAL/PLATELET - Abnormal; Notable for the following components:   Hemoglobin 12.6 (*)    MCHC 29.8 (*)    All other components within normal limits    EKG None  Radiology DG Femur 1V Left Result Date: 07/05/2023 CLINICAL DATA:  Stump wound EXAM: LEFT FEMUR 1 VIEW COMPARISON:  06/13/2023 FINDINGS: Changes  consistent with above knee amputation are again noted. No acute fracture or dislocation is seen. Progressive soft tissue wound over the distal aspect of the stump is noted with some suggestion bony erosive changes distally. IMPRESSION: Bony erosive changes are suggested distally. Soft tissue wound progression is noted. Electronically Signed   By: Violeta Grey M.D.   On: 07/05/2023 19:51    Procedures Procedures    Medications Ordered in ED Medications  oxyCODONE -acetaminophen  (PERCOCET/ROXICET) 5-325 MG per tablet 1 tablet (1 tablet Oral Given 07/05/23 1842)    ED Course/ Medical Decision Making/ A&P                                 Medical Decision  Making Amount and/or Complexity of Data Reviewed Radiology: ordered.  Risk Prescription drug management.   Chronic wound of the stump of the left leg with chronic pain.  I compared the findings today with a photo taken on/02/2023, and protrusion of fat from the wound is new, it appears to have progressed slightly.  Left femur x-ray shows bony erosive changes and soft tissue wound progression.  I have independently viewed the image, and agree with radiologist's interpretation.  I have reviewed his laboratory tests, and my interpretation is elevated CO2 consistent with history of COPD, elevated random glucose level which will need to be followed as an outpatient, normal WBC and differential.  I have ordered MRI of the femur to look for evidence of osteomyelitis.  I have reviewed his past records, and note hospitalization on 03/29/2023 at which time photos show no wound present on the stump where one is seen now.  He had negative MRI and was discharged on oral antibiotics and advised to follow-up with orthopedics, which never occurred.  MRI has been completed, reading pending.  Case is signed out to Dr. Leida Puna.  If no evidence of osteomyelitis, I feel he would need a course of antibiotics for cellulitis and referral to wound management.  If osteomyelitis is present, he will need to be admitted.  Final Clinical Impression(s) / ED Diagnoses Final diagnoses:  None    Rx / DC Orders ED Discharge Orders     None         Alissa April, MD 07/06/23 [phone]

## 2023-07-05 NOTE — ED Provider Triage Note (Addendum)
 Emergency Medicine Provider Triage Evaluation Note  [name] , a 68 y.o. male  was evaluated in triage.  Pt complains of pain in stump of L AKA. He states he has had pain for the last 1-2 months. He has a wound on the bottom of the stump that will scab over and then fall off. States his daughter helps with wound care at home. No fevers or drainage. No falls. Does not wear a prosthetic.  Review of Systems  Positive: Pain, wound Negative: Fever, drainage  Physical Exam  BP (!) 139/100   Pulse 90   Temp 98.4 F (36.9 C)   Resp (!) 24   Ht 5\' 6"  (1.676 m)   Wt 45 kg   SpO2 95%   BMI 16.01 kg/m  Gen:   Awake, no distress   Resp:  Normal effort  MSK:   Moves extremities without difficulty  Other:  ~2 cm superficial wound at stump of wound, mild erythema and warmth to touch, no drainage   Medical Decision Making  Medically screening exam initiated at 6:24 PM.  Appropriate orders placed.  [name] was informed that the remainder of the evaluation will be completed by another provider, this initial triage assessment does not replace that evaluation, and the importance of remaining in the ED until their evaluation is complete.  Xray and labs ordered.   Kingsley, Sabatino Williard K, DO 07/05/23 1830    Kingsley, Brandonlee Navis K, DO 07/05/23 [phone]

## 2023-07-05 NOTE — ED Triage Notes (Addendum)
 Pain left stump x 5 months. Small wound/evisceration noted to the site that pt reports popped open today. No fevers.

## 2023-07-06 ENCOUNTER — Emergency Department (HOSPITAL_COMMUNITY)

## 2023-07-06 DIAGNOSIS — M86662 Other chronic osteomyelitis, left tibia and fibula: Secondary | ICD-10-CM | POA: Diagnosis not present

## 2023-07-06 MED ORDER — BUDESONIDE 0.5 MG/2ML IN SUSP: 0.5000 mg | Freq: Two times a day (BID) | RESPIRATORY_TRACT | 12 refills | Status: DC

## 2023-07-06 MED ORDER — IPRATROPIUM-ALBUTEROL 0.5-2.5 (3) MG/3ML IN SOLN: 3.0000 mL | Freq: Four times a day (QID) | RESPIRATORY_TRACT | 1 refills | Status: DC

## 2023-07-06 MED ORDER — ALBUTEROL SULFATE HFA 108 (90 BASE) MCG/ACT IN AERS
2.0000 | INHALATION_SPRAY | RESPIRATORY_TRACT | Status: DC | PRN
  Administered 2023-07-06: 2 via RESPIRATORY_TRACT
  Filled 2023-07-06: qty 6.7

## 2023-07-06 MED ORDER — DOXYCYCLINE HYCLATE 100 MG PO CAPS: 100.0000 mg | ORAL_CAPSULE | Freq: Two times a day (BID) | ORAL | 0 refills | Status: DC

## 2023-07-06 MED ORDER — DOXYCYCLINE HYCLATE 100 MG PO TABS
100.0000 mg | ORAL_TABLET | Freq: Once | ORAL | Status: AC
  Administered 2023-07-06: 100 mg via ORAL
  Filled 2023-07-06: qty 1

## 2023-07-06 MED ORDER — GADOBUTROL 1 MMOL/ML IV SOLN
4.0000 mL | Freq: Once | INTRAVENOUS | Status: AC | PRN
  Administered 2023-07-06: 4 mL via INTRAVENOUS

## 2023-07-06 NOTE — ED Notes (Signed)
 Pt LLE wound cleansed and dressing applied.

## 2023-07-06 NOTE — Discharge Instructions (Addendum)
 You will take the antibiotic 2 times a day until it is gone.  Put a new dressing on your stump every day

## 2023-07-06 NOTE — ED Notes (Signed)
 Patient is resting comfortably, states no needs at this time.

## 2023-07-06 NOTE — ED Provider Notes (Signed)
 MRI returned which radiology readThe patient has a history of a cemented intramedullary knee  arthrodesis nail system and subsequent above the knee amputation at  approximately the mid femoral level.  2. Extending within the distal 8 cm of the femur, low signal  intensity along the medullary spaces compatible with prior  methacrylate. At the distal bony margin of the native femur this  filled cylindrical plug of methacrylate transitions to a semi lunate  cross-sectional profile, and along the anterior concave margin is a  collection of enhancement and increased T2 signal along with chronic  erosive findings in the anterior femoral cortex/bone favoring  chronic osteomyelitis and underlying granulation tissue. The plug of  presumed methacrylate (vs devascularized graft) surrounded by fluid  signal intensity extends beyond the distal bony margin of the native  femur and extends down almost to the distal cutaneous margin of the  stump, with surrounding fluid, and continuity of the fluid signal  and enhancement around this plug of presumed methacrylate with the  cutaneous surface is not excluded.  3. Low-level edema tracking within along the hip adductor  musculature. Edema signal along the vastus musculature may be from  denervation, with infectious myositis possible but less likely given  the lack of substantial associated enhancement.  4. Mild spurring of the left femoral head acetabulum.  Discussed the findings with orthopedist PA Greta Leatherwood who spoke with Dr. Julio Ohm who evaluated MRI reads and recommends that patient needs doxycycline  and follow-up next week in the clinic.  Patient does not appear systemically ill at this time.  In speaking with him he reports he is out of all of his medications.  He was given an inhaler here but wears oxygen  chronically.  He has a motorized and nonmotorized wheelchair at home.  He is not using any prosthetics at this time.   Almond Army,  MD 07/06/23 1126

## 2023-07-06 NOTE — ED Notes (Signed)
 Patient transported to MRI

## 2023-07-06 NOTE — ED Notes (Signed)
 PLEASE UPDATE DAUGHTER [phone]

## 2023-07-06 NOTE — ED Notes (Signed)
Patient back from MRI, resting in bed in NAD.

## 2023-09-05 ENCOUNTER — Other Ambulatory Visit: Payer: Self-pay

## 2023-09-05 ENCOUNTER — Encounter (HOSPITAL_COMMUNITY): Payer: Self-pay

## 2023-09-05 ENCOUNTER — Inpatient Hospital Stay (HOSPITAL_COMMUNITY)
Admission: EM | Admit: 2023-09-05 | Discharge: 2023-09-09 | DRG: 565 | Disposition: A | Discharge status: Skilled Nursing Facility | Attending: Internal Medicine | Admitting: Internal Medicine

## 2023-09-05 ENCOUNTER — Emergency Department (HOSPITAL_COMMUNITY)

## 2023-09-05 DIAGNOSIS — F1410 Cocaine abuse, uncomplicated: Secondary | ICD-10-CM | POA: Diagnosis present

## 2023-09-05 DIAGNOSIS — Z96652 Presence of left artificial knee joint: Secondary | ICD-10-CM | POA: Diagnosis present

## 2023-09-05 DIAGNOSIS — L0390 Cellulitis, unspecified: Secondary | ICD-10-CM | POA: Diagnosis present

## 2023-09-05 DIAGNOSIS — I10 Essential (primary) hypertension: Secondary | ICD-10-CM | POA: Diagnosis present

## 2023-09-05 DIAGNOSIS — Z79899 Other long term (current) drug therapy: Secondary | ICD-10-CM

## 2023-09-05 DIAGNOSIS — Z1152 Encounter for screening for COVID-19: Secondary | ICD-10-CM | POA: Diagnosis not present

## 2023-09-05 DIAGNOSIS — Z9981 Dependence on supplemental oxygen: Secondary | ICD-10-CM | POA: Diagnosis not present

## 2023-09-05 DIAGNOSIS — L600 Ingrowing nail: Secondary | ICD-10-CM | POA: Diagnosis present

## 2023-09-05 DIAGNOSIS — R569 Unspecified convulsions: Secondary | ICD-10-CM | POA: Diagnosis present

## 2023-09-05 DIAGNOSIS — Z635 Disruption of family by separation and divorce: Secondary | ICD-10-CM

## 2023-09-05 DIAGNOSIS — L97129 Non-pressure chronic ulcer of left thigh with unspecified severity: Secondary | ICD-10-CM | POA: Diagnosis present

## 2023-09-05 DIAGNOSIS — F17210 Nicotine dependence, cigarettes, uncomplicated: Secondary | ICD-10-CM | POA: Diagnosis present

## 2023-09-05 DIAGNOSIS — J441 Chronic obstructive pulmonary disease with (acute) exacerbation: Secondary | ICD-10-CM | POA: Diagnosis not present

## 2023-09-05 DIAGNOSIS — T8781 Dehiscence of amputation stump: Secondary | ICD-10-CM | POA: Diagnosis present

## 2023-09-05 DIAGNOSIS — Z66 Do not resuscitate: Secondary | ICD-10-CM | POA: Diagnosis not present

## 2023-09-05 DIAGNOSIS — J449 Chronic obstructive pulmonary disease, unspecified: Secondary | ICD-10-CM | POA: Diagnosis present

## 2023-09-05 DIAGNOSIS — E875 Hyperkalemia: Secondary | ICD-10-CM | POA: Diagnosis present

## 2023-09-05 DIAGNOSIS — M1990 Unspecified osteoarthritis, unspecified site: Secondary | ICD-10-CM | POA: Diagnosis present

## 2023-09-05 DIAGNOSIS — R0602 Shortness of breath: Secondary | ICD-10-CM | POA: Diagnosis present

## 2023-09-05 DIAGNOSIS — Z7951 Long term (current) use of inhaled steroids: Secondary | ICD-10-CM | POA: Diagnosis not present

## 2023-09-05 DIAGNOSIS — T8149XA Infection following a procedure, other surgical site, initial encounter: Secondary | ICD-10-CM

## 2023-09-05 DIAGNOSIS — W450XXA Nail entering through skin, initial encounter: Secondary | ICD-10-CM

## 2023-09-05 DIAGNOSIS — R042 Hemoptysis: Secondary | ICD-10-CM | POA: Diagnosis not present

## 2023-09-05 DIAGNOSIS — Y835 Amputation of limb(s) as the cause of abnormal reaction of the patient, or of later complication, without mention of misadventure at the time of the procedure: Secondary | ICD-10-CM | POA: Diagnosis present

## 2023-09-05 DIAGNOSIS — J9611 Chronic respiratory failure with hypoxia: Secondary | ICD-10-CM | POA: Diagnosis present

## 2023-09-05 DIAGNOSIS — T8744 Infection of amputation stump, left lower extremity: Secondary | ICD-10-CM | POA: Diagnosis present

## 2023-09-05 DIAGNOSIS — T879 Unspecified complications of amputation stump: Secondary | ICD-10-CM

## 2023-09-05 DIAGNOSIS — I739 Peripheral vascular disease, unspecified: Secondary | ICD-10-CM | POA: Diagnosis present

## 2023-09-05 DIAGNOSIS — L03116 Cellulitis of left lower limb: Secondary | ICD-10-CM | POA: Diagnosis present

## 2023-09-05 DIAGNOSIS — T7601XA Adult neglect or abandonment, suspected, initial encounter: Secondary | ICD-10-CM | POA: Diagnosis present

## 2023-09-05 DIAGNOSIS — L602 Onychogryphosis: Secondary | ICD-10-CM | POA: Diagnosis present

## 2023-09-05 DIAGNOSIS — E785 Hyperlipidemia, unspecified: Secondary | ICD-10-CM | POA: Diagnosis present

## 2023-09-05 DIAGNOSIS — I70261 Atherosclerosis of native arteries of extremities with gangrene, right leg: Secondary | ICD-10-CM | POA: Diagnosis not present

## 2023-09-05 DIAGNOSIS — R54 Age-related physical debility: Secondary | ICD-10-CM | POA: Diagnosis present

## 2023-09-05 DIAGNOSIS — Z91199 Patient's noncompliance with other medical treatment and regimen due to unspecified reason: Secondary | ICD-10-CM

## 2023-09-05 LAB — CBC WITH DIFFERENTIAL/PLATELET
Abs Immature Granulocytes: 0.01 K/uL (ref 0.00–0.07)
Basophils Absolute: 0 K/uL (ref 0.0–0.1)
Basophils Relative: 0 %
Eosinophils Absolute: 0.1 K/uL (ref 0.0–0.5)
Eosinophils Relative: 3 %
HCT: 41.3 % (ref 39.0–52.0)
Hemoglobin: 12.7 g/dL — ABNORMAL LOW (ref 13.0–17.0)
Immature Granulocytes: 0 %
Lymphocytes Relative: 17 %
Lymphs Abs: 0.9 K/uL (ref 0.7–4.0)
MCH: 30.2 pg (ref 26.0–34.0)
MCHC: 30.8 g/dL (ref 30.0–36.0)
MCV: 98.1 fL (ref 80.0–100.0)
Monocytes Absolute: 0.4 K/uL (ref 0.1–1.0)
Monocytes Relative: 8 %
Neutro Abs: 4.1 K/uL (ref 1.7–7.7)
Neutrophils Relative %: 72 %
Platelets: 181 K/uL (ref 150–400)
RBC: 4.21 MIL/uL — ABNORMAL LOW (ref 4.22–5.81)
RDW: 13.8 % (ref 11.5–15.5)
WBC: 5.6 K/uL (ref 4.0–10.5)
nRBC: 0 % (ref 0.0–0.2)

## 2023-09-05 LAB — I-STAT CHEM 8, ED
BUN: 16 mg/dL (ref 8–23)
Calcium, Ion: 1.14 mmol/L — ABNORMAL LOW (ref 1.15–1.40)
Chloride: 97 mmol/L — ABNORMAL LOW (ref 98–111)
Creatinine, Ser: 0.5 mg/dL — ABNORMAL LOW (ref 0.61–1.24)
Glucose, Bld: 91 mg/dL (ref 70–99)
HCT: 40 % (ref 39.0–52.0)
Hemoglobin: 13.6 g/dL (ref 13.0–17.0)
Potassium: 5.5 mmol/L — ABNORMAL HIGH (ref 3.5–5.1)
Sodium: 138 mmol/L (ref 135–145)
TCO2: 38 mmol/L — ABNORMAL HIGH (ref 22–32)

## 2023-09-05 LAB — COMPREHENSIVE METABOLIC PANEL WITH GFR
ALT: 12 U/L (ref 0–44)
AST: 13 U/L — ABNORMAL LOW (ref 15–41)
Albumin: 3.5 g/dL (ref 3.5–5.0)
Alkaline Phosphatase: 61 U/L (ref 38–126)
Anion gap: 10 (ref 5–15)
BUN: 12 mg/dL (ref 8–23)
CO2: 35 mmol/L — ABNORMAL HIGH (ref 22–32)
Calcium: 9.5 mg/dL (ref 8.9–10.3)
Chloride: 94 mmol/L — ABNORMAL LOW (ref 98–111)
Creatinine, Ser: 0.51 mg/dL — ABNORMAL LOW (ref 0.61–1.24)
GFR, Estimated: >60 mL/min (ref >60)
Glucose, Bld: 217 mg/dL — ABNORMAL HIGH (ref 70–99)
Potassium: 4.1 mmol/L (ref 3.5–5.1)
Sodium: 139 mmol/L (ref 135–145)
Total Bilirubin: 0.5 mg/dL (ref 0.0–1.2)
Total Protein: 6.6 g/dL (ref 6.5–8.1)

## 2023-09-05 LAB — TROPONIN I (HIGH SENSITIVITY)
Troponin I (High Sensitivity): 6 ng/L (ref <18)
Troponin I (High Sensitivity): 6 ng/L (ref <18)

## 2023-09-05 LAB — HIV ANTIBODY (ROUTINE TESTING W REFLEX): HIV Screen 4th Generation wRfx: NONREACTIVE

## 2023-09-05 LAB — RESP PANEL BY RT-PCR (RSV, FLU A&B, COVID)  RVPGX2
Influenza A by PCR: NEGATIVE
Influenza B by PCR: NEGATIVE
Resp Syncytial Virus by PCR: NEGATIVE
SARS Coronavirus 2 by RT PCR: NEGATIVE

## 2023-09-05 LAB — BRAIN NATRIURETIC PEPTIDE: B Natriuretic Peptide: 8.9 pg/mL (ref 0.0–100.0)

## 2023-09-05 MED ORDER — SODIUM CHLORIDE 0.9 % IV SOLN
2.0000 g | Freq: Two times a day (BID) | INTRAVENOUS | Status: DC
  Administered 2023-09-06: 2 g via INTRAVENOUS
  Filled 2023-09-05: qty 12.5

## 2023-09-05 MED ORDER — IPRATROPIUM-ALBUTEROL 0.5-2.5 (3) MG/3ML IN SOLN
3.0000 mL | Freq: Once | RESPIRATORY_TRACT | Status: AC
  Administered 2023-09-05: 3 mL via RESPIRATORY_TRACT
  Filled 2023-09-05: qty 3

## 2023-09-05 MED ORDER — BUDESONIDE 0.5 MG/2ML IN SUSP
0.5000 mg | Freq: Two times a day (BID) | RESPIRATORY_TRACT | Status: DC
  Administered 2023-09-05 – 2023-09-08 (×4): 0.5 mg via RESPIRATORY_TRACT
  Filled 2023-09-05 (×5): qty 2

## 2023-09-05 MED ORDER — VANCOMYCIN HCL 750 MG/150ML IV SOLN: 750.0000 mg | INTRAVENOUS | Status: DC

## 2023-09-05 MED ORDER — NICOTINE 7 MG/24HR TD PT24
7.0000 mg | MEDICATED_PATCH | Freq: Every day | TRANSDERMAL | Status: DC
  Administered 2023-09-05 – 2023-09-07 (×3): 7 mg via TRANSDERMAL
  Filled 2023-09-05 (×4): qty 1

## 2023-09-05 MED ORDER — IPRATROPIUM-ALBUTEROL 0.5-2.5 (3) MG/3ML IN SOLN: 3.0000 mL | Freq: Four times a day (QID) | RESPIRATORY_TRACT | Status: DC

## 2023-09-05 MED ORDER — VANCOMYCIN HCL IN DEXTROSE 1-5 GM/200ML-% IV SOLN: 1000.0000 mg | Freq: Once | INTRAVENOUS | Status: DC

## 2023-09-05 MED ORDER — SODIUM CHLORIDE 0.9 % IV SOLN
2.0000 g | Freq: Once | INTRAVENOUS | Status: AC
  Administered 2023-09-05: 2 g via INTRAVENOUS
  Filled 2023-09-05: qty 20

## 2023-09-05 MED ORDER — METHYLPREDNISOLONE SODIUM SUCC 125 MG IJ SOLR
125.0000 mg | Freq: Once | INTRAMUSCULAR | Status: AC
  Administered 2023-09-05: 125 mg via INTRAVENOUS
  Filled 2023-09-05: qty 2

## 2023-09-05 MED ORDER — SODIUM CHLORIDE 0.9 % IV SOLN
2.0000 g | Freq: Once | INTRAVENOUS | Status: AC
  Administered 2023-09-05: 2 g via INTRAVENOUS
  Filled 2023-09-05: qty 12.5

## 2023-09-05 MED ORDER — IPRATROPIUM-ALBUTEROL 0.5-2.5 (3) MG/3ML IN SOLN
3.0000 mL | Freq: Four times a day (QID) | RESPIRATORY_TRACT | Status: DC
  Administered 2023-09-06: 3 mL via RESPIRATORY_TRACT
  Filled 2023-09-05: qty 3

## 2023-09-05 MED ORDER — HEPARIN SODIUM (PORCINE) 5000 UNIT/ML IJ SOLN
5000.0000 [IU] | Freq: Three times a day (TID) | INTRAMUSCULAR | Status: DC
  Administered 2023-09-05 – 2023-09-08 (×10): 5000 [IU] via SUBCUTANEOUS
  Filled 2023-09-05 (×10): qty 1

## 2023-09-05 MED ORDER — IPRATROPIUM-ALBUTEROL 0.5-2.5 (3) MG/3ML IN SOLN
3.0000 mL | Freq: Once | RESPIRATORY_TRACT | Status: DC
  Filled 2023-09-05: qty 3

## 2023-09-05 MED ORDER — ALBUTEROL SULFATE (2.5 MG/3ML) 0.083% IN NEBU: 3.0000 mL | INHALATION_SOLUTION | RESPIRATORY_TRACT | Status: DC | PRN

## 2023-09-05 NOTE — ED Triage Notes (Signed)
 BIB [facility] from home (extended stay motel). Complaining of SOB. 2L [place] baseline. EMS increased to 4L due to initial SPO2 87%. Pt has left above knee amputation. Puss, drainage, and foul odor coming from left stump. Pt states he can't go back to extended stay motel and needs placement.

## 2023-09-05 NOTE — ED Provider Notes (Signed)
 [place] EMERGENCY DEPARTMENT AT [facility] Provider Note   CSN: [id number] Arrival date & time: 09/05/23  1326     Patient presents with: Shortness of Breath   [name] is a 68 y.o. male.   Patient here with shortness of breath, wheezing.  He is on 2 L of oxygen  at baseline, having to increase it to 4 L with EMS his oxygen  was in the upper 80s.  He has been having increased cough.  He is having pus and drainage and foul odor coming from his left above-the-knee amputation site as well here recently.  He has been staying at an extended stay motel.  He is having a hard time taking care of himself as well as family having a hard time taking care of himself.  He has not had any fevers or chills.  He has been struggling with wound care.  He is not on any antibiotics.  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  The history is provided by the patient.       Prior to Admission medications   Medication Sig Start Date End Date Taking? Authorizing Provider  albuterol  (VENTOLIN  HFA) 108 (90 Base) MCG/ACT inhaler Inhale 2 puffs into the lungs every 4 (four) hours as needed for wheezing or shortness of breath. Patient taking differently: Inhale 3-4 puffs into the lungs every 4 (four) hours as needed for wheezing or shortness of breath. 11/20/22   Kingsley, Victoria K, DO  budesonide  (PULMICORT ) 0.5 MG/2ML nebulizer solution Take 2 mLs (0.5 mg total) by nebulization 2 (two) times daily. 07/06/23   [name], MD  doxycycline  (VIBRAMYCIN ) 100 MG capsule Take 1 capsule (100 mg total) by mouth 2 (two) times daily. 07/06/23   [name], MD  ipratropium-albuterol  (DUONEB) 0.5-2.5 (3) MG/3ML SOLN Take 3 mLs by nebulization 4 (four) times daily. 07/06/23   [name], MD  OXYGEN  Inhale 1-3 L into the lungs continuous.    [provider]  polyethylene glycol (MIRALAX ) 17 g packet Take 17 g by mouth daily. 06/24/23   [name], MD     Allergies: Patient has no known allergies.    Review of Systems  Updated Vital Signs BP 119/89   Pulse 96   Temp 98 F (36.7 C) (Oral)   Resp 14   Ht 5' 6 (1.676 m)   Wt 45 kg   SpO2 100%   BMI 16.01 kg/m   Physical Exam Vitals and nursing note reviewed.  Constitutional:      General: He is not in acute distress.    Appearance: He is well-developed.  HENT:     Head: Normocephalic and atraumatic.  Eyes:     Extraocular Movements: Extraocular movements intact.     Conjunctiva/sclera: Conjunctivae normal.     Pupils: Pupils are equal, round, and reactive to light.  Cardiovascular:     Rate and Rhythm: Normal rate and regular rhythm.     Pulses: Normal pulses.     Heart sounds: No murmur heard. Pulmonary:     Effort: Tachypnea present. No respiratory distress.     Breath sounds: Decreased breath sounds and wheezing present.  Abdominal:     Palpations: Abdomen is soft.     Tenderness: There is no abdominal tenderness.  Musculoskeletal:        General: No swelling.     Cervical back: Normal range of motion and neck supple.  Skin:    General: Skin is warm and  dry.     Capillary Refill: Capillary refill takes less than 2 seconds.     Comments: He is got some mild purulence coming from his above-knee amputation of the left foot there is no major fluctuance or obvious abscess  Neurological:     Mental Status: He is alert.  Psychiatric:        Mood and Affect: Mood normal.     (all labs ordered are listed, but only abnormal results are displayed) Labs Reviewed  CBC WITH DIFFERENTIAL/PLATELET - Abnormal; Notable for the following components:      Result Value   RBC 4.21 (*)    Hemoglobin 12.7 (*)    All other components within normal limits  RESP PANEL BY RT-PCR (RSV, FLU A&B, COVID)  RVPGX2  BRAIN NATRIURETIC PEPTIDE  COMPREHENSIVE METABOLIC PANEL WITH GFR  I-STAT CHEM 8, ED  TROPONIN I (HIGH SENSITIVITY)    EKG: EKG Interpretation Date/Time:  Tuesday  September 05 2023 13:39:41 EDT Ventricular Rate:  96 PR Interval:  154 QRS Duration:  75 QT Interval:  330 QTC Calculation: 417 R Axis:   61  Text Interpretation: Sinus rhythm Confirmed by Ruthe Cornet [phone]) on 09/05/2023 1:45:25 PM  Radiology: ARCOLA HIP UNILAT WITH PELVIS 2-3 VIEWS LEFT Result Date: 09/05/2023 CLINICAL DATA:  Stump pain. EXAM: DG HIP (WITH OR WITHOUT PELVIS) 2-3V LEFT COMPARISON:  May 05, 2023. FINDINGS: Status post above knee amputation of left lower extremity. Vascular calcifications are noted. No definite acute lytic destruction or fracture is noted. There does appear to be wound or ulceration involving the distal soft tissues of the stump. IMPRESSION: Probable wound or ulceration involving distal soft tissues of stump. No acute lytic destruction or fracture is noted. Electronically Signed   By: Lynwood Landy Raddle M.D.   On: 09/05/2023 14:22   DG Chest Portable 1 View Result Date: 09/05/2023 CLINICAL DATA:  Shortness of breath EXAM: PORTABLE CHEST 1 VIEW COMPARISON:  Chest radiograph dated 06/13/2023 FINDINGS: Normal lung volumes. Diffuse lucency of bilateral lungs. No pleural effusion or pneumothorax. The heart size and mediastinal contours are within normal limits. Old left clavicle fracture deformity. IMPRESSION: Diffuse lucency of bilateral lungs, which may be seen in the setting of emphysema. No focal consolidation. Electronically Signed   By: Limin  Xu M.D.   On: 09/05/2023 14:20     Procedures   Medications Ordered in the ED  cefTRIAXone  (ROCEPHIN ) 2 g in sodium chloride  0.9 % 100 mL IVPB (has no administration in time range)  ipratropium-albuterol  (DUONEB) 0.5-2.5 (3) MG/3ML nebulizer solution 3 mL (has no administration in time range)  ipratropium-albuterol  (DUONEB) 0.5-2.5 (3) MG/3ML nebulizer solution 3 mL (3 mLs Nebulization Given 09/05/23 1356)  methylPREDNISolone  sodium succinate  (SOLU-MEDROL ) 125 mg/2 mL injection 125 mg (125 mg Intravenous Given 09/05/23 1356)                                     Medical Decision Making Amount and/or Complexity of Data Reviewed Labs: ordered. Radiology: ordered.  Risk Prescription drug management. Decision regarding hospitalization.   [name] is here with shortness of breath cough.  Here also with concern for cellulitis skin infection.  Normal vitals.  No fever.  History of COPD on 2 L of oxygen .  Oxygen  mid 80s on his home oxygen  and increased to 4 L with EMS.  He has some drainage coming from his left above-knee amputation site here  recently.  Not on any antibiotics.  He is also living out of short stay/extended stay hotel.  Having a hard time taking care of himself.  He is got drainage from his above-knee amputation site.  Some purulence.  Foul smell.  No obvious abscess.  Is no fever.  Will get x-ray here and check basic labs.  He has diminished breath sounds poor air movement wheezing.  Mild increased work of breathing.  I think he likely has a COPD exacerbation seems less likely to be ACS heart failure.  Could be pneumonia.  I do think he has likely localized cellulitis/wound infection.  Likely would benefit from wound care and some antibiotics.  I do not think he is septic.  Ultimately I think he will need admission.  Will check labs x-rays give breathing treatment and IV Solu-Medrol  and reevaluate.  Per my review interpretation of labs he has no significant leukocytosis or anemia.  Troponin is 6.  Chest x-ray shows no evidence of pneumonia or pneumothorax.  X-ray of his wound site does not show any obvious osteomyelitis or other major process.  I think he just has a localized infection to his wound site.  I given him a dose of IV Rocephin .  His BMP hemolyzed have ordered an i-STAT Chem-8.  Ultimately I think he would benefit from observation stay for some supportive care from his COPD and from some IV antibiotics and wound management.  He probably needs some better outpatient resources as well.  I have  messaged orthopedic team/Dr. Crist team Ozell Ned to make them aware that patient is being admitted.  This chart was dictated using voice recognition software.  Despite best efforts to proofread,  errors can occur which can change the documentation meaning.      Final diagnoses:  COPD exacerbation (HCC)  Wound infection after surgery    ED Discharge Orders     None          Ruthe Cornet, DO 09/05/23 1452

## 2023-09-05 NOTE — Progress Notes (Signed)
 Pharmacy Antibiotic Note  [name] is a 68 y.o. male for which pharmacy has been consulted for cefepime  and vancomycin  dosing for cellulitis.  Patient with a history of polysubstance abuse, HTN, HLD, PAD w/ left AKA, COPD. Patient presenting with stump cellulitis.  SCr 0.5 - baseline WBC 5.6; T 98.2; HR 96; RR 16  Plan: Cefepime  2g q12hr  Vancomycin  1000 mg once then 750 mg q24hr (eAUC 453.1) unless change in renal function Monitor WBC, fever, renal function, cultures De-escalate when able Levels at steady state  Height: 5' 6 (167.6 cm) Weight: 45 kg (99 lb 3.3 oz) IBW/kg (Calculated) : 63.8  Temp (24hrs), Avg:98 F (36.7 C), Min:98 F (36.7 C), Max:98 F (36.7 C)  Recent Labs  Lab 09/05/23 1344 09/05/23 1509  WBC 5.6  --   CREATININE  --  0.50*    Estimated Creatinine Clearance: 56.3 mL/min (A) (by C-G formula based on SCr of 0.5 mg/dL (L)).    No Known Allergies  Microbiology results: Pending  Thank you for allowing pharmacy to be a part of this patient's care.  Dorn Buttner, PharmD, BCPS 09/05/2023 3:51 PM ED Clinical Pharmacist -  [phone]
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
## 2023-09-05 NOTE — Hospital Course (Signed)
 [name]

## 2023-09-06 ENCOUNTER — Inpatient Hospital Stay (HOSPITAL_COMMUNITY)

## 2023-09-06 DIAGNOSIS — I70261 Atherosclerosis of native arteries of extremities with gangrene, right leg: Secondary | ICD-10-CM | POA: Diagnosis not present

## 2023-09-06 DIAGNOSIS — T8781 Dehiscence of amputation stump: Secondary | ICD-10-CM | POA: Diagnosis not present

## 2023-09-06 LAB — COMPREHENSIVE METABOLIC PANEL WITH GFR
ALT: 12 U/L (ref 0–44)
AST: 14 U/L — ABNORMAL LOW (ref 15–41)
Albumin: 3 g/dL — ABNORMAL LOW (ref 3.5–5.0)
Alkaline Phosphatase: 51 U/L (ref 38–126)
Anion gap: 11 (ref 5–15)
BUN: 14 mg/dL (ref 8–23)
CO2: 32 mmol/L (ref 22–32)
Calcium: 9.1 mg/dL (ref 8.9–10.3)
Chloride: 94 mmol/L — ABNORMAL LOW (ref 98–111)
Creatinine, Ser: 0.4 mg/dL — ABNORMAL LOW (ref 0.61–1.24)
GFR, Estimated: >60 mL/min (ref >60)
Glucose, Bld: 171 mg/dL — ABNORMAL HIGH (ref 70–99)
Potassium: 4.3 mmol/L (ref 3.5–5.1)
Sodium: 137 mmol/L (ref 135–145)
Total Bilirubin: 0.6 mg/dL (ref 0.0–1.2)
Total Protein: 5.8 g/dL — ABNORMAL LOW (ref 6.5–8.1)

## 2023-09-06 LAB — CBC
HCT: 36.8 % — ABNORMAL LOW (ref 39.0–52.0)
Hemoglobin: 11.5 g/dL — ABNORMAL LOW (ref 13.0–17.0)
MCH: 30.2 pg (ref 26.0–34.0)
MCHC: 31.3 g/dL (ref 30.0–36.0)
MCV: 96.6 fL (ref 80.0–100.0)
Platelets: 187 K/uL (ref 150–400)
RBC: 3.81 MIL/uL — ABNORMAL LOW (ref 4.22–5.81)
RDW: 13.2 % (ref 11.5–15.5)
WBC: 4.2 K/uL (ref 4.0–10.5)
nRBC: 0 % (ref 0.0–0.2)

## 2023-09-06 MED ORDER — ENSURE PLUS HIGH PROTEIN PO LIQD
237.0000 mL | Freq: Three times a day (TID) | ORAL | Status: DC
  Administered 2023-09-06 – 2023-09-08 (×9): 237 mL via ORAL

## 2023-09-06 MED ORDER — VANCOMYCIN HCL IN DEXTROSE 1-5 GM/200ML-% IV SOLN: 1000.0000 mg | Freq: Once | INTRAVENOUS | Status: DC

## 2023-09-06 MED ORDER — DOXYCYCLINE HYCLATE 100 MG PO TABS
100.0000 mg | ORAL_TABLET | Freq: Two times a day (BID) | ORAL | Status: DC
  Administered 2023-09-06 – 2023-09-08 (×5): 100 mg via ORAL
  Filled 2023-09-06 (×5): qty 1

## 2023-09-06 MED ORDER — ALUM & MAG HYDROXIDE-SIMETH 200-200-20 MG/5ML PO SUSP
30.0000 mL | Freq: Four times a day (QID) | ORAL | Status: DC | PRN
  Administered 2023-09-06 – 2023-09-08 (×7): 30 mL via ORAL
  Filled 2023-09-06 (×7): qty 30

## 2023-09-06 MED ORDER — JUVEN PO PACK
1.0000 | PACK | Freq: Two times a day (BID) | ORAL | Status: DC
  Administered 2023-09-06 – 2023-09-08 (×6): 1 via ORAL
  Filled 2023-09-06 (×6): qty 1

## 2023-09-06 MED ORDER — FAMOTIDINE 20 MG PO TABS
20.0000 mg | ORAL_TABLET | Freq: Every day | ORAL | Status: DC
  Administered 2023-09-06 – 2023-09-08 (×3): 20 mg via ORAL
  Filled 2023-09-06 (×3): qty 1

## 2023-09-06 MED ORDER — IPRATROPIUM-ALBUTEROL 0.5-2.5 (3) MG/3ML IN SOLN
3.0000 mL | Freq: Four times a day (QID) | RESPIRATORY_TRACT | Status: DC
  Administered 2023-09-07: 3 mL via RESPIRATORY_TRACT
  Filled 2023-09-06: qty 3

## 2023-09-06 MED ORDER — DOXYCYCLINE HYCLATE 100 MG PO TABS: 100.0000 mg | ORAL_TABLET | Freq: Two times a day (BID) | ORAL | Status: DC

## 2023-09-06 MED ORDER — ADULT MULTIVITAMIN W/MINERALS CH
1.0000 | ORAL_TABLET | Freq: Every day | ORAL | Status: DC
  Administered 2023-09-06 – 2023-09-08 (×3): 1 via ORAL
  Filled 2023-09-06 (×3): qty 1

## 2023-09-06 MED ORDER — VANCOMYCIN HCL 750 MG/150ML IV SOLN: 750.0000 mg | INTRAVENOUS | Status: DC

## 2023-09-06 NOTE — Progress Notes (Signed)
 ABI has been completed.   Results can be found under chart review under CV PROC. 09/06/2023 4:26 PM Tammee Thielke RVT, RDMS

## 2023-09-06 NOTE — Progress Notes (Signed)
 Transition of Care [facility]) - Inpatient Brief Assessment   Patient Details  Name: [name] MRN: [id number] Date of Birth: 1955/10/17  [facility] CM/SW Contact:    Rosaline JONELLE Joe, RN Phone Number: 09/06/2023, 9:15 AM   Clinical Narrative: Patient admitted to the hospital from extended hotel with cellulitis of BKA stump.  Patient was seen by Dr. Harden and daily dressing changes are recommended.  PT/OT evaluation is pending at this time.  IP Care management team will follow for [facility] needs for the patient.   Transition of Care Asessment: Insurance and Status: (P) Insurance coverage has been reviewed Patient has primary care physician: (P) Yes Home environment has been reviewed: (P) from extended stay hotel Prior level of function:: (P) self Prior/Current Home Services: (P) No current home services Social Drivers of Health Review: (P) SDOH reviewed needs interventions Readmission risk has been reviewed: (P) Yes Transition of care needs: (P) transition of care needs identified, TOC will continue to follow

## 2023-09-06 NOTE — Plan of Care (Signed)
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
## 2023-09-07 ENCOUNTER — Inpatient Hospital Stay (HOSPITAL_COMMUNITY)

## 2023-09-07 DIAGNOSIS — T8781 Dehiscence of amputation stump: Secondary | ICD-10-CM | POA: Diagnosis not present

## 2023-09-07 MED ORDER — IPRATROPIUM-ALBUTEROL 0.5-2.5 (3) MG/3ML IN SOLN
3.0000 mL | Freq: Two times a day (BID) | RESPIRATORY_TRACT | Status: DC
  Administered 2023-09-08 (×3): 3 mL via RESPIRATORY_TRACT
  Filled 2023-09-07 (×3): qty 3

## 2023-09-07 NOTE — TOC Initial Note (Addendum)
 Transition of Care [facility]) - Initial/Assessment Note    Patient Details  Name: [name] MRN: [id number] Date of Birth: 1955/05/14  [facility]) CM/SW Contact:    Ayiana Winslett A [place], LCSW Phone Number: 09/07/2023, 2:56 PM  Clinical Narrative:                  CSW spoke with pt's daughter to assist with pt's disposition plan, agreeable for pt to go to SNF. She stated that she was pt's HCPOA, paperwork in pt's chart. CSW provided Medicare.gov ratings and she chose [facility].   CSW reached out to facility, waiting to hear back regarding bed availability.  CSW then met with pt at bedside. He stated that he was from motel with daughter, Stephania, but cannot go back there. CSW informed him that CSW and pt's daughter Dorien had spoken about pt's disposition plan for SNF. Pt was agreeable, stated that his daughter is the one that helps him with making those decisions. CSW also  provided Medicare.gov ratings for bed offers and he said whatever his daughter chose he was ok with.   Requested CSW reach out to pt's aid, Romero, [phone] and provide update as well. CSW to reach out when time allows.  CSW to complete insurance authorization, pt is medically stable for DC.    TOC will continue to follow.  Expected Discharge Plan: Skilled Nursing Facility Barriers to Discharge: Insurance Authorization   Patient Goals and CMS Choice            Expected Discharge Plan and Services In-house Referral: Clinical Social Work     Living arrangements for the past 2 months: Hotel/Motel                                      Prior Living Arrangements/Services Living arrangements for the past 2 months: Hotel/Motel Lives with:: Adult Children              Current home services: Homehealth aide    Activities of Daily Living   ADL Screening (condition at time of admission) Independently performs ADLs?: No Does the patient have a NEW difficulty with  bathing/dressing/toileting/self-feeding that is expected to last >3 days?: No Does the patient have a NEW difficulty with getting in/out of bed, walking, or climbing stairs that is expected to last >3 days?: No Does the patient have a NEW difficulty with communication that is expected to last >3 days?: No Is the patient deaf or have difficulty hearing?: No Does the patient have difficulty seeing, even when wearing glasses/contacts?: No Does the patient have difficulty concentrating, remembering, or making decisions?: No  Permission Sought/Granted                  Emotional Assessment Appearance:: Appears older than stated age Attitude/Demeanor/Rapport: Engaged Affect (typically observed): Pleasant Orientation: : Oriented to Self, Oriented to Place, Oriented to Situation Alcohol / Substance Use: Not Applicable Psych Involvement: No (comment)  Admission diagnosis:  Cellulitis [L03.90] COPD exacerbation (HCC) [J44.1] Wound infection after surgery [T81.49XA] Patient Active Problem List   Diagnosis Date Noted   Cellulitis 09/05/2023   AKA stump complication (HCC) 09/05/2023   Skin ulcer of left thigh, limited to breakdown of skin (HCC) 04/01/2023   Acute hypoxic on chronic hypercapnic respiratory failure (HCC) 03/30/2023   Hyponatremia 08/01/2022   COPD exacerbation (HCC) 07/26/2022   Chronic respiratory failure with hypoxia, on  home O2 therapy (HCC) - 2 L/min 07/26/2022   Above knee amputation of left lower extremity (HCC) 08/18/2020   Porokeratosis 06/10/2020   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 09/28/2019   Acute exacerbation of COPD with asthma (HCC) 07/14/2019   Nicotine  dependence, cigarettes, uncomplicated 07/14/2019   Knee osteoarthritis 10/12/2015   Surgical wound dehiscence 07/30/2015   Dehiscence of amputation stump of left lower extremity (HCC) 07/29/2015   Tobacco abuse 07/29/2015   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 [place] [address] Phone: [phone] Fax: [phone]  [facility] - [place], [place] - [address] [place] [address] Phone: [phone] Fax: [phone]  [facility] - [place], [place] - [place] [address] [place] [address] Phone: [phone] Fax: [phone]  [id number] [place], [place] - [address] AT [facility] [address] [place] [address] Phone: [phone] Fax: [phone]  [facility] [address] [place] [address] Phone: [phone] Fax: [phone]     Social Drivers of Health (SDOH) Social History: SDOH Screenings   Food Insecurity: No Food Insecurity (09/05/2023)  Housing: Low Risk  (09/05/2023)  Transportation Needs: No Transportation Needs (09/05/2023)  Utilities: Not At Risk (09/05/2023)  Depression (PHQ2-9): Low Risk  (07/22/2019)  Social Connections: Socially Isolated (09/05/2023)  Tobacco Use: High Risk (09/05/2023)   SDOH Interventions:     Readmission Risk Interventions    09/07/2023    2:55 PM 09/06/2023    9:14 AM 08/02/2022   12:27 PM  Readmission Risk Prevention Plan  Post Dischage Appt   Complete  Medication Screening   Complete  Transportation Screening Complete Complete Complete  PCP or Specialist Appt within 5-7 Days Complete Complete   Home Care Screening Complete  Complete   Medication Review (RN CM) Complete Complete

## 2023-09-07 NOTE — Progress Notes (Signed)
 PIV not needed per RN.  No IV meds ordered and PIV not needed for CT chest without contrast per CT.
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
## 2023-09-07 NOTE — Progress Notes (Signed)
 Initial Nutrition Assessment  DOCUMENTATION CODES:   Underweight  INTERVENTION:  -Continue regular menu, regular texture, thin liquids -Add Ensure Plus High Protein TID -Add Juven BID -Add MVI w/min -Discussed importance of adequate kcal/pro for healing  NUTRITION DIAGNOSIS:   Increased nutrient needs related to wound healing, acute illness as evidenced by estimated needs.  GOAL:   Patient will meet greater than or equal to 90% of their needs   MONITOR:   PO intake, Weight trends, Supplement acceptance, Labs, Skin  REASON FOR ASSESSMENT:   Diagnosis    ASSESSMENT:   Hx polysubstance abuse, noncompliance, HTN , HLD  Peripheral arterial disease with left AKA/09/2020, COPD on baseline 3 L oxygen . Presented from extended stay motel with hypoxia SpO2 87% he also has been having increasing drainage on his left AKA stump, dtr can no longer house/take care of him.  Spoke to pt at bedside. Pt denies n/v/c/d or chewing/swallowing difficulties. Last BM 7/23. Pt with no documented PO intake, states he is eating fair.  Wt hx has been stable x 6 months, however has varied greatly x 1 yr from 90.7 kg to 45 kg currently. AKA from 2022 and was present during this timeframe. Unsure of UBW, he currently is underweight with a BMI of 16. Discussed importance of adequate kcal/pro intake for healing, discussed supplementation to help increase that intake. Pt agreeable to EPHP TID, Juven BID. Differed NFPE to f/u as pt was using bedpan at time of visit. Pt was living with dtr in extended stay motel and cannot go back per documentation. Suspect social/environmental circumstances decreasing PO intake. Per MD, SNF placement is d/c plan. Pt denies additional questions/concerns at this time, will continue to monitor, RDN available prn.   Labs BG 91-217 Cr 0.40 Albumin 3.0 AST 14 H/H 11.5/36.8  Medications  budesonide   0.5 mg Nebulization BID   doxycycline   100 mg Oral Q12H   famotidine   20 mg Oral  Daily   feeding supplement  237 mL Oral TID BM   heparin   5,000 Units Subcutaneous Q8H   multivitamin with minerals  1 tablet Oral Daily   nutrition supplement (JUVEN)  1 packet Oral BID BM     NUTRITION - FOCUSED PHYSICAL EXAM:  Differed until f/u  Diet Order:   Diet Order             Diet regular Room service appropriate? Yes; Fluid consistency: Thin  Diet effective now                   EDUCATION NEEDS:   Education needs have been addressed  Skin:  Skin Assessment: Skin Integrity Issues: Skin Integrity Issues:: Other (Comment) Other: Open L AKA stump wound  Last BM:  7/23  Height:   Ht Readings from Last 1 Encounters:  09/05/23 5' 6 (1.676 m)    Weight:   Wt Readings from Last 1 Encounters:  09/05/23 45 kg    BMI:  Body mass index is 16.01 kg/m.  Estimated Nutritional Needs:   Kcal:  1575-1800 kcal  Protein:  65-90 g  Fluid:  >/=2L    Riane Rung Daml-Budig, RDN, LDN Registered Dietitian Nutritionist RD Inpatient Contact Info in [place]

## 2023-09-07 NOTE — NC FL2 (Signed)
 [place]  MEDICAID FL2 LEVEL OF CARE FORM     IDENTIFICATION  Patient Name: [name] Birthdate: 20-Dec-1955 Sex: male Admission Date (Current Location): 09/05/2023  [facility] and [place] Number:  Producer, television/film/video and Address:  [facility], [address] [place], [place] [address]      Provider Number: [id number]  Attending Physician Name and Address:  Raenelle Coria, MD  Relative Name and Phone Number:  Kadarrius, Yanke (Daughter)  [phone]    Current Level of Care: Hospital Recommended Level of Care: Skilled Nursing Facility Prior Approval Number:    Date Approved/Denied:   PASRR Number: [id number]  Discharge Plan: SNF    Current Diagnoses: 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 09/28/2019   Acute exacerbation of COPD with asthma (HCC) 07/14/2019   Nicotine  dependence, cigarettes, uncomplicated 07/14/2019   Knee osteoarthritis 10/12/2015   Surgical wound dehiscence 07/30/2015   Dehiscence of amputation stump of left lower extremity (HCC) 07/29/2015   Tobacco abuse 07/29/2015   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   Wound dehiscence 07/19/2015   Rupture of left patellar tendon, open, post-total knee replacement 07/19/2015   Patellar tendon rupture 07/19/2015   Primary osteoarthritis of left knee 06/18/2015   Chronic obstructive airway disease with asthma  (HCC) 06/18/2015   Essential hypertension 06/18/2015   Special screening for malignant neoplasms, colon 12/11/2012    Orientation RESPIRATION BLADDER Height & Weight     Self, Time, Situation, Place  O2 (3L) Continent Weight: 99 lb 3.3 oz (45 kg) Height:  5' 6 (167.6 cm)  BEHAVIORAL SYMPTOMS/MOOD NEUROLOGICAL BOWEL NUTRITION STATUS      Continent Diet (see DC summary)  AMBULATORY STATUS COMMUNICATION OF NEEDS Skin   Extensive Assist Verbally Other (Comment) (Wound / Incision (Open or Dehisced) 03/31/23 Amputation Knee Anterior;Left)                       Personal Care Assistance Level of Assistance  Bathing, Feeding, Dressing Bathing Assistance: Maximum assistance Feeding assistance: Limited assistance Dressing Assistance: Maximum assistance     Functional Limitations Info  Sight, Hearing, Speech Sight Info: Adequate Hearing Info: Adequate Speech Info: Adequate    SPECIAL CARE FACTORS FREQUENCY  PT (By licensed PT), OT (By licensed OT)     PT Frequency: 5x/week OT Frequency: 5x/week            Contractures Contractures Info: Not present    Additional Factors Info  Code Status, Allergies Code Status Info: FULL Allergies Info: No Known Allergies           Current Medications (09/07/2023):  This is the current hospital active medication list Current Facility-Administered Medications  Medication Dose Route Frequency Provider Last Rate Last Admin   albuterol  (PROVENTIL ) (2.5 MG/3ML) 0.083% nebulizer solution 3 mL  3 mL Inhalation Q4H PRN Samtani, Jai-Gurmukh, MD       alum & mag hydroxide-simeth (MAALOX/MYLANTA) 200-200-20 MG/5ML suspension  30 mL  30 mL Oral Q6H PRN Ghimire, Kuber, MD   30 mL at 09/07/23 0759   budesonide  (PULMICORT ) nebulizer solution 0.5 mg  0.5 mg Nebulization BID Samtani, Jai-Gurmukh, MD   0.5 mg at 09/07/23 0825   doxycycline  (VIBRA -TABS) tablet 100 mg  100 mg Oral Q12H Hindman, Katherine G, RPH   100 mg at 09/07/23 0800   famotidine   (PEPCID ) tablet 20 mg  20 mg Oral Daily Ghimire, Kuber, MD   20 mg at 09/07/23 0800   feeding supplement (ENSURE PLUS HIGH PROTEIN) liquid 237 mL  237 mL Oral TID BM Raenelle Coria, MD   237 mL at 09/06/23 2137   heparin  injection 5,000 Units  5,000 Units Subcutaneous Q8H Samtani, Jai-Gurmukh, MD   5,000 Units at 09/07/23 0551   ipratropium-albuterol  (DUONEB) 0.5-2.5 (3) MG/3ML nebulizer solution 3 mL  3 mL Nebulization QID Raenelle Coria, MD   3 mL at 09/07/23 0825   multivitamin with minerals tablet 1 tablet  1 tablet Oral Daily Ghimire, Kuber, MD   1 tablet at 09/07/23 0800   nicotine  (NICODERM CQ  - dosed in mg/24 hr) patch 7 mg  7 mg Transdermal Daily Samtani, Jai-Gurmukh, MD   7 mg at 09/07/23 9197   nutrition supplement (JUVEN) (JUVEN) powder packet 1 packet  1 packet Oral BID BM Ghimire, Kuber, MD   1 packet at 09/07/23 0800     Discharge Medications: Please see discharge summary for a list of discharge medications.  Relevant Imaging Results:  Relevant Lab Results:   Additional Information SSN: [id number]  Hartford Maulden A [place], LCSW
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
## 2023-09-07 NOTE — TOC Progression Note (Signed)
 Transition of Care [facility]) - Progression Note    Patient Details  Name: [name] MRN: [id number] Date of Birth: 1955/07/03  Transition of Care [facility]) CM/SW Contact  Rylea Selway A [place], LCSW Phone Number: 09/07/2023, 4:06 PM  Clinical Narrative:     Shara request started for [facility]. Status pending.   Auth ID: [id number]  CSW waiting to hear back when bed is available at facility.   TOC will continue to follow.   Expected Discharge Plan: Skilled Nursing Facility Barriers to Discharge: Insurance Authorization               Expected Discharge Plan and Services In-house Referral: Clinical Social Work     Living arrangements for the past 2 months: Hotel/Motel                                       Social Drivers of Health (SDOH) Interventions SDOH Screenings   Food Insecurity: No Food Insecurity (09/05/2023)  Housing: Low Risk  (09/05/2023)  Transportation Needs: No Transportation Needs (09/05/2023)  Utilities: Not At Risk (09/05/2023)  Depression (PHQ2-9): Low Risk  (07/22/2019)  Social Connections: Socially Isolated (09/05/2023)  Tobacco Use: High Risk (09/05/2023)    Readmission Risk Interventions    09/07/2023    2:55 PM 09/06/2023    9:14 AM 08/02/2022   12:27 PM  Readmission Risk Prevention Plan  Post Dischage Appt   Complete  Medication Screening   Complete  Transportation Screening Complete Complete Complete  PCP or Specialist Appt within 5-7 Days Complete Complete   Home Care Screening Complete Complete   Medication Review (RN CM) Complete Complete

## 2023-09-07 NOTE — Plan of Care (Signed)

## 2023-09-08 DIAGNOSIS — T8781 Dehiscence of amputation stump: Secondary | ICD-10-CM | POA: Diagnosis not present

## 2023-09-08 DIAGNOSIS — T879 Unspecified complications of amputation stump: Secondary | ICD-10-CM | POA: Diagnosis not present

## 2023-09-08 LAB — EXPECTORATED SPUTUM ASSESSMENT W GRAM STAIN, RFLX TO RESP C

## 2023-09-08 MED ORDER — POLYETHYLENE GLYCOL 3350 17 G PO PACK: 17.0000 g | PACK | Freq: Every day | ORAL | Status: AC

## 2023-09-08 MED ORDER — BUDESONIDE 0.5 MG/2ML IN SUSP: 0.5000 mg | Freq: Two times a day (BID) | RESPIRATORY_TRACT | Status: DC

## 2023-09-08 MED ORDER — DOXYCYCLINE HYCLATE 100 MG PO TABS: 100.0000 mg | ORAL_TABLET | Freq: Two times a day (BID) | ORAL | Status: AC

## 2023-09-08 MED ORDER — NICOTINE 7 MG/24HR TD PT24: 7.0000 mg | MEDICATED_PATCH | Freq: Every day | TRANSDERMAL | Status: AC

## 2023-09-08 MED ORDER — DOCUSATE SODIUM 100 MG PO CAPS: 100.0000 mg | ORAL_CAPSULE | Freq: Two times a day (BID) | ORAL | Status: AC

## 2023-09-08 MED ORDER — BISACODYL 10 MG RE SUPP
10.0000 mg | Freq: Once | RECTAL | Status: AC
  Administered 2023-09-08: 10 mg via RECTAL
  Filled 2023-09-08: qty 1

## 2023-09-08 MED ORDER — IPRATROPIUM-ALBUTEROL 0.5-2.5 (3) MG/3ML IN SOLN: 3.0000 mL | Freq: Two times a day (BID) | RESPIRATORY_TRACT | 1 refills | Status: AC

## 2023-09-08 MED ORDER — DOCUSATE SODIUM 100 MG PO CAPS
100.0000 mg | ORAL_CAPSULE | Freq: Two times a day (BID) | ORAL | Status: DC
  Administered 2023-09-08 (×2): 100 mg via ORAL
  Filled 2023-09-08 (×2): qty 1

## 2023-09-08 MED ORDER — POLYETHYLENE GLYCOL 3350 17 G PO PACK
17.0000 g | PACK | Freq: Every day | ORAL | Status: DC
  Administered 2023-09-08: 17 g via ORAL
  Filled 2023-09-08: qty 1

## 2023-09-08 MED ORDER — ALBUTEROL SULFATE HFA 108 (90 BASE) MCG/ACT IN AERS: 2.0000 | INHALATION_SPRAY | RESPIRATORY_TRACT | Status: AC | PRN

## 2023-09-08 MED ORDER — BUDESONIDE 0.5 MG/2ML IN SUSP
0.5000 mg | Freq: Two times a day (BID) | RESPIRATORY_TRACT | 12 refills | Status: AC
Start: 2023-09-08 — End: ?

## 2023-09-08 MED ORDER — DM-GUAIFENESIN ER 30-600 MG PO TB12: 1.0000 | ORAL_TABLET | Freq: Two times a day (BID) | ORAL | Status: AC

## 2023-09-08 MED ORDER — ADULT MULTIVITAMIN W/MINERALS CH: 1.0000 | ORAL_TABLET | Freq: Every day | ORAL | Status: AC

## 2023-09-08 MED ORDER — DM-GUAIFENESIN ER 30-600 MG PO TB12
1.0000 | ORAL_TABLET | Freq: Two times a day (BID) | ORAL | Status: DC
  Administered 2023-09-08 (×2): 1 via ORAL
  Filled 2023-09-08 (×2): qty 1

## 2023-09-08 MED ORDER — BUDESONIDE 0.5 MG/2ML IN SUSP
0.5000 mg | Freq: Two times a day (BID) | RESPIRATORY_TRACT | Status: DC
  Administered 2023-09-08 (×2): 0.5 mg via RESPIRATORY_TRACT
  Filled 2023-09-08 (×2): qty 2

## 2023-09-08 MED ORDER — DIPHENHYDRAMINE HCL 25 MG PO CAPS: 25.0000 mg | ORAL_CAPSULE | Freq: Once | ORAL | Status: DC | PRN

## 2023-09-08 MED ORDER — ALUM & MAG HYDROXIDE-SIMETH 200-200-20 MG/5ML PO SUSP: 30.0000 mL | Freq: Four times a day (QID) | ORAL | 0 refills | Status: AC | PRN

## 2023-09-08 MED ORDER — FAMOTIDINE 20 MG PO TABS: 20.0000 mg | ORAL_TABLET | Freq: Every day | ORAL | Status: AC

## 2023-09-08 NOTE — Plan of Care (Signed)

## 2023-09-08 NOTE — TOC Progression Note (Signed)
 Transition of Care [facility]) - Progression Note    Patient Details  Name: [name] MRN: [id number] Date of Birth: [birth year]  Transition [facility] Care [facility]) CM/SW Contact  Lenus Trauger A [place], LCSW Phone Number: 09/08/2023, 10:21 AM  Clinical Narrative:     Pt's authorization approved,   Auth ID [id number] Reference ID [id number]  Approval Dates:   09/08/2023-09/12/2023  Bed available at facility today at [facility].   MD notified, EDD today.   TOC will continue to follow.   Expected Discharge Plan: Skilled Nursing Facility Barriers to Discharge: Insurance Authorization               Expected Discharge Plan and Services In-house Referral: Clinical Social Work     Living arrangements for the past 2 months: Hotel/Motel                                       Social Drivers of Health (SDOH) Interventions SDOH Screenings   Food Insecurity: No Food Insecurity (09/05/2023)  Housing: Low Risk  (09/05/2023)  Transportation Needs: No Transportation Needs (09/05/2023)  Utilities: Not At Risk (09/05/2023)  Depression (PHQ2-9): Low Risk  (07/22/2019)  Social Connections: Socially Isolated (09/05/2023)  Tobacco Use: High Risk (09/05/2023)    Readmission Risk Interventions    09/07/2023    2:55 PM 09/06/2023    9:14 AM 08/02/2022   12:27 PM  Readmission Risk Prevention Plan  Post Dischage Appt   Complete  Medication Screening   Complete  Transportation Screening Complete Complete Complete  PCP or Specialist Appt within 5-7 Days Complete Complete   Home Care Screening Complete Complete   Medication Review (RN CM) Complete Complete

## 2023-09-08 NOTE — Care Management Important Message (Signed)
 Important Message  Patient Details  Name: [name] MRN: [id number] Date of Birth: 07-Nov-1955   Important Message Given:  Yes - Medicare IM     Claretta Deed 09/08/2023, 4:12 PM

## 2023-09-08 NOTE — TOC Transition Note (Addendum)
 Transition of Care [facility]) - Discharge Note   Patient Details  Name: [name] MRN: [id number] Date of Birth: 26-Sep-1955  Transition of Care [facility]) CM/SW Contact:  Morenike Cuff A [place], LCSW Phone Number: 09/08/2023, 10:52 AM   Clinical Narrative:     Patient will DC to: [facility]  Anticipated DC date: 09/08/23  Family notified: Bradie Lacock  Transport by: [facility] Barrows ID [id number] Reference ID [id number]   Approval Dates:    09/08/2023-09/12/2023     Per MD patient ready for DC to [facility]. RN, patient, patient's family, and facility notified of DC. Discharge Summary and FL2 sent to facility. RN to call report prior to discharge ( [phone], Room 104p). DC packet on chart. Ambulance transport requested for patient. CSW also reached out to pt aid, [facility] and notified of pt's DC. Address to facility provided. Contacted [facility] at 12:35pm for transport.    CSW will sign off for now as social work intervention is no longer needed. Please consult us  again if new needs arise.   Final next level of care: Skilled Nursing Facility Barriers to Discharge: Barriers Resolved   Patient Goals and CMS Choice            Discharge Placement              Patient chooses bed at: [facility] Patient to be transferred to facility by: [facility] Name of family member notified: Lister Brizzi Patient and family notified of of transfer: 09/08/23  Discharge Plan and Services Additional resources added to the After Visit Summary for   In-house Referral: Clinical Social Work                                   Social Drivers of Health (SDOH) Interventions SDOH Screenings   Food Insecurity: No Food Insecurity (09/05/2023)  Housing: Low Risk  (09/05/2023)  Transportation Needs: No Transportation Needs (09/05/2023)  Utilities: Not At Risk (09/05/2023)  Depression (PHQ2-9): Low Risk  (07/22/2019)  Social Connections: Socially Isolated  (09/05/2023)  Tobacco Use: High Risk (09/05/2023)     Readmission Risk Interventions    09/07/2023    2:55 PM 09/06/2023    9:14 AM 08/02/2022   12:27 PM  Readmission Risk Prevention Plan  Post Dischage Appt   Complete  Medication Screening   Complete  Transportation Screening Complete Complete Complete  PCP or Specialist Appt within 5-7 Days Complete Complete   Home Care Screening Complete Complete   Medication Review (RN CM) Complete Complete

## 2023-09-08 NOTE — Discharge Summary (Signed)
 Physician Discharge Summary  [name] FMW:[id number] DOB: [birth year] DOA: 09/05/2023  PCP: [name], MD  Admit date: 09/05/2023 Discharge date: 09/08/2023  Admitted From: Home Disposition: Skilled nursing facility  Recommendations for Outpatient Follow-up:  Follow up with PCP in 1-2 weeks Please obtain BMP/CBC in one week Outpatient follow-up with orthopedics, they will follow-up. Referral sent to podiatry for right foot care.  Discharge Condition: Stable CODE STATUS: Full code Diet recommendation: Regular diet, nutritional supplements  Discharge summary: 68 year old black male with history of Polysubstance abuse not currently using. Noncompliance with medical therapy historically, HTN , HLD Peripheral arterial disease with left AKA/09/2020 Dr. Harden (infected hardware was removed AKA performed) .COPD on baseline 3 L oxygen   Last hospitalized 2/12 through 04/04/2023 with cellulitis of left AKA site treated with IV antibiotics and was followed by Dr. Harden  7/22 present from extended stay motel with hypoxia SpO2 87% he also has been having increasing drainage on his left AKA stump and apparently his daughter unable to take care of him. In the emergency room, hemodynamically stable.  Adequately saturating on 3 L oxygen .  Given ceftriaxone , Solu-Medrol .  Admitted due to infected AKA wound.  Seen by Dr. Harden.     Assessment & Plan:   Left AKA wound, neglected wound with no obvious evidence of spreading infection: WBC count normal.  No bony involvement.  Seen by orthopedics. Doxycycline  for 7 days.  Local wound care as suggested by orthopedics.Dr. Harden to schedule follow-up.   COPD and chronic hypoxemia on 3 L oxygen  at home. Hemoptysis Patient does not have any current evidence of COPD exacerbation.  Hemoptysis is likely secondary to upper airway irritation.   Sputum culture pending.  Already on doxycycline .   CT scan without contrast done, a lot of debris on his airway.   Incentive spirometer, flutter valve therapy.  Mucinex .   On 3 L oxygen  which is at baseline.  On bronchodilator therapy.     Abnormal ABI right leg: Patient has abnormal blood supply right leg without evidence of ischemia.  He does not have left leg.  Will use this right leg for mobility.   Poorly kept extremities/maceration of the./Ingrowing toenails: Referral made to podiatry for outpatient follow-up.   Social/placement: Medically stabilized.  Requiring to a skilled nursing facility for rehab before going back to motel with daughter.   CODE STATUS: Discussed with patient about CODE STATUS, CPR and different levels of medical scope of treatment.  He chose no CPR but to continue all treatment.    Medically stable to transfer to skilled nursing facility.   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   Increase activity slowly   Complete by: As directed       Allergies as of 09/08/2023   No Known Allergies      Medication List     TAKE these medications    acetaminophen  500 MG tablet Commonly known as: TYLENOL  Take 1,000 mg by mouth 2 (two) times daily as needed for moderate  pain (pain score 4-6), headache or fever.   albuterol  108 (90 Base) MCG/ACT inhaler Commonly known as: VENTOLIN  HFA Inhale 2 puffs into the lungs every 4 (four) hours as needed for wheezing or shortness of breath. What changed: how much to take   alum & mag hydroxide-simeth 200-200-20 MG/5ML suspension Commonly known as: MAALOX/MYLANTA Take 30 mLs by mouth every  6 (six) hours as needed for indigestion or heartburn.   budesonide  0.5 MG/2ML nebulizer solution Commonly known as: Pulmicort  Take 2 mLs (0.5 mg total) by nebulization 2 (two) times daily.   dextromethorphan -guaiFENesin  30-600 MG 12hr tablet Commonly known as: MUCINEX  DM Take 1 tablet by mouth 2 (two) times daily.   docusate sodium  100 MG capsule Commonly known as: COLACE Take 1 capsule (100 mg total) by mouth 2 (two) times daily.   doxycycline  100 MG tablet Commonly known as: VIBRA -TABS Take 1 tablet (100 mg total) by mouth every 12 (twelve) hours for 10 doses.   famotidine  20 MG tablet Commonly known as: PEPCID  Take 1 tablet (20 mg total) by mouth daily. Start taking on: September 09, 2023   ipratropium-albuterol  0.5-2.5 (3) MG/3ML Soln Commonly known as: DUONEB Take 3 mLs by nebulization 2 (two) times daily. What changed: when to take this   multivitamin with minerals Tabs tablet Take 1 tablet by mouth daily. Start taking on: September 09, 2023   nicotine  7 mg/24hr patch Commonly known as: NICODERM CQ  - dosed in mg/24 hr Place 1 patch (7 mg total) onto the skin daily. Start taking on: September 09, 2023   OXYGEN  Inhale 2-3 L/min into the lungs continuous.   polyethylene glycol 17 g packet Commonly known as: MIRALAX  / GLYCOLAX  Take 17 g by mouth daily. Start taking on: September 09, 2023               Discharge Care Instructions  (From admission, onward)           Start     Ordered   09/08/23 0000  Discharge wound care:       Comments: Clean with normal saline and apply Vashe dressing, every day left amputation stump.   09/08/23 1025            Contact information for follow-up providers     Harden [name], MD Follow up in 1 week(s).   Specialty: Orthopedic Surgery Contact information: [address] [place] [address] [phone]              Contact information for after-discharge care     Destination     [facility] .   Service:  Skilled Nursing Contact information: [address] [place]  [address] [phone]                    No Known Allergies  Consultations: Orthopedics   Procedures/Studies: CT CHEST WO CONTRAST Result Date: 09/07/2023 CLINICAL DATA:  Hemoptysis EXAM: CT CHEST WITHOUT CONTRAST TECHNIQUE: Multidetector CT imaging of the chest was performed following the standard protocol without IV contrast. RADIATION DOSE REDUCTION: This exam was performed according to the departmental dose-optimization program which includes automated exposure control, adjustment of the mA and/or kV according to patient size and/or use of iterative reconstruction technique. COMPARISON:  05/30/2021.  Chest x-ray 09/05/2023. FINDINGS: Cardiovascular: Heart is normal size. Aorta is normal caliber. Advanced three-vessel coronary artery disease. Moderate aortic atherosclerosis. Mediastinum/Nodes: No mediastinal, hilar, or axillary adenopathy. Trachea and esophagus are unremarkable. Thyroid  unremarkable. Lungs/Pleura: Advanced centrilobular and paraseptal emphysema. Minimal  dependent atelectasis or scarring in the lung bases, similar to prior study. No acute confluent opacities, effusions or suspicious pulmonary nodules. Mild airway thickening bilaterally, most pronounced in the lower lobes and lingula. Mucous plugging within the left lower lobe and lingular airways. Upper Abdomen: Multiple gallstones within the gallbladder. Musculoskeletal: Chest wall soft tissues are unremarkable. No acute bony abnormality. IMPRESSION: Advanced centrilobular and paraseptal emphysema. Airway thickening with mucous plugging in the left lower lobe and lingula. Three-vessel coronary artery disease. Aortic Atherosclerosis (ICD10-I70.0) and Emphysema (ICD10-J43.9). Electronically Signed   By: Franky Crease M.D.   On: 09/07/2023 15:06   VAS US  ABI WITH/WO TBI Result Date: 09/07/2023  LOWER EXTREMITY DOPPLER STUDY Patient Name:  [name]  Date of Exam:   09/06/2023 Medical Rec #: [id number]          Accession #:    [id number] Date of Birth: 1955-09-19           Patient Gender: M Patient Age:   33 years Exam Location:  [facility] Procedure:      VAS US  ABI WITH/WO TBI Referring Phys: [facility] SAMTANI --------------------------------------------------------------------------------  Indications: Darkening of toes High Risk Factors: Hypertension, current smoker. Other Factors: LT AKA 05/22/2020.  Limitations: Today's exam was limited due to foot turned to side, thickened              toenails. Comparison Study: No previous exams Performing Technologist: Leigh Rom RVT/RDMS  Examination Guidelines: A complete evaluation includes at minimum, Doppler waveform signals and systolic blood pressure reading at the level of bilateral brachial, anterior tibial, and posterior tibial arteries, when vessel segments are accessible. Bilateral testing is considered an integral part of a complete examination. Photoelectric Plethysmograph (PPG) waveforms and toe systolic pressure readings are included as required and additional duplex testing as needed. Limited examinations for reoccurring indications may be performed as noted.  ABI Findings: +---------+------------------+-----+----------+--------+ Right    Rt Pressure (mmHg)IndexWaveform  Comment  +---------+------------------+-----+----------+--------+ Brachial 121                    triphasic          +---------+------------------+-----+----------+--------+ PTA      128               1.06 monophasic         +---------+------------------+-----+----------+--------+ DP       101               0.83 monophasic         +---------+------------------+-----+----------+--------+ Great Toe67                0.55 Abnormal           +---------+------------------+-----+----------+--------+ +---------+------------------+-----+--------+-------+ Left     Lt Pressure  (mmHg)IndexWaveformComment +---------+------------------+-----+--------+-------+ Brachial 116                    absent          +---------+------------------+-----+--------+-------+ PTA                                     AKA     +---------+------------------+-----+--------+-------+ DP                                      AKA     +---------+------------------+-----+--------+-------+ Great Toe  AKA     +---------+------------------+-----+--------+-------+  Summary: Right: The right toe-brachial index is abnormal. Although ankle brachial indices are within normal limits (0.95-1.29), arterial Doppler waveforms at the ankle suggest some component of arterial occlusive disease. *See table(s) above for measurements and observations.  Electronically signed by Debby Robertson on 09/07/2023 at 11:06:50 AM.    Final    DG HIP UNILAT WITH PELVIS 2-3 VIEWS LEFT Result Date: 09/05/2023 CLINICAL DATA:  Stump pain. EXAM: DG HIP (WITH OR WITHOUT PELVIS) 2-3V LEFT COMPARISON:  May 05, 2023. FINDINGS: Status post above knee amputation of left lower extremity. Vascular calcifications are noted. No definite acute lytic destruction or fracture is noted. There does appear to be wound or ulceration involving the distal soft tissues of the stump. IMPRESSION: Probable wound or ulceration involving distal soft tissues of stump. No acute lytic destruction or fracture is noted. Electronically Signed   By: Lynwood Landy Raddle M.D.   On: 09/05/2023 14:22   DG Chest Portable 1 View Result Date: 09/05/2023 CLINICAL DATA:  Shortness of breath EXAM: PORTABLE CHEST 1 VIEW COMPARISON:  Chest radiograph dated 06/13/2023 FINDINGS: Normal lung volumes. Diffuse lucency of bilateral lungs. No pleural effusion or pneumothorax. The heart size and mediastinal contours are within normal limits. Old left clavicle fracture deformity. IMPRESSION: Diffuse lucency of bilateral lungs, which may be seen in  the setting of emphysema. No focal consolidation. Electronically Signed   By: Limin  Xu M.D.   On: 09/05/2023 14:20   (Echo, Carotid, EGD, Colonoscopy, ERCP)    Subjective: Patient seen and examined.  No overnight events.  No more coughing or hemoptysis.  he was happy with the idea to be going to skilled nursing facility.   Discharge Exam: Vitals:   09/08/23 0837 09/08/23 0842  BP: 109/72 (!) 124/59  Pulse: 85 87  Resp: 18 19  Temp: (!) 97.3 F (36.3 C) 98.1 F (36.7 C)  SpO2: 100% 96%   Vitals:   09/08/23 0828 09/08/23 0831 09/08/23 0837 09/08/23 0842  BP:   109/72 (!) 124/59  Pulse:   85 87  Resp:   18 19  Temp:   (!) 97.3 F (36.3 C) 98.1 F (36.7 C)  TempSrc:      SpO2: 98% 98% 100% 96%  Weight:      Height:        General: Alert awake interactive.  Frail looking. Cardiovascular: S1-S2 normal.  Regular rate rhythm. Respiratory: Bilateral clear.  No added sounds.  On 3 L oxygen . Gastrointestinal: Soft.  Nontender.  Bowel sound present. Ext: No swelling or edema.  Left above-knee amputation stump on dressing.  Picture available in the chart. Right leg with multiple bumps nontender.  Distal color is normal.  He has palpable pedal pulses.  Unkempt extremities. Extremely large toenails.  No active ulcers.    The results of significant diagnostics from this hospitalization (including imaging, microbiology, ancillary and laboratory) are listed below for reference.     Microbiology: Recent Results (from the past 240 hours)  Resp panel by RT-PCR (RSV, Flu A&B, Covid) Anterior Nasal Swab     Status: None   Collection Time: 09/05/23  1:42 PM   Specimen: Anterior Nasal Swab  Result Value Ref Range Status   SARS Coronavirus 2 by RT PCR NEGATIVE NEGATIVE Final   Influenza A by PCR NEGATIVE NEGATIVE Final   Influenza B by PCR NEGATIVE NEGATIVE Final    Comment: (NOTE) The Xpert Xpress SARS-CoV-2/FLU/RSV plus assay is intended as an aid  in the diagnosis of influenza from  Nasopharyngeal swab specimens and should not be used as a sole basis for treatment. Nasal washings and aspirates are unacceptable for Xpert Xpress SARS-CoV-2/FLU/RSV testing.  Fact Sheet for Patients: BloggerCourse.com  Fact Sheet for Healthcare Providers: SeriousBroker.it  This test is not yet approved or cleared by the [place]  FDA and has been authorized for detection and/or diagnosis of SARS-CoV-2 by FDA under an Emergency Use Authorization (EUA). This EUA will remain in effect (meaning this test can be used) for the duration of the COVID-19 declaration under Section 564(b)(1) of the Act, 21 U.S.C. section 360bbb-3(b)(1), unless the authorization is terminated or revoked.     Resp Syncytial Virus by PCR NEGATIVE NEGATIVE Final    Comment: (NOTE) Fact Sheet for Patients: BloggerCourse.com  Fact Sheet for Healthcare Providers: SeriousBroker.it  This test is not yet approved or cleared by the [place]  FDA and has been authorized for detection and/or diagnosis of SARS-CoV-2 by FDA under an Emergency Use Authorization (EUA). This EUA will remain in effect (meaning this test can be used) for the duration of the COVID-19 declaration under Section 564(b)(1) of the Act, 21 U.S.C. section 360bbb-3(b)(1), unless the authorization is terminated or revoked.  Performed at [facility], [address], [place], [place] [address]      Labs: BNP (last 3 results) Recent Labs    03/30/23 0150 04/02/23 0537 09/05/23 1344  BNP 66.6 25.6 8.9   Basic Metabolic Panel: Recent Labs  Lab 09/05/23 1509 09/05/23 1714 09/06/23 0208  NA 138 139 137  K 5.5* 4.1 4.3  CL 97* 94* 94*  CO2  --  35* 32  GLUCOSE 91 217* 171*  BUN 16 12 14   CREATININE 0.50* 0.51* 0.40*  CALCIUM   --  9.5 9.1   Liver Function Tests: Recent Labs  Lab 09/05/23 1714 09/06/23 0208  AST 13*  14*  ALT 12 12  ALKPHOS 61 51  BILITOT 0.5 0.6  PROT 6.6 5.8*  ALBUMIN 3.5 3.0*   No results for input(s): LIPASE, AMYLASE in the last 168 hours. No results for input(s): AMMONIA in the last 168 hours. CBC: Recent Labs  Lab 09/05/23 1344 09/05/23 1509 09/06/23 0208  WBC 5.6  --  4.2  NEUTROABS 4.1  --   --   HGB 12.7* 13.6 11.5*  HCT 41.3 40.0 36.8*  MCV 98.1  --  96.6  PLT 181  --  187   Cardiac Enzymes: No results for input(s): CKTOTAL, CKMB, CKMBINDEX, TROPONINI in the last 168 hours. BNP: Invalid input(s): POCBNP CBG: No results for input(s): GLUCAP in the last 168 hours. D-Dimer No results for input(s): DDIMER in the last 72 hours. Hgb A1c No results for input(s): HGBA1C in the last 72 hours. Lipid Profile No results for input(s): CHOL, HDL, LDLCALC, TRIG, CHOLHDL, LDLDIRECT in the last 72 hours. Thyroid  function studies No results for input(s): TSH, T4TOTAL, T3FREE, THYROIDAB in the last 72 hours.  Invalid input(s): FREET3 Anemia work up No results for input(s): VITAMINB12, FOLATE, FERRITIN, TIBC, IRON, RETICCTPCT in the last 72 hours. Urinalysis    Component Value Date/Time   COLORURINE STRAW (A) 05/16/2023 1930   APPEARANCEUR CLEAR 05/16/2023 1930   LABSPEC 1.004 (L) 05/16/2023 1930   PHURINE 6.0 05/16/2023 1930   GLUCOSEU NEGATIVE 05/16/2023 1930   HGBUR NEGATIVE 05/16/2023 1930   BILIRUBINUR NEGATIVE 05/16/2023 1930   KETONESUR NEGATIVE 05/16/2023 1930   PROTEINUR NEGATIVE 05/16/2023 1930   UROBILINOGEN 0.2 03/21/2013 1135  NITRITE NEGATIVE 05/16/2023 1930   LEUKOCYTESUR NEGATIVE 05/16/2023 1930   Sepsis Labs Recent Labs  Lab 09/05/23 1344 09/06/23 0208  WBC 5.6 4.2   Microbiology Recent Results (from the past 240 hours)  Resp panel by RT-PCR (RSV, Flu A&B, Covid) Anterior Nasal Swab     Status: None   Collection Time: 09/05/23  1:42 PM   Specimen: Anterior Nasal Swab  Result Value  Ref Range Status   SARS Coronavirus 2 by RT PCR NEGATIVE NEGATIVE Final   Influenza A by PCR NEGATIVE NEGATIVE Final   Influenza B by PCR NEGATIVE NEGATIVE Final    Comment: (NOTE) The Xpert Xpress SARS-CoV-2/FLU/RSV plus assay is intended as an aid in the diagnosis of influenza from Nasopharyngeal swab specimens and should not be used as a sole basis for treatment. Nasal washings and aspirates are unacceptable for Xpert Xpress SARS-CoV-2/FLU/RSV testing.  Fact Sheet for Patients: BloggerCourse.com  Fact Sheet for Healthcare Providers: SeriousBroker.it  This test is not yet approved or cleared by the [place]  FDA and has been authorized for detection and/or diagnosis of SARS-CoV-2 by FDA under an Emergency Use Authorization (EUA). This EUA will remain in effect (meaning this test can be used) for the duration of the COVID-19 declaration under Section 564(b)(1) of the Act, 21 U.S.C. section 360bbb-3(b)(1), unless the authorization is terminated or revoked.     Resp Syncytial Virus by PCR NEGATIVE NEGATIVE Final    Comment: (NOTE) Fact Sheet for Patients: BloggerCourse.com  Fact Sheet for Healthcare Providers: SeriousBroker.it  This test is not yet approved or cleared by the [place]  FDA and has been authorized for detection and/or diagnosis of SARS-CoV-2 by FDA under an Emergency Use Authorization (EUA). This EUA will remain in effect (meaning this test can be used) for the duration of the COVID-19 declaration under Section 564(b)(1) of the Act, 21 U.S.C. section 360bbb-3(b)(1), unless the authorization is terminated or revoked.  Performed at [facility], [address], [place], [place] [address]      Time coordinating discharge:  40 minutes  SIGNED:   Renato Applebaum, MD  [facility] 09/08/2023, 10:25 AM

## 2023-09-08 NOTE — Progress Notes (Signed)
 PROGRESS NOTE    [name]  FMW:[id number] DOB: [birth year] DOA: 09/05/2023 PCP: [name], MD    Brief Narrative:  68 year old black male with history of Polysubstance abuse not currently using. Noncompliance with medical therapy historically, HTN , HLD Peripheral arterial disease with left AKA/09/2020 Dr. Harden (infected hardware was removed AKA performed) .COPD on baseline 3 L oxygen   Last hospitalized 2/12 through 04/04/2023 with cellulitis of left AKA site treated with IV antibiotics and was followed by Dr. Harden  7/22 present from extended stay motel with hypoxia SpO2 87% he also has been having increasing drainage on his left AKA stump and apparently his daughter unable to take care of him. In the emergency room, hemodynamically stable.  Adequately saturating on 3 L oxygen .  Given ceftriaxone , Solu-Medrol .  Admitted due to infected AKA wound.  Seen by Dr. Harden.  Subjective: Patient seen and examined.  No overnight events.  Cough congestion has improved.  No more sputum or hemoptysis. I explained to him about CT scan findings, start chest physiotherapy.   Assessment & Plan:   Left AKA wound, neglected wound with no obvious evidence of spreading infection: WBC count normal.  No bony involvement.  Seen by orthopedics. Doxycycline  for 7 days.  Local wound care as suggested by orthopedics. Dr. Harden to schedule follow-up.  COPD and chronic hypoxemia on 3 L oxygen  at home. Hemoptysis Patient does not have any current evidence of COPD exacerbation.  Hemoptysis is likely secondary to upper airway irritation.   Sputum culture pending.  Already on doxycycline .   CT scan without contrast done, a lot of debris on his airway.  Incentive spirometer, flutter valve therapy.  Mucinex .   On 3 L oxygen  which is at baseline.  On bronchodilator therapy.    Abnormal ABI right leg: Patient has abnormal blood supply right leg without evidence of ischemia.  He does not have left leg.  Will use  this right leg for mobility.  Poorly kept extremities/maceration of the./Ingrowing toenails: Referral made to podiatry for outpatient follow-up.  Social/placement: Medically stabilized.  Requiring to a skilled nursing facility for rehab before going back to motel with daughter.  CODE STATUS: Discussed with patient about CODE STATUS, CPR and different levels of medical scope of treatment.  He chose no CPR but to continue all treatment.     DVT prophylaxis: heparin  injection 5,000 Units Start: 09/05/23 1545   Code Status: DNR with limited intervention. Family Communication: None Disposition Plan: Status is: Inpatient Remains inpatient appropriate because: Unsafe disposition.  Medically stable.  Waiting for SNF.     Consultants:  Orthopedics, Dr. Harden  Procedures:  None  Antimicrobials:  Cefepime  and vancomycin  7/22-7/23 Doxycycline  7/23---     Objective: Vitals:   09/08/23 0828 09/08/23 0831 09/08/23 0837 09/08/23 0842  BP:   109/72 (!) 124/59  Pulse:   85 87  Resp:   18 19  Temp:   (!) 97.3 F (36.3 C) 98.1 F (36.7 C)  TempSrc:      SpO2: 98% 98% 100% 96%  Weight:      Height:        Intake/Output Summary (Last 24 hours) at 09/08/2023 1000 Last data filed at 09/08/2023 0900 Gross per 24 hour  Intake 973 ml  Output 2350 ml  Net -1377 ml   Filed Weights   09/05/23 1341  Weight: 45 kg    Examination:  General: Alert awake interactive.  Frail looking. Cardiovascular: S1-S2 normal.  Regular rate rhythm. Respiratory: Bilateral  clear.  No added sounds.  On 3 L oxygen . Gastrointestinal: Soft.  Nontender.  Bowel sound present. Ext: No swelling or edema.  Left above-knee amputation stump on dressing.  Picture available in the chart. Right leg with multiple bumps nontender.  Distal color is normal.  He has palpable pedal pulses.  Unkempt extremities. Extremely large toenails.  No active ulcers.     Data Reviewed: I have personally reviewed following labs  and imaging studies  CBC: Recent Labs  Lab 09/05/23 1344 09/05/23 1509 09/06/23 0208  WBC 5.6  --  4.2  NEUTROABS 4.1  --   --   HGB 12.7* 13.6 11.5*  HCT 41.3 40.0 36.8*  MCV 98.1  --  96.6  PLT 181  --  187   Basic Metabolic Panel: Recent Labs  Lab 09/05/23 1509 09/05/23 1714 09/06/23 0208  NA 138 139 137  K 5.5* 4.1 4.3  CL 97* 94* 94*  CO2  --  35* 32  GLUCOSE 91 217* 171*  BUN 16 12 14   CREATININE 0.50* 0.51* 0.40*  CALCIUM   --  9.5 9.1   GFR: Estimated Creatinine Clearance: 56.3 mL/min (A) (by C-G formula based on SCr of 0.4 mg/dL (L)). Liver Function Tests: Recent Labs  Lab 09/05/23 1714 09/06/23 0208  AST 13* 14*  ALT 12 12  ALKPHOS 61 51  BILITOT 0.5 0.6  PROT 6.6 5.8*  ALBUMIN 3.5 3.0*   No results for input(s): LIPASE, AMYLASE in the last 168 hours. No results for input(s): AMMONIA in the last 168 hours. Coagulation Profile: No results for input(s): INR, PROTIME in the last 168 hours. Cardiac Enzymes: No results for input(s): CKTOTAL, CKMB, CKMBINDEX, TROPONINI in the last 168 hours. BNP (last 3 results) No results for input(s): PROBNP in the last 8760 hours. HbA1C: No results for input(s): HGBA1C in the last 72 hours. CBG: No results for input(s): GLUCAP in the last 168 hours. Lipid Profile: No results for input(s): CHOL, HDL, LDLCALC, TRIG, CHOLHDL, LDLDIRECT in the last 72 hours. Thyroid  Function Tests: No results for input(s): TSH, T4TOTAL, FREET4, T3FREE, THYROIDAB in the last 72 hours. Anemia Panel: No results for input(s): VITAMINB12, FOLATE, FERRITIN, TIBC, IRON, RETICCTPCT in the last 72 hours. Sepsis Labs: No results for input(s): PROCALCITON, LATICACIDVEN in the last 168 hours.  Recent Results (from the past 240 hours)  Resp panel by RT-PCR (RSV, Flu A&B, Covid) Anterior Nasal Swab     Status: None   Collection Time: 09/05/23  1:42 PM   Specimen: Anterior Nasal Swab   Result Value Ref Range Status   SARS Coronavirus 2 by RT PCR NEGATIVE NEGATIVE Final   Influenza A by PCR NEGATIVE NEGATIVE Final   Influenza B by PCR NEGATIVE NEGATIVE Final    Comment: (NOTE) The Xpert Xpress SARS-CoV-2/FLU/RSV plus assay is intended as an aid in the diagnosis of influenza from Nasopharyngeal swab specimens and should not be used as a sole basis for treatment. Nasal washings and aspirates are unacceptable for Xpert Xpress SARS-CoV-2/FLU/RSV testing.  Fact Sheet for Patients: BloggerCourse.com  Fact Sheet for Healthcare Providers: SeriousBroker.it  This test is not yet approved or cleared by the [place]  FDA and has been authorized for detection and/or diagnosis of SARS-CoV-2 by FDA under an Emergency Use Authorization (EUA). This EUA will remain in effect (meaning this test can be used) for the duration of the COVID-19 declaration under Section 564(b)(1) of the Act, 21 U.S.C. section 360bbb-3(b)(1), unless the authorization is terminated or revoked.  Resp Syncytial Virus by PCR NEGATIVE NEGATIVE Final    Comment: (NOTE) Fact Sheet for Patients: BloggerCourse.com  Fact Sheet for Healthcare Providers: SeriousBroker.it  This test is not yet approved or cleared by the [place]  FDA and has been authorized for detection and/or diagnosis of SARS-CoV-2 by FDA under an Emergency Use Authorization (EUA). This EUA will remain in effect (meaning this test can be used) for the duration of the COVID-19 declaration under Section 564(b)(1) of the Act, 21 U.S.C. section 360bbb-3(b)(1), unless the authorization is terminated or revoked.  Performed at [facility], [address] [place], [place] [address]          Radiology Studies: CT CHEST WO CONTRAST Result Date: 09/07/2023 CLINICAL DATA:  Hemoptysis EXAM: CT CHEST WITHOUT CONTRAST TECHNIQUE:  Multidetector CT imaging of the chest was performed following the standard protocol without IV contrast. RADIATION DOSE REDUCTION: This exam was performed according to the departmental dose-optimization program which includes automated exposure control, adjustment of the mA and/or kV according to patient size and/or use of iterative reconstruction technique. COMPARISON:  05/30/2021.  Chest x-ray 09/05/2023. FINDINGS: Cardiovascular: Heart is normal size. Aorta is normal caliber. Advanced three-vessel coronary artery disease. Moderate aortic atherosclerosis. Mediastinum/Nodes: No mediastinal, hilar, or axillary adenopathy. Trachea and esophagus are unremarkable. Thyroid  unremarkable. Lungs/Pleura: Advanced centrilobular and paraseptal emphysema. Minimal dependent atelectasis or scarring in the lung bases, similar to prior study. No acute confluent opacities, effusions or suspicious pulmonary nodules. Mild airway thickening bilaterally, most pronounced in the lower lobes and lingula. Mucous plugging within the left lower lobe and lingular airways. Upper Abdomen: Multiple gallstones within the gallbladder. Musculoskeletal: Chest wall soft tissues are unremarkable. No acute bony abnormality. IMPRESSION: Advanced centrilobular and paraseptal emphysema. Airway thickening with mucous plugging in the left lower lobe and lingula. Three-vessel coronary artery disease. Aortic Atherosclerosis (ICD10-I70.0) and Emphysema (ICD10-J43.9). Electronically Signed   By: Franky Crease M.D.   On: 09/07/2023 15:06   VAS US  ABI WITH/WO TBI Result Date: 09/07/2023  LOWER EXTREMITY DOPPLER STUDY Patient Name:  [name]  Date of Exam:   09/06/2023 Medical Rec #: [id number]          Accession #:    [id number] Date of Birth: 12-10-1955           Patient Gender: M Patient Age:   72 years Exam Location:  [facility] Procedure:      VAS US  ABI WITH/WO TBI Referring Phys: [facility] SAMTANI  --------------------------------------------------------------------------------  Indications: Darkening of toes High Risk Factors: Hypertension, current smoker. Other Factors: LT AKA 05/22/2020.  Limitations: Today's exam was limited due to foot turned to side, thickened              toenails. Comparison Study: No previous exams Performing Technologist: Leigh Rom RVT/RDMS  Examination Guidelines: A complete evaluation includes at minimum, Doppler waveform signals and systolic blood pressure reading at the level of bilateral brachial, anterior tibial, and posterior tibial arteries, when vessel segments are accessible. Bilateral testing is considered an integral part of a complete examination. Photoelectric Plethysmograph (PPG) waveforms and toe systolic pressure readings are included as required and additional duplex testing as needed. Limited examinations for reoccurring indications may be performed as noted.  ABI Findings: +---------+------------------+-----+----------+--------+ Right    Rt Pressure (mmHg)IndexWaveform  Comment  +---------+------------------+-----+----------+--------+ Brachial 121                    triphasic          +---------+------------------+-----+----------+--------+  PTA      128               1.06 monophasic         +---------+------------------+-----+----------+--------+ DP       101               0.83 monophasic         +---------+------------------+-----+----------+--------+ Great Toe67                0.55 Abnormal           +---------+------------------+-----+----------+--------+ +---------+------------------+-----+--------+-------+ Left     Lt Pressure (mmHg)IndexWaveformComment +---------+------------------+-----+--------+-------+ Brachial 116                    absent          +---------+------------------+-----+--------+-------+ PTA                                     AKA     +---------+------------------+-----+--------+-------+ DP                                       AKA     +---------+------------------+-----+--------+-------+ Great Toe                               AKA     +---------+------------------+-----+--------+-------+  Summary: Right: The right toe-brachial index is abnormal. Although ankle brachial indices are within normal limits (0.95-1.29), arterial Doppler waveforms at the ankle suggest some component of arterial occlusive disease. *See table(s) above for measurements and observations.  Electronically signed by Debby Robertson on 09/07/2023 at 11:06:50 AM.    Final         Scheduled Meds:  budesonide   0.5 mg Nebulization BID   docusate sodium   100 mg Oral BID   doxycycline   100 mg Oral Q12H   famotidine   20 mg Oral Daily   feeding supplement  237 mL Oral TID BM   heparin   5,000 Units Subcutaneous Q8H   ipratropium-albuterol   3 mL Nebulization BID   multivitamin with minerals  1 tablet Oral Daily   nicotine   7 mg Transdermal Daily   nutrition supplement (JUVEN)  1 packet Oral BID BM   polyethylene glycol  17 g Oral Daily   Continuous Infusions:     LOS: 3 days    Time spent: 45 minutes    Renato Applebaum, MD [facility]

## 2023-09-09 NOTE — Progress Notes (Signed)
 PT[facility] arrived on unit for patient.

## 2023-09-20 ENCOUNTER — Ambulatory Visit (INDEPENDENT_AMBULATORY_CARE_PROVIDER_SITE_OTHER): Admitting: Family

## 2023-09-20 ENCOUNTER — Encounter: Payer: Self-pay | Admitting: Family

## 2023-09-20 DIAGNOSIS — T879 Unspecified complications of amputation stump: Secondary | ICD-10-CM

## 2023-09-20 DIAGNOSIS — M86252 Subacute osteomyelitis, left femur: Secondary | ICD-10-CM | POA: Diagnosis not present

## 2023-09-20 MED ORDER — DOXYCYCLINE HYCLATE 100 MG PO TABS
100.0000 mg | ORAL_TABLET | Freq: Two times a day (BID) | ORAL | 0 refills | Status: DC
Start: 2023-09-20 — End: 2023-10-31
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
## 2023-10-10 ENCOUNTER — Ambulatory Visit: Admitting: Infectious Diseases

## 2023-10-10 ENCOUNTER — Telehealth: Payer: Self-pay

## 2023-10-10 NOTE — Telephone Encounter (Signed)
 Patient was discharged from [facility] healthcare on 8/22 to [facility] rehab. Called [facility] and spoke with Tameka at transportation to reschedule patient appointment.
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
## 2023-10-18 ENCOUNTER — Encounter: Payer: Self-pay | Admitting: Family

## 2023-10-18 ENCOUNTER — Ambulatory Visit (INDEPENDENT_AMBULATORY_CARE_PROVIDER_SITE_OTHER): Admitting: Family

## 2023-10-18 DIAGNOSIS — T8131XD Disruption of external operation (surgical) wound, not elsewhere classified, subsequent encounter: Secondary | ICD-10-CM | POA: Diagnosis not present

## 2023-10-18 DIAGNOSIS — T879 Unspecified complications of amputation stump: Secondary | ICD-10-CM | POA: Diagnosis not present
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
## 2023-10-19 NOTE — Progress Notes (Signed)
 Attempted to call facility again to confirm receipt of instructions and go over patient's history.  Unable to reach anyone at the facility.  Dr. Harden was notified.

## 2023-10-19 NOTE — Progress Notes (Signed)
 Patient resides at [facility] [phone]).  Unable to reach a nurse at the facility - left message for a return call to review medical history and instructions.  Instructions faxed to Venice Harari at [facility].

## 2023-10-19 NOTE — Progress Notes (Signed)
 Surgical Instructions   Your procedure is scheduled on Friday, September 5th, 2025. Report to [facility] Main Entrance A at 5:45 A.M., then check in with the Admitting office. Any questions or running late day of surgery: call [phone]  Questions prior to your surgery date: call [phone], Monday-Friday, 8am-4pm. If you experience any cold or flu symptoms such as cough, fever, chills, shortness of breath, etc. between now and your scheduled surgery, please notify us  at the above number.     Remember:  Do not eat or drink after midnight the night before your surgery    Take these medicines the morning of surgery with A SIP OF WATER : Budesonide  (Pulmicort ) Nebulizer Doxycycline  (Vibra -tabs) Famotidine  (Pepcid ) Ipratropium-albuterol  (Duoneb)   May take these medicines IF NEEDED: Acetaminophen  (Tylenol ) Albuterol  (Ventolin  HFA) inhaler - bring with you on day of surgery    One week prior to surgery, STOP taking any Aspirin  (unless otherwise instructed by your surgeon) Aleve, Naproxen, Ibuprofen , Motrin , Advil , Goody's, BC's, all herbal medications, fish oil, and non-prescription vitamins.                     Do NOT Smoke (Tobacco/Vaping) for 24 hours prior to your procedure.  If you use a CPAP at night, you may bring your mask/headgear for your overnight stay.   You will be asked to remove any contacts, glasses, piercings, hearing aid's, dentures/partials prior to surgery. Please bring cases for these items if needed.    Patients discharged the day of surgery will not be allowed to drive home, and someone needs to stay with them for 24 hours.  SURGICAL WAITING ROOM VISITATION Patients may have no more than 2 support people in the waiting area - these visitors may rotate.   Pre-op nurse will coordinate an appropriate time for 2 ADULT support persons, who may not rotate, to accompany patient in pre-op.  Children under the age of 66 must have an adult with them who is not  the patient and must remain in the main waiting area with an adult.  If the patient needs to stay at the hospital during part of their recovery, the visitor guidelines for inpatient rooms apply.  Please refer to the [facility] website for the visitor guidelines for any additional information.     Additional instructions for the day of surgery: DO NOT APPLY any lotions, deodorants, cologne, or perfumes.   Do not wear jewelry or makeup Do not wear nail polish, gel polish, artificial nails, or any other type of covering on natural nails (fingers and toes) Do not bring valuables to the hospital. [facility] is not responsible for valuables/personal belongings. Put on clean/comfortable clothes.  Please brush your teeth.  Ask your nurse before applying any prescription medications to the skin.

## 2023-10-20 ENCOUNTER — Encounter (HOSPITAL_COMMUNITY)
Admission: RE | Disposition: A | Discharge status: Skilled Nursing Facility | Payer: Self-pay | Source: Home / Self Care | Attending: Orthopedic Surgery

## 2023-10-20 ENCOUNTER — Inpatient Hospital Stay (HOSPITAL_COMMUNITY)

## 2023-10-20 ENCOUNTER — Encounter (HOSPITAL_COMMUNITY): Payer: Self-pay | Admitting: Orthopedic Surgery

## 2023-10-20 ENCOUNTER — Ambulatory Visit (HOSPITAL_COMMUNITY)
Admission: RE | Admit: 2023-10-20 | Discharge: 2023-10-20 | Disposition: A | Discharge status: Skilled Nursing Facility | Attending: Orthopedic Surgery | Admitting: Orthopedic Surgery

## 2023-10-20 ENCOUNTER — Other Ambulatory Visit: Payer: Self-pay

## 2023-10-20 DIAGNOSIS — M869 Osteomyelitis, unspecified: Secondary | ICD-10-CM | POA: Diagnosis present

## 2023-10-20 DIAGNOSIS — Z79899 Other long term (current) drug therapy: Secondary | ICD-10-CM | POA: Diagnosis not present

## 2023-10-20 DIAGNOSIS — T8781 Dehiscence of amputation stump: Secondary | ICD-10-CM | POA: Insufficient documentation

## 2023-10-20 DIAGNOSIS — F17210 Nicotine dependence, cigarettes, uncomplicated: Secondary | ICD-10-CM | POA: Insufficient documentation

## 2023-10-20 DIAGNOSIS — J4489 Other specified chronic obstructive pulmonary disease: Secondary | ICD-10-CM | POA: Diagnosis not present

## 2023-10-20 DIAGNOSIS — Z89612 Acquired absence of left leg above knee: Secondary | ICD-10-CM | POA: Diagnosis not present

## 2023-10-20 DIAGNOSIS — K219 Gastro-esophageal reflux disease without esophagitis: Secondary | ICD-10-CM | POA: Insufficient documentation

## 2023-10-20 DIAGNOSIS — Z5309 Procedure and treatment not carried out because of other contraindication: Secondary | ICD-10-CM | POA: Insufficient documentation

## 2023-10-20 DIAGNOSIS — F17290 Nicotine dependence, other tobacco product, uncomplicated: Secondary | ICD-10-CM | POA: Insufficient documentation

## 2023-10-20 DIAGNOSIS — I10 Essential (primary) hypertension: Secondary | ICD-10-CM | POA: Insufficient documentation

## 2023-10-20 DIAGNOSIS — T879 Unspecified complications of amputation stump: Secondary | ICD-10-CM

## 2023-10-20 DIAGNOSIS — Z9981 Dependence on supplemental oxygen: Secondary | ICD-10-CM | POA: Diagnosis not present

## 2023-10-20 LAB — CBC WITH DIFFERENTIAL/PLATELET
Abs Immature Granulocytes: 0.02 K/uL (ref 0.00–0.07)
Basophils Absolute: 0 K/uL (ref 0.0–0.1)
Basophils Relative: 1 %
Eosinophils Absolute: 0.2 K/uL (ref 0.0–0.5)
Eosinophils Relative: 4 %
HCT: 45.8 % (ref 39.0–52.0)
Hemoglobin: 13.7 g/dL (ref 13.0–17.0)
Immature Granulocytes: 1 %
Lymphocytes Relative: 20 %
Lymphs Abs: 0.9 K/uL (ref 0.7–4.0)
MCH: 30.7 pg (ref 26.0–34.0)
MCHC: 29.9 g/dL — ABNORMAL LOW (ref 30.0–36.0)
MCV: 102.7 fL — ABNORMAL HIGH (ref 80.0–100.0)
Monocytes Absolute: 0.3 K/uL (ref 0.1–1.0)
Monocytes Relative: 8 %
Neutro Abs: 2.9 K/uL (ref 1.7–7.7)
Neutrophils Relative %: 66 %
Platelets: 168 K/uL (ref 150–400)
RBC: 4.46 MIL/uL (ref 4.22–5.81)
RDW: 13.2 % (ref 11.5–15.5)
WBC: 4.3 K/uL (ref 4.0–10.5)
nRBC: 0 % (ref 0.0–0.2)

## 2023-10-20 SURGERY — REVISION, ABOVE THE KNEE AMPUTATION
Anesthesia: Choice | Laterality: Left

## 2023-10-20 MED ORDER — PROPOFOL 10 MG/ML IV BOLUS
INTRAVENOUS | Status: AC
  Filled 2023-10-20: qty 20

## 2023-10-20 MED ORDER — CHLORHEXIDINE GLUCONATE 0.12 % MT SOLN
OROMUCOSAL | Status: AC
  Filled 2023-10-20: qty 15

## 2023-10-20 MED ORDER — ACETAMINOPHEN 500 MG PO TABS
ORAL_TABLET | ORAL | Status: AC
  Filled 2023-10-20: qty 2

## 2023-10-20 MED ORDER — CEFAZOLIN SODIUM-DEXTROSE 2-4 GM/100ML-% IV SOLN: 2.0000 g | INTRAVENOUS | Status: DC

## 2023-10-20 MED ORDER — FENTANYL CITRATE (PF) 250 MCG/5ML IJ SOLN
INTRAMUSCULAR | Status: AC
  Filled 2023-10-20: qty 5

## 2023-10-20 MED ORDER — LACTATED RINGERS IV SOLN: INTRAVENOUS | Status: DC

## 2023-10-20 MED ORDER — CEFAZOLIN SODIUM-DEXTROSE 2-4 GM/100ML-% IV SOLN
INTRAVENOUS | Status: AC
  Filled 2023-10-20: qty 100

## 2023-10-20 MED ORDER — MIDAZOLAM HCL 2 MG/2ML IJ SOLN
INTRAMUSCULAR | Status: AC
  Filled 2023-10-20: qty 2

## 2023-10-20 MED ORDER — ORAL CARE MOUTH RINSE: 15.0000 mL | Freq: Once | OROMUCOSAL | Status: DC

## 2023-10-20 MED ORDER — ACETAMINOPHEN 500 MG PO TABS: 1000.0000 mg | ORAL_TABLET | Freq: Once | ORAL | Status: DC

## 2023-10-20 MED ORDER — CHLORHEXIDINE GLUCONATE 0.12 % MT SOLN: 15.0000 mL | Freq: Once | OROMUCOSAL | Status: DC
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
## 2023-10-20 NOTE — Anesthesia Preprocedure Evaluation (Addendum)
 Anesthesia Evaluation  Patient identified by MRN, date of birth, ID band Patient awake    Reviewed: Allergy & Precautions, NPO status , Patient's Chart, lab work & pertinent test results  History of Anesthesia Complications Negative for: history of anesthetic complications  Airway Mallampati: I  TM Distance: >3 FB Neck ROM: Full    Dental  (+) Edentulous Upper, Partial Lower   Pulmonary shortness of breath, with exertion and Long-Term Oxygen  Therapy, asthma , COPD (oxygen  qhs),  oxygen  dependent, Current Smoker   Pulmonary exam normal breath sounds clear to auscultation       Cardiovascular hypertension, Pt. on medications Normal cardiovascular exam Rhythm:Regular Rate:Normal     Neuro/Psych Seizures - (related to [facility], remote past), Well Controlled,   negative psych ROS   GI/Hepatic ,GERD  Controlled and Medicated,,(+)     substance abuse (1-2 cans of beer per day, crack cocaine last week)  alcohol use and cocaine use  Endo/Other  negative endocrine ROS    Renal/GU negative Renal ROS  negative genitourinary   Musculoskeletal  (+) Arthritis , Osteoarthritis,  LLE chronic osteo   Abdominal   Peds  Hematology negative hematology ROS (+)   Anesthesia Other Findings   Reproductive/Obstetrics negative OB ROS                              Anesthesia Physical Anesthesia Plan  ASA: 3  Anesthesia Plan: General   Post-op Pain Management: Tylenol  PO (pre-op)*   Induction: Intravenous  PONV Risk Score and Plan: 1 and Ondansetron , Dexamethasone  and Treatment may vary due to age or medical condition  Airway Management Planned: LMA  Additional Equipment: None  Intra-op Plan:   Post-operative Plan: Extubation in OR  Informed Consent: I have reviewed the patients History and Physical, chart, labs and discussed the procedure including the risks, benefits and alternatives for the proposed  anesthesia with the patient or authorized representative who has indicated his/her understanding and acceptance.       Plan Discussed with: CRNA  Anesthesia Plan Comments:          Anesthesia Quick Evaluation
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
## 2023-10-20 NOTE — Progress Notes (Addendum)
 Patient stated he had a peanut butter sandwich this morning.  Called [facility] and spoke to his RN who was unaware.  Patient stated he had a bag in his room with food including doughnuts. The patient was very unclear as to exactly what time he had his sandwich. Dr. Harden made aware and has opted to reschedule patient's procedure.  0800  Called patient's daughter, Kainoah Bartosiewicz, and updated her about the procedure being rescheduled.  She did verify patient has a bag of food in his room.    [address] [facility] transport Temeka at [phone].  She will call [phone] when she arrives for us  to bring patient out to her.    0840 Patient wheeled out to [facility] transporter.

## 2023-10-31 ENCOUNTER — Encounter (HOSPITAL_COMMUNITY): Payer: Self-pay | Admitting: Orthopedic Surgery

## 2023-10-31 ENCOUNTER — Ambulatory Visit (INDEPENDENT_AMBULATORY_CARE_PROVIDER_SITE_OTHER): Admitting: Infectious Diseases

## 2023-10-31 ENCOUNTER — Other Ambulatory Visit: Payer: Self-pay

## 2023-10-31 VITALS — BP 121/83 | HR 78 | Temp 98.8°F

## 2023-10-31 DIAGNOSIS — Z79899 Other long term (current) drug therapy: Secondary | ICD-10-CM

## 2023-10-31 DIAGNOSIS — T8130XA Disruption of wound, unspecified, initial encounter: Secondary | ICD-10-CM | POA: Diagnosis not present

## 2023-10-31 DIAGNOSIS — T8789 Other complications of amputation stump: Secondary | ICD-10-CM

## 2023-10-31 DIAGNOSIS — Z89612 Acquired absence of left leg above knee: Secondary | ICD-10-CM | POA: Diagnosis not present

## 2023-10-31 DIAGNOSIS — M8660 Other chronic osteomyelitis, unspecified site: Secondary | ICD-10-CM | POA: Diagnosis not present

## 2023-10-31 NOTE — Progress Notes (Signed)
 Refaxed pre op instructions to [facility] [phone].

## 2023-10-31 NOTE — Progress Notes (Signed)
 Surgical Instructions                 Your procedure is scheduled on Wednesday September 17.             Report to [facility] Main Entrance A at 7:00 A.M., then check in with the Admitting office.             Call this number if you have problems the morning of surgery:             [phone]                 Remember:             DO NOT EAT AFTER MIDNIGHT THE NIGHT BEFORE YOUR SURGERY.   You may drink clear liquids until 6:30am the morning of your surgery.   Clear liquids allowed are: Water , Non-Citrus Juices (without pulp), Carbonated Beverages, Clear Tea, Black Coffee ONLY (NO MILK, CREAM OR POWDERED CREAMER of any kind), and Gatorade               Take these medicines the morning of surgery with A SIP OF WATER :  budesonide  (PULMICORT )  famotidine  (PEPCID )  ipratropium-albuterol  (DUONEB)  albuterol  (VENTOLIN  HFA) 108 (90 Base) MCG/ACT inhaler if needed- you may bring the inhaler to the hospital.   As of today, STOP taking any Aspirin  (unless otherwise instructed by your surgeon) Aleve, Naproxen, Ibuprofen , Motrin , Advil , Goody's, BC's, all herbal medications, fish oil, and all vitamins.   [facility] is not responsible for any belongings or valuables. .    Do NOT Smoke (Tobacco/Vaping)  24 hours prior to your procedure   If you use a CPAP at night, you may bring your mask for your overnight stay.   Contacts, glasses, hearing aids, dentures or partials may not be worn into surgery, please bring cases for these belongings   Patients discharged the day of surgery will not be allowed to drive home, and someone needs to stay with them for 24 hours.     SURGICAL WAITING ROOM VISITATION You may have 1 visitor in the pre-op area at a time determined by the pre-op nurse. (Visitor may not switch out) Patients having surgery or a procedure in a hospital may have two support people in the waiting room. Children under the age of 44 must have an adult with them who is not the  patient. They may stay in the waiting area during the procedure and may switch out with other visitors. If the patient needs to stay at the hospital during part of their recovery, the visitor guidelines for inpatient rooms apply.   Please refer to the [facility] website for the visitor guidelines for Inpatients (after your surgery is over and you are in a regular room).        Special instructions:     Oral Hygiene is also important to reduce your risk of infection.  Remember - BRUSH YOUR TEETH THE MORNING OF SURGERY WITH YOUR REGULAR TOOTHPASTE     Day of Surgery:   Take a shower the day of or night before with antibacterial soap. Wear Clean/Comfortable clothing the morning of surgery Do not apply any deodorants/lotions.   Do not wear jewelry or makeup Do not wear lotions, powders, perfumes/colognes, or deodorant. Do not shave 48 hours prior to surgery.  Men may shave face and neck. Do not bring valuables to the hospital. Do not wear nail polish, gel polish, artificial nails, or any other type  of covering on natural nails (fingers and toes) If you have artificial nails or gel coating that need to be removed by a nail salon, please have this removed prior to surgery. Artificial nails or gel coating may interfere with anesthesia's ability to adequately monitor your vital signs. Remember to brush your teeth WITH YOUR REGULAR TOOTHPASTE.
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
## 2023-10-31 NOTE — Progress Notes (Addendum)
 SDW CALL Pre-op instructions faxed to [facility] Attention Demetria LPN. I spoke with Demetria over the phone and reviewed instructions. I asked for call back to acknowledge receipt of instructions. Demetria stated that all snacks would be removed from the patient's room before midnight tonight to ensure he does not eat anything after midnight. The opportunity was given for Demetria to ask questions. No further questions asked She verbalized understanding of instructions given. I included my phone number for call back on faxed instructions. I also spoke with patient's daughter, Jauan Wohl, who stated she wants to be at hospital for patient's surgery tomorrow. I confirmed patient's arrival time of 0700 and reviewed patient history with Ms. Essex. All questions answered.    PCP - Aliene Shelda COME Cardiologist -   PPM/ICD - denies Device Orders -  Rep Notified -   Chest x-ray -CT chest-09/07/23 EKG - 09/05/23 Stress Test - denies ECHO - denies Cardiac Cath - denies  Sleep Study -  CPAP - no  Fasting Blood Sugar - na Checks Blood Sugar _____ times a day  Blood Thinner Instructions:na Aspirin  Instructions:na  ERAS Protcol -clears until 0630 PRE-SURGERY Ensure or G2- no  COVID TEST- na   Anesthesia review:     Surgical Instructions    Your procedure is scheduled on Wednesday September 17.  Report to [facility] Main Entrance A at 7:00 A.M., then check in with the Admitting office.  Call this number if you have problems the morning of surgery:  [phone]    Remember:  DO NOT EAT AFTER MIDNIGHT THE NIGHT BEFORE YOUR SURGERY.  You may drink clear liquids until 6:30am the morning of your surgery.   Clear liquids allowed are: Water , Non-Citrus Juices (without pulp), Carbonated Beverages, Clear Tea, Black Coffee ONLY (NO MILK, CREAM OR POWDERED CREAMER of any kind), and Gatorade   Take these medicines the morning of surgery  with A SIP OF WATER :  budesonide  (PULMICORT )  famotidine  (PEPCID )  ipratropium-albuterol  (DUONEB)  albuterol  (VENTOLIN  HFA) 108 (90 Base) MCG/ACT inhaler if needed- you may bring the inhaler to the hospital.  As of today, STOP taking any Aspirin  (unless otherwise instructed by your surgeon) Aleve, Naproxen, Ibuprofen , Motrin , Advil , Goody's, BC's, all herbal medications, fish oil, and all vitamins.  [facility] is not responsible for any belongings or valuables. .   Do NOT Smoke (Tobacco/Vaping)  24 hours prior to your procedure  If you use a CPAP at night, you may bring your mask for your overnight stay.   Contacts, glasses, hearing aids, dentures or partials may not be worn into surgery, please bring cases for these belongings   Patients discharged the day of surgery will not be allowed to drive home, and someone needs to stay with them for 24 hours.   SURGICAL WAITING ROOM VISITATION You may have 1 visitor in the pre-op area at a time determined by the pre-op nurse. (Visitor may not switch out) Patients having surgery or a procedure in a hospital may have two support people in the waiting room. Children under the age of 33 must have an adult with them who is not the patient. They may stay in the waiting area during the procedure and may switch out with other visitors. If the patient needs to stay at the hospital during part of their recovery, the visitor guidelines for inpatient rooms apply.  Please refer to the [facility] website for the visitor guidelines for Inpatients (after your surgery is over and you  are in a regular room).     Special instructions:    Oral Hygiene is also important to reduce your risk of infection.  Remember - BRUSH YOUR TEETH THE MORNING OF SURGERY WITH YOUR REGULAR TOOTHPASTE   Day of Surgery:  Take a shower the day of or night before with antibacterial soap. Wear Clean/Comfortable clothing the morning of surgery Do not apply any deodorants/lotions.    Do not wear jewelry or makeup Do not wear lotions, powders, perfumes/colognes, or deodorant. Do not shave 48 hours prior to surgery.  Men may shave face and neck. Do not bring valuables to the hospital. Do not wear nail polish, gel polish, artificial nails, or any other type of covering on natural nails (fingers and toes) If you have artificial nails or gel coating that need to be removed by a nail salon, please have this removed prior to surgery. Artificial nails or gel coating may interfere with anesthesia's ability to adequately monitor your vital signs. Remember to brush your teeth WITH YOUR REGULAR TOOTHPASTE.

## 2023-10-31 NOTE — Anesthesia Preprocedure Evaluation (Signed)
 Anesthesia Evaluation  Patient identified by MRN, date of birth, ID band Patient awake    Reviewed: Allergy & Precautions, H&P , NPO status , Patient's Chart, lab work & pertinent test results  Airway Mallampati: II   Neck ROM: full    Dental   Pulmonary shortness of breath, asthma , COPD, Current Smoker   breath sounds clear to auscultation       Cardiovascular hypertension,  Rhythm:regular Rate:Normal     Neuro/Psych Seizures -,     GI/Hepatic ,GERD  ,,  Endo/Other    Renal/GU      Musculoskeletal  (+) Arthritis ,    Abdominal   Peds  Hematology   Anesthesia Other Findings   Reproductive/Obstetrics                              Anesthesia Physical Anesthesia Plan  ASA: 3  Anesthesia Plan: General   Post-op Pain Management:    Induction: Intravenous  PONV Risk Score and Plan: 1 and Ondansetron , Dexamethasone , Midazolam  and Treatment may vary due to age or medical condition  Airway Management Planned: LMA  Additional Equipment:   Intra-op Plan:   Post-operative Plan: Extubation in OR  Informed Consent: I have reviewed the patients History and Physical, chart, labs and discussed the procedure including the risks, benefits and alternatives for the proposed anesthesia with the patient or authorized representative who has indicated his/her understanding and acceptance.     Dental advisory given  Plan Discussed with: CRNA, Anesthesiologist and Surgeon  Anesthesia Plan Comments: (PAT note written 10/31/2023 by Allison Zelenak, PA-C.  )         Anesthesia Quick Evaluation
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
## 2023-11-01 ENCOUNTER — Inpatient Hospital Stay (HOSPITAL_COMMUNITY): Admitting: Certified Registered"

## 2023-11-01 ENCOUNTER — Other Ambulatory Visit: Payer: Self-pay

## 2023-11-01 ENCOUNTER — Inpatient Hospital Stay (HOSPITAL_COMMUNITY)
Admission: RE | Admit: 2023-11-01 | Discharge: 2023-11-03 | DRG: 464 | Disposition: A | Discharge status: Skilled Nursing Facility | Source: Skilled Nursing Facility | Attending: Orthopedic Surgery | Admitting: Orthopedic Surgery

## 2023-11-01 ENCOUNTER — Encounter (HOSPITAL_COMMUNITY)
Admission: RE | Disposition: A | Discharge status: Skilled Nursing Facility | Payer: Self-pay | Source: Skilled Nursing Facility | Attending: Orthopedic Surgery

## 2023-11-01 ENCOUNTER — Encounter (HOSPITAL_COMMUNITY): Payer: Self-pay | Admitting: Orthopedic Surgery

## 2023-11-01 DIAGNOSIS — F17210 Nicotine dependence, cigarettes, uncomplicated: Secondary | ICD-10-CM

## 2023-11-01 DIAGNOSIS — I10 Essential (primary) hypertension: Secondary | ICD-10-CM

## 2023-11-01 DIAGNOSIS — Z89511 Acquired absence of right leg below knee: Secondary | ICD-10-CM

## 2023-11-01 DIAGNOSIS — Y835 Amputation of limb(s) as the cause of abnormal reaction of the patient, or of later complication, without mention of misadventure at the time of the procedure: Secondary | ICD-10-CM | POA: Diagnosis present

## 2023-11-01 DIAGNOSIS — T8789 Other complications of amputation stump: Secondary | ICD-10-CM | POA: Diagnosis present

## 2023-11-01 DIAGNOSIS — J449 Chronic obstructive pulmonary disease, unspecified: Secondary | ICD-10-CM

## 2023-11-01 DIAGNOSIS — J4489 Other specified chronic obstructive pulmonary disease: Secondary | ICD-10-CM | POA: Diagnosis present

## 2023-11-01 DIAGNOSIS — Z79899 Other long term (current) drug therapy: Secondary | ICD-10-CM

## 2023-11-01 DIAGNOSIS — T8781 Dehiscence of amputation stump: Secondary | ICD-10-CM | POA: Diagnosis present

## 2023-11-01 DIAGNOSIS — Z87820 Personal history of traumatic brain injury: Secondary | ICD-10-CM

## 2023-11-01 DIAGNOSIS — S78112A Complete traumatic amputation at level between left hip and knee, initial encounter: Principal | ICD-10-CM

## 2023-11-01 DIAGNOSIS — M869 Osteomyelitis, unspecified: Secondary | ICD-10-CM

## 2023-11-01 DIAGNOSIS — Z9981 Dependence on supplemental oxygen: Secondary | ICD-10-CM | POA: Diagnosis not present

## 2023-11-01 DIAGNOSIS — Z792 Long term (current) use of antibiotics: Secondary | ICD-10-CM

## 2023-11-01 DIAGNOSIS — F17290 Nicotine dependence, other tobacco product, uncomplicated: Secondary | ICD-10-CM | POA: Diagnosis present

## 2023-11-01 DIAGNOSIS — Z96652 Presence of left artificial knee joint: Secondary | ICD-10-CM | POA: Diagnosis present

## 2023-11-01 DIAGNOSIS — M868X5 Other osteomyelitis, thigh: Secondary | ICD-10-CM | POA: Diagnosis present

## 2023-11-01 LAB — CBC WITH DIFFERENTIAL/PLATELET
Abs Immature Granulocytes: 0.02 K/uL (ref 0.00–0.07)
Basophils Absolute: 0.1 K/uL (ref 0.0–0.1)
Basophils Relative: 1 %
Eosinophils Absolute: 0.2 K/uL (ref 0.0–0.5)
Eosinophils Relative: 4 %
HCT: 42 % (ref 39.0–52.0)
Hemoglobin: 12.6 g/dL — ABNORMAL LOW (ref 13.0–17.0)
Immature Granulocytes: 0 %
Lymphocytes Relative: 19 %
Lymphs Abs: 1.1 K/uL (ref 0.7–4.0)
MCH: 30.7 pg (ref 26.0–34.0)
MCHC: 30 g/dL (ref 30.0–36.0)
MCV: 102.4 fL — ABNORMAL HIGH (ref 80.0–100.0)
Monocytes Absolute: 0.5 K/uL (ref 0.1–1.0)
Monocytes Relative: 9 %
Neutro Abs: 3.8 K/uL (ref 1.7–7.7)
Neutrophils Relative %: 67 %
Platelets: 186 K/uL (ref 150–400)
RBC: 4.1 MIL/uL — ABNORMAL LOW (ref 4.22–5.81)
RDW: 13 % (ref 11.5–15.5)
WBC: 5.7 K/uL (ref 4.0–10.5)
nRBC: 0 % (ref 0.0–0.2)

## 2023-11-01 LAB — COMPREHENSIVE METABOLIC PANEL WITH GFR
ALT: 15 U/L (ref 0–44)
AST: 18 U/L (ref 15–41)
Albumin: 3.8 g/dL (ref 3.5–5.0)
Alkaline Phosphatase: 81 U/L (ref 38–126)
Anion gap: 11 (ref 5–15)
BUN: 8 mg/dL (ref 8–23)
CO2: 39 mmol/L — ABNORMAL HIGH (ref 22–32)
Calcium: 9.7 mg/dL (ref 8.9–10.3)
Chloride: 89 mmol/L — ABNORMAL LOW (ref 98–111)
Creatinine, Ser: 0.47 mg/dL — ABNORMAL LOW (ref 0.61–1.24)
GFR, Estimated: >60 mL/min (ref >60)
Glucose, Bld: 112 mg/dL — ABNORMAL HIGH (ref 70–99)
Potassium: 4.7 mmol/L (ref 3.5–5.1)
Sodium: 139 mmol/L (ref 135–145)
Total Bilirubin: 0.4 mg/dL (ref 0.0–1.2)
Total Protein: 7.4 g/dL (ref 6.5–8.1)

## 2023-11-01 SURGERY — REVISION, ABOVE THE KNEE AMPUTATION
Anesthesia: General | Site: Leg Upper | Laterality: Left

## 2023-11-01 MED ORDER — HYDRALAZINE HCL 20 MG/ML IJ SOLN: 5.0000 mg | INTRAMUSCULAR | Status: DC | PRN

## 2023-11-01 MED ORDER — ENOXAPARIN SODIUM 40 MG/0.4ML IJ SOSY
40.0000 mg | PREFILLED_SYRINGE | INTRAMUSCULAR | Status: DC
Start: 2023-11-02 — End: 2023-11-02

## 2023-11-01 MED ORDER — VASOPRESSIN 20 UNIT/ML IV SOLN
INTRAVENOUS | Status: AC
  Filled 2023-11-01: qty 1

## 2023-11-01 MED ORDER — OXYCODONE HCL 5 MG/5ML PO SOLN: 5.0000 mg | Freq: Once | ORAL | Status: AC | PRN

## 2023-11-01 MED ORDER — DOCUSATE SODIUM 100 MG PO CAPS
100.0000 mg | ORAL_CAPSULE | Freq: Two times a day (BID) | ORAL | Status: DC
  Administered 2023-11-01 – 2023-11-03 (×5): 100 mg via ORAL
  Filled 2023-11-01 (×5): qty 1

## 2023-11-01 MED ORDER — ONDANSETRON HCL 4 MG/2ML IJ SOLN: 4.0000 mg | Freq: Four times a day (QID) | INTRAMUSCULAR | Status: DC | PRN

## 2023-11-01 MED ORDER — ENOXAPARIN SODIUM 30 MG/0.3ML IJ SOSY
30.0000 mg | PREFILLED_SYRINGE | INTRAMUSCULAR | Status: DC
  Administered 2023-11-02 – 2023-11-03 (×2): 30 mg via SUBCUTANEOUS
  Filled 2023-11-01 (×2): qty 0.3

## 2023-11-01 MED ORDER — LABETALOL HCL 5 MG/ML IV SOLN: 10.0000 mg | INTRAVENOUS | Status: DC | PRN

## 2023-11-01 MED ORDER — FENTANYL CITRATE (PF) 100 MCG/2ML IJ SOLN
25.0000 ug | INTRAMUSCULAR | Status: DC | PRN
  Administered 2023-11-01: 25 ug via INTRAVENOUS

## 2023-11-01 MED ORDER — OXYCODONE HCL 5 MG PO TABS
ORAL_TABLET | ORAL | Status: AC
  Filled 2023-11-01: qty 1

## 2023-11-01 MED ORDER — CEFAZOLIN SODIUM-DEXTROSE 2-4 GM/100ML-% IV SOLN
2.0000 g | Freq: Three times a day (TID) | INTRAVENOUS | Status: AC
  Administered 2023-11-01 – 2023-11-02 (×2): 2 g via INTRAVENOUS
  Filled 2023-11-01 (×2): qty 100

## 2023-11-01 MED ORDER — LIDOCAINE 2% (20 MG/ML) 5 ML SYRINGE
INTRAMUSCULAR | Status: DC | PRN
  Administered 2023-11-01: 40 mg via INTRAVENOUS

## 2023-11-01 MED ORDER — EPHEDRINE 5 MG/ML INJ
INTRAVENOUS | Status: AC
  Filled 2023-11-01: qty 5

## 2023-11-01 MED ORDER — JUVEN PO PACK
1.0000 | PACK | Freq: Two times a day (BID) | ORAL | Status: DC
  Administered 2023-11-01 – 2023-11-03 (×4): 1 via ORAL
  Filled 2023-11-01 (×4): qty 1

## 2023-11-01 MED ORDER — GUAIFENESIN ER 600 MG PO TB12
600.0000 mg | ORAL_TABLET | Freq: Two times a day (BID) | ORAL | Status: DC
  Administered 2023-11-01 – 2023-11-03 (×4): 600 mg via ORAL
  Filled 2023-11-01 (×5): qty 1

## 2023-11-01 MED ORDER — ACETAMINOPHEN 500 MG PO TABS: 500.0000 mg | ORAL_TABLET | Freq: Three times a day (TID) | ORAL | Status: DC

## 2023-11-01 MED ORDER — PROPOFOL 10 MG/ML IV BOLUS
INTRAVENOUS | Status: AC
Start: 2023-11-01 — End: 2023-11-01
  Filled 2023-11-01: qty 20

## 2023-11-01 MED ORDER — PROPOFOL 10 MG/ML IV BOLUS
INTRAVENOUS | Status: DC | PRN
Start: 2023-11-01 — End: 2023-11-01
  Administered 2023-11-01: 80 mg via INTRAVENOUS

## 2023-11-01 MED ORDER — FAMOTIDINE 20 MG PO TABS
20.0000 mg | ORAL_TABLET | Freq: Every day | ORAL | Status: DC
  Administered 2023-11-01 – 2023-11-03 (×3): 20 mg via ORAL
  Filled 2023-11-01 (×3): qty 1

## 2023-11-01 MED ORDER — ALBUMIN HUMAN 5 % IV SOLN
INTRAVENOUS | Status: DC | PRN
Start: 2023-11-01 — End: 2023-11-01

## 2023-11-01 MED ORDER — HYDROMORPHONE HCL 1 MG/ML IJ SOLN: 0.5000 mg | INTRAMUSCULAR | Status: DC | PRN

## 2023-11-01 MED ORDER — LACTATED RINGERS IV SOLN
INTRAVENOUS | Status: DC
Start: 2023-11-01 — End: 2023-11-01

## 2023-11-01 MED ORDER — VASOPRESSIN 20 UNIT/ML IV SOLN
INTRAVENOUS | Status: DC | PRN
  Administered 2023-11-01 (×2): 2 [IU] via INTRAVENOUS

## 2023-11-01 MED ORDER — PHENYLEPHRINE 80 MCG/ML (10ML) SYRINGE FOR IV PUSH (FOR BLOOD PRESSURE SUPPORT)
PREFILLED_SYRINGE | INTRAVENOUS | Status: AC
  Filled 2023-11-01: qty 10

## 2023-11-01 MED ORDER — CHLORHEXIDINE GLUCONATE 0.12 % MT SOLN
15.0000 mL | Freq: Once | OROMUCOSAL | Status: DC
Start: 2023-11-01 — End: 2023-11-01
  Filled 2023-11-01: qty 15

## 2023-11-01 MED ORDER — PHENYLEPHRINE HCL-NACL 20-0.9 MG/250ML-% IV SOLN
INTRAVENOUS | Status: DC | PRN
  Administered 2023-11-01: 50 ug/min via INTRAVENOUS

## 2023-11-01 MED ORDER — LACTATED RINGERS IV SOLN: INTRAVENOUS | Status: DC | PRN

## 2023-11-01 MED ORDER — ALBUTEROL SULFATE (2.5 MG/3ML) 0.083% IN NEBU: 2.5000 mg | INHALATION_SOLUTION | RESPIRATORY_TRACT | Status: DC | PRN

## 2023-11-01 MED ORDER — OXYCODONE HCL 5 MG PO TABS
5.0000 mg | ORAL_TABLET | ORAL | Status: DC | PRN
  Administered 2023-11-02 – 2023-11-03 (×4): 5 mg via ORAL
  Filled 2023-11-01 (×4): qty 1

## 2023-11-01 MED ORDER — CEFAZOLIN SODIUM-DEXTROSE 2-4 GM/100ML-% IV SOLN
2.0000 g | INTRAVENOUS | Status: AC
  Administered 2023-11-01: 2 g via INTRAVENOUS
  Filled 2023-11-01: qty 100

## 2023-11-01 MED ORDER — DM-GUAIFENESIN ER 30-600 MG PO TB12
1.0000 | ORAL_TABLET | Freq: Two times a day (BID) | ORAL | Status: DC
  Administered 2023-11-01 – 2023-11-03 (×5): 1 via ORAL
  Filled 2023-11-01 (×6): qty 1

## 2023-11-01 MED ORDER — ACETAMINOPHEN 500 MG PO TABS
1000.0000 mg | ORAL_TABLET | Freq: Four times a day (QID) | ORAL | Status: AC
  Administered 2023-11-01 – 2023-11-02 (×2): 1000 mg via ORAL
  Filled 2023-11-01 (×3): qty 2

## 2023-11-01 MED ORDER — ALUM & MAG HYDROXIDE-SIMETH 200-200-20 MG/5ML PO SUSP: 30.0000 mL | Freq: Four times a day (QID) | ORAL | Status: DC | PRN

## 2023-11-01 MED ORDER — LIDOCAINE 2% (20 MG/ML) 5 ML SYRINGE
INTRAMUSCULAR | Status: AC
Start: 2023-11-01 — End: 2023-11-01
  Filled 2023-11-01: qty 5

## 2023-11-01 MED ORDER — BUDESONIDE 0.5 MG/2ML IN SUSP
0.5000 mg | Freq: Two times a day (BID) | RESPIRATORY_TRACT | Status: DC
  Administered 2023-11-01 – 2023-11-03 (×4): 0.5 mg via RESPIRATORY_TRACT
  Filled 2023-11-01 (×4): qty 2

## 2023-11-01 MED ORDER — METOPROLOL TARTRATE 5 MG/5ML IV SOLN: 2.0000 mg | INTRAVENOUS | Status: DC | PRN

## 2023-11-01 MED ORDER — ADULT MULTIVITAMIN W/MINERALS CH
1.0000 | ORAL_TABLET | Freq: Every day | ORAL | Status: DC
  Administered 2023-11-01 – 2023-11-03 (×3): 1 via ORAL
  Filled 2023-11-01 (×3): qty 1

## 2023-11-01 MED ORDER — VASHE WOUND IRRIGATION OPTIME
TOPICAL | Status: DC | PRN
Start: 2023-11-01 — End: 2023-11-01
  Administered 2023-11-01: 34 [oz_av]

## 2023-11-01 MED ORDER — ONDANSETRON HCL 4 MG/2ML IJ SOLN
INTRAMUSCULAR | Status: AC
  Filled 2023-11-01: qty 2

## 2023-11-01 MED ORDER — IPRATROPIUM-ALBUTEROL 0.5-2.5 (3) MG/3ML IN SOLN
3.0000 mL | Freq: Two times a day (BID) | RESPIRATORY_TRACT | Status: DC
  Administered 2023-11-01 – 2023-11-03 (×4): 3 mL via RESPIRATORY_TRACT
  Filled 2023-11-01 (×4): qty 3

## 2023-11-01 MED ORDER — DEXAMETHASONE SODIUM PHOSPHATE 10 MG/ML IJ SOLN
INTRAMUSCULAR | Status: DC | PRN
  Administered 2023-11-01: 5 mg via INTRAVENOUS

## 2023-11-01 MED ORDER — POLYETHYLENE GLYCOL 3350 17 G PO PACK
17.0000 g | PACK | Freq: Every day | ORAL | Status: DC
  Administered 2023-11-01 – 2023-11-03 (×3): 17 g via ORAL
  Filled 2023-11-01 (×3): qty 1

## 2023-11-01 MED ORDER — ONDANSETRON HCL 4 MG/2ML IJ SOLN
INTRAMUSCULAR | Status: DC | PRN
Start: 2023-11-01 — End: 2023-11-01
  Administered 2023-11-01: 4 mg via INTRAVENOUS

## 2023-11-01 MED ORDER — PHENOL 1.4 % MT LIQD: 1.0000 | OROMUCOSAL | Status: DC | PRN

## 2023-11-01 MED ORDER — OXYCODONE HCL 5 MG PO TABS
10.0000 mg | ORAL_TABLET | ORAL | Status: DC | PRN
  Administered 2023-11-01: 10 mg via ORAL
  Filled 2023-11-01: qty 2

## 2023-11-01 MED ORDER — NICOTINE 7 MG/24HR TD PT24
7.0000 mg | MEDICATED_PATCH | Freq: Every day | TRANSDERMAL | Status: DC
Start: 2023-11-01 — End: 2023-11-03
  Administered 2023-11-01 – 2023-11-03 (×3): 7 mg via TRANSDERMAL
  Filled 2023-11-01 (×3): qty 1

## 2023-11-01 MED ORDER — ORAL CARE MOUTH RINSE: 15.0000 mL | Freq: Once | OROMUCOSAL | Status: DC

## 2023-11-01 MED ORDER — FENTANYL CITRATE (PF) 100 MCG/2ML IJ SOLN
INTRAMUSCULAR | Status: AC
  Filled 2023-11-01: qty 2

## 2023-11-01 MED ORDER — ACETAMINOPHEN 325 MG PO TABS: 325.0000 mg | ORAL_TABLET | Freq: Four times a day (QID) | ORAL | Status: DC | PRN

## 2023-11-01 MED ORDER — PHENYLEPHRINE 80 MCG/ML (10ML) SYRINGE FOR IV PUSH (FOR BLOOD PRESSURE SUPPORT)
PREFILLED_SYRINGE | INTRAVENOUS | Status: DC | PRN
  Administered 2023-11-01: 160 ug via INTRAVENOUS

## 2023-11-01 MED ORDER — SUCCINYLCHOLINE CHLORIDE 200 MG/10ML IV SOSY
PREFILLED_SYRINGE | INTRAVENOUS | Status: DC | PRN
  Administered 2023-11-01: 100 mg via INTRAVENOUS

## 2023-11-01 MED ORDER — FENTANYL CITRATE (PF) 250 MCG/5ML IJ SOLN
INTRAMUSCULAR | Status: AC
  Filled 2023-11-01: qty 5

## 2023-11-01 MED ORDER — OXYCODONE HCL 5 MG PO TABS
5.0000 mg | ORAL_TABLET | Freq: Once | ORAL | Status: AC | PRN
  Administered 2023-11-01: 5 mg via ORAL

## 2023-11-01 MED ORDER — POTASSIUM CHLORIDE CRYS ER 20 MEQ PO TBCR: 40.0000 meq | EXTENDED_RELEASE_TABLET | Freq: Every day | ORAL | Status: DC | PRN

## 2023-11-01 MED ORDER — ROCURONIUM BROMIDE 10 MG/ML (PF) SYRINGE
PREFILLED_SYRINGE | INTRAVENOUS | Status: AC
  Filled 2023-11-01: qty 10

## 2023-11-01 SURGICAL SUPPLY — 34 items
BAG COUNTER SPONGE SURGICOUNT (BAG) IMPLANT
BLADE SAW RECIP 87.9 MT (BLADE) ×1 IMPLANT
BLADE SURG 21 STRL SS (BLADE) ×1 IMPLANT
BNDG COHESIVE 6X5 TAN ST LF (GAUZE/BANDAGES/DRESSINGS) ×1 IMPLANT
CANISTER WOUND CARE 500ML ATS (WOUND CARE) IMPLANT
COVER SURGICAL LIGHT HANDLE (MISCELLANEOUS) ×1 IMPLANT
CUFF TRNQT CYL 34X4.125X (TOURNIQUET CUFF) IMPLANT
DRAPE INCISE IOBAN 66X45 STRL (DRAPES) ×2 IMPLANT
DRAPE U-SHAPE 47X51 STRL (DRAPES) ×1 IMPLANT
DRESSING PREVENA PLUS CUSTOM (GAUZE/BANDAGES/DRESSINGS) ×1 IMPLANT
DRSG VAC PEEL AND PLACE LRG (GAUZE/BANDAGES/DRESSINGS) IMPLANT
DURAPREP 26ML APPLICATOR (WOUND CARE) ×1 IMPLANT
ELECTRODE REM PT RTRN 9FT ADLT (ELECTROSURGICAL) ×1 IMPLANT
GLOVE BIOGEL PI IND STRL 7.5 (GLOVE) ×1 IMPLANT
GLOVE BIOGEL PI IND STRL 9 (GLOVE) ×1 IMPLANT
GLOVE SURG ORTHO 9.0 STRL STRW (GLOVE) ×1 IMPLANT
GLOVE SURG SS PI 6.5 STRL IVOR (GLOVE) ×1 IMPLANT
GOWN STRL REUS W/ TWL LRG LVL3 (GOWN DISPOSABLE) ×1 IMPLANT
GOWN STRL REUS W/ TWL XL LVL3 (GOWN DISPOSABLE) ×2 IMPLANT
GRAFT SKIN WND MICRO 38 (Tissue) IMPLANT
KIT BASIN OR (CUSTOM PROCEDURE TRAY) ×1 IMPLANT
KIT TURNOVER KIT B (KITS) ×1 IMPLANT
MANIFOLD NEPTUNE II (INSTRUMENTS) ×1 IMPLANT
NS IRRIG 1000ML POUR BTL (IV SOLUTION) ×1 IMPLANT
PACK ORTHO EXTREMITY (CUSTOM PROCEDURE TRAY) ×1 IMPLANT
PAD ARMBOARD POSITIONER FOAM (MISCELLANEOUS) ×1 IMPLANT
PREVENA RESTOR ARTHOFORM 46X30 (CANNISTER) ×1 IMPLANT
STAPLER SKIN PROX 35W (STAPLE) IMPLANT
STOCKINETTE IMPERVIOUS LG (DRAPES) IMPLANT
SUT ETHILON 2 0 PSLX (SUTURE) ×2 IMPLANT
SUT SILK 2-0 18XBRD TIE 12 (SUTURE) ×1 IMPLANT
TOWEL GREEN STERILE FF (TOWEL DISPOSABLE) ×1 IMPLANT
TUBE CONNECTING 20X1/4 (TUBING) ×1 IMPLANT
YANKAUER SUCT BULB TIP NO VENT (SUCTIONS) ×1 IMPLANT

## 2023-11-01 NOTE — Anesthesia Procedure Notes (Signed)
 Procedure Name: Intubation Date/Time: 11/01/2023 9:40 AM  Performed by: Delores Dus, CRNAPre-anesthesia Checklist: Patient identified, Emergency Drugs available, Suction available and Patient being monitored Patient Re-evaluated:Patient Re-evaluated prior to induction Oxygen  Delivery Method: Circle system utilized Preoxygenation: Pre-oxygenation with 100% oxygen  Induction Type: IV induction Ventilation: Mask ventilation without difficulty Laryngoscope Size: Miller and 2 Grade View: Grade I Tube type: Oral Tube size: 7.0 mm Number of attempts: 1 Airway Equipment and Method: Stylet and Oral airway Placement Confirmation: ETT inserted through vocal cords under direct vision, positive ETCO2 and breath sounds checked- equal and bilateral Secured at: 22 cm Tube secured with: Tape Dental Injury: Teeth and Oropharynx as per pre-operative assessment

## 2023-11-01 NOTE — Transfer of Care (Signed)
 Immediate Anesthesia Transfer of Care Note  [name]  Procedure(s) Performed: REVISION, ABOVE THE KNEE AMPUTATION, LEFT (Left: Leg Upper)  Patient Location: PACU  Anesthesia Type:MAC  Level of Consciousness: awake  Airway & Oxygen  Therapy: Patient Spontanous Breathing and Patient connected to face mask oxygen   Post-op Assessment: Report given to RN and Post -op Vital signs reviewed and stable  Post vital signs: Reviewed and stable  Last Vitals:  Vitals Value Taken Time  BP 143/131 11/01/23 11:30  Temp 36.5 C 11/01/23 10:23  Pulse 90 11/01/23 11:33  Resp 32 11/01/23 11:33  SpO2 100 % 11/01/23 11:33  Vitals shown include unfiled device data.  Last Pain:  Vitals:   11/01/23 1023  TempSrc:   PainSc: 0-No pain      Patients Stated Pain Goal: 0 (11/01/23 0813)  Complications: No notable events documented.

## 2023-11-01 NOTE — Interval H&P Note (Signed)
 History and Physical Interval Note:  11/01/2023 6:39 AM  [name]  has presented today for surgery, with the diagnosis of Osteomyelitis Left Femur.  The various methods of treatment have been discussed with the patient and family. After consideration of risks, benefits and other options for treatment, the patient has consented to  Procedure(s): REVISION, ABOVE THE KNEE AMPUTATION (Left) as a surgical intervention.  The patient's history has been reviewed, patient examined, no change in status, stable for surgery.  I have reviewed the patient's chart and labs.  Questions were answered to the patient's satisfaction.     [name]

## 2023-11-01 NOTE — Op Note (Signed)
 11/01/2023  10:10 AM  PATIENT:  [name]    PRE-OPERATIVE DIAGNOSIS:  Osteomyelitis Left Femur  POST-OPERATIVE DIAGNOSIS:  Same  PROCEDURE:  REVISION, ABOVE THE KNEE AMPUTATION Application Kerecis micro graft 38 cm. Application of peel in place wound VAC sponge.  SURGEON:  Jerona LULLA Sage, MD  PHYSICIAN ASSISTANT:None ANESTHESIA:   General  PREOPERATIVE INDICATIONS:  [name] is a  68 y.o. male with a diagnosis of Osteomyelitis Left Femur who failed conservative measures and elected for surgical management.    The risks benefits and alternatives were discussed with the patient preoperatively including but not limited to the risks of infection, bleeding, nerve injury, cardiopulmonary complications, the need for revision surgery, among others, and the patient was willing to proceed.  OPERATIVE IMPLANTS:   * No implants in log *  @ENCIMAGES @  OPERATIVE FINDINGS: No deep abscess.  Tissue margins were clear.  OPERATIVE PROCEDURE: Patient was brought the operating room and underwent a general anesthetic.  After adequate levels anesthesia obtained patient's left lower extremity was prepped using DuraPrep draped into a sterile field a timeout was called.  [place] incision was made around the area of wound dehiscence.  The distal 3 cm of femur were resected with a reciprocating saw.  The ulcerative tissue and infected bone were resected in 1 block of tissue through healthy viable tissue.  Electrocautery was used for hemostasis.  The wound was irrigated with Vashe.  The soft tissue was reinforced with Kerecis micro graft 38 cm to cover wound surface area greater than 50 cm.  The deep fascial layers was closed using #1 Vicryl skin was closed using staples the cleanse choice wound VAC sponge was applied this had a good suction fit patient was extubated taken the PACU in stable condition.   DISCHARGE PLANNING:  Antibiotic duration: 24 hours antibiotics  Weightbearing:  Nonweightbearing on the left  Pain medication: Opioid pathway  Dressing care/ Wound VAC: Wound VAC  Ambulatory devices: Transfer training  Discharge to: Back to skilled nursing.  Follow-up: In the office 1 week post operative.

## 2023-11-01 NOTE — Progress Notes (Signed)
   Inpatient Rehabilitation Admissions Coordinator   Rehab consult received. Noted patient went to [facility] 09/08/23 and then to [facility] 10/06/23. We will not pursue [facility] admit at this time. Other rehab venues should be pursued. We will sign off.  Heron Leavell, RN, MSN Rehab Admissions Coordinator [phone] 11/01/2023 4:24 PM

## 2023-11-02 LAB — CBC
HCT: 36.4 % — ABNORMAL LOW (ref 39.0–52.0)
Hemoglobin: 11.3 g/dL — ABNORMAL LOW (ref 13.0–17.0)
MCH: 31.8 pg (ref 26.0–34.0)
MCHC: 31 g/dL (ref 30.0–36.0)
MCV: 102.5 fL — ABNORMAL HIGH (ref 80.0–100.0)
Platelets: 185 K/uL (ref 150–400)
RBC: 3.55 MIL/uL — ABNORMAL LOW (ref 4.22–5.81)
RDW: 12.5 % (ref 11.5–15.5)
WBC: 17.1 K/uL — ABNORMAL HIGH (ref 4.0–10.5)
nRBC: 0 % (ref 0.0–0.2)

## 2023-11-02 LAB — BASIC METABOLIC PANEL WITH GFR
Anion gap: 16 — ABNORMAL HIGH (ref 5–15)
BUN: 13 mg/dL (ref 8–23)
CO2: 37 mmol/L — ABNORMAL HIGH (ref 22–32)
Calcium: 9.7 mg/dL (ref 8.9–10.3)
Chloride: 86 mmol/L — ABNORMAL LOW (ref 98–111)
Creatinine, Ser: 0.4 mg/dL — ABNORMAL LOW (ref 0.61–1.24)
GFR, Estimated: >60 mL/min (ref >60)
Glucose, Bld: 143 mg/dL — ABNORMAL HIGH (ref 70–99)
Potassium: 4.3 mmol/L (ref 3.5–5.1)
Sodium: 139 mmol/L (ref 135–145)

## 2023-11-02 MED ORDER — HYDROCODONE-ACETAMINOPHEN 5-325 MG PO TABS: 1.0000 | ORAL_TABLET | ORAL | 0 refills | Status: AC | PRN

## 2023-11-02 NOTE — Evaluation (Signed)
 Physical Therapy Evaluation Patient Details Name: [name] MRN: [id number] DOB: 04/14/1955 Today's Date: 11/02/2023  History of Present Illness  [name] is a 68 y.o. male who presented to [facility] hospital 11/01/23 with a diagnosis of left femur osteomyelitis who failed conservative measures and elected for surgical management. Pt s/p L AKA revision. 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   Clinical Impression  Pt admitted with above diagnosis. PTA, pt required assist with functional mobility, ADLs, and IADLs. He reports lateral transfer to his manual w/c occasionally using sliding board. Pt has been residing at a SNF since July. Pt currently with functional limitations due to the deficits listed below (see PT Problem List). He required minA x2 for bed mobility and modA x2 for lateral scoot transfer into recliner chair using sliding board. Pt limited by impaired cognition, generalized weakness, and decreased activity tolerance. Pt will benefit from acute skilled PT to increase his independence and safety with mobility to allow discharge. Recommend continued inpatient follow up therapy, <3 hours/day.    If plan is discharge home, recommend the following: Two people to help with walking and/or transfers;A lot of help with bathing/dressing/bathroom;Assistance with cooking/housework;Assist for transportation;Help with stairs or ramp for entrance   Can travel by private vehicle   No    Equipment Recommendations None recommended by PT  Recommendations for Other Services       Functional Status Assessment Patient has had a recent decline in their functional status and demonstrates the ability to make significant improvements in function in a reasonable and predictable amount of time.     Precautions / Restrictions Precautions Precautions: Fall Recall of Precautions/Restrictions: Impaired Precaution/Restrictions Comments: Wound Vac  LLE Restrictions Weight Bearing Restrictions Per Provider Order: Yes LLE Weight Bearing Per Provider Order: Non weight bearing      Mobility  Bed Mobility Overal bed mobility: Needs Assistance Bed Mobility: Supine to Sit     Supine to sit: Min assist, Mod assist, +2 for physical assistance     General bed mobility comments: Pt sat up on R side of bed with increased time. He brought RLE off EOB. Assist to manage LLE. Cues for sequencing and use of bed rail. Assist to elevate trunk and pivot hips fwd til foot on ground using bed pad.    Transfers Overall transfer level: Needs assistance Equipment used: Sliding board Transfers: Bed to chair/wheelchair/BSC            Lateral/Scoot Transfers: Mod assist, +2 physical assistance General transfer comment: Pt transferred to recliner chair using sliding board. Pt leaned to left for PT to place sliding board under right buttock. Educated pt on head-hips relationship. Cued seqeuencing. He pushed through BUE support. Assist to scoot along board. Pt leaned to right for sliding board to be removed.    Ambulation/Gait               General Gait Details: NT - nonambulatory at baseline, uses manual w/c  Stairs            Wheelchair Mobility     Tilt Bed    Modified Rankin (Stroke Patients Only)       Balance Overall balance assessment: Needs assistance Sitting-balance support: Bilateral upper extremity supported, Feet supported Sitting balance-Leahy Scale: Fair Sitting balance - Comments: Pt sat EOB with CGA statically and required +2 assist dynamically.  Pertinent Vitals/Pain Pain Assessment Pain Assessment: Faces Faces Pain Scale: Hurts whole lot Pain Location: LLE Pain Descriptors / Indicators: Discomfort, Moaning, Operative site guarding, Grimacing, Guarding, Aching Pain Intervention(s): Monitored during session, Limited activity within patient's tolerance,  Repositioned    Home Living Family/patient expects to be discharged to:: Skilled nursing facility                   Additional Comments: Pt was at [facility] in July then moved to  [facility] in Aug.    Prior Function Prior Level of Function : Needs assist;Patient poor historian/Family not available       Physical Assist : Mobility (physical);ADLs (physical) Mobility (physical): Transfers ADLs (physical): Bathing;Dressing;IADLs Mobility Comments: Pt reports staff assist with OOB mobility. He is able to complete bed mobility without physical assist. He transfers via lateral scoot and reports occasionally using sliding board. Pt is able to propel himself in w/c. Denies fall history. ADLs Comments: Pt reports staff assist with for LB bathing, dressing, and all IADLs at SNF. He states he compeltes other ADLs with increased time.     Extremity/Trunk Assessment   Upper Extremity Assessment Upper Extremity Assessment: Defer to OT evaluation    Lower Extremity Assessment Lower Extremity Assessment: LLE deficits/detail;Generalized weakness LLE Deficits / Details: Pt POD 1 s/p AKA revision. Limited hip AROM and decreased strength, grossly 2/5. LLE Coordination: decreased gross motor    Cervical / Trunk Assessment Cervical / Trunk Assessment: Kyphotic  Communication   Communication Communication: Impaired Factors Affecting Communication: Difficulty expressing self;Reduced clarity of speech    Cognition Arousal: Alert Behavior During Therapy: Flat affect   PT - Cognitive impairments: No family/caregiver present to determine baseline, Orientation   Orientation impairments: Place, Time, Situation (Pt unsure where he is, unable to select hospital from list of options. Pt  guessed March 2025. He is unsure why he is currently in the hospital.)                   PT - Cognition Comments: Pt A,Ox1 (self only). Re-oriented pt. He was confused and easily  distracted. Cues to focus on task. Following commands: Impaired Following commands impaired: Follows one step commands with increased time     Cueing Cueing Techniques: Verbal cues, Gestural cues, Tactile cues     General Comments General comments (skin integrity, edema, etc.): VSS on 3L O2 via [place].    Exercises     Assessment/Plan    PT Assessment Patient needs continued PT services  PT Problem List Decreased strength;Decreased range of motion;Decreased activity tolerance;Decreased balance;Decreased mobility;Decreased knowledge of use of DME;Decreased safety awareness;Pain       PT Treatment Interventions DME instruction;Functional mobility training;Therapeutic activities;Therapeutic exercise;Balance training;Patient/family education    PT Goals (Current goals can be found in the Care Plan section)  Acute Rehab PT Goals Patient Stated Goal: Return to rehab PT Goal Formulation: With patient Time For Goal Achievement: 11/16/23 Potential to Achieve Goals: Fair    Frequency Min 1X/week     Co-evaluation               AM-PAC PT 6 Clicks Mobility  Outcome Measure Help needed turning from your back to your side while in a flat bed without using bedrails?: Total Help needed moving from lying on your back to sitting on the side of a flat bed without using bedrails?: Total Help needed moving to and from a bed to a chair (including a wheelchair)?: Total  Help needed standing up from a chair using your arms (e.g., wheelchair or bedside chair)?: Total Help needed to walk in hospital room?: Total Help needed climbing 3-5 steps with a railing? : Total 6 Click Score: 6    End of Session Equipment Utilized During Treatment: Gait belt;Oxygen  Activity Tolerance: Patient tolerated treatment well Patient left: in chair;with call bell/phone within reach;with chair alarm set Nurse Communication: Mobility status PT Visit Diagnosis: Muscle weakness (generalized) (M62.81);Other  abnormalities of gait and mobility (R26.89);Pain Pain - Right/Left: Left Pain - part of body: Hip    Time: 8481-8466 PT Time Calculation (min) (ACUTE ONLY): 15 min   Charges:   PT Evaluation $PT Eval Moderate Complexity: 1 Mod   PT General Charges $$ ACUTE PT VISIT: 1 Visit         [name], PT, DPT Acute Rehabilitation Services Office: [phone] Secure Chat Preferred  [name] 11/02/2023, 4:18 PM

## 2023-11-02 NOTE — Anesthesia Postprocedure Evaluation (Signed)
 Anesthesia Post Note  [name]  Procedure(s) Performed: REVISION, ABOVE THE KNEE AMPUTATION, LEFT (Left: Leg Upper)     Patient location during evaluation: PACU Anesthesia Type: General Level of consciousness: awake and alert Pain management: pain level controlled Vital Signs Assessment: post-procedure vital signs reviewed and stable Respiratory status: spontaneous breathing, nonlabored ventilation, respiratory function stable and patient connected to nasal cannula oxygen  Cardiovascular status: blood pressure returned to baseline and stable Postop Assessment: no apparent nausea or vomiting Anesthetic complications: no   No notable events documented.  Last Vitals:  Vitals:   11/02/23 0448 11/02/23 0738  BP: 125/89 (!) 103/55  Pulse: 87 84  Resp: 16 16  Temp: 36.4 C 36.8 C  SpO2: 93% 93%    Last Pain:  Vitals:   11/02/23 0629  TempSrc:   PainSc: 1                  Gailyn Crook S

## 2023-11-02 NOTE — Progress Notes (Signed)
 Patient ID: [name], male   DOB: [birth year], 68 y.o.   MRN: [id number] Patient is postoperative day 1 revision above-knee amputation.  There is 100 cc in the wound VAC canister.  Anticipate discharge back to skilled nursing facility.  There is a prescription for Vicodin on the chart.
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
## 2023-11-02 NOTE — Plan of Care (Signed)
   Problem: Education: Goal: Knowledge of General Education information will improve Description Including pain rating scale, medication(s)/side effects and non-pharmacologic comfort measures Outcome: Progressing   Problem: Health Behavior/Discharge Planning: Goal: Ability to manage health-related needs will improve Outcome: Progressing

## 2023-11-02 NOTE — Consult Note (Signed)
 WOC Nurse Consult Note: Reason for Consult: ear pressure injury Wound type:  unstageable device related pressure injury to L ear, patient utilized oxygen  at home, suspect related to oxygen  cannula usage Pressure Injury POA: Yes Measurement:0.2 cm x 0.2 cm Wound bed: pink, moist, scant yellow slough Drainage (amount, consistency, odor) serous Periwound: intact Dressing procedure/placement/frequency:   Cleanse with NS, pat dry.  Place Xeroform on wound, cover with silicone foam dressing. Change daily.   WOC team will not follow at this time, please re consult if new needs arise.    Thank you,  Doyal Polite, RN, MSN, [facility] WOC Team [phone] (Available Mon-Fri 0700-1500)

## 2023-11-03 LAB — BASIC METABOLIC PANEL WITH GFR
Anion gap: 5 (ref 5–15)
BUN: 15 mg/dL (ref 8–23)
CO2: 41 mmol/L — ABNORMAL HIGH (ref 22–32)
Calcium: 9.2 mg/dL (ref 8.9–10.3)
Chloride: 87 mmol/L — ABNORMAL LOW (ref 98–111)
Creatinine, Ser: 0.43 mg/dL — ABNORMAL LOW (ref 0.61–1.24)
GFR, Estimated: >60 mL/min (ref >60)
Glucose, Bld: 116 mg/dL — ABNORMAL HIGH (ref 70–99)
Potassium: 4.5 mmol/L (ref 3.5–5.1)
Sodium: 133 mmol/L — ABNORMAL LOW (ref 135–145)

## 2023-11-03 LAB — CBC
HCT: 28.5 % — ABNORMAL LOW (ref 39.0–52.0)
Hemoglobin: 9 g/dL — ABNORMAL LOW (ref 13.0–17.0)
MCH: 31 pg (ref 26.0–34.0)
MCHC: 31.6 g/dL (ref 30.0–36.0)
MCV: 98.3 fL (ref 80.0–100.0)
Platelets: 173 K/uL (ref 150–400)
RBC: 2.9 MIL/uL — ABNORMAL LOW (ref 4.22–5.81)
RDW: 12.7 % (ref 11.5–15.5)
WBC: 12.5 K/uL — ABNORMAL HIGH (ref 4.0–10.5)
nRBC: 0 % (ref 0.0–0.2)
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
## 2023-11-03 NOTE — Progress Notes (Signed)
 AVS and Paper script in packet for discharge  floor will continue discharge to SNF

## 2023-11-03 NOTE — Plan of Care (Signed)
  Problem: Education: Goal: Knowledge of General Education information will improve Description: Including pain rating scale, medication(s)/side effects and non-pharmacologic comfort measures Outcome: Progressing   Problem: Health Behavior/Discharge Planning: Goal: Ability to manage health-related needs will improve Outcome: Progressing   Problem: Clinical Measurements: Goal: Ability to maintain clinical measurements within normal limits will improve Outcome: Progressing Goal: Will remain free from infection Outcome: Progressing Goal: Diagnostic test results will improve Outcome: Progressing Goal: Respiratory complications will improve Outcome: Progressing Goal: Cardiovascular complication will be avoided Outcome: Progressing   Problem: Activity: Goal: Risk for activity intolerance will decrease Outcome: Progressing   Problem: Nutrition: Goal: Adequate nutrition will be maintained Outcome: Progressing   Problem: Coping: Goal: Level of anxiety will decrease Outcome: Progressing   Problem: Elimination: Goal: Will not experience complications related to bowel motility Outcome: Progressing Goal: Will not experience complications related to urinary retention Outcome: Progressing   Problem: Pain Managment: Goal: General experience of comfort will improve and/or be controlled Outcome: Progressing   Problem: Safety: Goal: Ability to remain free from injury will improve Outcome: Progressing   Problem: Skin Integrity: Goal: Risk for impaired skin integrity will decrease Outcome: Progressing   Problem: Education: Goal: Knowledge of the prescribed therapeutic regimen will improve Outcome: Progressing Goal: Ability to verbalize activity precautions or restrictions will improve Outcome: Progressing Goal: Understanding of discharge needs will improve Outcome: Progressing   Problem: Activity: Goal: Ability to perform//tolerate increased activity and mobilize with assistive  devices will improve Outcome: Progressing   Problem: Clinical Measurements: Goal: Postoperative complications will be avoided or minimized Outcome: Progressing   Problem: Self-Care: Goal: Ability to meet self-care needs will improve Outcome: Progressing   Problem: Self-Concept: Goal: Ability to maintain and perform role responsibilities to the fullest extent possible will improve Outcome: Progressing   Problem: Pain Management: Goal: Pain level will decrease with appropriate interventions Outcome: Progressing   Problem: Skin Integrity: Goal: Demonstration of wound healing without infection will improve Outcome: Progressing

## 2023-11-03 NOTE — TOC Transition Note (Signed)
 Transition of Care [facility]) - Discharge Note   Patient Details  Name: [name] MRN: [id number] Date of Birth: Oct 09, 1955  Transition of Care [facility]) CM/SW Contact:  Luann SHAUNNA Cumming, LCSW Phone Number: 11/03/2023, 11:05 AM   Clinical Narrative:      Pt to DC back to his LTC facility, [facility]. RN, patient, patient's family, and facility notified of DC. Discharge Summary and FL2 sent to facility. RN to call report prior to discharge [phone]). DC packet on chart. Ambulance transport requested for patient.   CSW will sign off for now as social work intervention is no longer needed. Please consult us  again if new needs arise.   Final next level of care: Skilled Nursing Facility Barriers to Discharge: No Barriers Identified     Discharge Placement              Patient chooses bed at:  [facility]) Patient to be transferred to facility by: [facility] Name of family member notified: Left voicemail with [name], [name] (Daughter)  [phone] (Mobile) Patient and family notified of of transfer: 11/03/23  Discharge Plan and Services Additional resources added to the After Visit Summary for                                       Social Drivers of Health (SDOH) Interventions SDOH Screenings   Food Insecurity: No Food Insecurity (11/01/2023)  Housing: Low Risk  (11/01/2023)  Transportation Needs: No Transportation Needs (11/01/2023)  Utilities: Not At Risk (11/01/2023)  Depression (PHQ2-9): Low Risk  (07/22/2019)  Social Connections: Unknown (11/01/2023)  Recent Concern: Social Connections - Socially Isolated (09/05/2023)  Tobacco Use: High Risk (11/01/2023)     Readmission Risk Interventions    09/07/2023    2:55 PM 09/06/2023    9:14 AM 08/02/2022   12:27 PM  Readmission Risk Prevention Plan  Post Dischage Appt   Complete  Medication Screening   Complete  Transportation Screening Complete Complete Complete  PCP or Specialist Appt within 5-7 Days Complete  Complete   Home Care Screening Complete Complete   Medication Review (RN CM) Complete Complete

## 2023-11-03 NOTE — Progress Notes (Signed)
 DISCHARGE NOTE [facility] to be discharged Skilled nursing facility  [facility] per MD order. Patient verbalized understanding.  Skin clean, dry and intact without evidence of skin break down, no evidence of skin tears noted. IV catheter discontinued intact. Site without signs and symptoms of complications. Dressing and pressure applied. Pt denies pain at the site currently. No complaints noted.  Discharging with negative pressure therapy to left leg  disconnected  for transport  facility to reconnect. Patient free of lines, drains, and wounds.   Discharge packet assembled. An After Visit Summary (AVS) was printed and given to the EMS personnel. Patient escorted via stretcher and discharged to [facility] via ambulance. RN attempted to call report  to accepting facility VM left;    Peyton SHAUNNA Pepper, RN

## 2023-11-03 NOTE — Care Management Important Message (Signed)
 Important Message  Patient Details  Name: [name] MRN: [id number] Date of Birth: [birth year]   Important Message Given:  Yes - Medicare IM     Jon Cruel 11/03/2023, 3:42 PM

## 2023-11-03 NOTE — Final Progress Note (Signed)
 Report given to Demetria at [facility].

## 2023-11-03 NOTE — Progress Notes (Signed)
 Attempted to call report to [facility]. No answer, VM left with call back number.

## 2023-11-06 ENCOUNTER — Telehealth: Payer: Self-pay

## 2023-11-06 NOTE — Telephone Encounter (Signed)
 Earnie, nurse from [facility], called to get wound vac orders. 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 Please call Earnie or the treatment nurse, back with those orders. #[phone].

## 2023-11-07 NOTE — Telephone Encounter (Signed)
 I called and sw nurse to advise to leave wound vac intact until post op appt Friday. If the vac shows sign of malfunction can remove and apply 4x4, kerlix and ace and change this daily until his appt.

## 2023-11-10 ENCOUNTER — Encounter: Admitting: Physician Assistant

## 2023-11-13 ENCOUNTER — Encounter: Admitting: Physician Assistant

## 2023-11-14 DIAGNOSIS — M8660 Other chronic osteomyelitis, unspecified site: Secondary | ICD-10-CM | POA: Insufficient documentation

## 2023-11-28 ENCOUNTER — Ambulatory Visit (INDEPENDENT_AMBULATORY_CARE_PROVIDER_SITE_OTHER): Admitting: Physician Assistant

## 2023-11-28 DIAGNOSIS — T879 Unspecified complications of amputation stump: Secondary | ICD-10-CM

## 2023-11-29 ENCOUNTER — Encounter: Payer: Self-pay | Admitting: Physician Assistant
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
## 2023-12-13 ENCOUNTER — Telehealth: Payer: Self-pay | Admitting: Orthopedic Surgery

## 2023-12-13 NOTE — Telephone Encounter (Signed)
 Rohan PT called and needs to know if he still have weight barring. CB#[phone]

## 2023-12-14 NOTE — Telephone Encounter (Signed)
 Lm on vm of below information

## 2023-12-26 ENCOUNTER — Encounter: Admitting: Physician Assistant

## 2024-01-01 ENCOUNTER — Ambulatory Visit: Admitting: Physician Assistant

## 2024-01-01 ENCOUNTER — Encounter: Payer: Self-pay | Admitting: Physician Assistant

## 2024-01-01 DIAGNOSIS — Z89612 Acquired absence of left leg above knee: Secondary | ICD-10-CM

## 2024-01-01 DIAGNOSIS — S78112A Complete traumatic amputation at level between left hip and knee, initial encounter: Secondary | ICD-10-CM
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
